# Patient Record
Sex: Female | Born: 1965
Health system: Southern US, Community
[De-identification: ages and names within clinical notes are randomized; demographics above are authoritative.]

## PROBLEM LIST (undated history)

## (undated) DIAGNOSIS — N289 Disorder of kidney and ureter, unspecified: Secondary | ICD-10-CM

## (undated) DIAGNOSIS — G709 Myoneural disorder, unspecified: Secondary | ICD-10-CM

## (undated) DIAGNOSIS — R5383 Other fatigue: Secondary | ICD-10-CM

## (undated) DIAGNOSIS — M797 Fibromyalgia: Secondary | ICD-10-CM

## (undated) DIAGNOSIS — I10 Essential (primary) hypertension: Secondary | ICD-10-CM

## (undated) DIAGNOSIS — Z91018 Allergy to other foods: Secondary | ICD-10-CM

## (undated) DIAGNOSIS — F32A Depression, unspecified: Secondary | ICD-10-CM

## (undated) DIAGNOSIS — F0390 Unspecified dementia without behavioral disturbance: Secondary | ICD-10-CM

## (undated) DIAGNOSIS — G4730 Sleep apnea, unspecified: Secondary | ICD-10-CM

## (undated) DIAGNOSIS — M1990 Unspecified osteoarthritis, unspecified site: Secondary | ICD-10-CM

## (undated) DIAGNOSIS — M2550 Pain in unspecified joint: Secondary | ICD-10-CM

## (undated) DIAGNOSIS — M069 Rheumatoid arthritis, unspecified: Secondary | ICD-10-CM

## (undated) DIAGNOSIS — F419 Anxiety disorder, unspecified: Secondary | ICD-10-CM

## (undated) DIAGNOSIS — M549 Dorsalgia, unspecified: Secondary | ICD-10-CM

## (undated) HISTORY — DX: Sleep apnea, unspecified: G47.30

## (undated) HISTORY — DX: Pain in unspecified joint: M25.50

## (undated) HISTORY — PX: HAND SURGERY: SHX662

## (undated) HISTORY — DX: Depression, unspecified: F32.A

## (undated) HISTORY — DX: Unspecified osteoarthritis, unspecified site: M19.90

## (undated) HISTORY — DX: Unspecified dementia, unspecified severity, without behavioral disturbance, psychotic disturbance, mood disturbance, and anxiety: F03.90

## (undated) HISTORY — PX: GANGLION CYST EXCISION: SHX1691

## (undated) HISTORY — PX: HIP SURGERY: SHX245

## (undated) HISTORY — DX: Myoneural disorder, unspecified: G70.9

## (undated) HISTORY — PX: GASTRIC BYPASS: SHX52

## (undated) HISTORY — DX: Anxiety disorder, unspecified: F41.9

## (undated) HISTORY — PX: TUBAL LIGATION: SHX77

## (undated) HISTORY — DX: Other fatigue: R53.83

## (undated) HISTORY — DX: Allergy to other foods: Z91.018

## (undated) HISTORY — DX: Dorsalgia, unspecified: M54.9

## (undated) HISTORY — DX: Rheumatoid arthritis, unspecified: M06.9

## (undated) HISTORY — DX: Disorder of kidney and ureter, unspecified: N28.9

## (undated) HISTORY — PX: DILATION AND CURETTAGE OF UTERUS: SHX78

## (undated) HISTORY — DX: Essential (primary) hypertension: I10

---

## 2005-03-30 HISTORY — PX: CARPAL TUNNEL RELEASE: SHX101

## 2007-03-31 HISTORY — PX: INCONTINENCE SURGERY: SHX676

## 2011-03-22 ENCOUNTER — Ambulatory Visit: Payer: Self-pay | Admitting: Internal Medicine

## 2011-06-25 ENCOUNTER — Ambulatory Visit: Payer: Self-pay

## 2011-07-29 ENCOUNTER — Ambulatory Visit: Payer: Self-pay

## 2012-01-21 ENCOUNTER — Other Ambulatory Visit: Payer: Self-pay | Admitting: Unknown Physician Specialty

## 2012-01-28 LAB — WOUND CULTURE

## 2012-06-19 ENCOUNTER — Emergency Department: Payer: Self-pay | Admitting: Emergency Medicine

## 2012-06-19 LAB — COMPREHENSIVE METABOLIC PANEL
Albumin: 3.3 g/dL — ABNORMAL LOW (ref 3.4–5.0)
Bilirubin,Total: 0.4 mg/dL (ref 0.2–1.0)
Chloride: 103 mmol/L (ref 98–107)
Creatinine: 0.77 mg/dL (ref 0.60–1.30)
EGFR (Non-African Amer.): >60
Osmolality: 271 (ref 275–301)

## 2012-06-19 LAB — CBC
HCT: 41.1 % (ref 35.0–47.0)
MCH: 29.5 pg (ref 26.0–34.0)
MCHC: 34.2 g/dL (ref 32.0–36.0)
MCV: 86 fL (ref 80–100)
Platelet: 322 10*3/uL (ref 150–440)
RBC: 4.77 10*6/uL (ref 3.80–5.20)
RDW: 13.5 % (ref 11.5–14.5)
WBC: 9.6 10*3/uL (ref 3.6–11.0)

## 2012-06-19 LAB — LIPASE, BLOOD: Lipase: 82 U/L (ref 73–393)

## 2014-09-10 DIAGNOSIS — M150 Primary generalized (osteo)arthritis: Secondary | ICD-10-CM | POA: Insufficient documentation

## 2014-09-10 DIAGNOSIS — Z720 Tobacco use: Secondary | ICD-10-CM

## 2014-09-10 DIAGNOSIS — F17210 Nicotine dependence, cigarettes, uncomplicated: Secondary | ICD-10-CM | POA: Insufficient documentation

## 2014-09-10 DIAGNOSIS — K589 Irritable bowel syndrome without diarrhea: Secondary | ICD-10-CM | POA: Insufficient documentation

## 2014-09-10 DIAGNOSIS — M797 Fibromyalgia: Secondary | ICD-10-CM | POA: Insufficient documentation

## 2014-09-10 DIAGNOSIS — G4733 Obstructive sleep apnea (adult) (pediatric): Secondary | ICD-10-CM | POA: Insufficient documentation

## 2014-09-10 DIAGNOSIS — G894 Chronic pain syndrome: Secondary | ICD-10-CM | POA: Insufficient documentation

## 2014-09-10 DIAGNOSIS — I159 Secondary hypertension, unspecified: Secondary | ICD-10-CM

## 2014-09-10 DIAGNOSIS — I10 Essential (primary) hypertension: Secondary | ICD-10-CM | POA: Insufficient documentation

## 2014-09-10 DIAGNOSIS — F419 Anxiety disorder, unspecified: Secondary | ICD-10-CM | POA: Insufficient documentation

## 2014-09-11 ENCOUNTER — Ambulatory Visit (INDEPENDENT_AMBULATORY_CARE_PROVIDER_SITE_OTHER): Payer: BLUE CROSS/BLUE SHIELD | Admitting: Unknown Physician Specialty

## 2014-09-11 ENCOUNTER — Other Ambulatory Visit: Payer: Self-pay | Admitting: Unknown Physician Specialty

## 2014-09-11 ENCOUNTER — Encounter: Payer: Self-pay | Admitting: Unknown Physician Specialty

## 2014-09-11 VITALS — BP 141/83 | HR 103 | Temp 98.0°F | Ht 69.1 in | Wt 370.0 lb

## 2014-09-11 DIAGNOSIS — Z720 Tobacco use: Secondary | ICD-10-CM

## 2014-09-11 DIAGNOSIS — G4733 Obstructive sleep apnea (adult) (pediatric): Secondary | ICD-10-CM | POA: Diagnosis not present

## 2014-09-11 DIAGNOSIS — M797 Fibromyalgia: Secondary | ICD-10-CM

## 2014-09-11 DIAGNOSIS — R220 Localized swelling, mass and lump, head: Secondary | ICD-10-CM

## 2014-09-11 DIAGNOSIS — G4730 Sleep apnea, unspecified: Secondary | ICD-10-CM

## 2014-09-11 DIAGNOSIS — E668 Other obesity: Secondary | ICD-10-CM

## 2014-09-11 DIAGNOSIS — E669 Obesity, unspecified: Secondary | ICD-10-CM

## 2014-09-11 DIAGNOSIS — R0602 Shortness of breath: Secondary | ICD-10-CM | POA: Diagnosis not present

## 2014-09-11 MED ORDER — CYANOCOBALAMIN 1000 MCG/ML IJ SOLN
1000.0000 ug | Freq: Once | INTRAMUSCULAR | Status: AC
  Administered 2014-09-11: 1000 ug via INTRAMUSCULAR

## 2014-09-11 MED ORDER — CELECOXIB 200 MG PO CAPS: 200.0000 mg | ORAL_CAPSULE | Freq: Two times a day (BID) | ORAL | Status: DC

## 2014-09-11 MED ORDER — ALBUTEROL SULFATE HFA 108 (90 BASE) MCG/ACT IN AERS: 2.0000 | INHALATION_SPRAY | Freq: Four times a day (QID) | RESPIRATORY_TRACT | Status: DC | PRN

## 2014-09-11 NOTE — Assessment & Plan Note (Signed)
Unsuccessful with Chantix.  Quit smoking information given including a free class

## 2014-09-11 NOTE — Patient Instructions (Addendum)
Sleep Apnea  Sleep apnea is a sleep disorder characterized by abnormal pauses in breathing while you sleep. When your breathing pauses, the level of oxygen in your blood decreases. This causes you to move out of deep sleep and into light sleep. As a result, your quality of sleep is poor, and the system that carries your blood throughout your body (cardiovascular system) experiences stress. If sleep apnea remains untreated, the following conditions can develop:  High blood pressure (hypertension).  Coronary artery disease.  Inability to achieve or maintain an erection (impotence).  Impairment of your thought process (cognitive dysfunction). There are three types of sleep apnea:  Obstructive sleep apnea--Pauses in breathing during sleep because of a blocked airway.  Central sleep apnea--Pauses in breathing during sleep because the area of the brain that controls your breathing does not send the correct signals to the muscles that control breathing.  Mixed sleep apnea--A combination of both obstructive and central sleep apnea. RISK FACTORS The following risk factors can increase your risk of developing sleep apnea:  Being overweight.  Smoking.  Having narrow passages in your nose and throat.  Being of older age.  Being female.  Alcohol use.  Sedative and tranquilizer use.  Ethnicity. Among individuals younger than 35 years, African Americans are at increased risk of sleep apnea. SYMPTOMS   Difficulty staying asleep.  Daytime sleepiness and fatigue.  Loss of energy.  Irritability.  Loud, heavy snoring.  Morning headaches.  Trouble concentrating.  Forgetfulness.  Decreased interest in sex. DIAGNOSIS  In order to diagnose sleep apnea, your caregiver will perform a physical examination. Your caregiver may suggest that you take a home sleep test. Your caregiver may also recommend that you spend the night in a sleep lab. In the sleep lab, several monitors record  information about your heart, lungs, and brain while you sleep. Your leg and arm movements and blood oxygen level are also recorded. TREATMENT The following actions may help to resolve mild sleep apnea:  Sleeping on your side.   Using a decongestant if you have nasal congestion.   Avoiding the use of depressants, including alcohol, sedatives, and narcotics.   Losing weight and modifying your diet if you are overweight. There also are devices and treatments to help open your airway:  Oral appliances. These are custom-made mouthpieces that shift your lower jaw forward and slightly open your bite. This opens your airway.  Devices that create positive airway pressure. This positive pressure "splints" your airway open to help you breathe better during sleep. The following devices create positive airway pressure:  Continuous positive airway pressure (CPAP) device. The CPAP device creates a continuous level of air pressure with an air pump. The air is delivered to your airway through a mask while you sleep. This continuous pressure keeps your airway open.  Nasal expiratory positive airway pressure (EPAP) device. The EPAP device creates positive air pressure as you exhale. The device consists of single-use valves, which are inserted into each nostril and held in place by adhesive. The valves create very little resistance when you inhale but create much more resistance when you exhale. That increased resistance creates the positive airway pressure. This positive pressure while you exhale keeps your airway open, making it easier to breath when you inhale again.  Bilevel positive airway pressure (BPAP) device. The BPAP device is used mainly in patients with central sleep apnea. This device is similar to the CPAP device because it also uses an air pump to deliver continuous air pressure  through a mask. However, with the BPAP machine, the pressure is set at two different levels. The pressure when you  exhale is lower than the pressure when you inhale.  Surgery. Typically, surgery is only done if you cannot comply with less invasive treatments or if the less invasive treatments do not improve your condition. Surgery involves removing excess tissue in your airway to create a wider passage way. Document Released: 03/06/2002 Document Revised: 07/11/2012 Document Reviewed: 07/23/2011 [facility] Patient Information 2015 [place], [place]. This information is not intended to replace advice given to you by your health care provider. Make sure you discuss any questions you have with your health care provider. Smoking Cessation Quitting smoking is important to your health and has many advantages. However, it is not always easy to quit since nicotine is a very addictive drug. Oftentimes, people try 3 times or more before being able to quit. This document explains the best ways for you to prepare to quit smoking. Quitting takes hard work and a lot of effort, but you can do it. ADVANTAGES OF QUITTING SMOKING  You will live longer, feel better, and live better.  Your body will feel the impact of quitting smoking almost immediately.  Within 20 minutes, blood pressure decreases. Your pulse returns to its normal level.  After 8 hours, carbon monoxide levels in the blood return to normal. Your oxygen level increases.  After 24 hours, the chance of having a heart attack starts to decrease. Your breath, hair, and body stop smelling like smoke.  After 48 hours, damaged nerve endings begin to recover. Your sense of taste and smell improve.  After 72 hours, the body is virtually free of nicotine. Your bronchial tubes relax and breathing becomes easier.  After 2 to 12 weeks, lungs can hold more air. Exercise becomes easier and circulation improves.  The risk of having a heart attack, stroke, cancer, or lung disease is greatly reduced.  After 1 year, the risk of coronary heart disease is cut in half.  After 5  years, the risk of stroke falls to the same as a nonsmoker.  After 10 years, the risk of lung cancer is cut in half and the risk of other cancers decreases significantly.  After 15 years, the risk of coronary heart disease drops, usually to the level of a nonsmoker.  If you are pregnant, quitting smoking will improve your chances of having a healthy baby.  The people you live with, especially any children, will be healthier.  You will have extra money to spend on things other than cigarettes. QUESTIONS TO THINK ABOUT BEFORE ATTEMPTING TO QUIT You may want to talk about your answers with your health care provider.  Why do you want to quit?  If you tried to quit in the past, what helped and what did not?  What will be the most difficult situations for you after you quit? How will you plan to handle them?  Who can help you through the tough times? Your family? Friends? A health care provider?  What pleasures do you get from smoking? What ways can you still get pleasure if you quit? Here are some questions to ask your health care provider:  How can you help me to be successful at quitting?  What medicine do you think would be best for me and how should I take it?  What should I do if I need more help?  What is smoking withdrawal like? How can I get information on withdrawal? GET READY  Set a quit date.  Change your environment by getting rid of all cigarettes, ashtrays, matches, and lighters in your home, car, or work. Do not let people smoke in your home.  Review your past attempts to quit. Think about what worked and what did not. GET SUPPORT AND ENCOURAGEMENT You have a better chance of being successful if you have help. You can get support in many ways.  Tell your family, friends, and coworkers that you are going to quit and need their support. Ask them not to smoke around you.  Get individual, group, or telephone counseling and support. Programs are available at [employer] and health centers. Call your local health department for information about programs in your area.  Spiritual beliefs and practices may help some smokers quit.  Download a "quit meter" on your computer to keep track of quit statistics, such as how long you have gone without smoking, cigarettes not smoked, and money saved.  Get a self-help book about quitting smoking and staying off tobacco. [place] yourself from urges to smoke. Talk to someone, go for a walk, or occupy your time with a task.  Change your normal routine. Take a different route to work. Drink tea instead of coffee. Eat breakfast in a different place.  Reduce your stress. Take a hot bath, exercise, or read a book.  Plan something enjoyable to do every day. Reward yourself for not smoking.  Explore interactive web-based programs that specialize in helping you quit. GET MEDICINE AND USE IT CORRECTLY Medicines can help you stop smoking and decrease the urge to smoke. Combining medicine with the above behavioral methods and support can greatly increase your chances of successfully quitting smoking.  Nicotine replacement therapy helps deliver nicotine to your body without the negative effects and risks of smoking. Nicotine replacement therapy includes nicotine gum, lozenges, inhalers, nasal sprays, and skin patches. Some may be available over-the-counter and others require a prescription.  Antidepressant medicine helps people abstain from smoking, but how this works is unknown. This medicine is available by prescription.  Nicotinic receptor partial agonist medicine simulates the effect of nicotine in your brain. This medicine is available by prescription. Ask your health care provider for advice about which medicines to use and how to use them based on your health history. Your health care provider will tell you what side effects to look out for if you choose to be on a medicine or  therapy. Carefully read the information on the package. Do not use any other product containing nicotine while using a nicotine replacement product.  RELAPSE OR DIFFICULT SITUATIONS Most relapses occur within the first 3 months after quitting. Do not be discouraged if you start smoking again. Remember, most people try several times before finally quitting. You may have symptoms of withdrawal because your body is used to nicotine. You may crave cigarettes, be irritable, feel very hungry, cough often, get headaches, or have difficulty concentrating. The withdrawal symptoms are only temporary. They are strongest when you first quit, but they will go away within 10-14 days. To reduce the chances of relapse, try to:  Avoid drinking alcohol. Drinking lowers your chances of successfully quitting.  Reduce the amount of caffeine you consume. Once you quit smoking, the amount of caffeine in your body increases and can give you symptoms, such as a rapid heartbeat, sweating, and anxiety.  Avoid smokers because they can make you want to smoke.  Do not let weight gain distract  you. Many smokers will gain weight when they quit, usually less than 10 pounds. Eat a healthy diet and stay active. You can always lose the weight gained after you quit.  Find ways to improve your mood other than smoking. FOR MORE INFORMATION  www.smokefree.gov  Document Released: 03/10/2001 Document Revised: 07/31/2013 Document Reviewed: 06/25/2011 [facility] Patient Information 2015 [place], [place]. This information is not intended to replace advice given to you by your health care provider. Make sure you discuss any questions you have with your health care provider.  We are referring you to ENT, Bariatric surgery, and for a sleep study

## 2014-09-11 NOTE — Assessment & Plan Note (Signed)
Never did get her sleep study due to expense.  Discussed with patient this is necessary, especially with extent of symptms

## 2014-09-11 NOTE — Assessment & Plan Note (Signed)
Referral given to bariatric surgery

## 2014-09-11 NOTE — Assessment & Plan Note (Addendum)
Stable, but out of Celebrex.  Will refill.  Encouraged to only take one a day

## 2014-09-11 NOTE — Progress Notes (Signed)
BP 141/83 mmHg  Pulse 103  Temp(Src) 98 F (36.7 C)  Ht 5' 9.1" (1.755 m)  Wt 370 lb (167.831 kg)  BMI 54.49 kg/m2  SpO2 95%  LMP  (LMP Unknown)   Subjective:    Patient ID: Cathy Mitchell, female    DOB: 1965/11/18, 49 y.o.   MRN: [id number]  HPI: Cathy Mitchell is a 49 y.o. female  Chief Complaint  Patient presents with  . Mass    pt states lump is on the left side of her face  . Sore Throat    pt states throat was not very sore, but it "collapsed" in the middle of the ight to where she couldn't breathe.   Pt states she has a "sebaceous cyst" on the left side of her face.  States it has been there for about a week and quickly getting bigger.    Denies pain.    Throat collapse - Pt states she woke up in the middle of the night and felt her throat "collapse"  She is a stomach sleeper.  She does snore at night.  Does not feel rested in the morning.  She does sit herself up at night so her throat doesn't collapse.    Fibromyalgia is stable except "tornado season."  She is our of Cymbalta which seems to help with her problem above.    Nicotine Dependence Presents for follow-up (Chantix helps.  But can't remember to take the second pill) visit. Her urge triggers include company of smokers.     Relevant past medical, surgical, family and social history reviewed and updated as indicated. Interim medical history since our last visit reviewed. Allergies and medications reviewed and updated.  Review of Systems  Constitutional: Negative.   HENT: Positive for trouble swallowing.   Respiratory: Positive for apnea, choking, shortness of breath and wheezing.        "I know smoking is killing me"  Cardiovascular: Negative.   Gastrointestinal: Negative.   Endocrine: Negative.   Genitourinary: Negative.     Per HPI unless specifically indicated above     Objective:    BP 141/83 mmHg  Pulse 103  Temp(Src) 98 F (36.7 C)  Ht 5' 9.1" (1.755 m)  Wt 370 lb (167.831 kg)  BMI  54.49 kg/m2  SpO2 95%  LMP  (LMP Unknown)  Wt Readings from Last 3 Encounters:  09/11/14 370 lb (167.831 kg)  08/29/13 352 lb (159.666 kg)    Physical Exam  Constitutional: She is oriented to person, place, and time. She appears well-developed and well-nourished. No distress.  HENT:  Head: Normocephalic and atraumatic.  I can appreciate a dime sized area that is fixed and soft on left side of face  Eyes: Conjunctivae and lids are normal. Right eye exhibits no discharge. Left eye exhibits no discharge. No scleral icterus.  Cardiovascular: Normal rate and regular rhythm.   Pulmonary/Chest: Effort normal. No respiratory distress. She has wheezes.  Generalized wheezing throughout  Abdominal: Normal appearance and bowel sounds are normal. She exhibits no distension. There is no splenomegaly or hepatomegaly. There is no tenderness.  Musculoskeletal: Normal range of motion.  Neurological: She is alert and oriented to person, place, and time.  Skin: Skin is intact. No rash noted. No pallor.  Psychiatric: She has a normal mood and affect. Her behavior is normal. Judgment and thought content normal.        Assessment & Plan:   Problem List Items Addressed This Visit  Respiratory   Obstructive sleep apnea    Never did get her sleep study due to expense.  Discussed with patient this is necessary, especially with extent of symptms        Musculoskeletal and Integument   Fibromyalgia - Primary    Stable, but out of Celebrex.  Will refill.  Encouraged to only take one a day      Relevant Medications   cyanocobalamin ((VITAMIN B-12)) injection 1,000 mcg (Completed)   celecoxib (CELEBREX) 200 MG capsule     Other   Tobacco abuse    Unsuccessful with Chantix.  Quit smoking information given including a free class      Relevant Orders   Spirometry with Graph   Sleep apnea   Relevant Orders   Nocturnal polysomnography (NPSG)   Extreme obesity    Referral given to bariatric  surgery      Relevant Orders   Ambulatory referral to General Surgery    Other Visit Diagnoses    Swelling, mass, or lump on face        Refer to ENT for managment due to location of area.      Relevant Orders    Nocturnal polysomnography (NPSG)    Ambulatory referral to ENT    SOB (shortness of breath)        With generalized wheezing.  spirometry normal.  Give Ventolin inhaler for wheezing.      Relevant Orders    Spirometry with Graph        Follow up plan: No Follow-up on file.

## 2014-10-15 ENCOUNTER — Ambulatory Visit (INDEPENDENT_AMBULATORY_CARE_PROVIDER_SITE_OTHER): Payer: BLUE CROSS/BLUE SHIELD | Admitting: Unknown Physician Specialty

## 2014-10-15 ENCOUNTER — Encounter: Payer: Self-pay | Admitting: Unknown Physician Specialty

## 2014-10-15 VITALS — BP 142/85 | HR 88 | Temp 97.7°F | Ht 68.6 in | Wt 368.0 lb

## 2014-10-15 DIAGNOSIS — N181 Chronic kidney disease, stage 1: Secondary | ICD-10-CM | POA: Diagnosis not present

## 2014-10-15 DIAGNOSIS — M797 Fibromyalgia: Secondary | ICD-10-CM

## 2014-10-15 DIAGNOSIS — I10 Essential (primary) hypertension: Secondary | ICD-10-CM | POA: Diagnosis not present

## 2014-10-15 DIAGNOSIS — Z5181 Encounter for therapeutic drug level monitoring: Secondary | ICD-10-CM

## 2014-10-15 DIAGNOSIS — R809 Proteinuria, unspecified: Secondary | ICD-10-CM | POA: Diagnosis not present

## 2014-10-15 DIAGNOSIS — R7301 Impaired fasting glucose: Secondary | ICD-10-CM | POA: Diagnosis not present

## 2014-10-15 DIAGNOSIS — E668 Other obesity: Secondary | ICD-10-CM

## 2014-10-15 LAB — MICROALBUMIN, URINE WAIVED
CREATININE, URINE WAIVED: 300 mg/dL (ref 10–300)
Microalb, Ur Waived: 80 mg/L — ABNORMAL HIGH (ref 0–19)

## 2014-10-15 LAB — BAYER DCA HB A1C WAIVED: HB A1C (BAYER DCA - WAIVED): 5.7 % (ref <7.0)

## 2014-10-15 NOTE — Assessment & Plan Note (Addendum)
Discussed microalbumin of 80.  Creatnine pending.  Discussed NSAID use.  Given handout on "alternatives to chronic pain."

## 2014-10-15 NOTE — Progress Notes (Signed)
BP 142/85 mmHg  Pulse 88  Temp(Src) 97.7 F (36.5 C)  Ht 5' 8.6" (1.742 m)  Wt 368 lb (166.924 kg)  BMI 55.01 kg/m2  SpO2 96%   Subjective:    Patient ID: Cathy Mitchell, female    DOB: 04-26-1965, 49 y.o.   MRN: [id number]  HPI: Cathy Mitchell is a 49 y.o. female  Chief Complaint  Patient presents with  . Fibromyalgia    Relevant past medical, surgical, family and social history reviewed and updated as indicated. Interim medical history since our last visit reviewed. Allergies and medications reviewed and updated.  Obesity Pt is concerned she might have diabetes.  She is getting headaches and started drinking "pop." Her weight is up at 368.  She did have a blood sugar of 112 last visit.  She is trying to continue with her organic diet but states she is "half and half."  She finds that her organic diet is lacking of variety and flavor.    Fibromyalgia Stable for now.  She is on chonic NSAIDS and needs CMP.  She does nave chronic fatigue.    IBS This is dependent on diet.    Review of Systems  Per HPI unless specifically indicated above     Objective:    BP 142/85 mmHg  Pulse 88  Temp(Src) 97.7 F (36.5 C)  Ht 5' 8.6" (1.742 m)  Wt 368 lb (166.924 kg)  BMI 55.01 kg/m2  SpO2 96%  Wt Readings from Last 3 Encounters:  10/15/14 368 lb (166.924 kg)  09/11/14 370 lb (167.831 kg)  08/29/13 352 lb (159.666 kg)    Physical Exam  Constitutional: She is oriented to person, place, and time. She appears well-developed and well-nourished. No distress.  HENT:  Head: Normocephalic and atraumatic.  Eyes: Conjunctivae and lids are normal. Right eye exhibits no discharge. Left eye exhibits no discharge. No scleral icterus.  Cardiovascular: Normal rate, regular rhythm and normal heart sounds.   Pulmonary/Chest: Effort normal and breath sounds normal. No respiratory distress.  Abdominal: Normal appearance. There is no splenomegaly or hepatomegaly.  Musculoskeletal: Normal  range of motion.  Neurological: She is alert and oriented to person, place, and time.  Skin: Skin is intact. No rash noted. No pallor.  Psychiatric: She has a normal mood and affect. Her behavior is normal. Judgment and thought content normal.     Assessment & Plan:   Problem List Items Addressed This Visit      Cardiovascular and Mediastinum   High blood pressure     Musculoskeletal and Integument   Fibromyalgia - Primary    Stable for now.  Taking chronic NSAIDS.  Check CMP        Genitourinary   Chronic kidney disease, stage 1, normal or increased GFR    Discussed microalbumin of 80.  Creatnine pending.  Discussed NSAID use.  Given handout on "alternatives to chronic pain."        Other   Extreme obesity    Appointment with bariatric surgeon.  Check Glucose and Hgb A1C.        Relevant Orders   Bayer Dexter Hb A1c Waived    Other Visit Diagnoses    Impaired fasting glucose        Hgb A1C is 5.7.  Discussed with patient.      Relevant Orders    Bayer DCA Hb A1c Waived    Medication monitoring encounter        Relevant Orders  Comprehensive metabolic panel    Microalbumin, Urine Waived    Microalbuminuria        discussed NSAID use.           Follow up plan: Return in about 2 months (around 12/16/2014) for for PE .

## 2014-10-15 NOTE — Assessment & Plan Note (Signed)
Appointment with bariatric surgeon.  Check Glucose and Hgb A1C.

## 2014-10-15 NOTE — Patient Instructions (Signed)
f you have chronic pain and are looking for alternatives to medication and surgery, you have a lot of options.   However, not all alternative pain treatments work. Some can even be risky. Some alternative treatments may help with pain from bad backs, osteoarthritis, and headaches, but have no effect on chronic pain from fibromyalgia or diabetic nerve damage.  Here's a rundown of the most commonly used alternative treatments for chronic pain.  Acupuncture. Once seen as bizarre, acupuncture is rapidly becoming a mainstream treatment for pain. Studies have found that it works for pain caused by many conditions, including fibromyalgia, osteoarthritis, back injuries, and sports injuries. How does it work? No one's quite sure. It could release pain-numbing chemicals in the body. Or it might block the pain signals coming from the nerves.  Exercise. Motion is lotion Going for a walk or swim isn't a treatment, exactly. But regular physical activity has big benefits for people with many different painful conditions. Study after study has found that physical activity can help relieve chronic pain, as well as boost energy and mood.   This is particularly true of pain related to arthritis .    Chiropractic manipulation. Although mainstream medicine has traditionally regarded spinal manipulation with suspicion, it's becoming a more accepted treatment.  I think many people respond will th chiropractic treatment.    Supplements and vitamins. There is evidence that certain dietary supplements and vitamins can help with certain types of pain. Fish oil and flaxseed oil is often used to reduce pain associated with swelling. Topical capsaicin, derived from chili peppers, may help with arthritis, diabetic nerve pain, and other conditions. There's evidence that glucosamine can help relieve moderate to severe pain from osteoarthritis in the knee.  A spice Tumeric is used my many to treat chronic pain.  Curcumin is the active  ingredient and thought to have anti-inflammatory effects and reduces pain and stiffness.  This can be found as capsules or extract (more likely to be free of contaminants). For OA: Capsule, typically 400 mg to 600 mg, three times per day; or 0.5 g to 1 g of powdered root up to 3 g per day. For RA: 500 mg twice daily.  It's best absorbed if it contains black pepper.  Often tart cherry juice works.    But when it comes to supplements, you have to be careful. High doses of B6 can damage the nerves.   Some studies suggest that supplements such as ginkgo biloba and ginseng can thin the blood and increase the risk of bleeding. This could lead to serious consequences for anyone getting surgery for chronic pain.  Finally, supplements don't always contain what they claim as supplement manufacturers are not regulated by the FDA.  I get very confused with the thousands of supplements available and which to choose.  Brands to consider: [employer], [employer], [employer], [employer], [employer], Duwayne Heck and Tresa Garter   I usually go on [employer] and look for the highly rated products.  [employer] or [employer] are great resources and have done the research for you.     Cognitive Behavioral Therapy. Some people with chronic pain balk at the idea of seeing a therapist -- they think it implies that their pain isn't real. But studies show that depression and chronic pain often go together. Chronic pain can cause or worsen depression; depression can lower a person's tolerance for pain.  Stress-reduction techniques. There are number of approaches, including: Yoga. There's good evidence that  yoga can help with chronic pain,  specifically fibromyalgia, neck pain, back pain, and arthritis. Relaxation therapy. This is actually a category of techniques that help people calm the body and release tension -- a process that might also reduce pain. Some approaches teach people how to focus on their breathing.  Research shows that relaxation therapy can help with fibromyalgia, headache, osteoarthritis, and other conditions. Hypnosis. Studies have found this approach helpful with different sorts of pain, like back pain, repetitive strain injuries, and cancer pain. Guided imagery. Research shows that guided imagery can help with conditions like headache pain, cancer pain, osteoarthritis, and fibromyalgia. How does it work? An expert would teach you ways to direct your thoughts by focusing on specific images. Music therapy. This approach gets people to either perform or listen to music. Studies have found that it can help with many different pain conditions, like osteoarthritis and cancer pain. Biofeedback. This approach teaches you how to control normally unconscious bodily functions, like blood pressure or your heart rate. Studies have found that it can help with headaches, fibromyalgia, and other conditions. Massage. It's undeniably relaxing. And there's some evidence that massage can help ease pain from rheumatoid arthritis, neck and back injuries, and fibromyalgia.  Risky Alternative Pain Treatments Experts say you should keep your expectations for alternative pain treatments modest -- especially when it comes to "miracle cures." Controlling chronic pain is not simple. A single supplement, device, or treatment is not going to make your chronic pain disappear. You a need to be suspicious of anyone pushing a treatment when the financial motive is blatant. That doesn't only apply to pain treatments advertised on dubious web sites asking for your credit card number.  Experts say that you should try to keep up to date with research into alternative treatments for chronic pain. The options for people with chronic pain are always growing -- and some of the odder treatments of today might become the mainstream treatments of tomorrow.

## 2014-10-15 NOTE — Assessment & Plan Note (Signed)
Stable for now.  Taking chronic NSAIDS.  Check CMP

## 2014-10-16 LAB — COMPREHENSIVE METABOLIC PANEL
A/G RATIO: 1.3 (ref 1.1–2.5)
ALK PHOS: 78 IU/L (ref 39–117)
ALT: 32 IU/L (ref 0–32)
AST: 21 IU/L (ref 0–40)
Albumin: 3.8 g/dL (ref 3.5–5.5)
BUN/Creatinine Ratio: 21 (ref 9–23)
BUN: 13 mg/dL (ref 6–24)
Bilirubin Total: 0.2 mg/dL (ref 0.0–1.2)
CO2: 21 mmol/L (ref 18–29)
CREATININE: 0.63 mg/dL (ref 0.57–1.00)
Calcium: 8.8 mg/dL (ref 8.7–10.2)
Chloride: 100 mmol/L (ref 97–108)
GFR calc non Af Amer: 107 mL/min/{1.73_m2} (ref >59)
GFR, EST AFRICAN AMERICAN: 124 mL/min/{1.73_m2} (ref >59)
GLOBULIN, TOTAL: 3 g/dL (ref 1.5–4.5)
Glucose: 115 mg/dL — ABNORMAL HIGH (ref 65–99)
POTASSIUM: 4.3 mmol/L (ref 3.5–5.2)
Sodium: 138 mmol/L (ref 134–144)
Total Protein: 6.8 g/dL (ref 6.0–8.5)

## 2014-10-24 ENCOUNTER — Other Ambulatory Visit: Payer: Self-pay | Admitting: Bariatrics

## 2014-11-08 ENCOUNTER — Ambulatory Visit
Admission: RE | Admit: 2014-11-08 | Discharge: 2014-11-08 | Disposition: A | Discharge status: Home / Self Care | Payer: BLUE CROSS/BLUE SHIELD | Source: Ambulatory Visit | Attending: Bariatrics | Admitting: Bariatrics

## 2014-11-08 ENCOUNTER — Other Ambulatory Visit: Payer: Self-pay

## 2014-11-08 DIAGNOSIS — K760 Fatty (change of) liver, not elsewhere classified: Secondary | ICD-10-CM | POA: Insufficient documentation

## 2014-12-03 ENCOUNTER — Encounter: Payer: Self-pay | Admitting: *Deleted

## 2014-12-03 ENCOUNTER — Other Ambulatory Visit: Payer: Self-pay

## 2014-12-03 ENCOUNTER — Emergency Department: Payer: BLUE CROSS/BLUE SHIELD

## 2014-12-03 ENCOUNTER — Observation Stay
Admission: EM | Admit: 2014-12-03 | Discharge: 2014-12-04 | Disposition: A | Discharge status: Home / Self Care | Payer: BLUE CROSS/BLUE SHIELD | Attending: Internal Medicine | Admitting: Internal Medicine

## 2014-12-03 DIAGNOSIS — G709 Myoneural disorder, unspecified: Secondary | ICD-10-CM | POA: Diagnosis not present

## 2014-12-03 DIAGNOSIS — R4781 Slurred speech: Secondary | ICD-10-CM | POA: Insufficient documentation

## 2014-12-03 DIAGNOSIS — K589 Irritable bowel syndrome without diarrhea: Secondary | ICD-10-CM | POA: Insufficient documentation

## 2014-12-03 DIAGNOSIS — I129 Hypertensive chronic kidney disease with stage 1 through stage 4 chronic kidney disease, or unspecified chronic kidney disease: Secondary | ICD-10-CM | POA: Diagnosis not present

## 2014-12-03 DIAGNOSIS — M1991 Primary osteoarthritis, unspecified site: Secondary | ICD-10-CM | POA: Diagnosis not present

## 2014-12-03 DIAGNOSIS — Z79899 Other long term (current) drug therapy: Secondary | ICD-10-CM | POA: Insufficient documentation

## 2014-12-03 DIAGNOSIS — G894 Chronic pain syndrome: Secondary | ICD-10-CM | POA: Diagnosis not present

## 2014-12-03 DIAGNOSIS — M797 Fibromyalgia: Secondary | ICD-10-CM | POA: Insufficient documentation

## 2014-12-03 DIAGNOSIS — E669 Obesity, unspecified: Secondary | ICD-10-CM | POA: Diagnosis not present

## 2014-12-03 DIAGNOSIS — G4733 Obstructive sleep apnea (adult) (pediatric): Secondary | ICD-10-CM | POA: Diagnosis not present

## 2014-12-03 DIAGNOSIS — N181 Chronic kidney disease, stage 1: Secondary | ICD-10-CM | POA: Insufficient documentation

## 2014-12-03 DIAGNOSIS — F17210 Nicotine dependence, cigarettes, uncomplicated: Secondary | ICD-10-CM | POA: Insufficient documentation

## 2014-12-03 DIAGNOSIS — F419 Anxiety disorder, unspecified: Secondary | ICD-10-CM | POA: Diagnosis not present

## 2014-12-03 DIAGNOSIS — I639 Cerebral infarction, unspecified: Secondary | ICD-10-CM

## 2014-12-03 DIAGNOSIS — R200 Anesthesia of skin: Secondary | ICD-10-CM | POA: Diagnosis not present

## 2014-12-03 DIAGNOSIS — G459 Transient cerebral ischemic attack, unspecified: Secondary | ICD-10-CM

## 2014-12-03 LAB — CBC
HEMATOCRIT: 43.1 % (ref 35.0–47.0)
HEMOGLOBIN: 14.5 g/dL (ref 12.0–16.0)
MCH: 29.1 pg (ref 26.0–34.0)
MCHC: 33.7 g/dL (ref 32.0–36.0)
MCV: 86.5 fL (ref 80.0–100.0)
Platelets: 334 10*3/uL (ref 150–440)
RBC: 4.99 MIL/uL (ref 3.80–5.20)
RDW: 13.7 % (ref 11.5–14.5)
WBC: 12.2 10*3/uL — AB (ref 3.6–11.0)

## 2014-12-03 LAB — COMPREHENSIVE METABOLIC PANEL
ALK PHOS: 66 U/L (ref 38–126)
ALT: 26 U/L (ref 14–54)
AST: 25 U/L (ref 15–41)
Albumin: 3.7 g/dL (ref 3.5–5.0)
Anion gap: 9 (ref 5–15)
BILIRUBIN TOTAL: 0.4 mg/dL (ref 0.3–1.2)
BUN: 16 mg/dL (ref 6–20)
CALCIUM: 8.6 mg/dL — AB (ref 8.9–10.3)
CHLORIDE: 98 mmol/L — AB (ref 101–111)
CO2: 25 mmol/L (ref 22–32)
CREATININE: 0.72 mg/dL (ref 0.44–1.00)
Glucose, Bld: 118 mg/dL — ABNORMAL HIGH (ref 65–99)
Potassium: 3.8 mmol/L (ref 3.5–5.1)
Sodium: 132 mmol/L — ABNORMAL LOW (ref 135–145)
TOTAL PROTEIN: 7.4 g/dL (ref 6.5–8.1)

## 2014-12-03 LAB — APTT: APTT: 32 s (ref 24–36)

## 2014-12-03 LAB — DIFFERENTIAL
BASOS ABS: 0 10*3/uL (ref 0–0.1)
Basophils Relative: 0 %
Eosinophils Absolute: 0.4 10*3/uL (ref 0–0.7)
Eosinophils Relative: 3 %
LYMPHS ABS: 2.7 10*3/uL (ref 1.0–3.6)
LYMPHS PCT: 22 %
MONO ABS: 0.8 10*3/uL (ref 0.2–0.9)
MONOS PCT: 6 %
NEUTROS ABS: 8.4 10*3/uL — AB (ref 1.4–6.5)
Neutrophils Relative %: 69 %

## 2014-12-03 LAB — LIPID PANEL
Cholesterol: 146 mg/dL (ref 0–200)
HDL: 34 mg/dL — AB (ref >40)
LDL Cholesterol: 85 mg/dL (ref 0–99)
Total CHOL/HDL Ratio: 4.3 RATIO
Triglycerides: 135 mg/dL (ref <150)
VLDL: 27 mg/dL (ref 0–40)

## 2014-12-03 LAB — PROTIME-INR
INR: 0.97
Prothrombin Time: 13.1 seconds (ref 11.4–15.0)

## 2014-12-03 LAB — TROPONIN I

## 2014-12-03 LAB — HCG, QUANTITATIVE, PREGNANCY: HCG, BETA CHAIN, QUANT, S: 1 m[IU]/mL (ref <5)

## 2014-12-03 MED ORDER — ASPIRIN 325 MG PO TABS: 325.0000 mg | ORAL_TABLET | Freq: Every day | ORAL | Status: DC

## 2014-12-03 MED ORDER — ASPIRIN 325 MG PO TABS
325.0000 mg | ORAL_TABLET | Freq: Every day | ORAL | Status: DC
  Administered 2014-12-03: 18:00:00 325 mg via ORAL
  Filled 2014-12-03 (×2): qty 1

## 2014-12-03 MED ORDER — ALBUTEROL SULFATE (2.5 MG/3ML) 0.083% IN NEBU: 2.5000 mg | INHALATION_SOLUTION | Freq: Four times a day (QID) | RESPIRATORY_TRACT | Status: DC | PRN

## 2014-12-03 MED ORDER — INFLUENZA VAC SPLIT QUAD 0.5 ML IM SUSY: 0.5000 mL | PREFILLED_SYRINGE | INTRAMUSCULAR | Status: DC

## 2014-12-03 MED ORDER — SODIUM CHLORIDE 0.9 % IV SOLN
INTRAVENOUS | Status: DC
  Administered 2014-12-03 – 2014-12-04 (×2): via INTRAVENOUS

## 2014-12-03 MED ORDER — ASPIRIN 81 MG PO CHEW
CHEWABLE_TABLET | ORAL | Status: AC
  Filled 2014-12-03: qty 4

## 2014-12-03 MED ORDER — LOSARTAN POTASSIUM 50 MG PO TABS
50.0000 mg | ORAL_TABLET | Freq: Every day | ORAL | Status: DC
  Filled 2014-12-03: qty 1

## 2014-12-03 MED ORDER — VARENICLINE TARTRATE 0.5 MG PO TABS
0.5000 mg | ORAL_TABLET | Freq: Two times a day (BID) | ORAL | Status: DC
  Administered 2014-12-03: 0.5 mg via ORAL
  Filled 2014-12-03 (×4): qty 1

## 2014-12-03 MED ORDER — ASPIRIN 81 MG PO CHEW
324.0000 mg | CHEWABLE_TABLET | Freq: Once | ORAL | Status: AC
  Administered 2014-12-03: 324 mg via ORAL

## 2014-12-03 MED ORDER — LORAZEPAM 2 MG/ML IJ SOLN
1.0000 mg | Freq: Once | INTRAMUSCULAR | Status: DC
  Filled 2014-12-03: qty 1

## 2014-12-03 MED ORDER — CELECOXIB 200 MG PO CAPS
200.0000 mg | ORAL_CAPSULE | Freq: Two times a day (BID) | ORAL | Status: DC
  Filled 2014-12-03 (×3): qty 1

## 2014-12-03 MED ORDER — ALBUTEROL SULFATE HFA 108 (90 BASE) MCG/ACT IN AERS: 2.0000 | INHALATION_SPRAY | Freq: Four times a day (QID) | RESPIRATORY_TRACT | Status: DC | PRN

## 2014-12-03 NOTE — H&P (Signed)
[facility] [place] NAME: Cathy Mitchell    MR#:  [id number]  DATE OF BIRTH:  07-Oct-1965  DATE OF ADMISSION:  12/03/2014  PRIMARY CARE PHYSICIAN: Kathrine Haddock, NP   REQUESTING/REFERRING PHYSICIAN: Dr. Marcelene Butte  CHIEF COMPLAINT:  Numbness on the right side HISTORY OF PRESENT ILLNESS:  Cathy Mitchell  is a 49 y.o. female with a known history of fibromyalgia and essential hypertension who presents with above complaint. Patient reports that she was eating lunch this morning when suddenly she had intense [place] numbness and pain of her right arm that shot through her leg she then felt lightheaded and dizzy. This happened approximate 2 times on the third time she laid her head down on the counter and it happened again where she had this intense pain in her right arm shooting to her leg with nausea vomiting and feeling of lightheadedness. The lightheadedness and disease was worse show she came to the ER for further evaluation. In the ER apparently a code stroke was called. The neurologist on-call was consulted. She underwent a CT scan of the head which was negative for acute stroke. Her symptoms have quite resolved. It does not appear the patient has an acute stroke at this time. Patient was deemed not a candidate for TPA.  PAST MEDICAL HISTORY:   Past Medical History  Diagnosis Date  . Sleep apnea   . Hypertension   . Anxiety   . Neuromuscular disorder     PAST SURGICAL HISTORY:   Past Surgical History  Procedure Laterality Date  . Tubal ligation    . Dilation and curettage of uterus    . Ganglion cyst excision Bilateral   . Hand surgery Bilateral Resection    SOCIAL HISTORY:   Social History  Substance Use Topics  . Smoking status: Current Every Day Smoker -- 1.00 packs/day    Types: Cigarettes  . Smokeless tobacco: Never Used  . Alcohol Use: No    FAMILY HISTORY:   Family History  Problem Relation Age of Onset  . COPD Father    . Thyroid disease Daughter   . Eczema Son   . Heart disease Maternal Grandfather   . Diabetes Paternal Grandfather   . Heart disease Paternal Grandfather     DRUG ALLERGIES:   Allergies  Allergen Reactions  . Percocet [Oxycodone-Acetaminophen]      REVIEW OF SYSTEMS:  CONSTITUTIONAL: No fever, fatigue or weakness.  EYES: No blurred or double vision.  EARS, NOSE, AND THROAT: No tinnitus or ear pain.  RESPIRATORY: No cough, shortness of breath, wheezing or hemoptysis.  CARDIOVASCULAR: No chest pain, orthopnea, edema.  GASTROINTESTINAL: No nausea, vomiting, diarrhea or abdominal pain.  GENITOURINARY: No dysuria, hematuria.  ENDOCRINE: No polyuria, nocturia,  HEMATOLOGY: No anemia, easy bruising or bleeding SKIN: No rash or lesion. MUSCULOSKELETAL: No joint pain or arthritis.   positive fibromyalgia NEUROLOGIC as stated above  PSYCHIATRY: No anxiety or depression.   MEDICATIONS AT HOME:   Prior to Admission medications   Medication Sig Start Date End Date Taking? Authorizing Provider  albuterol (PROVENTIL HFA;VENTOLIN HFA) 108 (90 BASE) MCG/ACT inhaler Inhale 2 puffs into the lungs every 6 (six) hours as needed for wheezing or shortness of breath. 09/11/14   Kathrine Haddock, NP  celecoxib (CELEBREX) 200 MG capsule Take 1 capsule (200 mg total) by mouth 2 (two) times daily. 09/11/14   Kathrine Haddock, NP  losartan (COZAAR) 50 MG tablet Take 50 mg by mouth daily.  Historical Provider, MD  varenicline (CHANTIX) 0.5 MG tablet Take 0.5 mg by mouth 2 (two) times daily.    Historical Provider, MD      VITAL SIGNS:  Blood pressure 128/77, pulse 81, temperature 98.7 F (37.1 C), temperature source Oral, resp. rate 16, height 5\' 9"  (1.753 m), weight 164.656 kg (363 lb), SpO2 98 %.  PHYSICAL EXAMINATION:  GENERAL:  49 y.o.-year-old morbidly obese  patient lying in the bed with no acute distress.  EYES: Pupils equal, round, reactive to light and accommodation. No scleral icterus.  Extraocular muscles intact.  HEENT: Head atraumatic, normocephalic. Oropharynx clear.  NECK:  Supple, no jugular venous distention. No thyroid enlargement, no tenderness.  LUNGS: Normal breath sounds bilaterally, no wheezing, rales,rhonchi or crepitation. No use of accessory muscles of respiration.  CARDIOVASCULAR: S1, S2 normal. No murmurs, rubs, or gallops.  ABDOMEN: Soft, nontender, nondistended. Bowel sounds present. No organomegaly or mass.  EXTREMITIES: No pedal edema, cyanosis, or clubbing.  NEUROLOGIC: Cranial nerves II through XII are grossly intact. No focal deficits. sensation intact.  PSYCHIATRIC: The patient is alert and oriented x 3.  SKIN: No obvious rash, lesion, or ulcer.   LABORATORY PANEL:   CBC  Recent Labs Lab 12/03/14 1326  WBC 12.2*  HGB 14.5  HCT 43.1  PLT 334   ------------------------------------------------------------------------------------------------------------------  Chemistries   Recent Labs Lab 12/03/14 1326  NA 132*  K 3.8  CL 98*  CO2 25  GLUCOSE 118*  BUN 16  CREATININE 0.72  CALCIUM 8.6*  AST 25  ALT 26  ALKPHOS 66  BILITOT 0.4   ------------------------------------------------------------------------------------------------------------------  Cardiac Enzymes  Recent Labs Lab 12/03/14 1326  TROPONINI <0.03   ------------------------------------------------------------------------------------------------------------------  RADIOLOGY:  Ct Head Wo Contrast  12/03/2014   CLINICAL DATA:  Acute onset of slurred speech, left arm numbness and dizziness.  EXAM: CT HEAD WITHOUT CONTRAST  TECHNIQUE: Contiguous axial images were obtained from the base of the skull through the vertex without intravenous contrast.  COMPARISON:  None.  FINDINGS: Normal appearing cerebral hemispheres and posterior fossa structures. Normal size and position of the ventricles. No intracranial hemorrhage, mass lesion or CT evidence of acute infarction.  Unremarkable bones and included paranasal sinuses.  IMPRESSION: Normal examination.  These results were called by telephone at the time of interpretation on 12/03/2014 at 1:27 pm to Dr. Larae Grooms , who verbally acknowledged these results.   Electronically Signed   By: Claudie Revering M.D.   On: 12/03/2014 13:29    EKG:   normal sinus rhythm and Q waves in inferior leads no ST elevation depression   IMPRESSION AND PLAN:   This is a 49 year old female with fibromyalgia and essential hypertension who presents with numbness in the right side.    1. Numbness of the right side: Patient symptoms have now much improved. Patient's symptoms do not sound typical for acute CVA. However, to be sure I will order an MRI to evaluate for stroke. I will have consulted neurology. Patient will get neuro checks every 4 hours. She is also started on aspirin. If the MRI by chance is positive for stroke she will need to undergo 2-D echocardiogram and carotid Dopplers which should be ordered.   2. Essential hypertension: Continue losartan.  3. Hyponatremia: Likely secondary to mild dehydration. IV fluids have been started.  4. Tobacco dependence: Patient was counseled for 3 minutes regarding anxiety. She is trying to quit. She is on Chantix however she continues to smoke half a pack a  day and therefore encouraged to stop smoking.    All the records are reviewed and case discussed with ED provider. Management plans discussed with the patient and she is in agreement.  CODE STATUS: FULL  TOTAL TIME TAKING CARE OF THIS PATIENT: 45 minutes.    Marwin Primmer M.D on 12/03/2014 at 2:44 PM  Between 7am to 6pm - Pager - [phone] After 6pm go to [email or web address] - password [email or web address] [facility]  Office  [phone]  CC: Primary care physician; Kathrine Haddock, NP

## 2014-12-03 NOTE — ED Notes (Signed)
Pt normal today, 12:15 sudden onset of righ arm weakness, slurred speech, difficulty word finding, repeating words, code stroke called, RN transported pt to CT

## 2014-12-03 NOTE — ED Notes (Signed)
Blood Glucose of 116 upon arrival to ED room 26

## 2014-12-03 NOTE — ED Notes (Signed)
[place] set up and ready in pts room.

## 2014-12-03 NOTE — ED Notes (Addendum)
SOC MD consult on at this time, RN at bedside along with pt's husband

## 2014-12-03 NOTE — ED Provider Notes (Signed)
Time Seen: Approximately patient was seen on arrival after being in the CAT scan area. Patient had a code stroke called from the triage area.  I have reviewed the triage notes  Chief Complaint: Code Stroke   History of Present Illness: Cathy Mitchell is a 49 y.o. female who apparently was at home this afternoon at 12:15 and the husband came in to check on her and she was thought to be unresponsive and was laying with her head on her right arm. Intense stated that she had a "" funny feeling in the right side of her body and had difficulty moving her right arm and right leg. She's had a stuttering speech pattern which is not normal for her. She denies any headaches, neck pain, chest pain. She denies any TPA risk factors such his risk of being pregnant, recent intracranial bleed, or any other contraindications. Patient was transported to the CAT scan area and examination was at the bedside. Preliminary reading from the radiologist states that there is no signs of an intracerebral hemorrhage. No acute abnormalities.   Past Medical History  Diagnosis Date  . Sleep apnea   . Hypertension   . Anxiety   . Neuromuscular disorder     Patient Active Problem List   Diagnosis Date Noted  . Numbness 12/03/2014  . Chronic kidney disease, stage 1, normal or increased GFR 10/15/2014  . Sleep apnea 09/11/2014  . Extreme obesity 09/11/2014  . Primary generalized hypertrophic osteoarthrosis 09/10/2014  . High blood pressure 09/10/2014  . Obstructive sleep apnea 09/10/2014  . Chronic pain syndrome 09/10/2014  . Irritable bowel syndrome 09/10/2014  . Tobacco abuse 09/10/2014  . Acute anxiety 09/10/2014  . Fibromyalgia 09/10/2014    Past Surgical History  Procedure Laterality Date  . Tubal ligation    . Dilation and curettage of uterus    . Ganglion cyst excision Bilateral   . Hand surgery Bilateral Resection    Past Surgical History  Procedure Laterality Date  . Tubal ligation    . Dilation  and curettage of uterus    . Ganglion cyst excision Bilateral   . Hand surgery Bilateral Resection    Current Outpatient Rx  Name  Route  Sig  Dispense  Refill  . albuterol (PROVENTIL HFA;VENTOLIN HFA) 108 (90 BASE) MCG/ACT inhaler   Inhalation   Inhale 2 puffs into the lungs every 6 (six) hours as needed for wheezing or shortness of breath.   1 Inhaler   0   . celecoxib (CELEBREX) 200 MG capsule   Oral   Take 1 capsule (200 mg total) by mouth 2 (two) times daily.   60 capsule   6   . losartan (COZAAR) 50 MG tablet   Oral   Take 50 mg by mouth daily.         . varenicline (CHANTIX) 0.5 MG tablet   Oral   Take 0.5 mg by mouth 2 (two) times daily.           Allergies:  Percocet  Family History: Family History  Problem Relation Age of Onset  . COPD Father   . Thyroid disease Daughter   . Eczema Son   . Heart disease Maternal Grandfather   . Diabetes Paternal Grandfather   . Heart disease Paternal Grandfather     Social History: Social History  Substance Use Topics  . Smoking status: Current Every Day Smoker -- 1.00 packs/day    Types: Cigarettes  . Smokeless tobacco: Never Used  .  Alcohol Use: No     Review of Systems:   10 point review of systems was performed and was otherwise negative:  Constitutional: No fever Eyes: No visual disturbances ENT: No sore throat, ear pain Cardiac: No chest pain Respiratory: No shortness of breath, wheezing, or stridor Abdomen: No abdominal pain, no vomiting, No diarrhea Endocrine: No weight loss, No night sweats Extremities: No peripheral edema, cyanosis Skin: No rashes, easy bruising Neurologic: Difficulty with speech and stuttering and some echolalia. No difficulty swallowing   possible right-sided weakness. Urologic: No dysuria, Hematuria, or urinary frequency   Physical Exam:  ED Triage Vitals  Enc Vitals Group     BP 12/03/14 1319 148/92 mmHg     Pulse Rate 12/03/14 1319 81     Resp 12/03/14 1319 20      Temp 12/03/14 1354 97.8 F (36.6 C)     Temp Source 12/03/14 1354 Oral     SpO2 12/03/14 1319 96 %     Weight 12/03/14 1319 363 lb (164.656 kg)     Height 12/03/14 1319 5\' 9"  (1.753 m)     Head Cir --      Peak Flow --      Pain Score --      Pain Loc --      Pain Edu? --      Excl. in [place]? --     General: Awake , Alert , and Oriented times 3; GCS 15 Head: Normal cephalic , atraumatic Eyes: Pupils equal , round, reactive to light Nose/Throat: No nasal drainage, patent upper airway without erythema or exudate.  Neck: Supple, Full range of motion, No anterior adenopathy or palpable thyroid masses Lungs: Clear to ascultation without wheezes , rhonchi, or rales Heart: Regular rate, regular rhythm without murmurs , gallops , or rubs Abdomen: Soft, non tender without rebound, guarding , or rigidity; bowel sounds positive and symmetric in all 4 quadrants. No organomegaly .        Extremities: 2 plus symmetric pulses. No edema, clubbing or cyanosis Neurologic: Patient exhibits no facial weakness. She has normal mechanism of speech and speaks in a stuttering pattern. No true aphasia patient has some 4 out of 5 strength on the right and is able to grasp with the right hand and has some plantar flexion and extension. He is able to lift her leg up off the stretcher was noted when they're putting in her IV. Left side shows no deficits or weakness Skin: warm, dry, no rashes   Labs:   All laboratory work was reviewed including any pertinent negatives or positives listed below:  Labs Reviewed  CBC - Abnormal; Notable for the following:    WBC 12.2 (*)    All other components within normal limits  DIFFERENTIAL - Abnormal; Notable for the following:    Neutro Abs 8.4 (*)    All other components within normal limits  COMPREHENSIVE METABOLIC PANEL - Abnormal; Notable for the following:    Sodium 132 (*)    Chloride 98 (*)    Glucose, Bld 118 (*)    Calcium 8.6 (*)    All other components  within normal limits  PROTIME-INR  APTT  TROPONIN I  HCG, QUANTITATIVE, PREGNANCY  CBG MONITORING, ED    EKG: Pending   Radiology:     EXAM: CT HEAD WITHOUT CONTRAST  TECHNIQUE: Contiguous axial images were obtained from the base of the skull through the vertex without intravenous contrast.  COMPARISON: None.  FINDINGS: Normal appearing  cerebral hemispheres and posterior fossa structures. Normal size and position of the ventricles. No intracranial hemorrhage, mass lesion or CT evidence of acute infarction. Unremarkable bones and included paranasal sinuses.  IMPRESSION: Normal examination.   I personally reviewed the radiologic studies   Procedures: Patient had neurology consultation shortly after my evaluation return of her CAT scan. Head CT did not show any abnormalities or contraindications TPA. The neurologist agrees that the patient's not a TPA candidate at this time with a NIH score of 2  Critical Care:  CRITICAL CARE Performed by: Daymon Larsen   Total critical care time: 33 minutes  Critical care time was exclusive of separately billable procedures and treating other patients.  Critical care was necessary to treat or prevent imminent or life-threatening deterioration.  Critical care was time spent personally by me on the following activities: development of treatment plan with patient and/or surrogate as well as nursing, discussions with consultants, evaluation of patient's response to treatment, examination of patient, obtaining history from patient or surrogate, ordering and performing treatments and interventions, ordering and review of laboratory studies, ordering and review of radiographic studies, pulse oximetry and re-evaluation of patient's condition. Critical care mainly around evaluation and treatment for possible acute cerebrovascular accident.    ED Course: Patient's stay was uneventful and she was given aspirin and Ativan 1 mg IV. Given  her past history and discussion with her husband this may be psychogenic in nature. The patient declines on TPA and also with discussing indications and contraindications at the bedside. Also agrees that TPA is not necessary. Patient otherwise had gradual improvement from stated right-sided weakness at home to at present 4 out of 5 strength on the right.    Assessment:  Possible transient ischemic attack versus cerebrovascular accident   Final Clinical Impression: Final diagnoses:  Transient cerebral ischemia, unspecified transient cerebral ischemia type     Plan:  Patient's case was reviewed with the hospitalist team, further disposition and management depends upon their evaluation.            Daymon Larsen, MD 12/03/14 [id number]

## 2014-12-03 NOTE — Progress Notes (Addendum)
   12/03/14 1402  Clinical Encounter Type  Visited With Patient and family together  Visit Type Code  Referral From Nurse  Consult/Referral To Chaplain  Spiritual Encounters  Spiritual Needs Emotional  Stress Factors  Patient Stress Factors None identified  Family Stress Factors None identified  Chaplain received a page for a code stroke. Reported to the CT area first and then inquired at the ED. Was able to connect with patient and family. Offered emotional support to family as applicable. Received a second page and inquired and all was well. Chaplain Bufford Helms A. Jeshawn Melucci Ext. [phone]

## 2014-12-03 NOTE — Plan of Care (Signed)
Problem: Discharge Progression Outcomes Goal: Discharge plan in place and appropriate Outcome: Progressing Individualization:  Pt has Hx of fibromyalgia, hyperlipidemia, HTN.  She is a smoker.  Pt preparing for bariatric surgery possibly in November.  Pt from home, lives w/husband. Pt started having numbness and pain in R leg/arm; also experienced light headedness/dizzy; Some N&V.  Also had slurred speech.  Symptoms resolved.  CT negative - possible TIA.  Goal: Pain controlled with appropriate interventions Outcome: Progressing Pt has fibromyalgia pain - but doesn't take any pain medication.  Goal: Activity appropriate for discharge plan Outcome: Progressing Pt has had fall in last 6 mos - is considered a high fall risk.  Goal: Other Discharge Outcomes/Goals Outcome: Progressing Plan of Care Progress to Goal:  Pt will have MRI tomorrow - unable to do today b/c of holiday.  Symptoms are resolving.  NIHSS score = 4.  Right side is showing some deficits.  CT was normal.

## 2014-12-04 DIAGNOSIS — R200 Anesthesia of skin: Secondary | ICD-10-CM

## 2014-12-04 NOTE — Plan of Care (Signed)
Problem: Discharge Progression Outcomes Goal: Other Discharge Outcomes/Goals Outcome: Progressing Pt is alert and oriented, standby assist to the bathroom. NS at 100 ml/hr continued. NIH score of 3, some drift present on left arm and leg. Pt states she is always in pain, does not want anything since it does not help the pain.

## 2014-12-04 NOTE — Consult Note (Addendum)
CC:  R sided numbness   HPI: Cathy Mitchell is an 49 y.o. female with a known history of fibromyalgia and essential hypertension who presents with R sided numbness as well as pain on  R side.  Pt also complaining of being lightheaded. The lightheadedness and disease was worse show she came to the ER for further evaluation. In the ER apparently a code stroke was called. The neurologist on-call was consulted. She underwent a CT scan of the head which was negative for acute stroke. Currently back to baseline ans is very anxious.  Unable to obtain MRI as she can't get her ring off.   Past Medical History  Diagnosis Date  . Sleep apnea   . Hypertension   . Anxiety   . Neuromuscular disorder     Past Surgical History  Procedure Laterality Date  . Tubal ligation    . Dilation and curettage of uterus    . Ganglion cyst excision Bilateral   . Hand surgery Bilateral Resection    Family History  Problem Relation Age of Onset  . COPD Father   . Thyroid disease Daughter   . Eczema Son   . Heart disease Maternal Grandfather   . Diabetes Paternal Grandfather   . Heart disease Paternal Grandfather     Social History:  reports that she has been smoking Cigarettes.  She has been smoking about 0.50 packs per day. She has never used smokeless tobacco. She reports that she does not drink alcohol or use illicit drugs.  Allergies  Allergen Reactions  . Percocet [Oxycodone-Acetaminophen] Nausea And Vomiting and Other (See Comments)    Reaction:  Dizziness and blurred vision     Medications: I have reviewed the patient's current medications.  ROS: History obtained from the patient  General ROS: negative for - chills, fatigue, fever, night sweats, weight gain or weight loss Psychological ROS: anxiety/depressson  Ophthalmic ROS: negative for - blurry vision, double vision, eye pain or loss of vision ENT ROS: negative for - epistaxis, nasal discharge, oral lesions, sore throat, tinnitus or  vertigo Allergy and Immunology ROS: negative for - hives or itchy/watery eyes Hematological and Lymphatic ROS: negative for - bleeding problems, bruising or swollen lymph nodes Endocrine ROS: negative for - galactorrhea, hair pattern changes, polydipsia/polyuria or temperature intolerance Respiratory ROS: negative for - cough, hemoptysis, shortness of breath or wheezing Cardiovascular ROS: negative for - chest pain, dyspnea on exertion, edema or irregular heartbeat Gastrointestinal ROS: negative for - abdominal pain, diarrhea, hematemesis, nausea/vomiting or stool incontinence Genito-Urinary ROS: negative for - dysuria, hematuria, incontinence or urinary frequency/urgency Musculoskeletal ROS: negative for - joint swelling or muscular weakness Neurological ROS: as noted in HPI Dermatological ROS: negative for rash and skin lesion changes  Physical Examination: Blood pressure 145/84, pulse 99, temperature 97.6 F (36.4 C), temperature source Oral, resp. rate 20, height 5\' 9"  (1.753 m), weight 164.656 kg (363 lb), SpO2 100 %.   Neurological Examination Mental Status: Alert, oriented, thought content appropriate.  Speech fluent without evidence of aphasia.  Able to follow 3 step commands without difficulty. Cranial Nerves: II: Discs flat bilaterally; Visual fields grossly normal, pupils equal, round, reactive to light and accommodation III,IV, VI: ptosis not present, extra-ocular motions intact bilaterally V,VII: smile symmetric, facial light touch sensation normal bilaterally VIII: hearing normal bilaterally IX,X: gag reflex present XI: bilateral shoulder shrug XII: midline tongue extension Motor: Right : Upper extremity   5/5    Left:     Upper extremity  5/5  Lower extremity   5/5     Lower extremity   5/5 Tone and bulk:normal tone throughout; no atrophy noted Sensory: Pinprick and light touch intact throughout, bilaterally Deep Tendon Reflexes: 1+ and symmetric  throughout Plantars: Right: downgoing   Left: downgoing Cerebellar: normal finger-to-nose, normal rapid alternating movements and normal heel-to-shin test Gait: not tested      Laboratory Studies:   Basic Metabolic Panel:  Recent Labs Lab 12/03/14 1326  NA 132*  K 3.8  CL 98*  CO2 25  GLUCOSE 118*  BUN 16  CREATININE 0.72  CALCIUM 8.6*    Liver Function Tests:  Recent Labs Lab 12/03/14 1326  AST 25  ALT 26  ALKPHOS 66  BILITOT 0.4  PROT 7.4  ALBUMIN 3.7   No results for input(s): LIPASE, AMYLASE in the last 168 hours. No results for input(s): AMMONIA in the last 168 hours.  CBC:  Recent Labs Lab 12/03/14 1326  WBC 12.2*  NEUTROABS 8.4*  HGB 14.5  HCT 43.1  MCV 86.5  PLT 334    Cardiac Enzymes:  Recent Labs Lab 12/03/14 1326  TROPONINI <0.03    BNP: Invalid input(s): POCBNP  CBG: No results for input(s): GLUCAP in the last 168 hours.  Microbiology: Results for orders placed or performed in visit on 01/21/12  Wound culture     Status: None   Collection Time: 01/21/12  2:36 PM  Result Value Ref Range Status   Micro Text Report   Final       SOURCE: LEFT EAR    ORGANISM 1                LIGHT GROWTH PSEUDOMONAS AERUGINOSA   ORGANISM 2                LIGHT GROWTH PROPIONIBACTERIUM ACNES   COMMENT                   -   COMMENT                   -   GRAM STAIN                FEW GRAM POSITIVE ROD   GRAM STAIN                FEW GRAM POSITIVE COCCI   GRAM STAIN                FEW GRAM NEGATIVE ROD   ANTIBIOTIC                    ORG#1    ORG#2     CEFTAZIDIME                   S                  CIPROFLOXACIN                 S                  GENTAMICIN                    S                  IMIPENEM                      S  LEVOFLOXACIN                  S                  BETA-LACTAMASE                         NEGATIVE      Coagulation Studies:  Recent Labs  12/03/14 1326  LABPROT 13.1  INR 0.97     Urinalysis: No results for input(s): COLORURINE, LABSPEC, PHURINE, GLUCOSEU, HGBUR, BILIRUBINUR, KETONESUR, PROTEINUR, UROBILINOGEN, NITRITE, LEUKOCYTESUR in the last 168 hours.  Invalid input(s): APPERANCEUR  Lipid Panel:     Component Value Date/Time   CHOL 146 12/03/2014 1619   TRIG 135 12/03/2014 1619   HDL 34* 12/03/2014 1619   CHOLHDL 4.3 12/03/2014 1619   VLDL 27 12/03/2014 1619   LDLCALC 85 12/03/2014 1619    HgbA1C: No results found for: HGBA1C  Urine Drug Screen:  No results found for: LABOPIA, COCAINSCRNUR, LABBENZ, AMPHETMU, THCU, LABBARB  Alcohol Level: No results for input(s): ETH in the last 168 hours.  Other results: EKG: normal EKG, normal sinus rhythm, unchanged from previous tracings.  Imaging: Ct Head Wo Contrast  12/03/2014   CLINICAL DATA:  Acute onset of slurred speech, left arm numbness and dizziness.  EXAM: CT HEAD WITHOUT CONTRAST  TECHNIQUE: Contiguous axial images were obtained from the base of the skull through the vertex without intravenous contrast.  COMPARISON:  None.  FINDINGS: Normal appearing cerebral hemispheres and posterior fossa structures. Normal size and position of the ventricles. No intracranial hemorrhage, mass lesion or CT evidence of acute infarction. Unremarkable bones and included paranasal sinuses.  IMPRESSION: Normal examination.  These results were called by telephone at the time of interpretation on 12/03/2014 at 1:27 pm to Dr. Larae Grooms , who verbally acknowledged these results.   Electronically Signed   By: Claudie Revering M.D.   On: 12/03/2014 13:29     Assessment/Plan:  49 y.o. female with a known history of fibromyalgia and essential hypertension who presents with R sided numbness as well as pain on  R side.  Pt also complaining of being lightheaded. The lightheadedness and disease was worse show she came to the ER for further evaluation. In the ER apparently a code stroke was called. The neurologist on-call was consulted.  She underwent a CT scan of the head which was negative for acute stroke. Currently back to baseline ans is very anxious.  Unable to obtain MRI as she can't get her ring off.   She is currently very anxious, states she is unsafe and wants to be d/c I do not see any need for her further neurologically  D/c planning.  Symptoms likely anxiety provoked  12/04/2014, 10:37 AM

## 2014-12-04 NOTE — Progress Notes (Signed)
Pt had a run of v tach at 0430, asymptomatic, resting quietly in bed watching television. MD notified, continue to monitor.

## 2014-12-04 NOTE — Discharge Summary (Signed)
[facility] NAME: Cathy Mitchell    MR#:  [id number]  DATE OF BIRTH:  01-14-1966  DATE OF ADMISSION:  12/03/2014 ADMITTING PHYSICIAN: Bettey Costa, MD  DATE OF DISCHARGE: 12/04/2014  PRIMARY CARE PHYSICIAN: Kathrine Haddock, NP    ADMISSION DIAGNOSIS:  Transient cerebral ischemia, unspecified transient cerebral ischemia type [G45.9]  DISCHARGE DIAGNOSIS:  Active Problems:   Numbness   SECONDARY DIAGNOSIS:   Past Medical History  Diagnosis Date  . Sleep apnea   . Hypertension   . Anxiety   . Neuromuscular disorder     HOSPITAL COURSE:  This is a 49 year old female with a history of fibromyalgia and essential hypertension who presented to the emergency room complaining of numbness in the right side. Further details please refer the H&P.  1. Numbness of the right extremity: Patient was initially admitted to evaluate for CVA. Her CT scan was negative. Patient refused MRI as she could not take her ring off. Patient had no neurological deficits. Patient was seen and evaluated neurology felt this is most likely consistent with anxiety which I and also  agree with.  2. Essential hypertension: Continue losartan.  3. Tobacco dependence patient was counseled on admission. Continue Chantix   DISCHARGE CONDITIONS AND DIET:  Patient is being discharged home in stable condition on a regular diet  CONSULTS OBTAINED:  Treatment Team:  Leotis Pain, MD  DRUG ALLERGIES:   Allergies  Allergen Reactions  . Percocet [Oxycodone-Acetaminophen] Nausea And Vomiting and Other (See Comments)    Reaction:  Dizziness and blurred vision     DISCHARGE MEDICATIONS:   Current Discharge Medication List    CONTINUE these medications which have NOT CHANGED   Details  acetaminophen (TYLENOL) 500 MG tablet Take 1,000 mg by mouth every 6 (six) hours as needed for mild pain or headache.    albuterol (PROVENTIL HFA;VENTOLIN HFA) 108 (90  BASE) MCG/ACT inhaler Inhale 2 puffs into the lungs every 6 (six) hours as needed for wheezing or shortness of breath. Qty: 1 Inhaler, Refills: 0    losartan (COZAAR) 50 MG tablet Take 50 mg by mouth daily.    varenicline (CHANTIX) 1 MG tablet Take 1 mg by mouth 2 (two) times daily.      STOP taking these medications     celecoxib (CELEBREX) 200 MG capsule               Today   CHIEF COMPLAINT:  Patient says all her symptoms have resolved. She is anxious.   VITAL SIGNS:  Blood pressure 145/84, pulse 99, temperature 97.6 F (36.4 C), temperature source Oral, resp. rate 20, height 5\' 9"  (1.753 m), weight 164.656 kg (363 lb), SpO2 100 %.   REVIEW OF SYSTEMS:  Review of Systems  Constitutional: Negative for fever, chills and malaise/fatigue.  HENT: Negative for sore throat.   Eyes: Negative for blurred vision.  Respiratory: Negative for cough, hemoptysis, shortness of breath and wheezing.   Cardiovascular: Negative for chest pain, palpitations and leg swelling.  Gastrointestinal: Negative for nausea, vomiting, abdominal pain, diarrhea and blood in stool.  Genitourinary: Negative for dysuria.  Musculoskeletal: Negative for back pain.  Neurological: Negative for dizziness, tremors and headaches.  Endo/Heme/Allergies: Does not bruise/bleed easily.     PHYSICAL EXAMINATION:  GENERAL:  49 y.o.-year-old patient lying in the bed with no acute distress.  NECK:  Supple, no jugular venous distention. No thyroid enlargement, no tenderness.  LUNGS: Normal breath sounds bilaterally, no  wheezing, rales,rhonchi  No use of accessory muscles of respiration.  CARDIOVASCULAR: S1, S2 normal. No murmurs, rubs, or gallops.  ABDOMEN: Soft, non-tender, non-distended. Bowel sounds present. No organomegaly or mass.  EXTREMITIES: No pedal edema, cyanosis, or clubbing.  PSYCHIATRIC: The patient is alert and oriented x 3.  SKIN: No obvious rash, lesion, or ulcer.   DATA REVIEW:    CBC  Recent Labs Lab 12/03/14 1326  WBC 12.2*  HGB 14.5  HCT 43.1  PLT 334    Chemistries   Recent Labs Lab 12/03/14 1326  NA 132*  K 3.8  CL 98*  CO2 25  GLUCOSE 118*  BUN 16  CREATININE 0.72  CALCIUM 8.6*  AST 25  ALT 26  ALKPHOS 66  BILITOT 0.4    Cardiac Enzymes  Recent Labs Lab 12/03/14 1326  TROPONINI <0.03    Microbiology Results  @[email or web address] @  RADIOLOGY:  Ct Head Wo Contrast  12/03/2014   CLINICAL DATA:  Acute onset of slurred speech, left arm numbness and dizziness.  EXAM: CT HEAD WITHOUT CONTRAST  TECHNIQUE: Contiguous axial images were obtained from the base of the skull through the vertex without intravenous contrast.  COMPARISON:  None.  FINDINGS: Normal appearing cerebral hemispheres and posterior fossa structures. Normal size and position of the ventricles. No intracranial hemorrhage, mass lesion or CT evidence of acute infarction. Unremarkable bones and included paranasal sinuses.  IMPRESSION: Normal examination.  These results were called by telephone at the time of interpretation on 12/03/2014 at 1:27 pm to Dr. Larae Grooms , who verbally acknowledged these results.   Electronically Signed   By: Claudie Revering M.D.   On: 12/03/2014 13:29      Management plans discussed with the patient and she is in agreement. Stable for discharge home  Patient should follow up with PCP in 1 week  CODE STATUS:     Code Status Orders        Start     Ordered   12/03/14 1607  Full code   Continuous     12/03/14 1606      TOTAL TIME TAKING CARE OF THIS PATIENT: 35 minutes.    Kariyah Baugh M.D on 12/04/2014 at 11:59 AM  Between 7am to 6pm - Pager - [phone] After 6pm go to [email or web address] - password [email or web address] [facility]  Office  [phone]  CC: Primary care physician; Kathrine Haddock, NP

## 2014-12-06 LAB — GLUCOSE, CAPILLARY: GLUCOSE-CAPILLARY: 116 mg/dL — AB (ref 65–99)

## 2014-12-21 ENCOUNTER — Ambulatory Visit (INDEPENDENT_AMBULATORY_CARE_PROVIDER_SITE_OTHER): Payer: BLUE CROSS/BLUE SHIELD | Admitting: Unknown Physician Specialty

## 2014-12-21 ENCOUNTER — Encounter: Payer: Self-pay | Admitting: Unknown Physician Specialty

## 2014-12-21 VITALS — BP 115/78 | HR 108 | Temp 98.1°F | Ht 68.3 in | Wt 363.8 lb

## 2014-12-21 DIAGNOSIS — E538 Deficiency of other specified B group vitamins: Secondary | ICD-10-CM

## 2014-12-21 DIAGNOSIS — Z23 Encounter for immunization: Secondary | ICD-10-CM | POA: Diagnosis not present

## 2014-12-21 DIAGNOSIS — I10 Essential (primary) hypertension: Secondary | ICD-10-CM

## 2014-12-21 DIAGNOSIS — M797 Fibromyalgia: Secondary | ICD-10-CM | POA: Diagnosis not present

## 2014-12-21 DIAGNOSIS — G4730 Sleep apnea, unspecified: Secondary | ICD-10-CM | POA: Diagnosis not present

## 2014-12-21 DIAGNOSIS — E668 Other obesity: Secondary | ICD-10-CM

## 2014-12-21 DIAGNOSIS — Z0000 Encounter for general adult medical examination without abnormal findings: Secondary | ICD-10-CM | POA: Diagnosis not present

## 2014-12-21 MED ORDER — CYANOCOBALAMIN 1000 MCG/ML IJ SOLN
1000.0000 ug | Freq: Once | INTRAMUSCULAR | Status: AC
Start: 2014-12-21 — End: 2014-12-21
  Administered 2014-12-21: 1000 ug via INTRAMUSCULAR

## 2014-12-21 NOTE — Patient Instructions (Signed)
f you have chronic pain and are looking for alternatives to medication and surgery, you have a lot of options.   However, not all alternative pain treatments work. Some can even be risky. Some alternative treatments may help with pain from bad backs, osteoarthritis, and headaches, but have no effect on chronic pain from fibromyalgia or diabetic nerve damage.  Here's a rundown of the most commonly used alternative treatments for chronic pain.  Acupuncture. Once seen as bizarre, acupuncture is rapidly becoming a mainstream treatment for pain. Studies have found that it works for pain caused by many conditions, including fibromyalgia, osteoarthritis, back injuries, and sports injuries. How does it work? No one's quite sure. It could release pain-numbing chemicals in the body. Or it might block the pain signals coming from the nerves.  Exercise. Motion is lotion Going for a walk or swim isn't a treatment, exactly. But regular physical activity has big benefits for people with many different painful conditions. Study after study has found that physical activity can help relieve chronic pain, as well as boost energy and mood.   This is particularly true of pain related to arthritis .    Chiropractic manipulation. Although mainstream medicine has traditionally regarded spinal manipulation with suspicion, it's becoming a more accepted treatment.  I think many people respond will th chiropractic treatment.    Supplements and vitamins. There is evidence that certain dietary supplements and vitamins can help with certain types of pain. Fish oil and flaxseed oil is often used to reduce pain associated with swelling. Topical capsaicin, derived from chili peppers, may help with arthritis, diabetic nerve pain, and other conditions. There's evidence that glucosamine can help relieve moderate to severe pain from osteoarthritis in the knee.  A spice Tumeric is used my many to treat chronic pain.  Curcumin is the active  ingredient and thought to have anti-inflammatory effects and reduces pain and stiffness.  This can be found as capsules or extract (more likely to be free of contaminants). For OA: Capsule, typically 400 mg to 600 mg, three times per day; or 0.5 g to 1 g of powdered root up to 3 g per day. For RA: 500 mg twice daily.  It's best absorbed if it contains black pepper.  Tart cherry juice is a natural anti-inflammatory agent.  We sometimes hear from people who would like to know where to find cherries out of season. Some report that their local market does not carry cherry juice. There are a number of reputable online vendors who could supply cherry concentrate so you can use cherry juice to see if it helps your arthritic joints. Studies have been done with powdered [place] cherries, CherryPURE. The usual dose is 480 mg/day.  But when it comes to supplements, you have to be careful. High doses of B6 can damage the nerves.   Some studies suggest that supplements such as ginkgo biloba and ginseng can thin the blood and increase the risk of bleeding. This could lead to serious consequences for anyone getting surgery for chronic pain.  Finally, supplements don't always contain what they claim as supplement manufacturers are not regulated by the FDA.  I get very confused with the thousands of supplements available and which to choose.  Brands to consider: [employer], [employer], [employer], [employer], [employer], [employer] and [employer]   I usually go on Amazon and look for the highly rated products.  [employer] or [employer] are great resources and have done the research   for you.     Cognitive Behavioral Therapy. Some people with chronic pain balk at the idea of seeing a therapist -- they think it implies that their pain isn't real. But studies show that depression and chronic pain often go together. Chronic pain can cause or worsen depression; depression can lower a person's tolerance for  pain.  Stress-reduction techniques. There are number of approaches, including: Yoga. There's good evidence that yoga can help with chronic pain,  specifically fibromyalgia, neck pain, back pain, and arthritis. Relaxation therapy. This is actually a category of techniques that help people calm the body and release tension -- a process that might also reduce pain. Some approaches teach people how to focus on their breathing. Research shows that relaxation therapy can help with fibromyalgia, headache, osteoarthritis, and other conditions. Hypnosis. Studies have found this approach helpful with different sorts of pain, like back pain, repetitive strain injuries, and cancer pain. Guided imagery. Research shows that guided imagery can help with conditions like headache pain, cancer pain, osteoarthritis, and fibromyalgia. How does it work? An expert would teach you ways to direct your thoughts by focusing on specific images. Music therapy. This approach gets people to either perform or listen to music. Studies have found that it can help with many different pain conditions, like osteoarthritis and cancer pain. Biofeedback. This approach teaches you how to control normally unconscious bodily functions, like blood pressure or your heart rate. Studies have found that it can help with headaches, fibromyalgia, and other conditions. Massage. It's undeniably relaxing. And there's some evidence that massage can help ease pain from rheumatoid arthritis, neck and back injuries, and fibromyalgia.  Risky Alternative Pain Treatments Experts say you should keep your expectations for alternative pain treatments modest -- especially when it comes to "miracle cures." Controlling chronic pain is not simple. A single supplement, device, or treatment is not going to make your chronic pain disappear. You a need to be suspicious of anyone pushing a treatment when the financial motive is blatant. That doesn't only apply to pain  treatments advertised on dubious web sites asking for your credit card number.  Experts say that you should try to keep up to date with research into alternative treatments for chronic pain. The options for people with chronic pain are always growing -- and some of the odder treatments of today might become the mainstream treatments of tomorrow.   

## 2014-12-21 NOTE — Assessment & Plan Note (Signed)
Planning bariatric surgery

## 2014-12-21 NOTE — Assessment & Plan Note (Signed)
BP is good.  Will DC Losartan due to excellent BP and wanting to get off BP medications prior to surgery.

## 2014-12-21 NOTE — Assessment & Plan Note (Signed)
Pt will look to do a home sleep study

## 2014-12-21 NOTE — Progress Notes (Signed)
BP 115/78 mmHg  Pulse 108  Temp(Src) 98.1 F (36.7 C)  Ht 5' 8.3" (1.735 m)  Wt 363 lb 12.8 oz (165.019 kg)  BMI 54.82 kg/m2  SpO2 96%   Subjective:    Patient ID: Cathy Mitchell, female    DOB: 04-02-1965, 49 y.o.   MRN: [id number]  HPI: Cathy Mitchell is a 49 y.o. female  Chief Complaint  Patient presents with  . Annual Exam   "I'm in so much pain that I can't stand it."  Came off all her medications except Losartan due to preparation for bariatric surgery.  Had a lot of labs in the hospital.      Sleep apnea.  Having trouble completing the test.    "I had a heart attack and I had a prestroke."  Feels the stroke is due to a low sodium level. Her stress test and EKG indicate she had a heart attack in the past.    Hypertension: BP is good.    Relevant past medical, surgical, family and social history reviewed and updated as indicated. Interim medical history since our last visit reviewed. Allergies and medications reviewed and updated.  Review of Systems  All other systems reviewed and are negative.   Per HPI unless specifically indicated above     Objective:    BP 115/78 mmHg  Pulse 108  Temp(Src) 98.1 F (36.7 C)  Ht 5' 8.3" (1.735 m)  Wt 363 lb 12.8 oz (165.019 kg)  BMI 54.82 kg/m2  SpO2 96%  Wt Readings from Last 3 Encounters:  12/21/14 363 lb 12.8 oz (165.019 kg)  12/03/14 363 lb (164.656 kg)  10/15/14 368 lb (166.924 kg)    Physical Exam  Constitutional: She is oriented to person, place, and time. She appears well-developed and well-nourished. No distress.  HENT:  Head: Normocephalic and atraumatic.  Eyes: Conjunctivae and lids are normal. Right eye exhibits no discharge. Left eye exhibits no discharge. No scleral icterus.  Cardiovascular: Normal rate and regular rhythm.   Pulmonary/Chest: Effort normal. No respiratory distress.  Abdominal: Normal appearance. There is no splenomegaly or hepatomegaly.  Musculoskeletal: Normal range of motion.   Neurological: She is alert and oriented to person, place, and time.  Skin: Skin is intact. No rash noted. No pallor.  Psychiatric: She has a normal mood and affect. Her behavior is normal. Judgment and thought content normal.  Nursing note and vitals reviewed. "I never have a pap smear" and is refusing pelvic.   Comprehensive exam done while in the hospital previously.  Labs are as below.    Results for orders placed or performed during the hospital encounter of 12/03/14  Protime-INR  Result Value Ref Range   Prothrombin Time 13.1 11.4 - 15.0 seconds   INR 0.97   APTT  Result Value Ref Range   aPTT 32 24 - 36 seconds  CBC  Result Value Ref Range   WBC 12.2 (H) 3.6 - 11.0 K/uL   RBC 4.99 3.80 - 5.20 MIL/uL   Hemoglobin 14.5 12.0 - 16.0 g/dL   HCT 43.1 35.0 - 47.0 %   MCV 86.5 80.0 - 100.0 fL   MCH 29.1 26.0 - 34.0 pg   MCHC 33.7 32.0 - 36.0 g/dL   RDW 13.7 11.5 - 14.5 %   Platelets 334 150 - 440 K/uL  Differential  Result Value Ref Range   Neutrophils Relative % 69 %   Neutro Abs 8.4 (H) 1.4 - 6.5 K/uL   Lymphocytes Relative  22 %   Lymphs Abs 2.7 1.0 - 3.6 K/uL   Monocytes Relative 6 %   Monocytes Absolute 0.8 0.2 - 0.9 K/uL   Eosinophils Relative 3 %   Eosinophils Absolute 0.4 0 - 0.7 K/uL   Basophils Relative 0 %   Basophils Absolute 0.0 0 - 0.1 K/uL  Comprehensive metabolic panel  Result Value Ref Range   Sodium 132 (L) 135 - 145 mmol/L   Potassium 3.8 3.5 - 5.1 mmol/L   Chloride 98 (L) 101 - 111 mmol/L   CO2 25 22 - 32 mmol/L   Glucose, Bld 118 (H) 65 - 99 mg/dL   BUN 16 6 - 20 mg/dL   Creatinine, Ser 0.72 0.44 - 1.00 mg/dL   Calcium 8.6 (L) 8.9 - 10.3 mg/dL   Total Protein 7.4 6.5 - 8.1 g/dL   Albumin 3.7 3.5 - 5.0 g/dL   AST 25 15 - 41 U/L   ALT 26 14 - 54 U/L   Alkaline Phosphatase 66 38 - 126 U/L   Total Bilirubin 0.4 0.3 - 1.2 mg/dL   GFR calc non Af Amer >60 >60 mL/min   GFR calc Af Amer >60 >60 mL/min   Anion gap 9 5 - 15  Troponin I  Result Value  Ref Range   Troponin I <0.03 <0.031 ng/mL  hCG, quantitative, pregnancy  Result Value Ref Range   hCG, Beta Chain, Quant, S 1 <5 mIU/mL  Lipid panel  Result Value Ref Range   Cholesterol 146 0 - 200 mg/dL   Triglycerides 135 <150 mg/dL   HDL 34 (L) >40 mg/dL   Total CHOL/HDL Ratio 4.3 RATIO   VLDL 27 0 - 40 mg/dL   LDL Cholesterol 85 0 - 99 mg/dL  Glucose, capillary  Result Value Ref Range   Glucose-Capillary 116 (H) 65 - 99 mg/dL      Assessment & Plan:   Problem List Items Addressed This Visit      Unprioritized   High blood pressure    BP is good.  Will DC Losartan due to excellent BP and wanting to get off BP medications prior to surgery.        Fibromyalgia    Off of medications for fibromyalgia.  Gave handout on "alternatives for chronic pain."      Sleep apnea    Pt will look to do a home sleep study      Extreme obesity    Planning bariatric surgery       Other Visit Diagnoses    Immunization due    -  Primary    Relevant Orders    Flu Vaccine QUAD 36+ mos PF IM (Fluarix & Fluzone Quad PF) (Completed)    Vitamin B12 deficiency        Relevant Medications    cyanocobalamin ((VITAMIN B-12)) injection 1,000 mcg (Completed)    Annual physical exam            Follow up plan: Return if symptoms worsen or fail to improve.

## 2014-12-21 NOTE — Assessment & Plan Note (Signed)
Off of medications for fibromyalgia.  Gave handout on "alternatives for chronic pain."

## 2015-01-01 ENCOUNTER — Telehealth: Payer: Self-pay | Admitting: Unknown Physician Specialty

## 2015-01-01 ENCOUNTER — Encounter: Payer: Self-pay | Admitting: Unknown Physician Specialty

## 2015-01-01 NOTE — Telephone Encounter (Signed)
Pt called stated Malachy Mood sent her to see Dr. Haze Boyden who needs a letter from [facility] that states that the pt has worked on weight loss over a 4 month period. Letter should be faxed to # [phone]. Please call pt with any questions. Pt stated it's the insurance company that is wanting this. Thanks.

## 2015-01-01 NOTE — Telephone Encounter (Signed)
I will try, but it may require a visit

## 2015-01-01 NOTE — Telephone Encounter (Signed)
Routing to provider  

## 2015-01-01 NOTE — Telephone Encounter (Signed)
Called and left patient a voicemail letting her know that I got her note faxed to the number she provided.

## 2015-01-28 ENCOUNTER — Encounter: Payer: Self-pay | Admitting: Unknown Physician Specialty

## 2015-01-28 ENCOUNTER — Ambulatory Visit (INDEPENDENT_AMBULATORY_CARE_PROVIDER_SITE_OTHER): Payer: BLUE CROSS/BLUE SHIELD | Admitting: Unknown Physician Specialty

## 2015-01-28 VITALS — BP 149/82 | HR 47 | Temp 98.1°F | Ht 68.3 in | Wt 357.8 lb

## 2015-01-28 DIAGNOSIS — F419 Anxiety disorder, unspecified: Secondary | ICD-10-CM | POA: Diagnosis not present

## 2015-01-28 DIAGNOSIS — R002 Palpitations: Secondary | ICD-10-CM | POA: Diagnosis not present

## 2015-01-28 DIAGNOSIS — R079 Chest pain, unspecified: Secondary | ICD-10-CM

## 2015-01-28 NOTE — Progress Notes (Addendum)
BP 149/82 mmHg  Pulse 47  Temp(Src) 98.1 F (36.7 C)  Ht 5' 8.3" (1.735 m)  Wt 357 lb 12.8 oz (162.297 kg)  BMI 53.92 kg/m2  SpO2 97%   Subjective:    Patient ID: Cathy Mitchell, female    DOB: 14-Jul-1965, 49 y.o.   MRN: [id number]  HPI: Cathy Mitchell is a 49 y.o. female  Chief Complaint  Patient presents with  . Chest Pain    pt states she started having chest pain last night when she went to bed last night. pt states she had a "major stressor" and she thinks she had a heart attack   Pt states she went to bed last night and "was fine" and had sudden "out of control" heart rate.  After a few minutes "baM" her heart felt it "jumped" and heart stopped racing.  This was not associated with pain.  She does admit to a major stressor.  She didn't go to the hospital but "wasn't sure."  She was told by the bariatric doctor that she "had a heart attack" in the past.  She did get a stress test and saw a cardiologist and passed and approved for surgery.  Noted in September low sodium.    Relevant past medical, surgical, family and social history reviewed and updated as indicated. Interim medical history since our last visit reviewed. Allergies and medications reviewed and updated.  Review of Systems  Per HPI unless specifically indicated above     Objective:    BP 149/82 mmHg  Pulse 47  Temp(Src) 98.1 F (36.7 C)  Ht 5' 8.3" (1.735 m)  Wt 357 lb 12.8 oz (162.297 kg)  BMI 53.92 kg/m2  SpO2 97%  Wt Readings from Last 3 Encounters:  01/28/15 357 lb 12.8 oz (162.297 kg)  12/21/14 363 lb 12.8 oz (165.019 kg)  12/03/14 363 lb (164.656 kg)    Physical Exam  Constitutional: She is oriented to person, place, and time. She appears well-developed and well-nourished. No distress.  HENT:  Head: Normocephalic and atraumatic.  Eyes: Conjunctivae and lids are normal. Right eye exhibits no discharge. Left eye exhibits no discharge. No scleral icterus.  Cardiovascular: Normal rate, regular  rhythm and normal heart sounds.   Pulmonary/Chest: Effort normal and breath sounds normal. No respiratory distress.  Abdominal: Normal appearance. There is no splenomegaly or hepatomegaly.  Musculoskeletal: Normal range of motion.  Neurological: She is alert and oriented to person, place, and time.  Skin: Skin is intact. No rash noted. No pallor.  Psychiatric: She has a normal mood and affect. Her behavior is normal. Judgment and thought content normal.    Results for orders placed or performed during the hospital encounter of 12/03/14  Protime-INR  Result Value Ref Range   Prothrombin Time 13.1 11.4 - 15.0 seconds   INR 0.97   APTT  Result Value Ref Range   aPTT 32 24 - 36 seconds  CBC  Result Value Ref Range   WBC 12.2 (H) 3.6 - 11.0 K/uL   RBC 4.99 3.80 - 5.20 MIL/uL   Hemoglobin 14.5 12.0 - 16.0 g/dL   HCT 43.1 35.0 - 47.0 %   MCV 86.5 80.0 - 100.0 fL   MCH 29.1 26.0 - 34.0 pg   MCHC 33.7 32.0 - 36.0 g/dL   RDW 13.7 11.5 - 14.5 %   Platelets 334 150 - 440 K/uL  Differential  Result Value Ref Range   Neutrophils Relative % 69 %   Neutro  Abs 8.4 (H) 1.4 - 6.5 K/uL   Lymphocytes Relative 22 %   Lymphs Abs 2.7 1.0 - 3.6 K/uL   Monocytes Relative 6 %   Monocytes Absolute 0.8 0.2 - 0.9 K/uL   Eosinophils Relative 3 %   Eosinophils Absolute 0.4 0 - 0.7 K/uL   Basophils Relative 0 %   Basophils Absolute 0.0 0 - 0.1 K/uL  Comprehensive metabolic panel  Result Value Ref Range   Sodium 132 (L) 135 - 145 mmol/L   Potassium 3.8 3.5 - 5.1 mmol/L   Chloride 98 (L) 101 - 111 mmol/L   CO2 25 22 - 32 mmol/L   Glucose, Bld 118 (H) 65 - 99 mg/dL   BUN 16 6 - 20 mg/dL   Creatinine, Ser 0.72 0.44 - 1.00 mg/dL   Calcium 8.6 (L) 8.9 - 10.3 mg/dL   Total Protein 7.4 6.5 - 8.1 g/dL   Albumin 3.7 3.5 - 5.0 g/dL   AST 25 15 - 41 U/L   ALT 26 14 - 54 U/L   Alkaline Phosphatase 66 38 - 126 U/L   Total Bilirubin 0.4 0.3 - 1.2 mg/dL   GFR calc non Af Amer >60 >60 mL/min   GFR calc Af  Amer >60 >60 mL/min   Anion gap 9 5 - 15  Troponin I  Result Value Ref Range   Troponin I <0.03 <0.031 ng/mL  hCG, quantitative, pregnancy  Result Value Ref Range   hCG, Beta Chain, Quant, S 1 <5 mIU/mL  Lipid panel  Result Value Ref Range   Cholesterol 146 0 - 200 mg/dL   Triglycerides 135 <150 mg/dL   HDL 34 (L) >40 mg/dL   Total CHOL/HDL Ratio 4.3 RATIO   VLDL 27 0 - 40 mg/dL   LDL Cholesterol 85 0 - 99 mg/dL  Glucose, capillary  Result Value Ref Range   Glucose-Capillary 116 (H) 65 - 99 mg/dL      Assessment & Plan:   Problem List Items Addressed This Visit      Unprioritized   Acute anxiety    Probably related to palpitations       Other Visit Diagnoses    Chest pain, unspecified chest pain type    -  Primary    Relevant Orders    EKG 12-Lead (Completed)    Heart palpitations        Relevant Orders    Comprehensive metabolic panel    Magnesium       Discussed EKG with Dr. Sanda Klein and no significant change from previous.  Q wave in lead III seems to appear in August.  She has had a stress test and evaluation by cardiology at that time.  If persistent chest pain, go to the ER.  Check CMP and Magnesium  Follow up plan: Return if symptoms worsen or fail to improve.

## 2015-01-28 NOTE — Assessment & Plan Note (Signed)
Probably related to palpitations

## 2015-01-29 ENCOUNTER — Encounter: Payer: Self-pay | Admitting: Unknown Physician Specialty

## 2015-01-29 LAB — COMPREHENSIVE METABOLIC PANEL
ALBUMIN: 4 g/dL (ref 3.5–5.5)
ALT: 33 IU/L — ABNORMAL HIGH (ref 0–32)
AST: 21 IU/L (ref 0–40)
Albumin/Globulin Ratio: 1.4 (ref 1.1–2.5)
Alkaline Phosphatase: 69 IU/L (ref 39–117)
BUN / CREAT RATIO: 17 (ref 9–23)
BUN: 12 mg/dL (ref 6–24)
Bilirubin Total: 0.2 mg/dL (ref 0.0–1.2)
CALCIUM: 8.9 mg/dL (ref 8.7–10.2)
CO2: 23 mmol/L (ref 18–29)
CREATININE: 0.71 mg/dL (ref 0.57–1.00)
Chloride: 103 mmol/L (ref 97–106)
GFR, EST AFRICAN AMERICAN: 117 mL/min/{1.73_m2} (ref >59)
GFR, EST NON AFRICAN AMERICAN: 102 mL/min/{1.73_m2} (ref >59)
GLUCOSE: 103 mg/dL — AB (ref 65–99)
Globulin, Total: 2.8 g/dL (ref 1.5–4.5)
Potassium: 4.3 mmol/L (ref 3.5–5.2)
Sodium: 141 mmol/L (ref 136–144)
TOTAL PROTEIN: 6.8 g/dL (ref 6.0–8.5)

## 2015-01-29 LAB — MAGNESIUM: Magnesium: 2.2 mg/dL (ref 1.6–2.3)

## 2015-01-29 NOTE — Progress Notes (Signed)
Quick Note:  Normal labs. Patient notified by letter. ______ 

## 2015-02-27 DIAGNOSIS — F17200 Nicotine dependence, unspecified, uncomplicated: Secondary | ICD-10-CM | POA: Insufficient documentation

## 2015-03-01 ENCOUNTER — Ambulatory Visit
Admission: RE | Admit: 2015-03-01 | Discharge: 2015-03-01 | Disposition: A | Discharge status: Home / Self Care | Payer: BLUE CROSS/BLUE SHIELD | Source: Ambulatory Visit | Attending: Unknown Physician Specialty | Admitting: Unknown Physician Specialty

## 2015-03-01 ENCOUNTER — Encounter: Payer: Self-pay | Admitting: Unknown Physician Specialty

## 2015-03-01 ENCOUNTER — Ambulatory Visit (INDEPENDENT_AMBULATORY_CARE_PROVIDER_SITE_OTHER): Payer: BLUE CROSS/BLUE SHIELD | Admitting: Unknown Physician Specialty

## 2015-03-01 VITALS — BP 126/75 | HR 99 | Temp 97.9°F | Ht 68.7 in | Wt 357.2 lb

## 2015-03-01 DIAGNOSIS — R918 Other nonspecific abnormal finding of lung field: Secondary | ICD-10-CM | POA: Insufficient documentation

## 2015-03-01 DIAGNOSIS — R0789 Other chest pain: Secondary | ICD-10-CM | POA: Diagnosis not present

## 2015-03-01 NOTE — Patient Instructions (Signed)

## 2015-03-01 NOTE — Progress Notes (Signed)
BP 126/75 mmHg  Pulse 99  Temp(Src) 97.9 F (36.6 C)  Ht 5' 8.7" (1.745 m)  Wt 357 lb 3.2 oz (162.025 kg)  BMI 53.21 kg/m2  SpO2 95%   Subjective:    Patient ID: Cathy Mitchell, female    DOB: 1965/05/28, 49 y.o.   MRN: [id number]  HPI: Cathy Mitchell is a 49 y.o. female  Chief Complaint  Patient presents with  . Breathing Problem    pt states she is having trouble breathing and has been for about 3 days now. States she is in horrible pain when laying down but not so bad when sitting up.   SOB She thinks the CPAP machine has given her a tough of pneumonia.  She has pain anterior and posterior pain.  She is unable to sleep due to the amount of pain that is in her chest wall area.  Lying down makes it worse, sitting up makes it feel better.  Has trouble taking a full breath.  She stopped using the CPAP due to the problems.  No fever  Relevant past medical, surgical, family and social history reviewed and updated as indicated. Interim medical history since our last visit reviewed. Allergies and medications reviewed and updated.  Review of Systems  Per HPI unless specifically indicated above     Objective:    BP 126/75 mmHg  Pulse 99  Temp(Src) 97.9 F (36.6 C)  Ht 5' 8.7" (1.745 m)  Wt 357 lb 3.2 oz (162.025 kg)  BMI 53.21 kg/m2  SpO2 95%  Wt Readings from Last 3 Encounters:  03/01/15 357 lb 3.2 oz (162.025 kg)  01/28/15 357 lb 12.8 oz (162.297 kg)  12/21/14 363 lb 12.8 oz (165.019 kg)    Physical Exam  Constitutional: She is oriented to person, place, and time. She appears well-developed and well-nourished. No distress.  HENT:  Head: Normocephalic and atraumatic.  Eyes: Conjunctivae and lids are normal. Right eye exhibits no discharge. Left eye exhibits no discharge. No scleral icterus.  Cardiovascular: Normal rate, regular rhythm and normal heart sounds.   Pulmonary/Chest: Effort normal and breath sounds normal. No respiratory distress. She exhibits tenderness.   Reproducible chest wall pain  Abdominal: Soft. Normal appearance and bowel sounds are normal. There is no splenomegaly or hepatomegaly.  Musculoskeletal: Normal range of motion.  Neurological: She is alert and oriented to person, place, and time.  Skin: Skin is intact. No rash noted. No pallor.  Psychiatric: She has a normal mood and affect. Her behavior is normal. Judgment and thought content normal.    Results for orders placed or performed in visit on 01/28/15  Comprehensive metabolic panel  Result Value Ref Range   Glucose 103 (H) 65 - 99 mg/dL   BUN 12 6 - 24 mg/dL   Creatinine, Ser 0.71 0.57 - 1.00 mg/dL   GFR calc non Af Amer 102 >59 mL/min/1.73   GFR calc Af Amer 117 >59 mL/min/1.73   BUN/Creatinine Ratio 17 9 - 23   Sodium 141 136 - 144 mmol/L   Potassium 4.3 3.5 - 5.2 mmol/L   Chloride 103 97 - 106 mmol/L   CO2 23 18 - 29 mmol/L   Calcium 8.9 8.7 - 10.2 mg/dL   Total Protein 6.8 6.0 - 8.5 g/dL   Albumin 4.0 3.5 - 5.5 g/dL   Globulin, Total 2.8 1.5 - 4.5 g/dL   Albumin/Globulin Ratio 1.4 1.1 - 2.5   Bilirubin Total 0.2 0.0 - 1.2 mg/dL   Alkaline  Phosphatase 69 39 - 117 IU/L   AST 21 0 - 40 IU/L   ALT 33 (H) 0 - 32 IU/L  Magnesium  Result Value Ref Range   Magnesium 2.2 1.6 - 2.3 mg/dL      Assessment & Plan:   Problem List Items Addressed This Visit    None    Visit Diagnoses    Chest wall pain    -  Primary    Doubt pneumonia but will get chest x-ray.  I suspect Fibro flare.  She cannot take anything for pain due to upcoming bariatric surgery    Relevant Orders    DG Chest 2 View        Follow up plan: results

## 2015-04-17 ENCOUNTER — Ambulatory Visit
Admission: RE | Admit: 2015-04-17 | Discharge: 2015-04-17 | Disposition: A | Discharge status: Home / Self Care | Payer: BLUE CROSS/BLUE SHIELD | Source: Ambulatory Visit | Attending: Unknown Physician Specialty | Admitting: Unknown Physician Specialty

## 2015-04-17 ENCOUNTER — Encounter: Payer: Self-pay | Admitting: Unknown Physician Specialty

## 2015-04-17 ENCOUNTER — Ambulatory Visit (INDEPENDENT_AMBULATORY_CARE_PROVIDER_SITE_OTHER): Payer: BLUE CROSS/BLUE SHIELD | Admitting: Unknown Physician Specialty

## 2015-04-17 VITALS — BP 128/84 | HR 86 | Temp 97.4°F | Ht 67.7 in | Wt 317.0 lb

## 2015-04-17 DIAGNOSIS — R0602 Shortness of breath: Secondary | ICD-10-CM

## 2015-04-17 NOTE — Progress Notes (Signed)
BP 128/84 mmHg  Pulse 86  Temp(Src) 97.4 F (36.3 C)  Ht 5' 7.7" (1.72 m)  Wt 317 lb (143.79 kg)  BMI 48.60 kg/m2  SpO2 96%  LMP 04/15/2015 (Exact Date)   Subjective:    Patient ID: Cathy Mitchell, female    DOB: 08-11-1965, 50 y.o.   MRN: [id number]  HPI: Cathy Mitchell is a 49 y.o. female  Chief Complaint  Patient presents with  . Breathing Problem    pt states she is still having trouble breathing after her surgery   Pt is complaining of SOB with activity and is having some chest heaviness.  She hasn't gotten "sick" and was told before her surgery if she develops SOB to go get it checked out.  No chest wall pain.  No fever.    Relevant past medical, surgical, family and social history reviewed and updated as indicated. Interim medical history since our last visit reviewed. Allergies and medications reviewed and updated.  Review of Systems  Constitutional: Positive for activity change and appetite change.       Changes due to surgery  HENT: Negative.   Musculoskeletal: Negative.   Skin: Negative.   Psychiatric/Behavioral: Negative.     Per HPI unless specifically indicated above     Objective:    BP 128/84 mmHg  Pulse 86  Temp(Src) 97.4 F (36.3 C)  Ht 5' 7.7" (1.72 m)  Wt 317 lb (143.79 kg)  BMI 48.60 kg/m2  SpO2 96%  LMP 04/15/2015 (Exact Date)  Wt Readings from Last 3 Encounters:  04/17/15 317 lb (143.79 kg)  03/01/15 357 lb 3.2 oz (162.025 kg)  01/28/15 357 lb 12.8 oz (162.297 kg)    Physical Exam  Constitutional: She is oriented to person, place, and time. She appears well-developed and well-nourished. No distress.  HENT:  Head: Normocephalic and atraumatic.  Eyes: Conjunctivae and lids are normal. Right eye exhibits no discharge. Left eye exhibits no discharge. No scleral icterus.  Neck: Normal range of motion. Neck supple. No JVD present. Carotid bruit is not present.  Cardiovascular: Normal rate, regular rhythm and normal heart sounds.    Pulmonary/Chest: Effort normal and breath sounds normal. No accessory muscle usage. No respiratory distress. She has no decreased breath sounds. She has no wheezes. She has no rhonchi. She has no rales.  Abdominal: Normal appearance. There is no splenomegaly or hepatomegaly.  Musculoskeletal: Normal range of motion.  Neurological: She is alert and oriented to person, place, and time.  Skin: Skin is warm, dry and intact. No rash noted. No pallor.  Psychiatric: She has a normal mood and affect. Her behavior is normal. Judgment and thought content normal.   EKG without acute changes  Results for orders placed or performed in visit on 01/28/15  Comprehensive metabolic panel  Result Value Ref Range   Glucose 103 (H) 65 - 99 mg/dL   BUN 12 6 - 24 mg/dL   Creatinine, Ser 0.71 0.57 - 1.00 mg/dL   GFR calc non Af Amer 102 >59 mL/min/1.73   GFR calc Af Amer 117 >59 mL/min/1.73   BUN/Creatinine Ratio 17 9 - 23   Sodium 141 136 - 144 mmol/L   Potassium 4.3 3.5 - 5.2 mmol/L   Chloride 103 97 - 106 mmol/L   CO2 23 18 - 29 mmol/L   Calcium 8.9 8.7 - 10.2 mg/dL   Total Protein 6.8 6.0 - 8.5 g/dL   Albumin 4.0 3.5 - 5.5 g/dL   Globulin, Total 2.8  1.5 - 4.5 g/dL   Albumin/Globulin Ratio 1.4 1.1 - 2.5   Bilirubin Total 0.2 0.0 - 1.2 mg/dL   Alkaline Phosphatase 69 39 - 117 IU/L   AST 21 0 - 40 IU/L   ALT 33 (H) 0 - 32 IU/L  Magnesium  Result Value Ref Range   Magnesium 2.2 1.6 - 2.3 mg/dL      Assessment & Plan:   Problem List Items Addressed This Visit    None    Visit Diagnoses    SOB (shortness of breath)    -  Primary    Relevant Orders    EKG 12-Lead (Completed)    DG Chest 2 View    D-Dimer, Quantitative      Pt without any distress at this time.  Order d dimer anc chest x-ray.   If d dimer positive, needs a chest x-ray.  To the ER for acute SOB.    Follow up plan: Return for results.

## 2015-04-18 ENCOUNTER — Telehealth: Payer: Self-pay

## 2015-04-18 ENCOUNTER — Other Ambulatory Visit: Payer: Self-pay | Admitting: Unknown Physician Specialty

## 2015-04-18 ENCOUNTER — Other Ambulatory Visit: Payer: BLUE CROSS/BLUE SHIELD

## 2015-04-18 DIAGNOSIS — R0602 Shortness of breath: Secondary | ICD-10-CM

## 2015-04-18 DIAGNOSIS — Z0189 Encounter for other specified special examinations: Secondary | ICD-10-CM

## 2015-04-18 LAB — D-DIMER, QUANTITATIVE: D-DIMER: 0.64 mg/L FEU — ABNORMAL HIGH (ref 0.00–0.49)

## 2015-04-18 NOTE — Telephone Encounter (Signed)
Called patient to tell her of her imaging appointment tomorrow.  Patient then asked that she's been eliminating foods from her diet to find out what she's allergic to. But now she says she just wanted to be allergy tested because she feels she's no where close to answers and wants to know what to do.

## 2015-04-18 NOTE — Progress Notes (Signed)
Called pt to discuss her results.  Her chest x-ray was negative but her d dimer was elevated at .64.  Pt is unable to get a CT today due to car problems.  She can come get a BMP once she gets a rental.   Will order a CT angiogram for tomorrow.  To the ER with acute SOB.  Discussed plan of care with Dr. Wynetta Emery.

## 2015-04-19 ENCOUNTER — Ambulatory Visit
Admission: RE | Admit: 2015-04-19 | Discharge: 2015-04-19 | Disposition: A | Discharge status: Home / Self Care | Payer: BLUE CROSS/BLUE SHIELD | Source: Ambulatory Visit | Attending: Unknown Physician Specialty | Admitting: Unknown Physician Specialty

## 2015-04-19 ENCOUNTER — Ambulatory Visit: Admission: RE | Admit: 2015-04-19 | Payer: BLUE CROSS/BLUE SHIELD | Source: Ambulatory Visit

## 2015-04-19 ENCOUNTER — Other Ambulatory Visit: Payer: Self-pay | Admitting: Unknown Physician Specialty

## 2015-04-19 ENCOUNTER — Telehealth: Payer: Self-pay | Admitting: Unknown Physician Specialty

## 2015-04-19 ENCOUNTER — Ambulatory Visit: Payer: BLUE CROSS/BLUE SHIELD

## 2015-04-19 DIAGNOSIS — R791 Abnormal coagulation profile: Secondary | ICD-10-CM | POA: Diagnosis present

## 2015-04-19 DIAGNOSIS — R0602 Shortness of breath: Secondary | ICD-10-CM

## 2015-04-19 DIAGNOSIS — R0789 Other chest pain: Secondary | ICD-10-CM

## 2015-04-19 LAB — CBC WITH DIFFERENTIAL/PLATELET
BASOS ABS: 0.1 10*3/uL (ref 0.0–0.2)
Basos: 2 %
EOS (ABSOLUTE): 0.3 10*3/uL (ref 0.0–0.4)
EOS: 4 %
HEMATOCRIT: 41.9 % (ref 34.0–46.6)
Hemoglobin: 13.9 g/dL (ref 11.1–15.9)
IMMATURE GRANULOCYTES: 0 %
Immature Grans (Abs): 0 10*3/uL (ref 0.0–0.1)
Lymphocytes Absolute: 2.1 10*3/uL (ref 0.7–3.1)
Lymphs: 33 %
MCH: 29 pg (ref 26.6–33.0)
MCHC: 33.2 g/dL (ref 31.5–35.7)
MCV: 87 fL (ref 79–97)
MONOS ABS: 0.7 10*3/uL (ref 0.1–0.9)
Monocytes: 11 %
NEUTROS PCT: 50 %
Neutrophils Absolute: 3.3 10*3/uL (ref 1.4–7.0)
PLATELETS: 304 10*3/uL (ref 150–379)
RBC: 4.8 x10E6/uL (ref 3.77–5.28)
RDW: 14.5 % (ref 12.3–15.4)
WBC: 6.5 10*3/uL (ref 3.4–10.8)

## 2015-04-19 LAB — BASIC METABOLIC PANEL
BUN/Creatinine Ratio: 10 (ref 9–23)
BUN: 6 mg/dL (ref 6–24)
CHLORIDE: 100 mmol/L (ref 96–106)
CO2: 26 mmol/L (ref 18–29)
CREATININE: 0.62 mg/dL (ref 0.57–1.00)
Calcium: 9.2 mg/dL (ref 8.7–10.2)
GFR calc Af Amer: 123 mL/min/{1.73_m2} (ref >59)
GFR calc non Af Amer: 107 mL/min/{1.73_m2} (ref >59)
GLUCOSE: 98 mg/dL (ref 65–99)
Potassium: 4.2 mmol/L (ref 3.5–5.2)
SODIUM: 142 mmol/L (ref 134–144)

## 2015-04-19 MED ORDER — IOHEXOL 350 MG/ML SOLN
100.0000 mL | Freq: Once | INTRAVENOUS | Status: AC | PRN
  Administered 2015-04-19: 100 mL via INTRAVENOUS

## 2015-04-19 NOTE — Telephone Encounter (Signed)
Needs an appointment to discuss

## 2015-04-19 NOTE — Telephone Encounter (Signed)
Discussed with pt normal CT results.  Pt still complaining of chest heaviness.  Will refer to cardiology for further evaluation considering long standing abnormal EKG.  Of note, she had a stress test with Bruce protocol which was normal

## 2015-04-19 NOTE — Telephone Encounter (Signed)
Called patient on 11/19 to discuss getting a CT with angio due to elevated d dimer.  One was scheduled at 2p today.  CBC and CMP drawn yesterday.

## 2015-04-23 NOTE — Telephone Encounter (Signed)
Pt scheduled for 04/29/15 @ 8:45am. Thanks.

## 2015-04-29 ENCOUNTER — Ambulatory Visit (INDEPENDENT_AMBULATORY_CARE_PROVIDER_SITE_OTHER): Payer: BLUE CROSS/BLUE SHIELD | Admitting: Unknown Physician Specialty

## 2015-04-29 ENCOUNTER — Encounter: Payer: Self-pay | Admitting: Unknown Physician Specialty

## 2015-04-29 VITALS — BP 142/82 | HR 82 | Temp 97.5°F | Ht 68.2 in | Wt 314.4 lb

## 2015-04-29 DIAGNOSIS — R0602 Shortness of breath: Secondary | ICD-10-CM

## 2015-04-29 DIAGNOSIS — M797 Fibromyalgia: Secondary | ICD-10-CM

## 2015-04-29 DIAGNOSIS — L719 Rosacea, unspecified: Secondary | ICD-10-CM | POA: Diagnosis not present

## 2015-04-29 MED ORDER — METRONIDAZOLE 0.75 % EX CREA: TOPICAL_CREAM | Freq: Two times a day (BID) | CUTANEOUS | Status: DC

## 2015-04-29 NOTE — Assessment & Plan Note (Signed)
rx for Metronidazole

## 2015-04-29 NOTE — Assessment & Plan Note (Signed)
Handout on alternative treatments

## 2015-04-29 NOTE — Progress Notes (Signed)
BP 142/82 mmHg  Pulse 82  Temp(Src) 97.5 F (36.4 C)  Ht 5' 8.2" (1.732 m)  Wt 314 lb 6.4 oz (142.611 kg)  BMI 47.54 kg/m2  SpO2 96%  LMP 04/15/2015 (Exact Date)   Subjective:    Patient ID: Cathy Mitchell, female    DOB: [birth year], 50 y.o.   MRN: [id number]  HPI: Cathy Mitchell is a 50 y.o. female  Chief Complaint  Patient presents with  . Follow-up    pt states she doesn't know why she is here, according to last note, she is here for results but states she already got a phone call with results   SOB Pt here with f/u of her SOB.  She had a negative CT.  She has found a strong correlation with her vitamins given at the bariatric clinic and SOB and chest pressure.  She had a stress test before bariatric surgery but I scheduled an appointment with the cardiologist that she doesn't feel like she needs it.    Rosacea Breaking and facial burning ever since bariatric surgery  Arthritis Puts Tumeric in food.  Wants to know what else she can to    Relevant past medical, surgical, family and social history reviewed and updated as indicated. Interim medical history since our last visit reviewed. Allergies and medications reviewed and updated.  Review of Systems  Constitutional: Positive for fatigue.  HENT: Negative.   Eyes: Negative.   Respiratory: Positive for chest tightness.   Cardiovascular: Negative.   Gastrointestinal: Positive for vomiting.  Endocrine: Negative.   Genitourinary: Negative.   Musculoskeletal: Negative.   Skin: Negative.   Allergic/Immunologic: Negative.   Neurological: Negative.   Hematological: Negative.   Psychiatric/Behavioral: Negative.     Per HPI unless specifically indicated above     Objective:    BP 142/82 mmHg  Pulse 82  Temp(Src) 97.5 F (36.4 C)  Ht 5' 8.2" (1.732 m)  Wt 314 lb 6.4 oz (142.611 kg)  BMI 47.54 kg/m2  SpO2 96%  LMP 04/15/2015 (Exact Date)  Wt Readings from Last 3 Encounters:  04/29/15 314 lb 6.4 oz (142.611 kg)   04/17/15 317 lb (143.79 kg)  03/01/15 357 lb 3.2 oz (162.025 kg)    Physical Exam  Constitutional: She is oriented to person, place, and time. She appears well-developed and well-nourished. No distress.  HENT:  Head: Normocephalic and atraumatic.  Eyes: Conjunctivae and lids are normal. Right eye exhibits no discharge. Left eye exhibits no discharge. No scleral icterus.  Cardiovascular: Normal rate.   Pulmonary/Chest: Effort normal.  Abdominal: Normal appearance. There is no splenomegaly or hepatomegaly.  Musculoskeletal: Normal range of motion.  Neurological: She is alert and oriented to person, place, and time.  Skin: Skin is intact. No rash noted. No pallor.  Psychiatric: She has a normal mood and affect. Her behavior is normal. Judgment and thought content normal.    Results for orders placed or performed in visit on 123456  Basic Metabolic Panel (BMET)  Result Value Ref Range   Glucose 98 65 - 99 mg/dL   BUN 6 6 - 24 mg/dL   Creatinine, Ser 0.62 0.57 - 1.00 mg/dL   GFR calc non Af Amer 107 >59 mL/min/1.73   GFR calc Af Amer 123 >59 mL/min/1.73   BUN/Creatinine Ratio 10 9 - 23   Sodium 142 134 - 144 mmol/L   Potassium 4.2 3.5 - 5.2 mmol/L   Chloride 100 96 - 106 mmol/L   CO2 26 18 -  29 mmol/L   Calcium 9.2 8.7 - 10.2 mg/dL  CBC with Differential/Platelet  Result Value Ref Range   WBC 6.5 3.4 - 10.8 x10E3/uL   RBC 4.80 3.77 - 5.28 x10E6/uL   Hemoglobin 13.9 11.1 - 15.9 g/dL   Hematocrit 41.9 34.0 - 46.6 %   MCV 87 79 - 97 fL   MCH 29.0 26.6 - 33.0 pg   MCHC 33.2 31.5 - 35.7 g/dL   RDW 14.5 12.3 - 15.4 %   Platelets 304 150 - 379 x10E3/uL   Neutrophils 50 %   Lymphs 33 %   Monocytes 11 %   Eos 4 %   Basos 2 %   Neutrophils Absolute 3.3 1.4 - 7.0 x10E3/uL   Lymphocytes Absolute 2.1 0.7 - 3.1 x10E3/uL   Monocytes Absolute 0.7 0.1 - 0.9 x10E3/uL   EOS (ABSOLUTE) 0.3 0.0 - 0.4 x10E3/uL   Basophils Absolute 0.1 0.0 - 0.2 x10E3/uL   Immature Granulocytes 0 %    Immature Grans (Abs) 0.0 0.0 - 0.1 x10E3/uL      Assessment & Plan:   Problem List Items Addressed This Visit      Unprioritized   Fibromyalgia    Handout on alternative treatments      Rosacea - Primary    rx for Metronidazole       Other Visit Diagnoses    SOB (shortness of breath)        Seems to be resolved with stopping vitamin.  Discussed alternative ways to get Vitamin B12 and Vitamen D3       Vitamin D3 - Look for fortified food products plus sunlight Vitamin B12- I recommend sublingual  Follow up plan: Return in about 3 months (around 07/28/2015).

## 2015-04-29 NOTE — Patient Instructions (Addendum)
Vitamin D3 - Look for fortified food products plus sunlight Vitamin B12- I recommend sublingual  f you have chronic pain and are looking for alternatives to medication and surgery, you have a lot of options.   However, not all alternative pain treatments work. Some can even be risky. Some alternative treatments may help with pain from bad backs, osteoarthritis, and headaches, but have no effect on chronic pain from fibromyalgia or diabetic nerve damage.  Here's a rundown of the most commonly used alternative treatments for chronic pain.  Acupuncture. Once seen as bizarre, acupuncture is rapidly becoming a mainstream treatment for pain. Studies have found that it works for pain caused by many conditions, including fibromyalgia, osteoarthritis, back injuries, and sports injuries. How does it work? No one's quite sure. It could release pain-numbing chemicals in the body. Or it might block the pain signals coming from the nerves.  Exercise. Motion is lotion Going for a walk or swim isn't a treatment, exactly. But regular physical activity has big benefits for people with many different painful conditions. Study after study has found that physical activity can help relieve chronic pain, as well as boost energy and mood.   This is particularly true of pain related to arthritis .    Chiropractic manipulation. Although mainstream medicine has traditionally regarded spinal manipulation with suspicion, it's becoming a more accepted treatment.  I think many people respond will th chiropractic treatment.    Supplements and vitamins. There is evidence that certain dietary supplements and vitamins can help with certain types of pain. Fish oil and flaxseed oil is often used to reduce pain associated with swelling. Topical capsaicin, derived from chili peppers, may help with arthritis, diabetic nerve pain, and other conditions. There's evidence that glucosamine can help relieve moderate to severe pain from  osteoarthritis in the knee.  A spice Tumeric is used my many to treat chronic pain.  Curcumin is the active ingredient and thought to have anti-inflammatory effects and reduces pain and stiffness.  This can be found as capsules or extract (more likely to be free of contaminants). For OA: Capsule, typically 400 mg to 600 mg, three times per day; or 0.5 g to 1 g of powdered root up to 3 g per day. For RA: 500 mg twice daily.  It's best absorbed if it contains black pepper.  Look up how to make [employer]. Tart cherry juice is a natural anti-inflammatory agent.  We sometimes hear from people who would like to know where to find cherries out of season. Some report that their local market does not carry cherry juice. There are a number of reputable online vendors who could supply cherry concentrate so you can use cherry juice to see if it helps your arthritic joints. Studies have been done with powdered [place] cherries, CherryPURE. The usual dose is 480 mg/day. Manufacturers often combine several ingredients in 1 pill.  One of my patient is taking [employer] Fibro support she finds helpful.  Another I know is Zyflamed.  Please let me know if you find any products helpful for you that I can pass on to others  But when it comes to supplements, you have to be careful. High doses of B6 can damage the nerves.   Some studies suggest that supplements such as ginkgo biloba and ginseng can thin the blood and increase the risk of bleeding. This could lead to serious consequences for anyone getting surgery for chronic pain.  Finally, supplements don't always contain what they claim  as supplement manufacturers are not regulated by the FDA.  I get very confused with the thousands of supplements available and which to choose.  Brands to consider: [employer], Nature[employer], [employer], Garden of Life, [employer], Duwayne Heck and Tresa Garter   I usually go on [employer] and look for the highly rated products.  Althia Forts  [employer] or [employer] are great resources and have done the research for you.     Cognitive Behavioral Therapy. Some people with chronic pain balk at the idea of seeing a therapist -- they think it implies that their pain isn't real. But studies show that depression and chronic pain often go together. Chronic pain can cause or worsen depression; depression can lower a person's tolerance for pain.  Stress-reduction techniques. There are number of approaches, including: Yoga. There's good evidence that yoga can help with chronic pain,  specifically fibromyalgia, neck pain, back pain, and arthritis. Relaxation therapy. This is actually a category of techniques that help people calm the body and release tension -- a process that might also reduce pain. Some approaches teach people how to focus on their breathing. Research shows that relaxation therapy can help with fibromyalgia, headache, osteoarthritis, and other conditions. Hypnosis. Studies have found this approach helpful with different sorts of pain, like back pain, repetitive strain injuries, and cancer pain. Guided imagery. Research shows that guided imagery can help with conditions like headache pain, cancer pain, osteoarthritis, and fibromyalgia. How does it work? An expert would teach you ways to direct your thoughts by focusing on specific images. Music therapy. This approach gets people to either perform or listen to music. Studies have found that it can help with many different pain conditions, like osteoarthritis and cancer pain. Biofeedback. This approach teaches you how to control normally unconscious bodily functions, like blood pressure or your heart rate. Studies have found that it can help with headaches, fibromyalgia, and other conditions. Massage. It's undeniably relaxing. And there's some evidence that massage can help ease pain from rheumatoid arthritis, neck and back injuries, and fibromyalgia.  Risky Alternative Pain  Treatments Experts say you should keep your expectations for alternative pain treatments modest -- especially when it comes to "miracle cures." Controlling chronic pain is not simple. A single supplement, device, or treatment is not going to make your chronic pain disappear. You a need to be suspicious of anyone pushing a treatment when the financial motive is blatant. That doesn't only apply to pain treatments advertised on dubious web sites asking for your credit card number.  Experts say that you should try to keep up to date with research into alternative treatments for chronic pain. The options for people with chronic pain are always growing -- and some of the older treatments of today might become the mainstream treatments of tomorrow.

## 2015-05-21 ENCOUNTER — Other Ambulatory Visit: Payer: Self-pay | Admitting: Bariatrics

## 2015-05-21 DIAGNOSIS — R1310 Dysphagia, unspecified: Secondary | ICD-10-CM

## 2015-05-28 ENCOUNTER — Ambulatory Visit: Payer: BLUE CROSS/BLUE SHIELD | Attending: Bariatrics

## 2015-07-31 ENCOUNTER — Ambulatory Visit (INDEPENDENT_AMBULATORY_CARE_PROVIDER_SITE_OTHER): Payer: BLUE CROSS/BLUE SHIELD | Admitting: Unknown Physician Specialty

## 2015-07-31 ENCOUNTER — Encounter: Payer: Self-pay | Admitting: Unknown Physician Specialty

## 2015-07-31 VITALS — BP 117/82 | HR 92 | Temp 97.8°F | Ht 68.1 in | Wt 289.6 lb

## 2015-07-31 DIAGNOSIS — M25559 Pain in unspecified hip: Secondary | ICD-10-CM | POA: Diagnosis not present

## 2015-07-31 DIAGNOSIS — M25561 Pain in right knee: Secondary | ICD-10-CM | POA: Diagnosis not present

## 2015-07-31 DIAGNOSIS — G4700 Insomnia, unspecified: Secondary | ICD-10-CM

## 2015-07-31 DIAGNOSIS — R5383 Other fatigue: Secondary | ICD-10-CM | POA: Diagnosis not present

## 2015-07-31 DIAGNOSIS — M25562 Pain in left knee: Secondary | ICD-10-CM | POA: Diagnosis not present

## 2015-07-31 DIAGNOSIS — I499 Cardiac arrhythmia, unspecified: Secondary | ICD-10-CM

## 2015-07-31 DIAGNOSIS — Z0000 Encounter for general adult medical examination without abnormal findings: Secondary | ICD-10-CM

## 2015-07-31 LAB — CBC WITH DIFFERENTIAL/PLATELET
HEMATOCRIT: 40 % (ref 34.0–46.6)
HEMOGLOBIN: 13.6 g/dL (ref 11.1–15.9)
LYMPHS ABS: 2.4 10*3/uL (ref 0.7–3.1)
LYMPHS: 27 %
MCH: 29.5 pg (ref 26.6–33.0)
MCHC: 34 g/dL (ref 31.5–35.7)
MCV: 87 fL (ref 79–97)
MID (Absolute): 0.7 10*3/uL (ref 0.1–1.6)
MID: 8 %
NEUTROS PCT: 65 %
Neutrophils Absolute: 5.6 10*3/uL (ref 1.4–7.0)
Platelets: 338 10*3/uL (ref 150–379)
RBC: 4.61 x10E6/uL (ref 3.77–5.28)
RDW: 13.5 % (ref 12.3–15.4)
WBC: 8.7 10*3/uL (ref 3.4–10.8)

## 2015-07-31 LAB — UA/M W/RFLX CULTURE, ROUTINE
Bilirubin, UA: NEGATIVE
Glucose, UA: NEGATIVE
Ketones, UA: NEGATIVE
Leukocytes, UA: NEGATIVE
Nitrite, UA: NEGATIVE
PH UA: 5.5 (ref 5.0–7.5)
PROTEIN UA: NEGATIVE
RBC, UA: NEGATIVE
Specific Gravity, UA: 1.015 (ref 1.005–1.030)
Urobilinogen, Ur: 0.2 mg/dL (ref 0.2–1.0)

## 2015-07-31 NOTE — Progress Notes (Signed)
BP 117/82 mmHg  Pulse 92  Temp(Src) 97.8 F (36.6 C)  Ht 5' 8.1" (1.73 m)  Wt 289 lb 9.6 oz (131.362 kg)  BMI 43.89 kg/m2  SpO2 98%   Subjective:    Patient ID: Cathy Mitchell, female    DOB: 03-Apr-1965, 50 y.o.   MRN: [id number]  HPI: Cathy Mitchell is a 50 y.o. female  Chief Complaint  Patient presents with  . Follow-up    pt states she is here for f/u, states she feels like her sodium is bottoming out   She states before she had gastric bypass she thought she was having a stroke but found she was hyponatremic.  States she has had similar symptoms due to similar symptoms.  She can't sleep and is having fatigue.  She has a headache.  She states she is having "crippling" hip and knee pain.  She goes to the gym 6 days a week.  She claims the only treatment for her is through an IV.   She is taking no medications other than vitamins prescribed through bariatric surgery.  She takes no laxatives.  She has had diarrhea for 3 days of "pure liquid" 3 times/day.  She drinks large amounts of water and drink 8-10 17 oz bottles of water daily.     Relevant past medical, surgical, family and social history reviewed and updated as indicated. Interim medical history since our last visit reviewed. Allergies and medications reviewed and updated.  Review of Systems  Musculoskeletal:       Hip and knee pain.  States it "is not Fibromyalgia" and making it hard to work out.      Per HPI unless specifically indicated above     Objective:    BP 117/82 mmHg  Pulse 92  Temp(Src) 97.8 F (36.6 C)  Ht 5' 8.1" (1.73 m)  Wt 289 lb 9.6 oz (131.362 kg)  BMI 43.89 kg/m2  SpO2 98%  Wt Readings from Last 3 Encounters:  07/31/15 289 lb 9.6 oz (131.362 kg)  04/29/15 314 lb 6.4 oz (142.611 kg)  04/17/15 317 lb (143.79 kg)    Physical Exam  Constitutional: She is oriented to person, place, and time. She appears well-developed and well-nourished. No distress.  HENT:  Head: Normocephalic and  atraumatic.  Eyes: Conjunctivae and lids are normal. Right eye exhibits no discharge. Left eye exhibits no discharge. No scleral icterus.  Neck: Normal range of motion. Neck supple. No JVD present. Carotid bruit is not present.  Cardiovascular: Normal rate and normal heart sounds.  An irregular rhythm present.  EKG is normal without any acute changes  Pulmonary/Chest: Effort normal and breath sounds normal.  Abdominal: Soft. Normal appearance. There is no splenomegaly or hepatomegaly. There is no tenderness.  Musculoskeletal: Normal range of motion.  Neurological: She is alert and oriented to person, place, and time.  Skin: Skin is warm, dry and intact. No rash noted. No pallor.  Psychiatric: She has a normal mood and affect. Her behavior is normal. Judgment and thought content normal.    Results for orders placed or performed in visit on 123456  Basic Metabolic Panel (BMET)  Result Value Ref Range   Glucose 98 65 - 99 mg/dL   BUN 6 6 - 24 mg/dL   Creatinine, Ser 0.62 0.57 - 1.00 mg/dL   GFR calc non Af Amer 107 >59 mL/min/1.73   GFR calc Af Amer 123 >59 mL/min/1.73   BUN/Creatinine Ratio 10 9 - 23   Sodium  142 134 - 144 mmol/L   Potassium 4.2 3.5 - 5.2 mmol/L   Chloride 100 96 - 106 mmol/L   CO2 26 18 - 29 mmol/L   Calcium 9.2 8.7 - 10.2 mg/dL  CBC with Differential/Platelet  Result Value Ref Range   WBC 6.5 3.4 - 10.8 x10E3/uL   RBC 4.80 3.77 - 5.28 x10E6/uL   Hemoglobin 13.9 11.1 - 15.9 g/dL   Hematocrit 41.9 34.0 - 46.6 %   MCV 87 79 - 97 fL   MCH 29.0 26.6 - 33.0 pg   MCHC 33.2 31.5 - 35.7 g/dL   RDW 14.5 12.3 - 15.4 %   Platelets 304 150 - 379 x10E3/uL   Neutrophils 50 %   Lymphs 33 %   Monocytes 11 %   Eos 4 %   Basos 2 %   Neutrophils Absolute 3.3 1.4 - 7.0 x10E3/uL   Lymphocytes Absolute 2.1 0.7 - 3.1 x10E3/uL   Monocytes Absolute 0.7 0.1 - 0.9 x10E3/uL   EOS (ABSOLUTE) 0.3 0.0 - 0.4 x10E3/uL   Basophils Absolute 0.1 0.0 - 0.2 x10E3/uL   Immature  Granulocytes 0 %   Immature Grans (Abs) 0.0 0.0 - 0.1 x10E3/uL      Assessment & Plan:   Problem List Items Addressed This Visit    None    Visit Diagnoses    Health care maintenance    -  Primary    Relevant Orders    HIV antibody    Irregular heart rate        EKG was normal    Relevant Orders    EKG 12-Lead (Completed)    Comprehensive metabolic panel    TSH    Other fatigue        Relevant Orders    CBC With Differential/Platelet    Comprehensive metabolic panel    TSH    UA/M w/rflx Culture, Routine    Insomnia        Having hot flashes.  Started having periods again    Arthralgia of both knees        Relevant Orders    DG Knee Complete 4 Views Right    DG Knee Complete 4 Views Left    Hip pain, unspecified laterality        Relevant Orders    DG HIPS BILAT WITH PELVIS 2V        Follow up plan: Return for results tomorrow.  Marland Kitchen

## 2015-08-01 ENCOUNTER — Ambulatory Visit
Admission: RE | Admit: 2015-08-01 | Discharge: 2015-08-01 | Disposition: A | Discharge status: Home / Self Care | Payer: BLUE CROSS/BLUE SHIELD | Source: Ambulatory Visit | Attending: Unknown Physician Specialty | Admitting: Unknown Physician Specialty

## 2015-08-01 DIAGNOSIS — M25562 Pain in left knee: Secondary | ICD-10-CM | POA: Diagnosis not present

## 2015-08-01 DIAGNOSIS — M25561 Pain in right knee: Secondary | ICD-10-CM | POA: Insufficient documentation

## 2015-08-01 DIAGNOSIS — M25551 Pain in right hip: Secondary | ICD-10-CM | POA: Insufficient documentation

## 2015-08-01 DIAGNOSIS — M25559 Pain in unspecified hip: Secondary | ICD-10-CM

## 2015-08-01 DIAGNOSIS — M179 Osteoarthritis of knee, unspecified: Secondary | ICD-10-CM | POA: Diagnosis not present

## 2015-08-01 DIAGNOSIS — M25552 Pain in left hip: Secondary | ICD-10-CM | POA: Insufficient documentation

## 2015-08-01 LAB — COMPREHENSIVE METABOLIC PANEL
ALBUMIN: 4.3 g/dL (ref 3.5–5.5)
ALT: 13 IU/L (ref 0–32)
AST: 10 IU/L (ref 0–40)
Albumin/Globulin Ratio: 1.6 (ref 1.2–2.2)
Alkaline Phosphatase: 76 IU/L (ref 39–117)
BUN / CREAT RATIO: 26 — AB (ref 9–23)
BUN: 18 mg/dL (ref 6–24)
Bilirubin Total: 0.2 mg/dL (ref 0.0–1.2)
CALCIUM: 9.4 mg/dL (ref 8.7–10.2)
CO2: 25 mmol/L (ref 18–29)
CREATININE: 0.69 mg/dL (ref 0.57–1.00)
Chloride: 99 mmol/L (ref 96–106)
GFR calc Af Amer: 119 mL/min/{1.73_m2} (ref >59)
GFR calc non Af Amer: 103 mL/min/{1.73_m2} (ref >59)
GLOBULIN, TOTAL: 2.7 g/dL (ref 1.5–4.5)
Glucose: 89 mg/dL (ref 65–99)
Potassium: 4.3 mmol/L (ref 3.5–5.2)
SODIUM: 138 mmol/L (ref 134–144)
Total Protein: 7 g/dL (ref 6.0–8.5)

## 2015-08-01 LAB — HIV ANTIBODY (ROUTINE TESTING W REFLEX): HIV Screen 4th Generation wRfx: NONREACTIVE

## 2015-08-01 LAB — TSH: TSH: 1.62 u[IU]/mL (ref 0.450–4.500)

## 2015-08-02 ENCOUNTER — Other Ambulatory Visit: Payer: Self-pay | Admitting: Unknown Physician Specialty

## 2015-08-02 DIAGNOSIS — M25561 Pain in right knee: Secondary | ICD-10-CM

## 2015-08-02 DIAGNOSIS — M25562 Pain in left knee: Principal | ICD-10-CM

## 2015-08-21 DIAGNOSIS — M170 Bilateral primary osteoarthritis of knee: Secondary | ICD-10-CM | POA: Diagnosis not present

## 2015-08-22 ENCOUNTER — Other Ambulatory Visit: Payer: Self-pay | Admitting: Orthopedic Surgery

## 2015-08-22 DIAGNOSIS — M1711 Unilateral primary osteoarthritis, right knee: Secondary | ICD-10-CM

## 2015-08-22 DIAGNOSIS — M1712 Unilateral primary osteoarthritis, left knee: Secondary | ICD-10-CM

## 2015-08-22 DIAGNOSIS — M2341 Loose body in knee, right knee: Secondary | ICD-10-CM

## 2015-08-23 ENCOUNTER — Other Ambulatory Visit (HOSPITAL_COMMUNITY): Payer: Self-pay | Admitting: Orthopedic Surgery

## 2015-09-18 ENCOUNTER — Ambulatory Visit
Admission: RE | Admit: 2015-09-18 | Discharge: 2015-09-18 | Disposition: A | Discharge status: Home / Self Care | Payer: BLUE CROSS/BLUE SHIELD | Source: Ambulatory Visit | Attending: Orthopedic Surgery | Admitting: Orthopedic Surgery

## 2015-09-18 DIAGNOSIS — X58XXXA Exposure to other specified factors, initial encounter: Secondary | ICD-10-CM | POA: Insufficient documentation

## 2015-09-18 DIAGNOSIS — M1711 Unilateral primary osteoarthritis, right knee: Secondary | ICD-10-CM | POA: Diagnosis not present

## 2015-09-18 DIAGNOSIS — R296 Repeated falls: Secondary | ICD-10-CM | POA: Diagnosis not present

## 2015-09-18 DIAGNOSIS — S83281A Other tear of lateral meniscus, current injury, right knee, initial encounter: Secondary | ICD-10-CM | POA: Insufficient documentation

## 2015-09-18 DIAGNOSIS — M2341 Loose body in knee, right knee: Secondary | ICD-10-CM | POA: Diagnosis not present

## 2015-09-18 DIAGNOSIS — M1712 Unilateral primary osteoarthritis, left knee: Secondary | ICD-10-CM

## 2015-09-20 ENCOUNTER — Other Ambulatory Visit: Payer: Self-pay | Admitting: Orthopedic Surgery

## 2015-09-20 DIAGNOSIS — M1711 Unilateral primary osteoarthritis, right knee: Secondary | ICD-10-CM

## 2015-09-20 DIAGNOSIS — M2341 Loose body in knee, right knee: Secondary | ICD-10-CM

## 2015-09-25 DIAGNOSIS — K912 Postsurgical malabsorption, not elsewhere classified: Secondary | ICD-10-CM | POA: Diagnosis not present

## 2015-09-25 DIAGNOSIS — Z9884 Bariatric surgery status: Secondary | ICD-10-CM | POA: Diagnosis not present

## 2015-10-04 DIAGNOSIS — M67431 Ganglion, right wrist: Secondary | ICD-10-CM | POA: Diagnosis not present

## 2015-10-04 DIAGNOSIS — M25531 Pain in right wrist: Secondary | ICD-10-CM | POA: Diagnosis not present

## 2015-10-07 ENCOUNTER — Encounter
Admission: RE | Admit: 2015-10-07 | Discharge: 2015-10-07 | Disposition: A | Discharge status: Home / Self Care | Payer: BLUE CROSS/BLUE SHIELD | Source: Ambulatory Visit | Attending: Orthopedic Surgery | Admitting: Orthopedic Surgery

## 2015-10-07 DIAGNOSIS — M1711 Unilateral primary osteoarthritis, right knee: Secondary | ICD-10-CM | POA: Diagnosis not present

## 2015-10-07 DIAGNOSIS — I10 Essential (primary) hypertension: Secondary | ICD-10-CM | POA: Diagnosis not present

## 2015-10-07 DIAGNOSIS — Z885 Allergy status to narcotic agent status: Secondary | ICD-10-CM | POA: Diagnosis not present

## 2015-10-07 DIAGNOSIS — Z8673 Personal history of transient ischemic attack (TIA), and cerebral infarction without residual deficits: Secondary | ICD-10-CM | POA: Diagnosis not present

## 2015-10-07 DIAGNOSIS — M797 Fibromyalgia: Secondary | ICD-10-CM | POA: Diagnosis not present

## 2015-10-07 DIAGNOSIS — Z9884 Bariatric surgery status: Secondary | ICD-10-CM | POA: Diagnosis not present

## 2015-10-07 DIAGNOSIS — Z825 Family history of asthma and other chronic lower respiratory diseases: Secondary | ICD-10-CM | POA: Diagnosis not present

## 2015-10-07 DIAGNOSIS — M67431 Ganglion, right wrist: Secondary | ICD-10-CM | POA: Diagnosis not present

## 2015-10-07 DIAGNOSIS — Z79899 Other long term (current) drug therapy: Secondary | ICD-10-CM | POA: Diagnosis not present

## 2015-10-07 DIAGNOSIS — E669 Obesity, unspecified: Secondary | ICD-10-CM | POA: Diagnosis not present

## 2015-10-07 DIAGNOSIS — I252 Old myocardial infarction: Secondary | ICD-10-CM | POA: Diagnosis not present

## 2015-10-07 HISTORY — DX: Unspecified osteoarthritis, unspecified site: M19.90

## 2015-10-07 HISTORY — DX: Fibromyalgia: M79.7

## 2015-10-07 NOTE — Patient Instructions (Signed)
  Your procedure is scheduled on: October 08, 2015 (Tuesday) Report to Same Day Surgery 2nd floor [facility] To find out your arrival time please call [phone] between 1PM - 3PM on ARRIVAL TIME 6:00 AM  Remember: Instructions that are not followed completely may result in serious medical risk, up to and including death, or upon the discretion of your surgeon and anesthesiologist your surgery may need to be rescheduled.    _x___ 1. Do not eat food or drink liquids after midnight. No gum chewing or hard candies.     __x__ 2. No Alcohol for 24 hours before or after surgery.   __x__3. No Smoking for 24 prior to surgery.   ____  4. Bring all medications with you on the day of surgery if instructed.    __x__ 5. Notify your doctor if there is any change in your medical condition     (cold, fever, infections).     Do not wear jewelry, make-up, hairpins, clips or nail polish.  Do not wear lotions, powders, or perfumes. You may wear deodorant.  Do not shave 48 hours prior to surgery. Men may shave face and neck.  Do not bring valuables to the hospital.    [facility] is not responsible for any belongings or valuables.               Contacts, dentures or bridgework may not be worn into surgery.  Leave your suitcase in the car. After surgery it may be brought to your room.  For patients admitted to the hospital, discharge time is determined by your treatment team.   Patients discharged the day of surgery will not be allowed to drive home.    Please read over the following fact sheets that you were given:   [facility] Preparing for Surgery and or MRSA Information   _x___ Take these medicines the morning of surgery with A SIP OF WATER:    1.   2.  3.  4.  5.  6.  ____ Fleet Enema (as directed)   _x___ Use CHG Soap or sage wipes as directed on instruction sheet   ____ Use inhalers on the day of surgery and bring to hospital day of surgery  ____ Stop metformin 2 days prior to  surgery    ____ Take 1/2 of usual insulin dose the night before surgery and none on the morning of surgery        _x___ Stop aspirin or coumadin, or plavix (N/A)  _x__ Stop Anti-inflammatories such as Advil, Aleve, Ibuprofen, Motrin, Naproxen,          Naprosyn, Goodies powders or aspirin products. Ok to take Tylenol.   _x__ Stop supplements until after surgery.  (STOP ALL SUPPLEMENTS NOW)  ____ Bring C-Pap to the hospital. (Patient stated she does not use C-PAP)

## 2015-10-08 ENCOUNTER — Encounter
Admission: RE | Disposition: A | Discharge status: Home / Self Care | Payer: Self-pay | Source: Ambulatory Visit | Attending: Orthopedic Surgery

## 2015-10-08 ENCOUNTER — Encounter: Payer: Self-pay | Admitting: *Deleted

## 2015-10-08 ENCOUNTER — Ambulatory Visit
Admission: RE | Admit: 2015-10-08 | Discharge: 2015-10-08 | Disposition: A | Discharge status: Home / Self Care | Payer: BLUE CROSS/BLUE SHIELD | Source: Ambulatory Visit | Attending: Orthopedic Surgery | Admitting: Orthopedic Surgery

## 2015-10-08 ENCOUNTER — Ambulatory Visit: Payer: BLUE CROSS/BLUE SHIELD | Admitting: Anesthesiology

## 2015-10-08 DIAGNOSIS — M797 Fibromyalgia: Secondary | ICD-10-CM | POA: Insufficient documentation

## 2015-10-08 DIAGNOSIS — Z825 Family history of asthma and other chronic lower respiratory diseases: Secondary | ICD-10-CM | POA: Insufficient documentation

## 2015-10-08 DIAGNOSIS — M67431 Ganglion, right wrist: Secondary | ICD-10-CM | POA: Insufficient documentation

## 2015-10-08 DIAGNOSIS — Z9884 Bariatric surgery status: Secondary | ICD-10-CM | POA: Insufficient documentation

## 2015-10-08 DIAGNOSIS — Z8673 Personal history of transient ischemic attack (TIA), and cerebral infarction without residual deficits: Secondary | ICD-10-CM | POA: Insufficient documentation

## 2015-10-08 DIAGNOSIS — Z885 Allergy status to narcotic agent status: Secondary | ICD-10-CM | POA: Insufficient documentation

## 2015-10-08 DIAGNOSIS — Z79899 Other long term (current) drug therapy: Secondary | ICD-10-CM | POA: Insufficient documentation

## 2015-10-08 DIAGNOSIS — I252 Old myocardial infarction: Secondary | ICD-10-CM | POA: Diagnosis not present

## 2015-10-08 DIAGNOSIS — E669 Obesity, unspecified: Secondary | ICD-10-CM | POA: Insufficient documentation

## 2015-10-08 DIAGNOSIS — F419 Anxiety disorder, unspecified: Secondary | ICD-10-CM | POA: Diagnosis not present

## 2015-10-08 DIAGNOSIS — I10 Essential (primary) hypertension: Secondary | ICD-10-CM | POA: Diagnosis not present

## 2015-10-08 DIAGNOSIS — G4730 Sleep apnea, unspecified: Secondary | ICD-10-CM | POA: Diagnosis not present

## 2015-10-08 HISTORY — PX: GANGLION CYST EXCISION: SHX1691

## 2015-10-08 LAB — POCT PREGNANCY, URINE: Preg Test, Ur: NEGATIVE

## 2015-10-08 SURGERY — EXCISION, GANGLION CYST, WRIST
Anesthesia: General | Laterality: Right | Wound class: Clean

## 2015-10-08 MED ORDER — ONDANSETRON HCL 4 MG/2ML IJ SOLN: 4.0000 mg | Freq: Four times a day (QID) | INTRAMUSCULAR | Status: DC | PRN

## 2015-10-08 MED ORDER — KETOROLAC TROMETHAMINE 30 MG/ML IJ SOLN
INTRAMUSCULAR | Status: DC | PRN
  Administered 2015-10-08: 30 mg via INTRAVENOUS

## 2015-10-08 MED ORDER — MIDAZOLAM HCL 2 MG/2ML IJ SOLN
INTRAMUSCULAR | Status: DC | PRN
  Administered 2015-10-08: 2 mg via INTRAVENOUS

## 2015-10-08 MED ORDER — KETAMINE HCL 50 MG/ML IJ SOLN
INTRAMUSCULAR | Status: DC | PRN
  Administered 2015-10-08 (×2): 25 mg via INTRAVENOUS

## 2015-10-08 MED ORDER — ONDANSETRON HCL 4 MG/2ML IJ SOLN
INTRAMUSCULAR | Status: DC | PRN
  Administered 2015-10-08: 4 mg via INTRAVENOUS

## 2015-10-08 MED ORDER — METOCLOPRAMIDE HCL 5 MG/ML IJ SOLN: 5.0000 mg | Freq: Three times a day (TID) | INTRAMUSCULAR | Status: DC | PRN

## 2015-10-08 MED ORDER — ACETAMINOPHEN 10 MG/ML IV SOLN
INTRAVENOUS | Status: AC
  Filled 2015-10-08: qty 100

## 2015-10-08 MED ORDER — DEXTROSE 5 % IV SOLN
3.0000 g | Freq: Once | INTRAVENOUS | Status: AC
  Administered 2015-10-08: 3 g via INTRAVENOUS
  Filled 2015-10-08: qty 3000

## 2015-10-08 MED ORDER — ONDANSETRON HCL 4 MG PO TABS: 4.0000 mg | ORAL_TABLET | Freq: Four times a day (QID) | ORAL | Status: DC | PRN

## 2015-10-08 MED ORDER — LACTATED RINGERS IV SOLN
INTRAVENOUS | Status: DC
  Administered 2015-10-08: 07:00:00 via INTRAVENOUS

## 2015-10-08 MED ORDER — SODIUM CHLORIDE 0.9 % IV SOLN: INTRAVENOUS | Status: DC

## 2015-10-08 MED ORDER — LIDOCAINE HCL (PF) 0.5 % IJ SOLN
INTRAMUSCULAR | Status: AC
  Filled 2015-10-08: qty 50

## 2015-10-08 MED ORDER — LIDOCAINE HCL (CARDIAC) 20 MG/ML IV SOLN
INTRAVENOUS | Status: DC | PRN
  Administered 2015-10-08: 50 mg via INTRAVENOUS

## 2015-10-08 MED ORDER — DEXAMETHASONE SODIUM PHOSPHATE 10 MG/ML IJ SOLN
INTRAMUSCULAR | Status: DC | PRN
  Administered 2015-10-08: 10 mg via INTRAVENOUS

## 2015-10-08 MED ORDER — GLYCOPYRROLATE 0.2 MG/ML IJ SOLN
INTRAMUSCULAR | Status: DC | PRN
  Administered 2015-10-08: 0.2 mg via INTRAVENOUS

## 2015-10-08 MED ORDER — TRAMADOL HCL 50 MG PO TABS: 100.0000 mg | ORAL_TABLET | Freq: Four times a day (QID) | ORAL | Status: DC | PRN

## 2015-10-08 MED ORDER — METOCLOPRAMIDE HCL 10 MG PO TABS: 5.0000 mg | ORAL_TABLET | Freq: Three times a day (TID) | ORAL | Status: DC | PRN

## 2015-10-08 MED ORDER — SUCCINYLCHOLINE CHLORIDE 20 MG/ML IJ SOLN
INTRAMUSCULAR | Status: DC | PRN
  Administered 2015-10-08: 120 mg via INTRAVENOUS

## 2015-10-08 MED ORDER — FENTANYL CITRATE (PF) 100 MCG/2ML IJ SOLN: 25.0000 ug | INTRAMUSCULAR | Status: DC | PRN

## 2015-10-08 MED ORDER — PROPOFOL 10 MG/ML IV BOLUS
INTRAVENOUS | Status: DC | PRN
  Administered 2015-10-08: 200 mg via INTRAVENOUS

## 2015-10-08 MED ORDER — ONDANSETRON HCL 4 MG/2ML IJ SOLN: 4.0000 mg | Freq: Once | INTRAMUSCULAR | Status: DC | PRN

## 2015-10-08 MED ORDER — BUPIVACAINE HCL (PF) 0.5 % IJ SOLN
INTRAMUSCULAR | Status: AC
  Filled 2015-10-08: qty 30

## 2015-10-08 MED ORDER — ACETAMINOPHEN 10 MG/ML IV SOLN
INTRAVENOUS | Status: DC | PRN
  Administered 2015-10-08: 1000 mg via INTRAVENOUS

## 2015-10-08 SURGICAL SUPPLY — 30 items
BANDAGE ELASTIC 3 LF NS (GAUZE/BANDAGES/DRESSINGS) ×2 IMPLANT
BNDG ESMARK 4X12 TAN STRL LF (GAUZE/BANDAGES/DRESSINGS) ×2 IMPLANT
CANISTER SUCT 1200ML W/VALVE (MISCELLANEOUS) ×2 IMPLANT
CAST PADDING 3X4FT ST 30246 (SOFTGOODS) ×1
CHLORAPREP W/TINT 26ML (MISCELLANEOUS) ×2 IMPLANT
CUFF TOURN 18 STER (MISCELLANEOUS) IMPLANT
ELECT CAUTERY NEEDLE 2.0 MIC (NEEDLE) ×2 IMPLANT
GAUZE PETRO XEROFOAM 1X8 (MISCELLANEOUS) ×2 IMPLANT
GAUZE SPONGE 4X4 12PLY STRL (GAUZE/BANDAGES/DRESSINGS) ×2 IMPLANT
GLOVE BIOGEL PI IND STRL 9 (GLOVE) ×1 IMPLANT
GLOVE BIOGEL PI INDICATOR 9 (GLOVE) ×1
GLOVE SURG ORTHO 9.0 STRL STRW (GLOVE) ×2 IMPLANT
GOWN SRG 2XL LVL 4 RGLN SLV (GOWNS) ×1 IMPLANT
GOWN STRL NON-REIN 2XL LVL4 (GOWNS) ×1
GOWN STRL REUS W/ TWL LRG LVL3 (GOWN DISPOSABLE) ×1 IMPLANT
GOWN STRL REUS W/TWL LRG LVL3 (GOWN DISPOSABLE) ×1
KIT RM TURNOVER STRD PROC AR (KITS) ×2 IMPLANT
NEEDLE HYPO 25X1 1.5 SAFETY (NEEDLE) ×2 IMPLANT
NS IRRIG 500ML POUR BTL (IV SOLUTION) ×2 IMPLANT
PACK EXTREMITY ARMC (MISCELLANEOUS) ×2 IMPLANT
PAD CAST CTTN 3X4 STRL (SOFTGOODS) ×1 IMPLANT
SPLINT CAST 1 STEP 3X12 (MISCELLANEOUS) ×2 IMPLANT
STOCKINETTE STRL 4IN 9604848 (GAUZE/BANDAGES/DRESSINGS) ×2 IMPLANT
SUT ETHILON 4-0 (SUTURE) ×1
SUT ETHILON 4-0 FS2 18XMFL BLK (SUTURE) ×1
SUT ETHILON 5-0 FS-2 18 BLK (SUTURE) ×2 IMPLANT
SUT MNCRL 4-0 (SUTURE) ×1
SUT MNCRL 4-0 27XMFL (SUTURE) ×1
SUTURE ETHLN 4-0 FS2 18XMF BLK (SUTURE) ×1 IMPLANT
SUTURE MNCRL 4-0 27XMF (SUTURE) ×1 IMPLANT

## 2015-10-08 NOTE — Op Note (Signed)
10/08/2015  8:20 AM  PATIENT:  Cathy Mitchell  50 y.o. female  PRE-OPERATIVE DIAGNOSIS:  ganglion cyst right wrist  POST-OPERATIVE DIAGNOSIS:  ganglion cyst right wrist  PROCEDURE:  Procedure(s): REMOVAL GANGLION OF WRIST (Right)  SURGEON: Laurene Footman, MD  ASSISTANTS: none  ANESTHESIA:   general  EBL:  Total I/O In: 500 [I.V.:500] Out: -   BLOOD ADMINISTERED:none  DRAINS: none   LOCAL MEDICATIONS USED:  MARCAINE     SPECIMEN:  Source of Specimen:  dorsal wrist ganglion  DISPOSITION OF SPECIMEN:  PATHOLOGY  COUNTS:  YES  TOURNIQUET:  15 min at 275 mm Hg  IMPLANTS: none  DICTATION: .Dragon Dictation patient brought the operating room and after adequate general anesthesia was obtained, right arm was prepped and draped in sterile fashion. After appropriate patient identification and timeout procedures were completed, tourniquet was raised. Utilizing the old scar a transverse incision was made on the dorsum of the wrist and subcutaneous tissue spread. The extensor tendons were protected and the ganglion exposed centrally. It measured approximately 2 cm in diameter. It was separated from the surrounding tissue and then removed and sent as a specimen. The base of the ganglion was then debrided and cautery used to try to cause scar tissue to seal the gap caused by the ganglion cyst. The wound was thoroughly irrigated and injected with 10 cc half percent Sensorcaine to aid in postop analgesia. Wounds closed with simple interrupted 4-0 nylon skin suture followed by application of a sterile dressing of Xeroform 4 x 4 web roll volar splint and Ace wrap with tourniquet let down prior to closure  PLAN OF CARE: Discharge to home after PACU  PATIENT DISPOSITION:  PACU - hemodynamically stable.

## 2015-10-08 NOTE — Anesthesia Preprocedure Evaluation (Signed)
Anesthesia Evaluation  Patient identified by MRN, date of birth, ID band Patient awake    Reviewed: Allergy & Precautions, NPO status , Patient's Chart, lab work & pertinent test results  Airway Mallampati: III  TM Distance: <3 FB     Dental  (+) Chipped   Pulmonary sleep apnea , former smoker,    Pulmonary exam normal        Cardiovascular hypertension, Pt. on medications Normal cardiovascular exam     Neuro/Psych Anxiety    GI/Hepatic negative GI ROS, Neg liver ROS,   Endo/Other  negative endocrine ROS  Renal/GU Renal InsufficiencyRenal disease  negative genitourinary   Musculoskeletal  (+) Arthritis , Fibromyalgia -  Abdominal Normal abdominal exam  (+)   Peds negative pediatric ROS (+)  Hematology negative hematology ROS (+)   Anesthesia Other Findings   Reproductive/Obstetrics                             Anesthesia Physical Anesthesia Plan  ASA: III  Anesthesia Plan: Bier Block   Post-op Pain Management:    Induction: Intravenous  Airway Management Planned: Nasal Cannula  Additional Equipment:   Intra-op Plan:   Post-operative Plan:   Informed Consent: I have reviewed the patients History and Physical, chart, labs and discussed the procedure including the risks, benefits and alternatives for the proposed anesthesia with the patient or authorized representative who has indicated his/her understanding and acceptance.   Dental advisory given  Plan Discussed with: Surgeon and CRNA  Anesthesia Plan Comments:         Anesthesia Quick Evaluation

## 2015-10-08 NOTE — H&P (Signed)
Reviewed paper H+P, will be scanned into chart. No changes noted.  

## 2015-10-08 NOTE — Transfer of Care (Signed)
Immediate Anesthesia Transfer of Care Note  Patient: Cathy Mitchell  Procedure(s) Performed: Procedure(s): REMOVAL GANGLION OF WRIST (Right)  Patient Location: PACU  Anesthesia Type:General  Level of Consciousness: awake and alert   Airway & Oxygen Therapy: Patient Spontanous Breathing and Patient connected to face mask oxygen  Post-op Assessment: Report given to RN and Post -op Vital signs reviewed and stable  Post vital signs: Reviewed  Last Vitals:  Filed Vitals:   10/08/15 0614 10/08/15 0821  BP: 149/96 157/105  Pulse: 73 86  Temp: 36.4 C   Resp: 20 14    Last Pain: There were no vitals filed for this visit.       Complications: No apparent anesthesia complications

## 2015-10-08 NOTE — Progress Notes (Signed)
Heart rate low 50's at times  No new orders from dr Kayleen Memos

## 2015-10-08 NOTE — Discharge Instructions (Signed)
Ganglion Cyst A ganglion cyst is a noncancerous, fluid-filled lump that occurs near joints or tendons. The ganglion cyst grows out of a joint or the lining of a tendon. It most often develops in the hand or wrist, but it can also develop in the shoulder, elbow, hip, knee, ankle, or foot. The round or oval ganglion cyst can be the size of a pea or larger than a grape. Increased activity may enlarge the size of the cyst because more fluid starts to build up.  CAUSES It is not known what causes a ganglion cyst to grow. However, it may be related to:  Inflammation or irritation around the joint.  An injury.  Repetitive movements or overuse.  Arthritis. RISK FACTORS Risk factors include:  Being a woman.  Being age 57-50. SIGNS AND SYMPTOMS Symptoms may include:   A lump. This most often appears on the hand or wrist, but it can occur in other areas of the body.  Tingling.  Pain.  Numbness.  Muscle weakness.  Weak grip.  Less movement in a joint. DIAGNOSIS Ganglion cysts are most often diagnosed based on a physical exam. Your health care provider will feel the lump and may shine a light alongside it. If it is a ganglion cyst, a light often shines through it. Your health care provider may order an X-ray, ultrasound, or MRI to rule out other conditions. TREATMENT Ganglion cysts usually go away on their own without treatment. If pain or other symptoms are involved, treatment may be needed. Treatment is also needed if the ganglion cyst limits your movement or if it gets infected. Treatment may include:  Wearing a brace or splint on your wrist or finger.  Taking anti-inflammatory medicine.  Draining fluid from the lump with a needle (aspiration).  Injecting a steroid into the joint.  Surgery to remove the ganglion cyst. HOME CARE INSTRUCTIONS  Do not press on the ganglion cyst, poke it with a needle, or hit it.  Take medicines only as directed by your health care  provider.  Wear your brace or splint as directed by your health care provider.  Watch your ganglion cyst for any changes.  Keep all follow-up visits as directed by your health care provider. This is important. SEEK MEDICAL CARE IF:  Your ganglion cyst becomes larger or more painful.  You have increased redness, red streaks, or swelling.  You have pus coming from the lump.  You have weakness or numbness in the affected area.  You have a fever or chills.   This information is not intended to replace advice given to you by your health care provider. Make sure you discuss any questions you have with your health care provider.   Document Released: 03/13/2000 Document Revised: 04/06/2014 Document Reviewed: 08/29/2013 Elsevier Interactive Patient Education 2016 [place] Anesthesia, Adult, Care After Refer to this sheet in the next few weeks. These instructions provide you with information on caring for yourself after your procedure. Your health care provider may also give you more specific instructions. Your treatment has been planned according to current medical practices, but problems sometimes occur. Call your health care provider if you have any problems or questions after your procedure. WHAT TO EXPECT AFTER THE PROCEDURE After the procedure, it is typical to experience:  Sleepiness.  Nausea and vomiting. HOME CARE INSTRUCTIONS  For the first 24 hours after general anesthesia:  Have a responsible person with you.  Do not drive a car. If you are alone, do not  take public transportation.  Do not drink alcohol.  Do not take medicine that has not been prescribed by your health care provider.  Do not sign important papers or make important decisions.  You may resume a normal diet and activities as directed by your health care provider.  Change bandages (dressings) as directed.  If you have questions or problems that seem related to general anesthesia, call  the hospital and ask for the anesthetist or anesthesiologist on call. SEEK MEDICAL CARE IF:  You have nausea and vomiting that continue the day after anesthesia.  You develop a rash. SEEK IMMEDIATE MEDICAL CARE IF:   You have difficulty breathing.  You have chest pain.  You have any allergic problems.   This information is not intended to replace advice given to you by your health care provider. Make sure you discuss any questions you have with your health care provider.   Document Released: 06/22/2000 Document Revised: 04/06/2014 Document Reviewed: 07/15/2011 Elsevier Interactive Patient Education 2016 [place] and [place]

## 2015-10-08 NOTE — Anesthesia Procedure Notes (Signed)
Procedure Name: Intubation Performed by: Lacole Komorowski Pre-anesthesia Checklist: Patient identified, Patient being monitored, Timeout performed, Emergency Drugs available and Suction available Patient Re-evaluated:Patient Re-evaluated prior to inductionOxygen Delivery Method: Circle system utilized Preoxygenation: Pre-oxygenation with 100% oxygen Intubation Type: IV induction and Rapid sequence Laryngoscope Size: Miller and 2 Grade View: Grade I Tube type: Oral Tube size: 7.0 mm Number of attempts: 1 Airway Equipment and Method: Stylet Placement Confirmation: ETT inserted through vocal cords under direct vision,  positive ETCO2 and breath sounds checked- equal and bilateral Secured at: 21 cm Tube secured with: Tape Dental Injury: Teeth and Oropharynx as per pre-operative assessment        

## 2015-10-09 DIAGNOSIS — K912 Postsurgical malabsorption, not elsewhere classified: Secondary | ICD-10-CM | POA: Diagnosis not present

## 2015-10-09 DIAGNOSIS — Z9884 Bariatric surgery status: Secondary | ICD-10-CM | POA: Diagnosis not present

## 2015-10-09 NOTE — Anesthesia Postprocedure Evaluation (Signed)
Anesthesia Post Note  Patient: Cathy Mitchell  Procedure(s) Performed: Procedure(s) (LRB): REMOVAL GANGLION OF WRIST (Right)  Patient location during evaluation: PACU Anesthesia Type: General Level of consciousness: awake and alert and oriented Pain management: pain level controlled Vital Signs Assessment: post-procedure vital signs reviewed and stable Respiratory status: spontaneous breathing Cardiovascular status: blood pressure returned to baseline Anesthetic complications: no    Last Vitals:  Filed Vitals:   10/08/15 0930 10/08/15 0950  BP: 121/77 118/68  Pulse: 56 57  Temp:    Resp: 16 15    Last Pain:  Filed Vitals:   10/08/15 1007  PainSc: 0-No pain                 Cathy Mitchell

## 2015-10-11 DIAGNOSIS — Z9889 Other specified postprocedural states: Secondary | ICD-10-CM | POA: Diagnosis not present

## 2015-10-22 LAB — SURGICAL PATHOLOGY

## 2015-11-13 ENCOUNTER — Telehealth: Payer: Self-pay

## 2015-11-13 DIAGNOSIS — R0602 Shortness of breath: Secondary | ICD-10-CM | POA: Insufficient documentation

## 2015-11-13 DIAGNOSIS — J45909 Unspecified asthma, uncomplicated: Secondary | ICD-10-CM | POA: Insufficient documentation

## 2015-11-13 NOTE — Telephone Encounter (Signed)
Patient called and left me a voicemail stating that she needs a referral to go to a pulmonologist. I called patient back to get some more information. Patient states that she has been having a hard time breathing and that is has been going on for a while now. Patient states she has talked to [place] about this before. Patient states she does not want to come here because it would be a waste of time and money for her. Patient states she just wants to go see a pulmonologist to get confirmation because she believes she has cancer.

## 2015-11-13 NOTE — Telephone Encounter (Signed)
Called and let patient know referral was entered and she should hear from either our office or the office the referral was sent to. I asked for her to give [place] a call if she does not hear anything within a week or so.

## 2015-11-15 ENCOUNTER — Telehealth: Payer: Self-pay | Admitting: Unknown Physician Specialty

## 2015-11-15 DIAGNOSIS — Z91018 Allergy to other foods: Secondary | ICD-10-CM

## 2015-11-15 NOTE — Telephone Encounter (Signed)
Referral generated

## 2015-11-15 NOTE — Telephone Encounter (Signed)
Routing to provider  

## 2015-11-15 NOTE — Telephone Encounter (Signed)
Pt would like to have a referral to go see an allergist to find out what foods she may be allergic to because she has been having reactions when she eats certain foods but she can't pen point exactly what it is. Her face has broken out really bad and she sometimes feels like her throat is going to close.

## 2015-12-05 ENCOUNTER — Ambulatory Visit (INDEPENDENT_AMBULATORY_CARE_PROVIDER_SITE_OTHER): Payer: BLUE CROSS/BLUE SHIELD

## 2015-12-05 DIAGNOSIS — Z23 Encounter for immunization: Secondary | ICD-10-CM | POA: Diagnosis not present

## 2015-12-09 ENCOUNTER — Encounter: Payer: Self-pay | Admitting: Pulmonary Disease

## 2015-12-09 ENCOUNTER — Ambulatory Visit (INDEPENDENT_AMBULATORY_CARE_PROVIDER_SITE_OTHER): Payer: BLUE CROSS/BLUE SHIELD | Admitting: Pulmonary Disease

## 2015-12-09 ENCOUNTER — Ambulatory Visit: Payer: BLUE CROSS/BLUE SHIELD

## 2015-12-09 ENCOUNTER — Telehealth: Payer: Self-pay | Admitting: *Deleted

## 2015-12-09 ENCOUNTER — Other Ambulatory Visit
Admission: RE | Admit: 2015-12-09 | Discharge: 2015-12-09 | Disposition: A | Discharge status: Home / Self Care | Payer: BLUE CROSS/BLUE SHIELD | Source: Ambulatory Visit | Attending: Pulmonary Disease | Admitting: Pulmonary Disease

## 2015-12-09 VITALS — BP 130/82 | HR 87 | Ht 68.0 in | Wt 276.0 lb

## 2015-12-09 DIAGNOSIS — R079 Chest pain, unspecified: Secondary | ICD-10-CM

## 2015-12-09 DIAGNOSIS — Z87891 Personal history of nicotine dependence: Secondary | ICD-10-CM | POA: Diagnosis not present

## 2015-12-09 DIAGNOSIS — R0600 Dyspnea, unspecified: Secondary | ICD-10-CM

## 2015-12-09 LAB — BASIC METABOLIC PANEL
Anion gap: 6 (ref 5–15)
BUN: 19 mg/dL (ref 6–20)
CALCIUM: 9 mg/dL (ref 8.9–10.3)
CO2: 28 mmol/L (ref 22–32)
Chloride: 104 mmol/L (ref 101–111)
Creatinine, Ser: 0.61 mg/dL (ref 0.44–1.00)
GFR calc Af Amer: >60 mL/min (ref >60)
GLUCOSE: 91 mg/dL (ref 65–99)
POTASSIUM: 3.8 mmol/L (ref 3.5–5.1)
Sodium: 138 mmol/L (ref 135–145)

## 2015-12-09 LAB — HEPATIC FUNCTION PANEL
ALK PHOS: 80 U/L (ref 38–126)
ALT: 16 U/L (ref 14–54)
AST: 14 U/L — AB (ref 15–41)
Albumin: 4 g/dL (ref 3.5–5.0)
Total Bilirubin: <0.1 mg/dL — ABNORMAL LOW (ref 0.3–1.2)
Total Protein: 7.5 g/dL (ref 6.5–8.1)

## 2015-12-09 LAB — LIPASE, BLOOD: Lipase: 22 U/L (ref 11–51)

## 2015-12-09 LAB — AMYLASE: AMYLASE: 48 U/L (ref 28–100)

## 2015-12-09 NOTE — Telephone Encounter (Signed)
-----   Message from Cathy Mcardle, MD sent at 12/09/2015 12:54 PM EDT ----- Let her know all blood tests are normal. Specifically, her kidney function appears to be entirely normal and it is OK for her to use nonsteroidal anti-inflammatory medications such as Advil (ibuprofen) or Aleve (naproxen) as needed for pain  Thanks  Waunita Schooner

## 2015-12-09 NOTE — Telephone Encounter (Signed)
Pt informed. Nothing further needed. 

## 2015-12-09 NOTE — Progress Notes (Signed)
PULMONARY CONSULT NOTE  Requesting MD/Service: Kathrine Haddock, NP Date of initial consultation: 12/09/15 Reason for consultation: Thoracic pain, dyspnea  PT PROFILE: 27 F former smoker who underwent gastric bypass surgery in 02/2015 with 100# weight loss since that time referred for evaluation of intermittent severe posterior thoracic pain and intermittent dyspnea  HPI:  As above. She describes intermittent, sharp posterior thoracic pain (bilateral and at the lower portion of her rib cage) of several months duration occurring most days and sometimes lasting up to a couple of days at a time. The pain seems to be exacerbated by immobility and improved when she exercises. She cannot identify any other exacerbating or mitigating factors. It does not seem to be positional and is not exercise related or meal related. She avoids all analgesics including NSAIDS and APAP. She also describes dyspnea manifesting as a sense of inability to get a full breath. This too seems to improve with exercise. This symptom seems to coincide with the thoracic pain described above. She denies fever, purulent sputum, hemoptysis, LE edema and calf tenderness. Also denies abdominal pain, nausea and vomiting.   Past Medical History:  Diagnosis Date  . Anxiety   . Arthritis   . Fibromyalgia   . Hypertension   . Neuromuscular disorder ([place])   . Sleep apnea    does not use C-PAP    Past Surgical History:  Procedure Laterality Date  . DILATION AND CURETTAGE OF UTERUS    . GANGLION CYST EXCISION Bilateral   . GANGLION CYST EXCISION Right 10/08/2015   Procedure: REMOVAL GANGLION OF WRIST;  Surgeon: Hessie Knows, MD;  Location: [facility] ORS;  Service: Orthopedics;  Laterality: Right;  . GASTRIC BYPASS    . HAND SURGERY Bilateral Resection  . TUBAL LIGATION      MEDICATIONS: I have reviewed all medications and confirmed regimen as documented  Social History   Social History  . Marital status: Married    Spouse name:  N/A  . Number of children: N/A  . Years of education: N/A   Occupational History  . Not on file.   Social History Main Topics  . Smoking status: Former Smoker    Packs/day: 0.50    Years: 29.00    Types: Cigarettes    Quit date: 01/29/2015  . Smokeless tobacco: Never Used  . Alcohol use No  . Drug use: No  . Sexual activity: Yes   Other Topics Concern  . Not on file   Social History Narrative  . No narrative on file    Family History  Problem Relation Age of Onset  . COPD Father   . Thyroid disease Daughter   . Eczema Son   . Heart disease Maternal Grandfather   . Diabetes Paternal Grandfather   . Heart disease Paternal Grandfather     ROS: No fever, myalgias/arthralgias, unexplained weight loss or weight gain No new focal weakness or sensory deficits No otalgia, hearing loss, visual changes, nasal and sinus symptoms, mouth and throat problems No neck pain or adenopathy No abdominal pain, N/V/D, diarrhea, change in bowel pattern No dysuria, change in urinary pattern   Vitals:   12/09/15 0953  BP: 130/82  Pulse: 87  SpO2: 95%  Weight: 276 lb (125.2 kg)  Height: 5\' 8"  (1.727 m)     EXAM:  Gen: Obese, No overt respiratory distress HEENT: NCAT, sclera white, oropharynx normal Neck: Supple without LAN, thyromegaly, JVD Lungs: breath sounds: full, percussion: normal, No adventitious sounds Cardiovascular: RRR, no murmurs noted  Abdomen: Obese, soft, nontender, normal BS Ext: without clubbing, cyanosis, edema Neuro: CNs grossly intact, motor and sensory intact Skin: Limited exam, no lesions noted  DATA:   BMP Latest Ref Rng & Units 12/09/2015 07/31/2015 04/18/2015  Glucose 65 - 99 mg/dL 91 89 98  BUN 6 - 20 mg/dL 19 18 6   Creatinine 0.44 - 1.00 mg/dL 0.61 0.69 0.62  BUN/Creat Ratio 9 - 23 - 26(H) 10  Sodium 135 - 145 mmol/L 138 138 142  Potassium 3.5 - 5.1 mmol/L 3.8 4.3 4.2  Chloride 101 - 111 mmol/L 104 99 100  CO2 22 - 32 mmol/L 28 25 26   Calcium 8.9  - 10.3 mg/dL 9.0 9.4 9.2    CBC Latest Ref Rng & Units 07/31/2015 04/18/2015 12/03/2014  WBC 3.4 - 10.8 x10E3/uL 8.7 6.5 12.2(H)  Hemoglobin 12.0 - 16.0 g/dL - - 14.5  Hematocrit 34.0 - 46.6 % 40.0 41.9 43.1  Platelets 150 - 379 x10E3/uL 338 304 334    CXR (04/17/15):  NACPD CTA chest (04/19/15): Normal, no PE  IMPRESSION:     ICD-9-CM ICD-10-CM   1. Dyspnea 786.09 R06.00 DG Chest 2 View     Pulmonary function test  2. Chest pain, unspecified chest pain type 786.50 R07.9 Amylase     Lipase     Hepatic function panel     Basic Metabolic Panel (BMET)  3. Former smoker V15.82 Z87.891    Her symptoms do not suggest any particular diagnosis. The pain is likely musculoskeletal in origin and might be related to fibromyalgia syndrome. We need to rule out referred visceral pain. She is a former smoker but previous CT chest from 04/19/15 is entirely normal  PLAN:  1) BMET, liver panel, amylase, lipase today 2) PRN acetaminophen and or NSAID for pain 3) ROV 4 weeks with CXR and PFTs   Merton Border, MD PCCM service Mobile [phone] Pager [phone] 12/09/2015

## 2015-12-24 ENCOUNTER — Encounter: Payer: Self-pay | Admitting: Unknown Physician Specialty

## 2015-12-24 ENCOUNTER — Ambulatory Visit (INDEPENDENT_AMBULATORY_CARE_PROVIDER_SITE_OTHER): Payer: BLUE CROSS/BLUE SHIELD | Admitting: Unknown Physician Specialty

## 2015-12-24 VITALS — BP 142/83 | HR 61 | Ht 68.2 in | Wt 276.0 lb

## 2015-12-24 DIAGNOSIS — K130 Diseases of lips: Secondary | ICD-10-CM | POA: Diagnosis not present

## 2015-12-24 DIAGNOSIS — R220 Localized swelling, mass and lump, head: Secondary | ICD-10-CM

## 2015-12-24 MED ORDER — MAGIC MOUTHWASH W/LIDOCAINE: 5.0000 mL | Freq: Four times a day (QID) | ORAL | 0 refills | Status: DC | PRN

## 2015-12-24 MED ORDER — AMOXICILLIN-POT CLAVULANATE 875-125 MG PO TABS: 1.0000 | ORAL_TABLET | Freq: Two times a day (BID) | ORAL | 0 refills | Status: DC

## 2015-12-24 NOTE — Progress Notes (Signed)
BP (!) 142/83 (BP Location: Right Arm, Patient Position: Sitting, Cuff Size: Normal)   Pulse 61   Ht 5' 8.2" (1.732 m)   Wt 276 lb (125.2 kg)   SpO2 99%   BMI 41.72 kg/m    Subjective:    Patient ID: Cathy Mitchell, female    DOB: 1965/03/31, 50 y.o.   MRN: [id number]  HPI: Cathy Mitchell is a 50 y.o. female  Chief Complaint  Patient presents with  . Oral Swelling    Thursday night, possible bug bite, taking Benadryl, ice. Patient lanced on Friday and uses peroxide   Pt states she woke up last Thursday night with pain on her lip and might have felt something running across her lip.  The swelling is a little better.  It is very painful and unable to eat.  She said she "lanced" it with a "big needle."    Relevant past medical, surgical, family and social history reviewed and updated as indicated. Interim medical history since our last visit reviewed. Allergies and medications reviewed and updated.  Review of Systems  Per HPI unless specifically indicated above     Objective:    BP (!) 142/83 (BP Location: Right Arm, Patient Position: Sitting, Cuff Size: Normal)   Pulse 61   Ht 5' 8.2" (1.732 m)   Wt 276 lb (125.2 kg)   SpO2 99%   BMI 41.72 kg/m   Wt Readings from Last 3 Encounters:  12/24/15 276 lb (125.2 kg)  12/09/15 276 lb (125.2 kg)  10/07/15 280 lb (127 kg)    Physical Exam  Constitutional: She is oriented to person, place, and time. She appears well-developed and well-nourished. No distress.  HENT:  Head: Normocephalic and atraumatic.  Eyes: Conjunctivae and lids are normal. Right eye exhibits no discharge. Left eye exhibits no discharge. No scleral icterus.  Cardiovascular: Normal rate.   Pulmonary/Chest: Effort normal.  Abdominal: Normal appearance. There is no splenomegaly or hepatomegaly.  Musculoskeletal: Normal range of motion.  Neurological: She is alert and oriented to person, place, and time.  Skin: Skin is intact. No rash noted. No pallor.  Pt  with swelling left lower lip.  Noted mouth sores on the inside.    Psychiatric: She has a normal mood and affect. Her behavior is normal. Judgment and thought content normal.    Results for orders placed or performed during the hospital encounter of 12/09/15  Amylase  Result Value Ref Range   Amylase 48 28 - 100 U/L  Hepatic function panel  Result Value Ref Range   Total Protein 7.5 6.5 - 8.1 g/dL   Albumin 4.0 3.5 - 5.0 g/dL   AST 14 (L) 15 - 41 U/L   ALT 16 14 - 54 U/L   Alkaline Phosphatase 80 38 - 126 U/L   Total Bilirubin <0.1 (L) 0.3 - 1.2 mg/dL   Bilirubin, Direct <0.1 (L) 0.1 - 0.5 mg/dL   Indirect Bilirubin NOT CALCULATED 0.3 - 0.9 mg/dL  Basic Metabolic Panel (BMET)  Result Value Ref Range   Sodium 138 135 - 145 mmol/L   Potassium 3.8 3.5 - 5.1 mmol/L   Chloride 104 101 - 111 mmol/L   CO2 28 22 - 32 mmol/L   Glucose, Bld 91 65 - 99 mg/dL   BUN 19 6 - 20 mg/dL   Creatinine, Ser 0.61 0.44 - 1.00 mg/dL   Calcium 9.0 8.9 - 10.3 mg/dL   GFR calc non Af Amer >60 >60 mL/min  GFR calc Af Amer >60 >60 mL/min   Anion gap 6 5 - 15  Lipase, blood  Result Value Ref Range   Lipase 22 11 - 51 U/L      Assessment & Plan:   Problem List Items Addressed This Visit    None    Visit Diagnoses    Lip swelling    -  Primary   Bite vs. viral.  Will treat with antibiotics to r/o secondary infection.         Follow up plan: Return if symptoms worsen or fail to improve.

## 2016-01-15 ENCOUNTER — Ambulatory Visit (INDEPENDENT_AMBULATORY_CARE_PROVIDER_SITE_OTHER): Payer: BLUE CROSS/BLUE SHIELD | Admitting: *Deleted

## 2016-01-15 ENCOUNTER — Ambulatory Visit
Admission: RE | Admit: 2016-01-15 | Discharge: 2016-01-15 | Disposition: A | Discharge status: Home / Self Care | Payer: BLUE CROSS/BLUE SHIELD | Source: Ambulatory Visit | Attending: Pulmonary Disease | Admitting: Pulmonary Disease

## 2016-01-15 DIAGNOSIS — R0600 Dyspnea, unspecified: Secondary | ICD-10-CM

## 2016-01-15 LAB — PULMONARY FUNCTION TEST
DL/VA % PRED: 94 %
DL/VA: 4.96 ml/min/mmHg/L
DLCO UNC % PRED: 93 %
DLCO unc: 27.73 ml/min/mmHg
FEF 25-75 POST: 2.98 L/s
FEF 25-75 Pre: 2.48 L/sec
FEF2575-%CHANGE-POST: 20 %
FEF2575-%PRED-PRE: 81 %
FEF2575-%Pred-Post: 98 %
FEV1-%CHANGE-POST: 5 %
FEV1-%Pred-Post: 89 %
FEV1-%Pred-Pre: 85 %
FEV1-PRE: 2.76 L
FEV1-Post: 2.9 L
FEV1FVC-%CHANGE-POST: 4 %
FEV1FVC-%Pred-Pre: 98 %
FEV6-%Change-Post: 0 %
FEV6-%PRED-POST: 88 %
FEV6-%PRED-PRE: 88 %
FEV6-POST: 3.52 L
FEV6-PRE: 3.49 L
FEV6FVC-%Pred-Post: 103 %
FEV6FVC-%Pred-Pre: 103 %
FVC-%CHANGE-POST: 0 %
FVC-%Pred-Post: 86 %
FVC-%Pred-Pre: 85 %
FVC-PRE: 3.49 L
FVC-Post: 3.52 L
Post FEV1/FVC ratio: 82 %
Post FEV6/FVC ratio: 100 %
Pre FEV1/FVC ratio: 79 %
Pre FEV6/FVC Ratio: 100 %
RV % pred: 130 %
RV: 2.55 L
TLC % pred: 114 %
TLC: 6.45 L

## 2016-01-15 NOTE — Progress Notes (Signed)
PFT performed today. 

## 2016-01-17 ENCOUNTER — Telehealth: Payer: Self-pay | Admitting: Unknown Physician Specialty

## 2016-01-17 ENCOUNTER — Encounter: Payer: Self-pay | Admitting: Pulmonary Disease

## 2016-01-17 ENCOUNTER — Ambulatory Visit (INDEPENDENT_AMBULATORY_CARE_PROVIDER_SITE_OTHER): Payer: BLUE CROSS/BLUE SHIELD | Admitting: Pulmonary Disease

## 2016-01-17 VITALS — BP 128/68 | HR 99 | Ht 68.0 in | Wt 275.0 lb

## 2016-01-17 DIAGNOSIS — G8929 Other chronic pain: Secondary | ICD-10-CM

## 2016-01-17 DIAGNOSIS — R0600 Dyspnea, unspecified: Secondary | ICD-10-CM

## 2016-01-17 DIAGNOSIS — R0609 Other forms of dyspnea: Secondary | ICD-10-CM

## 2016-01-17 DIAGNOSIS — M546 Pain in thoracic spine: Secondary | ICD-10-CM

## 2016-01-17 DIAGNOSIS — L509 Urticaria, unspecified: Secondary | ICD-10-CM

## 2016-01-17 NOTE — Progress Notes (Signed)
PULMONARY OFFICE FOLLOW UP NOTE  Requesting MD/Service: Kathrine Haddock, NP Date of initial consultation: 12/09/15 Reason for consultation: Thoracic pain, dyspnea  PT PROFILE: 59 F former smoker who underwent gastric bypass surgery in 02/2015 with 100# weight loss since that time referred for evaluation of intermittent severe posterior thoracic pain and intermittent dyspnea  CTA chest (04/19/15): Normal, no PE CXR (01/15/16):  Entirely normal PFTs 01/15/16: No obstruction but residual volume mildly increased, no response to bronchodilator, lung volumes and DLCO normal  SUBJ:  Posterior thoracic pain responded very well to PRN acetaminophen. She continues to have it intermittently and continues to take acetaminophen occasionally. The dyspnea is "not a problem". No new complaints.  OBJ:  Vitals:   01/17/16 0835  BP: 128/68  Pulse: 99  SpO2: 97%  Weight: 275 lb (124.7 kg)  Height: 5\' 8"  (1.727 m)    EXAM:  Gen: Obese, No overt respiratory distress HEENT: normal Lungs: breath sounds full, no adventitious sounds Cardiovascular: Reg, no murmurs Abdomen: Obese, soft, nontender, normal BS Ext: without clubbing, cyanosis, edema Neuro: grossly intact   DATA:  CXR (01/15/16):  Entirely normal PFTs 01/15/16: No obstruction but RV mildly increased, no response to bronchodilator, lung volumes and DLCO normal   IMPRESSION:     ICD-9-CM ICD-10-CM   1. Chronic bilateral thoracic back pain 724.1 M54.6    338.29 G89.29   2. DOE (dyspnea on exertion) 786.09 R06.09    Back pain of unclear etiology but seems to be responsive to APAP. Dyspnea is of unclear etiology but appears to be minimal   PLAN:  1) Congratulated on excellent efforts at weight loss and exercise 2) Cont PRN acetaminophen and or NSAID for pain 3) ROV PRN   Merton Border, MD PCCM service Mobile [phone] Pager [phone] 01/17/2016

## 2016-01-17 NOTE — Patient Instructions (Signed)
Continue Acetaminophen as needed for pain  Continue your excellent efforts at weight loss and exercise  Follow up as needed

## 2016-01-17 NOTE — Telephone Encounter (Signed)
Malachy Mood, I see a referral to allergy, but she also wants a derm referral.  Alwyn Ren, can you check into her allergy referral? She states she never heard anything.

## 2016-01-17 NOTE — Telephone Encounter (Signed)
Pt would like a referral to go to dermatology and a allergist. She stated she had got this referral a while ago but hasn't heard from them.

## 2016-01-21 NOTE — Telephone Encounter (Signed)
Cathy Mitchell, Marye Round is checking on this for me.

## 2016-01-22 DIAGNOSIS — L700 Acne vulgaris: Secondary | ICD-10-CM | POA: Diagnosis not present

## 2016-01-22 NOTE — Telephone Encounter (Signed)
Called [facility] late yesterday afternoon and took notes to type phone call this morning. [facility] was where the patient's allergy referral was sent to so I called to ask about it and they said that they were no longer taking new patient's because the allergist was retiring. A referral for the actual pulmonary division was also sent in to [facility] 2 days prior to the allergy referral. Patient needs to be sent to another allergist if possible.

## 2016-01-23 NOTE — Telephone Encounter (Signed)
Patient referred to [facility].  Pending office notification of appt/date and time.

## 2016-02-04 DIAGNOSIS — J305 Allergic rhinitis due to food: Secondary | ICD-10-CM | POA: Diagnosis not present

## 2016-02-04 DIAGNOSIS — T50905A Adverse effect of unspecified drugs, medicaments and biological substances, initial encounter: Secondary | ICD-10-CM | POA: Diagnosis not present

## 2016-02-04 DIAGNOSIS — T781XXA Other adverse food reactions, not elsewhere classified, initial encounter: Secondary | ICD-10-CM | POA: Diagnosis not present

## 2016-02-12 ENCOUNTER — Ambulatory Visit (INDEPENDENT_AMBULATORY_CARE_PROVIDER_SITE_OTHER): Payer: BLUE CROSS/BLUE SHIELD | Admitting: Unknown Physician Specialty

## 2016-02-12 ENCOUNTER — Encounter: Payer: Self-pay | Admitting: Unknown Physician Specialty

## 2016-02-12 DIAGNOSIS — M25551 Pain in right hip: Secondary | ICD-10-CM

## 2016-02-12 DIAGNOSIS — Z91018 Allergy to other foods: Secondary | ICD-10-CM

## 2016-02-12 DIAGNOSIS — R52 Pain, unspecified: Secondary | ICD-10-CM | POA: Diagnosis not present

## 2016-02-12 DIAGNOSIS — G8929 Other chronic pain: Secondary | ICD-10-CM | POA: Insufficient documentation

## 2016-02-12 NOTE — Assessment & Plan Note (Signed)
Suspect gluteus minimus or maximus tendonitis.  Refer to PT

## 2016-02-12 NOTE — Assessment & Plan Note (Signed)
Discussed with pt that for her next surgery, we know she can have Vicoden

## 2016-02-12 NOTE — Progress Notes (Signed)
   BP 120/84 (BP Location: Right Arm, Patient Position: Sitting, Cuff Size: Large)   Pulse 85   Temp 98 F (36.7 C)   Wt 275 lb 9.6 oz (125 kg)   SpO2 99%   BMI 41.90 kg/m    Subjective:    Patient ID: Cathy Mitchell, female    DOB: 1965/11/14, 50 y.o.   MRN: [id number]  HPI: Cathy Mitchell is a 50 y.o. female  Chief Complaint  Patient presents with  . Hip Pain    right hip, pt states it has been bothering her for about a year   . other    pt states she went to an allergist and they told her to see her primary for pain?   ENT visit She had allergy testing done at ENT.  She has some questions about this.  She has worked with a nutritionist before and is willing to go back again.    Pain management Doesn't deal well with many narcotics.  She can take Vicoden.    Hip pain Can't sleep on stomach due to right hip pain.  She is having right lateral pain.  She admits that she pushes through pain while she is in the gym.   Relevant past medical, surgical, family and social history reviewed and updated as indicated. Interim medical history since our last visit reviewed. Allergies and medications reviewed and updated.  Review of Systems  Per HPI unless specifically indicated above     Objective:    BP 120/84 (BP Location: Right Arm, Patient Position: Sitting, Cuff Size: Large)   Pulse 85   Temp 98 F (36.7 C)   Wt 275 lb 9.6 oz (125 kg)   SpO2 99%   BMI 41.90 kg/m   Wt Readings from Last 3 Encounters:  02/12/16 275 lb 9.6 oz (125 kg)  01/17/16 275 lb (124.7 kg)  12/24/15 276 lb (125.2 kg)    Physical Exam  Constitutional: She is oriented to person, place, and time. She appears well-developed and well-nourished. No distress.  HENT:  Head: Normocephalic and atraumatic.  Eyes: Conjunctivae and lids are normal. Right eye exhibits no discharge. Left eye exhibits no discharge. No scleral icterus.  Neck: Normal range of motion. Neck supple. No JVD present. Carotid bruit is  not present.  Cardiovascular: Normal rate, regular rhythm and normal heart sounds.   Pulmonary/Chest: Effort normal and breath sounds normal.  Abdominal: Normal appearance. There is no splenomegaly or hepatomegaly.  Musculoskeletal: Normal range of motion.  Neurological: She is alert and oriented to person, place, and time.  Skin: Skin is warm, dry and intact. No rash noted. No pallor.  Psychiatric: She has a normal mood and affect. Her behavior is normal. Judgment and thought content normal.     Assessment & Plan:   Problem List Items Addressed This Visit      Unprioritized   Hip pain, right    Suspect gluteus minimus or maximus tendonitis.  Refer to PT      Relevant Orders   Ambulatory referral to Physical Therapy   Pain management    Discussed with pt that for her next surgery, we know she can have Vicoden      Wheat allergy    Non-celiac.  Refer to a nutritionist          Follow up plan: Return if symptoms worsen or fail to improve, for for physical.

## 2016-02-12 NOTE — Assessment & Plan Note (Signed)
Non-celiac.  Refer to a nutritionist

## 2016-02-27 DIAGNOSIS — Z713 Dietary counseling and surveillance: Secondary | ICD-10-CM | POA: Diagnosis not present

## 2016-03-09 ENCOUNTER — Ambulatory Visit: Payer: BLUE CROSS/BLUE SHIELD | Attending: Unknown Physician Specialty | Admitting: Physical Therapy

## 2016-03-09 DIAGNOSIS — M25551 Pain in right hip: Secondary | ICD-10-CM | POA: Diagnosis not present

## 2016-03-09 DIAGNOSIS — R262 Difficulty in walking, not elsewhere classified: Secondary | ICD-10-CM | POA: Diagnosis not present

## 2016-03-09 DIAGNOSIS — M25651 Stiffness of right hip, not elsewhere classified: Secondary | ICD-10-CM | POA: Diagnosis not present

## 2016-03-09 DIAGNOSIS — M6281 Muscle weakness (generalized): Secondary | ICD-10-CM | POA: Diagnosis not present

## 2016-03-09 NOTE — Therapy (Signed)
[facility] [address] [place], [address] Phone: [phone]   Fax:  [phone]  Physical Therapy Evaluation  Patient Details  Name: Cathy Mitchell MRN: [id number] Date of Birth: 1965-10-03 Referring Provider: Julian Hy  Encounter Date: 03/09/2016      PT End of Session - 03/09/16 0905    Visit Number 1   Number of Visits 13   Date for PT Re-Evaluation 04/20/16   PT Start Time 0800   PT Stop Time 0850   PT Time Calculation (min) 50 min   Activity Tolerance Patient tolerated treatment well      Past Medical History:  Diagnosis Date  . Anxiety   . Arthritis   . Fibromyalgia   . Hypertension   . Neuromuscular disorder ([place])   . Sleep apnea    does not use C-PAP    Past Surgical History:  Procedure Laterality Date  . DILATION AND CURETTAGE OF UTERUS    . GANGLION CYST EXCISION Bilateral   . GANGLION CYST EXCISION Right 10/08/2015   Procedure: REMOVAL GANGLION OF WRIST;  Surgeon: Hessie Knows, MD;  Location: [facility] ORS;  Service: Orthopedics;  Laterality: Right;  . GASTRIC BYPASS    . HAND SURGERY Bilateral Resection  . TUBAL LIGATION      There were no vitals filed for this visit.       Subjective Assessment - 03/09/16 0807    Subjective R hip pain   Pertinent History Pt has complex medical history including fibromyalgia, RA, multiple surgeries, "I was born crippled". Reports that her R hip is either fractured or something is torn. This is due to significant pain in hip. She is unable to sleep due to significant amount of pain she is in. Pain has been present for years, has been intense for the past 5 months. On same side pt has a self reported R sided meniscal tear. Pt is unable to sleep more than 2-3 hrs due to pain.   How long can you sit comfortably? immediate pain   How long can you stand comfortably? immediate pain   How long can you walk comfortably? immediate pain   Diagnostic tests  negative x-ray, per pt these are always false negatives   Patient Stated Goals decr. pain, rehab knee, return to cardio, return to sleep.   Currently in Pain? Yes   Pain Score 10-Worst pain ever   Pain Location Hip   Pain Orientation Right   Pain Descriptors / Indicators Burning;Stabbing   Pain Type Acute pain;Chronic pain            [facility] PT Assessment - 03/09/16 0001      Assessment   Medical Diagnosis R hip pain   Referring Provider [place]   Hand Dominance Right   Prior Therapy none for this     Precautions   Precautions None     Restrictions   Weight Bearing Restrictions No     Balance Screen   Has the patient fallen in the past 6 months Yes   How many times? 1   Has the patient had a decrease in activity level because of a fear of falling?  No   Is the patient reluctant to leave their home because of a fear of falling?  No     Home Environment   Living Environment Private residence   Available Help at Discharge Family     Prior Function   Level of Independence Independent  Vocation Works at home   [employer] taking care of grandchildren     Observation/Other Assessments   Observations B knee valgus, overpronation in B feet     Functional Tests   Functional tests Squat;Single leg stance     Squat   Comments Pt unable to perform more than 20 deg. due to pain     Single Leg Stance   Comments Unable to perform on R.     Posture/Postural Control   Posture Comments FHP     ROM / Strength   AROM / PROM / Strength AROM;Strength     AROM   Overall AROM Comments decr. AROM/PROM on R hip, particularly with hip ER limited by 25 deg. with reproduction of pain with assessment.     Strength   Overall Strength Within functional limits for tasks performed   Overall Strength Comments 1/2 step weaker on R side     Palpation   Spinal mobility deferred   SI assessment  pain and tenderness to palpation, pain with gaenslens, compression an ddistraction    Palpation comment tenderness to palpation along piriformis, distal adductor, IT band, TFL, worst at TFL, no specific tenderness to palpation at greater trochanter     Ambulation/Gait   Gait Comments decr. stance time on R, pt "drops" out of posture with gait on R side, generally excessive thoracic rotation, latearl wt shift indicating poor hip/glute activation.             Objective: Gentle to moderate STM performed on R vastus lateralis, adductors, TFL, piriformis. Utilized petrissage and light compression for this.  Pt reported this "felt good" and pain level decr. From a 10 to an 8 following performance of this. PT encouraged pt to perform x5 min walking daily between now and next session.              PT Education - 03/09/16 0904    Education provided Yes   Education Details course of PT   Person(s) Educated Patient   Methods Explanation   Comprehension Verbalized understanding             PT Long Term Goals - 03/09/16 0908      PT LONG TERM GOAL #1   Title Pt will be able to walk 5 min confidently with minimal pain to improve functional tolerance   Baseline pain immediately with gait   Time 6   Period Weeks   Status New     PT LONG TERM GOAL #2   Title Pt will be I with HEP to manage symptoms of hip pain as seen by improvement in hip strength to R=L   Baseline 4+/5 on R vs 5 on L   Time 6   Period Weeks   Status New     PT LONG TERM GOAL #3   Title Pt will be able to lift objects from the ground with partial squat/lean   Baseline unable to squat   Time 6   Period Weeks   Status New               Plan - 03/09/16 0905    Clinical Impression Statement Pt is a pleasant 50 y/o female with c/o chronic hip pain which worsened over the past 5 months with no specific MOI. Pt presents with muscle weakness, altered gait related likely to meniscal tear she reports on R side, tenderness to palpation, and stiffness in R hip. pt has global excess  mobility and concommitant weakness  and would benefit from PT to address specific chronic pain factors as well as to be issued a general program to follow at the gym to continue to [place]. pain and improve function over time.   Rehab Potential Fair   Clinical Impairments Affecting Rehab Potential medical comorbidities/motivation   PT Frequency 2x / week   PT Duration 6 weeks   PT Treatment/Interventions ADLs/Self Care Home Management;Aquatic Therapy;Gait training;Therapeutic exercise;Balance training;Neuromuscular re-education;Patient/family education;Manual techniques;Dry needling   PT Next Visit Plan review pt exercises      Patient will benefit from skilled therapeutic intervention in order to improve the following deficits and impairments:  Hypomobility, Decreased strength, Postural dysfunction, Pain, Improper body mechanics, Abnormal gait, Difficulty walking, Impaired flexibility, Decreased endurance, Decreased mobility  Visit Diagnosis: Pain in right hip - Plan: PT plan of care cert/re-cert  Muscle weakness (generalized) - Plan: PT plan of care cert/re-cert  Difficulty in walking, not elsewhere classified - Plan: PT plan of care cert/re-cert  Stiffness of right hip, not elsewhere classified - Plan: PT plan of care cert/re-cert     Problem List Patient Active Problem List   Diagnosis Date Noted  . Wheat allergy 02/12/2016  . Hip pain, right 02/12/2016  . Pain management 02/12/2016  . SOB (shortness of breath) 11/13/2015  . Rosacea 04/29/2015  . Numbness 12/03/2014  . Chronic kidney disease, stage 1, normal or increased GFR 10/15/2014  . Sleep apnea 09/11/2014  . Extreme obesity ([place]) 09/11/2014  . Primary generalized hypertrophic osteoarthrosis 09/10/2014  . Obstructive sleep apnea 09/10/2014  . Chronic pain syndrome 09/10/2014  . Irritable bowel syndrome 09/10/2014  . Tobacco abuse 09/10/2014  . Acute anxiety 09/10/2014  . Fibromyalgia 09/10/2014    Lema Heinkel PT  DPT 03/09/2016, 9:12 AM  [facility] [address] [place], [address] Phone: [phone]   Fax:  [phone]  Name: IAN FLYTE MRN: [id number] Date of Birth: 1965/04/22

## 2016-03-11 ENCOUNTER — Encounter: Payer: BLUE CROSS/BLUE SHIELD | Admitting: Physical Therapy

## 2016-03-11 DIAGNOSIS — R638 Other symptoms and signs concerning food and fluid intake: Secondary | ICD-10-CM | POA: Diagnosis not present

## 2016-03-11 DIAGNOSIS — K912 Postsurgical malabsorption, not elsewhere classified: Secondary | ICD-10-CM | POA: Diagnosis not present

## 2016-03-11 DIAGNOSIS — Z9884 Bariatric surgery status: Secondary | ICD-10-CM | POA: Diagnosis not present

## 2016-03-11 DIAGNOSIS — Z5181 Encounter for therapeutic drug level monitoring: Secondary | ICD-10-CM | POA: Diagnosis not present

## 2016-03-12 ENCOUNTER — Ambulatory Visit: Payer: BLUE CROSS/BLUE SHIELD | Admitting: Physical Therapy

## 2016-03-12 DIAGNOSIS — M6281 Muscle weakness (generalized): Secondary | ICD-10-CM | POA: Diagnosis not present

## 2016-03-12 DIAGNOSIS — M25551 Pain in right hip: Secondary | ICD-10-CM

## 2016-03-12 DIAGNOSIS — M25651 Stiffness of right hip, not elsewhere classified: Secondary | ICD-10-CM | POA: Diagnosis not present

## 2016-03-12 DIAGNOSIS — R262 Difficulty in walking, not elsewhere classified: Secondary | ICD-10-CM | POA: Diagnosis not present

## 2016-03-12 NOTE — Therapy (Signed)
[facility] [address], [place], [address] Phone: [phone]   Fax:  [phone]  Physical Therapy Treatment  Patient Details  Name: Cathy Mitchell MRN: [id number] Date of Birth: [birth year] Referring Provider: Julian Hy  Encounter Date: 03/12/2016      PT End of Session - 03/12/16 0809    Visit Number 2   Number of Visits 13   Date for PT Re-Evaluation 04/20/16   PT Start Time 0725   PT Stop Time 0809   PT Time Calculation (min) 44 min   Activity Tolerance Patient tolerated treatment well      Past Medical History:  Diagnosis Date  . Anxiety   . Arthritis   . Fibromyalgia   . Hypertension   . Neuromuscular disorder ([place])   . Sleep apnea    does not use C-PAP    Past Surgical History:  Procedure Laterality Date  . DILATION AND CURETTAGE OF UTERUS    . GANGLION CYST EXCISION Bilateral   . GANGLION CYST EXCISION Right 10/08/2015   Procedure: REMOVAL GANGLION OF WRIST;  Surgeon: Hessie Knows, MD;  Location: [facility] ORS;  Service: Orthopedics;  Laterality: Right;  . GASTRIC BYPASS    . HAND SURGERY Bilateral Resection  . TUBAL LIGATION      There were no vitals filed for this visit.      Subjective Assessment - 03/12/16 0724    Subjective (P)  Pt reports improved pain of 1-2/10 in R hip. Reports improved sleep following previous session.   Pertinent History (P)  Pt has complex medical history including fibromyalgia, RA, multiple surgeries, "I was born crippled". Reports that her R hip is either fractured or something is torn. This is due to significant pain in hip. She is unable to sleep due to significant amount of pain she is in. Pain has been present for years, has been intense for the past 5 months. On same side pt has a self reported R sided meniscal tear. Pt is unable to sleep more than 2-3 hrs due to pain.   How long can you sit comfortably? (P)  immediate pain   How long can you stand  comfortably? (P)  immediate pain   How long can you walk comfortably? (P)  immediate pain   Diagnostic tests (P)  negative x-ray, per pt these are always false negatives   Patient Stated Goals (P)  decr. pain, rehab knee, return to cardio, return to sleep.   Currently in Pain? (P)  Yes   Pain Score (P)  2    Pain Location (P)  Hip                 Objective: SL STM performed on R vastus lateralis, IT band region, glutes, piriformis, R paraspinals, R QL.  25 min total with periodic opportunities for pt to stand up and stretch. Following this pt reported feeling "better than I have in a long time" althogh continues to have pain in R low back.  Answered pt questions regarding HEP.  Attempted squat, partial squat and wall sit, all painful on L anterior knee.   Performed TG at seat setting 22 3x10 very slow squats. Following this pt reported no difficulty or incr. Pain so encouraged pt to do so at home on her home TG.                      PT Long Term Goals - 03/09/16  0908      PT LONG TERM GOAL #1   Title Pt will be able to walk 5 min confidently with minimal pain to improve functional tolerance   Baseline pain immediately with gait   Time 6   Period Weeks   Status New     PT LONG TERM GOAL #2   Title Pt will be I with HEP to manage symptoms of hip pain as seen by improvement in hip strength to R=L   Baseline 4+/5 on R vs 5 on L   Time 6   Period Weeks   Status New     PT LONG TERM GOAL #3   Title Pt will be able to lift objects from the ground with partial squat/lean   Baseline unable to squat   Time 6   Period Weeks   Status New               Plan - 03/12/16 0809    Clinical Impression Statement pt reported decr. pain following STM, unable to perform pain free squat, partial squat, partial wall sit, so regressed to TG at seat setting 22 for sit<>stand motion activity.   Rehab Potential Fair   Clinical Impairments Affecting Rehab  Potential medical comorbidities/motivation   PT Frequency 2x / week   PT Duration 6 weeks   PT Treatment/Interventions ADLs/Self Care Home Management;Aquatic Therapy;Gait training;Therapeutic exercise;Balance training;Neuromuscular re-education;Patient/family education;Manual techniques;Dry needling   PT Next Visit Plan review pt exercises      Patient will benefit from skilled therapeutic intervention in order to improve the following deficits and impairments:  Hypomobility, Decreased strength, Postural dysfunction, Pain, Improper body mechanics, Abnormal gait, Difficulty walking, Impaired flexibility, Decreased endurance, Decreased mobility  Visit Diagnosis: Pain in right hip  Muscle weakness (generalized)     Problem List Patient Active Problem List   Diagnosis Date Noted  . Wheat allergy 02/12/2016  . Hip pain, right 02/12/2016  . Pain management 02/12/2016  . SOB (shortness of breath) 11/13/2015  . Rosacea 04/29/2015  . Numbness 12/03/2014  . Chronic kidney disease, stage 1, normal or increased GFR 10/15/2014  . Sleep apnea 09/11/2014  . Extreme obesity ([place]) 09/11/2014  . Primary generalized hypertrophic osteoarthrosis 09/10/2014  . Obstructive sleep apnea 09/10/2014  . Chronic pain syndrome 09/10/2014  . Irritable bowel syndrome 09/10/2014  . Tobacco abuse 09/10/2014  . Acute anxiety 09/10/2014  . Fibromyalgia 09/10/2014    Dave Mergen PT DPT 03/12/2016, 8:10 AM  [facility] [address], [place], [address] Phone: [phone]   Fax:  [phone]  Name: Cathy Mitchell MRN: [id number] Date of Birth: May 03, 1965

## 2016-03-17 ENCOUNTER — Ambulatory Visit: Payer: BLUE CROSS/BLUE SHIELD | Admitting: Physical Therapy

## 2016-03-17 DIAGNOSIS — M25551 Pain in right hip: Secondary | ICD-10-CM | POA: Diagnosis not present

## 2016-03-17 DIAGNOSIS — M6281 Muscle weakness (generalized): Secondary | ICD-10-CM | POA: Diagnosis not present

## 2016-03-17 DIAGNOSIS — R262 Difficulty in walking, not elsewhere classified: Secondary | ICD-10-CM

## 2016-03-17 DIAGNOSIS — M25651 Stiffness of right hip, not elsewhere classified: Secondary | ICD-10-CM | POA: Diagnosis not present

## 2016-03-17 NOTE — Therapy (Signed)
[facility] [address], [place], [address] Phone: [phone]   Fax:  [phone]  Physical Therapy Treatment  Patient Details  Name: Cathy Mitchell MRN: [id number] Date of Birth: 1965/12/30 Referring Provider: Julian Hy  Encounter Date: 03/17/2016      PT End of Session - 03/17/16 0739    Visit Number 3   Number of Visits 13   Date for PT Re-Evaluation 04/20/16   PT Start Time 0729   PT Stop Time 0810   PT Time Calculation (min) 41 min   Activity Tolerance Patient tolerated treatment well      Past Medical History:  Diagnosis Date  . Anxiety   . Arthritis   . Fibromyalgia   . Hypertension   . Neuromuscular disorder ([place])   . Sleep apnea    does not use C-PAP    Past Surgical History:  Procedure Laterality Date  . DILATION AND CURETTAGE OF UTERUS    . GANGLION CYST EXCISION Bilateral   . GANGLION CYST EXCISION Right 10/08/2015   Procedure: REMOVAL GANGLION OF WRIST;  Surgeon: Hessie Knows, MD;  Location: [facility];  Service: Orthopedics;  Laterality: Right;  . GASTRIC BYPASS    . HAND SURGERY Bilateral Resection  . TUBAL LIGATION      There were no vitals filed for this visit.      Subjective Assessment - 03/17/16 0732    Subjective Pt reports she had a "good" weekend". She reports she had to get up and down from the ground which was very difficult and painful for her. She reports that "with the fibromyalgia I have no choice" to do things that are painful.Marland Kitchen Has been consistent with HEP.   Pertinent History Pt has complex medical history including fibromyalgia, RA, multiple surgeries, "I was born crippled". Reports that her R hip is either fractured or something is torn. This is due to significant pain in hip. She is unable to sleep due to significant amount of pain she is in. Pain has been present for years, has been intense for the past 5 months. On same side pt has a self reported R sided  meniscal tear. Pt is unable to sleep more than 2-3 hrs due to pain.   How long can you sit comfortably? immediate pain   How long can you stand comfortably? immediate pain   How long can you walk comfortably? immediate pain   Diagnostic tests negative x-ray, per pt these are always false negatives   Patient Stated Goals decr. pain, rehab knee, return to cardio, return to sleep.   Currently in Pain? Yes   Pain Score 5    Pain Location Hip   Pain Orientation Right               Objective: 5 min TM walk to mimic gym activity, noted significant L pronation.  Amb in clinic x100' for assessment of gait on even ground, similar L pronation, B trendelenberg.  Performed mindful standing exercise to improve standing symmetry/improve joint centration.  Pt had signfificant difficulty feeling when muscles were turned on or off. Performed heel raises to fatigue to allow pt to feel calf activation, 18 on R, 8 on L, very poor form.  Issued this as HEP for pt.  Standing [place] exercise with cuing for improving glute activation, issued this as HEP as well.  Pt pleased with session, verbalized understanding of all exercises.  PT Education - 03/17/16 0739    Education provided Yes   Education Details HEP   Person(s) Educated Patient   Methods Explanation   Comprehension Verbalized understanding             PT Long Term Goals - 03/09/16 0908      PT LONG TERM GOAL #1   Title Pt will be able to walk 5 min confidently with minimal pain to improve functional tolerance   Baseline pain immediately with gait   Time 6   Period Weeks   Status New     PT LONG TERM GOAL #2   Title Pt will be I with HEP to manage symptoms of hip pain as seen by improvement in hip strength to R=L   Baseline 4+/5 on R vs 5 on L   Time 6   Period Weeks   Status New     PT LONG TERM GOAL #3   Title Pt will be able to lift objects from the ground with partial squat/lean    Baseline unable to squat   Time 6   Period Weeks   Status New               Plan - 03/17/16 0808    Clinical Impression Statement Pt is continuing to make improvement, demonstrates exremely poor LE mechanics including excess pronation, knee valgus and hip adduction. Would benefit fromcontinued PT to address these issues.   Rehab Potential Fair   Clinical Impairments Affecting Rehab Potential medical comorbidities/motivation   PT Frequency 2x / week   PT Duration 6 weeks   PT Treatment/Interventions ADLs/Self Care Home Management;Aquatic Therapy;Gait training;Therapeutic exercise;Balance training;Neuromuscular re-education;Patient/family education;Manual techniques;Dry needling   PT Next Visit Plan review pt exercises      Patient will benefit from skilled therapeutic intervention in order to improve the following deficits and impairments:  Hypomobility, Decreased strength, Postural dysfunction, Pain, Improper body mechanics, Abnormal gait, Difficulty walking, Impaired flexibility, Decreased endurance, Decreased mobility  Visit Diagnosis: Pain in right hip  Muscle weakness (generalized)  Difficulty in walking, not elsewhere classified     Problem List Patient Active Problem List   Diagnosis Date Noted  . Wheat allergy 02/12/2016  . Hip pain, right 02/12/2016  . Pain management 02/12/2016  . SOB (shortness of breath) 11/13/2015  . Rosacea 04/29/2015  . Numbness 12/03/2014  . Chronic kidney disease, stage 1, normal or increased GFR 10/15/2014  . Sleep apnea 09/11/2014  . Extreme obesity ([place]) 09/11/2014  . Primary generalized hypertrophic osteoarthrosis 09/10/2014  . Obstructive sleep apnea 09/10/2014  . Chronic pain syndrome 09/10/2014  . Irritable bowel syndrome 09/10/2014  . Tobacco abuse 09/10/2014  . Acute anxiety 09/10/2014  . Fibromyalgia 09/10/2014    Fisher,Benjamin PT DPT 03/17/2016, 8:09 AM  [facility] [address] [place], [address] Phone: [phone]   Fax:  [phone]  Name: Cathy Mitchell MRN: [id number] Date of Birth: 10/31/1965

## 2016-03-19 ENCOUNTER — Ambulatory Visit: Payer: BLUE CROSS/BLUE SHIELD | Admitting: Physical Therapy

## 2016-03-19 DIAGNOSIS — R262 Difficulty in walking, not elsewhere classified: Secondary | ICD-10-CM | POA: Diagnosis not present

## 2016-03-19 DIAGNOSIS — M25651 Stiffness of right hip, not elsewhere classified: Secondary | ICD-10-CM | POA: Diagnosis not present

## 2016-03-19 DIAGNOSIS — M6281 Muscle weakness (generalized): Secondary | ICD-10-CM

## 2016-03-19 DIAGNOSIS — M25551 Pain in right hip: Secondary | ICD-10-CM

## 2016-03-19 NOTE — Therapy (Signed)
[facility] [address] [place], [address] Phone: [phone]   Fax:  [phone]  Physical Therapy Treatment  Patient Details  Name: Cathy Mitchell MRN: [id number] Date of Birth: 1965-06-08 Referring Provider: Julian Hy  Encounter Date: 03/19/2016      PT End of Session - 03/19/16 0756    Visit Number 4   Number of Visits 13   Date for PT Re-Evaluation 04/20/16   PT Start Time 0720   PT Stop Time 0758   PT Time Calculation (min) 38 min   Activity Tolerance Patient tolerated treatment well      Past Medical History:  Diagnosis Date  . Anxiety   . Arthritis   . Fibromyalgia   . Hypertension   . Neuromuscular disorder ([place])   . Sleep apnea    does not use C-PAP    Past Surgical History:  Procedure Laterality Date  . DILATION AND CURETTAGE OF UTERUS    . GANGLION CYST EXCISION Bilateral   . GANGLION CYST EXCISION Right 10/08/2015   Procedure: REMOVAL GANGLION OF WRIST;  Surgeon: Hessie Knows, MD;  Location: [facility] ORS;  Service: Orthopedics;  Laterality: Right;  . GASTRIC BYPASS    . HAND SURGERY Bilateral Resection  . TUBAL LIGATION      There were no vitals filed for this visit.      Subjective Assessment - 03/19/16 0723    Subjective Pt reports she has a 1-2/10 pain at this time.    Pertinent History Pt has complex medical history including fibromyalgia, RA, multiple surgeries, "I was born crippled". Reports that her R hip is either fractured or something is torn. This is due to significant pain in hip. She is unable to sleep due to significant amount of pain she is in. Pain has been present for years, has been intense for the past 5 months. On same side pt has a self reported R sided meniscal tear. Pt is unable to sleep more than 2-3 hrs due to pain.   How long can you sit comfortably? immediate pain   How long can you stand comfortably? immediate pain   How long can you walk comfortably?  immediate pain   Diagnostic tests negative x-ray, per pt these are always false negatives   Patient Stated Goals decr. pain, rehab knee, return to cardio, return to sleep.   Currently in Pain? Yes   Pain Score 1    Pain Location Hip                 Objective:  Hip rotary machine hip extension, hip abduction ladders, 75#, 80#, 105# and return for hip abduction, 105, 135, 200# for hip extension. Pt had notably more difficulty with R abduction than all other movements.  Monster walks 5x10'x3 with extensive cuing to avoid upper body compensation.  Heel raises 3x10 on each side, UE used to allow for full motion to be achieved.  TG single leg squats, pt unable to perform well due to snapping feeling in knees.  Double leg squats at highest seat setting 3x20.  Pt fatigued following session. Required cuing throughout for improved control, particularly avoiding hip compensation and excessive anterior knee loading.                    PT Long Term Goals - 03/09/16 0908      PT LONG TERM GOAL #1   Title Pt will be able to walk 5 min  confidently with minimal pain to improve functional tolerance   Baseline pain immediately with gait   Time 6   Period Weeks   Status New     PT LONG TERM GOAL #2   Title Pt will be I with HEP to manage symptoms of hip pain as seen by improvement in hip strength to R=L   Baseline 4+/5 on R vs 5 on L   Time 6   Period Weeks   Status New     PT LONG TERM GOAL #3   Title Pt will be able to lift objects from the ground with partial squat/lean   Baseline unable to squat   Time 6   Period Weeks   Status New               Plan - 03/19/16 0757    Clinical Impression Statement Pt is unable to perform squat without pain, even on TG. She uses a gripper to pick objects from the ground due to significant muscle weakness and pain in LE.   Rehab Potential Fair   Clinical Impairments Affecting Rehab Potential medical  comorbidities/motivation   PT Frequency 2x / week   PT Duration 6 weeks   PT Treatment/Interventions ADLs/Self Care Home Management;Aquatic Therapy;Gait training;Therapeutic exercise;Balance training;Neuromuscular re-education;Patient/family education;Manual techniques;Dry needling   PT Next Visit Plan review pt exercises      Patient will benefit from skilled therapeutic intervention in order to improve the following deficits and impairments:  Hypomobility, Decreased strength, Postural dysfunction, Pain, Improper body mechanics, Abnormal gait, Difficulty walking, Impaired flexibility, Decreased endurance, Decreased mobility  Visit Diagnosis: Pain in right hip  Muscle weakness (generalized)     Problem List Patient Active Problem List   Diagnosis Date Noted  . Wheat allergy 02/12/2016  . Hip pain, right 02/12/2016  . Pain management 02/12/2016  . SOB (shortness of breath) 11/13/2015  . Rosacea 04/29/2015  . Numbness 12/03/2014  . Chronic kidney disease, stage 1, normal or increased GFR 10/15/2014  . Sleep apnea 09/11/2014  . Extreme obesity ([place]) 09/11/2014  . Primary generalized hypertrophic osteoarthrosis 09/10/2014  . Obstructive sleep apnea 09/10/2014  . Chronic pain syndrome 09/10/2014  . Irritable bowel syndrome 09/10/2014  . Tobacco abuse 09/10/2014  . Acute anxiety 09/10/2014  . Fibromyalgia 09/10/2014    Fisher,Benjamin PT DPT 03/19/2016, 8:01 AM  [facility] [address], [place], [address] Phone: [phone]   Fax:  [phone]  Name: Cathy Mitchell MRN: [id number] Date of Birth: [birth year]

## 2016-03-26 ENCOUNTER — Ambulatory Visit: Payer: BLUE CROSS/BLUE SHIELD | Admitting: Physical Therapy

## 2016-03-26 DIAGNOSIS — R262 Difficulty in walking, not elsewhere classified: Secondary | ICD-10-CM | POA: Diagnosis not present

## 2016-03-26 DIAGNOSIS — M6281 Muscle weakness (generalized): Secondary | ICD-10-CM

## 2016-03-26 DIAGNOSIS — M25551 Pain in right hip: Secondary | ICD-10-CM | POA: Diagnosis not present

## 2016-03-26 DIAGNOSIS — M25651 Stiffness of right hip, not elsewhere classified: Secondary | ICD-10-CM | POA: Diagnosis not present

## 2016-03-26 NOTE — Therapy (Signed)
[facility] [address] [place], [address] Phone: [phone]   Fax:  [phone]  Physical Therapy Treatment  Patient Details  Name: Cathy Mitchell MRN: [id number] Date of Birth: 1965/06/14 Referring Provider: Julian Hy  Encounter Date: 03/26/2016      PT End of Session - 03/26/16 0733    Visit Number 5   Number of Visits 13   Date for PT Re-Evaluation 04/20/16   PT Start Time 0720   PT Stop Time 0745   PT Time Calculation (min) 25 min   Activity Tolerance Patient tolerated treatment well      Past Medical History:  Diagnosis Date  . Anxiety   . Arthritis   . Fibromyalgia   . Hypertension   . Neuromuscular disorder ([place])   . Sleep apnea    does not use C-PAP    Past Surgical History:  Procedure Laterality Date  . DILATION AND CURETTAGE OF UTERUS    . GANGLION CYST EXCISION Bilateral   . GANGLION CYST EXCISION Right 10/08/2015   Procedure: REMOVAL GANGLION OF WRIST;  Surgeon: Hessie Knows, MD;  Location: [facility] ORS;  Service: Orthopedics;  Laterality: Right;  . GASTRIC BYPASS    . HAND SURGERY Bilateral Resection  . TUBAL LIGATION      There were no vitals filed for this visit.      Subjective Assessment - 03/26/16 0719    Subjective Pt reports she is doing well, she is doing her exercises.   Pertinent History Pt has complex medical history including fibromyalgia, RA, multiple surgeries, "I was born crippled". Reports that her R hip is either fractured or something is torn. This is due to significant pain in hip. She is unable to sleep due to significant amount of pain she is in. Pain has been present for years, has been intense for the past 5 months. On same side pt has a self reported R sided meniscal tear. Pt is unable to sleep more than 2-3 hrs due to pain.   How long can you sit comfortably? immediate pain   How long can you stand comfortably? immediate pain   How long can you walk  comfortably? immediate pain   Diagnostic tests negative x-ray, per pt these are always false negatives   Patient Stated Goals decr. pain, rehab knee, return to cardio, return to sleep.   Currently in Pain? No/denies               Objective: Reviewed HEP Discussed recurrence of pain, educated pt on strategies to manage pain if it returns both with her regular level of activity as well as with any incr. Level of activity.  Modified intensity for pt for her exercises, to incr. Her overall routine intensity and decr. Volume to begin to focus on improving power and focal strength in B calves.  Pt had questions regarding neck pain, PT answered pt questions, pt reports she will be visiting a chiropractor soon as neck pain is limiting sleep.                       PT Long Term Goals - 03/26/16 0733      PT LONG TERM GOAL #1   Title Pt will be able to walk 5 min confidently with minimal pain to improve functional tolerance   Time 6   Period Weeks   Status Achieved     PT LONG TERM GOAL #2  Title Pt will be I with HEP to manage symptoms of hip pain as seen by improvement in hip strength to R=L   Time 6   Period Weeks   Status Achieved     PT LONG TERM GOAL #3   Title Pt will be able to lift objects from the ground with partial squat/lean   Time 6   Period Weeks   Status Achieved               Plan - 03/26/16 0735    Clinical Impression Statement At this time pt requests to end PT. She has had several sessions of minimal to no pain, she is I with her HEP.    Rehab Potential Fair   Clinical Impairments Affecting Rehab Potential medical comorbidities/motivation   PT Frequency 2x / week   PT Duration 6 weeks   PT Treatment/Interventions ADLs/Self Care Home Management;Aquatic Therapy;Gait training;Therapeutic exercise;Balance training;Neuromuscular re-education;Patient/family education;Manual techniques;Dry needling   PT Next Visit Plan review pt  exercises      Patient will benefit from skilled therapeutic intervention in order to improve the following deficits and impairments:  Hypomobility, Decreased strength, Postural dysfunction, Pain, Improper body mechanics, Abnormal gait, Difficulty walking, Impaired flexibility, Decreased endurance, Decreased mobility  Visit Diagnosis: Muscle weakness (generalized)     Problem List Patient Active Problem List   Diagnosis Date Noted  . Wheat allergy 02/12/2016  . Hip pain, right 02/12/2016  . Pain management 02/12/2016  . SOB (shortness of breath) 11/13/2015  . Rosacea 04/29/2015  . Numbness 12/03/2014  . Chronic kidney disease, stage 1, normal or increased GFR 10/15/2014  . Sleep apnea 09/11/2014  . Extreme obesity ([place]) 09/11/2014  . Primary generalized hypertrophic osteoarthrosis 09/10/2014  . Obstructive sleep apnea 09/10/2014  . Chronic pain syndrome 09/10/2014  . Irritable bowel syndrome 09/10/2014  . Tobacco abuse 09/10/2014  . Acute anxiety 09/10/2014  . Fibromyalgia 09/10/2014    Rylen Hou PT DPT 03/26/2016, 7:43 AM  [facility] [address] [place], [address] Phone: [phone]   Fax:  [phone]  Name: Cathy Mitchell MRN: [id number] Date of Birth: Jul 29, 1965

## 2016-03-31 ENCOUNTER — Encounter: Payer: BLUE CROSS/BLUE SHIELD | Admitting: Physical Therapy

## 2016-04-01 DIAGNOSIS — M5414 Radiculopathy, thoracic region: Secondary | ICD-10-CM | POA: Diagnosis not present

## 2016-04-01 DIAGNOSIS — M9901 Segmental and somatic dysfunction of cervical region: Secondary | ICD-10-CM | POA: Diagnosis not present

## 2016-04-01 DIAGNOSIS — M9902 Segmental and somatic dysfunction of thoracic region: Secondary | ICD-10-CM | POA: Diagnosis not present

## 2016-04-01 DIAGNOSIS — R51 Headache: Secondary | ICD-10-CM | POA: Diagnosis not present

## 2016-04-02 ENCOUNTER — Encounter: Payer: BLUE CROSS/BLUE SHIELD | Admitting: Physical Therapy

## 2016-04-02 DIAGNOSIS — M5414 Radiculopathy, thoracic region: Secondary | ICD-10-CM | POA: Diagnosis not present

## 2016-04-02 DIAGNOSIS — M9901 Segmental and somatic dysfunction of cervical region: Secondary | ICD-10-CM | POA: Diagnosis not present

## 2016-04-02 DIAGNOSIS — R51 Headache: Secondary | ICD-10-CM | POA: Diagnosis not present

## 2016-04-02 DIAGNOSIS — M9902 Segmental and somatic dysfunction of thoracic region: Secondary | ICD-10-CM | POA: Diagnosis not present

## 2016-04-03 DIAGNOSIS — M9902 Segmental and somatic dysfunction of thoracic region: Secondary | ICD-10-CM | POA: Diagnosis not present

## 2016-04-03 DIAGNOSIS — M9901 Segmental and somatic dysfunction of cervical region: Secondary | ICD-10-CM | POA: Diagnosis not present

## 2016-04-03 DIAGNOSIS — M5414 Radiculopathy, thoracic region: Secondary | ICD-10-CM | POA: Diagnosis not present

## 2016-04-03 DIAGNOSIS — R51 Headache: Secondary | ICD-10-CM | POA: Diagnosis not present

## 2016-04-06 DIAGNOSIS — M5414 Radiculopathy, thoracic region: Secondary | ICD-10-CM | POA: Diagnosis not present

## 2016-04-06 DIAGNOSIS — R51 Headache: Secondary | ICD-10-CM | POA: Diagnosis not present

## 2016-04-06 DIAGNOSIS — M9901 Segmental and somatic dysfunction of cervical region: Secondary | ICD-10-CM | POA: Diagnosis not present

## 2016-04-06 DIAGNOSIS — M9902 Segmental and somatic dysfunction of thoracic region: Secondary | ICD-10-CM | POA: Diagnosis not present

## 2016-04-07 ENCOUNTER — Encounter: Payer: BLUE CROSS/BLUE SHIELD | Admitting: Physical Therapy

## 2016-04-08 DIAGNOSIS — M5414 Radiculopathy, thoracic region: Secondary | ICD-10-CM | POA: Diagnosis not present

## 2016-04-08 DIAGNOSIS — M9901 Segmental and somatic dysfunction of cervical region: Secondary | ICD-10-CM | POA: Diagnosis not present

## 2016-04-08 DIAGNOSIS — M9902 Segmental and somatic dysfunction of thoracic region: Secondary | ICD-10-CM | POA: Diagnosis not present

## 2016-04-08 DIAGNOSIS — R51 Headache: Secondary | ICD-10-CM | POA: Diagnosis not present

## 2016-04-09 ENCOUNTER — Encounter: Payer: BLUE CROSS/BLUE SHIELD | Admitting: Physical Therapy

## 2016-04-10 DIAGNOSIS — M9902 Segmental and somatic dysfunction of thoracic region: Secondary | ICD-10-CM | POA: Diagnosis not present

## 2016-04-10 DIAGNOSIS — M9901 Segmental and somatic dysfunction of cervical region: Secondary | ICD-10-CM | POA: Diagnosis not present

## 2016-04-10 DIAGNOSIS — M5414 Radiculopathy, thoracic region: Secondary | ICD-10-CM | POA: Diagnosis not present

## 2016-04-10 DIAGNOSIS — R51 Headache: Secondary | ICD-10-CM | POA: Diagnosis not present

## 2016-04-13 DIAGNOSIS — M9902 Segmental and somatic dysfunction of thoracic region: Secondary | ICD-10-CM | POA: Diagnosis not present

## 2016-04-13 DIAGNOSIS — M5414 Radiculopathy, thoracic region: Secondary | ICD-10-CM | POA: Diagnosis not present

## 2016-04-13 DIAGNOSIS — M9901 Segmental and somatic dysfunction of cervical region: Secondary | ICD-10-CM | POA: Diagnosis not present

## 2016-04-13 DIAGNOSIS — R51 Headache: Secondary | ICD-10-CM | POA: Diagnosis not present

## 2016-04-14 ENCOUNTER — Encounter: Payer: BLUE CROSS/BLUE SHIELD | Admitting: Physical Therapy

## 2016-04-16 ENCOUNTER — Encounter: Payer: BLUE CROSS/BLUE SHIELD | Admitting: Physical Therapy

## 2016-04-21 ENCOUNTER — Encounter: Payer: BLUE CROSS/BLUE SHIELD | Admitting: Physical Therapy

## 2016-04-23 ENCOUNTER — Encounter: Payer: BLUE CROSS/BLUE SHIELD | Admitting: Physical Therapy

## 2016-04-28 ENCOUNTER — Encounter: Payer: BLUE CROSS/BLUE SHIELD | Admitting: Physical Therapy

## 2016-05-05 ENCOUNTER — Encounter: Payer: BLUE CROSS/BLUE SHIELD | Admitting: Physical Therapy

## 2016-05-07 ENCOUNTER — Encounter: Payer: BLUE CROSS/BLUE SHIELD | Admitting: Physical Therapy

## 2016-05-12 ENCOUNTER — Encounter: Payer: BLUE CROSS/BLUE SHIELD | Admitting: Physical Therapy

## 2016-05-14 ENCOUNTER — Encounter: Payer: BLUE CROSS/BLUE SHIELD | Admitting: Physical Therapy

## 2016-05-19 ENCOUNTER — Encounter: Payer: BLUE CROSS/BLUE SHIELD | Admitting: Physical Therapy

## 2016-05-21 ENCOUNTER — Encounter: Payer: BLUE CROSS/BLUE SHIELD | Admitting: Physical Therapy

## 2016-05-26 ENCOUNTER — Encounter: Payer: BLUE CROSS/BLUE SHIELD | Admitting: Physical Therapy

## 2016-05-28 ENCOUNTER — Encounter: Payer: BLUE CROSS/BLUE SHIELD | Admitting: Physical Therapy

## 2016-07-17 ENCOUNTER — Encounter: Payer: Self-pay | Admitting: Unknown Physician Specialty

## 2016-07-17 ENCOUNTER — Ambulatory Visit (INDEPENDENT_AMBULATORY_CARE_PROVIDER_SITE_OTHER): Payer: BLUE CROSS/BLUE SHIELD | Admitting: Unknown Physician Specialty

## 2016-07-17 DIAGNOSIS — M25561 Pain in right knee: Secondary | ICD-10-CM | POA: Diagnosis not present

## 2016-07-17 DIAGNOSIS — G8929 Other chronic pain: Secondary | ICD-10-CM | POA: Insufficient documentation

## 2016-07-17 MED ORDER — TRAMADOL HCL 50 MG PO TABS: 100.0000 mg | ORAL_TABLET | Freq: Four times a day (QID) | ORAL | 1 refills | Status: DC | PRN

## 2016-07-17 NOTE — Progress Notes (Signed)
   BP 126/77   Pulse 66   Temp 98.4 F (36.9 C)   Wt 276 lb 9.6 oz (125.5 kg)   SpO2 97%   BMI 42.06 kg/m    Subjective:    Patient ID: Cathy Mitchell, female    DOB: [birth year], 51 y.o.   MRN: [id number]  HPI: Cathy Mitchell is a 51 y.o. female  Chief Complaint  Patient presents with  . Knee Pain    right knee, states she feels like her meniscus is about to tear again   Pt has a history of a meniscus tear of her right knee.  She never had surgery.  States she asked her orthopedist if there is a brace for the knee.  Pt states her right knee is bothering her now she is body building.  Points to the front of her right knee as to where she is having pain.    Relevant past medical, surgical, family and social history reviewed and updated as indicated. Interim medical history since our last visit reviewed. Allergies and medications reviewed and updated.  Review of Systems  Per HPI unless specifically indicated above     Objective:    BP 126/77   Pulse 66   Temp 98.4 F (36.9 C)   Wt 276 lb 9.6 oz (125.5 kg)   SpO2 97%   BMI 42.06 kg/m   Wt Readings from Last 3 Encounters:  07/17/16 276 lb 9.6 oz (125.5 kg)  02/12/16 275 lb 9.6 oz (125 kg)  01/17/16 275 lb (124.7 kg)    Physical Exam  Constitutional: She is oriented to person, place, and time. She appears well-developed and well-nourished. No distress.  HENT:  Head: Normocephalic and atraumatic.  Eyes: Conjunctivae and lids are normal. Right eye exhibits no discharge. Left eye exhibits no discharge. No scleral icterus.  Cardiovascular: Normal rate.   Pulmonary/Chest: Effort normal.  Abdominal: Normal appearance. There is no splenomegaly or hepatomegaly.  Musculoskeletal: Normal range of motion.       Right knee: She exhibits swelling. She exhibits normal range of motion, no effusion, no ecchymosis, no deformity, no laceration, no erythema, normal alignment and no LCL laxity.  Neurological: She is alert and oriented to  person, place, and time.  Skin: Skin is intact. No rash noted. No pallor.  Psychiatric: She has a normal mood and affect. Her behavior is normal. Judgment and thought content normal.      Assessment & Plan:   Problem List Items Addressed This Visit      Unprioritized   Right knee pain    Pt with right knee pain and history of probable degenerative meniscus tear.  Rx for Tramadol to take on occasion to take with her Tylenol.  Refer to PT.        Relevant Orders   Ambulatory referral to Physical Therapy       Follow up plan: Return if symptoms worsen or fail to improve.

## 2016-07-17 NOTE — Assessment & Plan Note (Signed)
Pt with right knee pain and history of probable degenerative meniscus tear.  Rx for Tramadol to take on occasion to take with her Tylenol.  Refer to PT.

## 2016-07-20 DIAGNOSIS — M25561 Pain in right knee: Secondary | ICD-10-CM | POA: Diagnosis not present

## 2016-07-31 ENCOUNTER — Encounter: Payer: BLUE CROSS/BLUE SHIELD | Admitting: Unknown Physician Specialty

## 2016-07-31 ENCOUNTER — Ambulatory Visit (INDEPENDENT_AMBULATORY_CARE_PROVIDER_SITE_OTHER): Payer: BLUE CROSS/BLUE SHIELD | Admitting: Unknown Physician Specialty

## 2016-07-31 ENCOUNTER — Encounter: Payer: Self-pay | Admitting: Unknown Physician Specialty

## 2016-07-31 VITALS — BP 119/81 | HR 81 | Temp 97.9°F | Ht 67.0 in | Wt 272.3 lb

## 2016-07-31 DIAGNOSIS — L309 Dermatitis, unspecified: Secondary | ICD-10-CM

## 2016-07-31 DIAGNOSIS — Z0000 Encounter for general adult medical examination without abnormal findings: Secondary | ICD-10-CM | POA: Diagnosis not present

## 2016-07-31 DIAGNOSIS — M797 Fibromyalgia: Secondary | ICD-10-CM

## 2016-07-31 NOTE — Progress Notes (Signed)
   BP 119/81   Pulse 81   Temp 97.9 F (36.6 C)   Ht 5\' 7"  (1.702 m)   Wt 272 lb 4.8 oz (123.5 kg)   SpO2 98%   BMI 42.65 kg/m    Subjective:    Patient ID: Cathy Mitchell, female    DOB: 1965-07-13, 51 y.o.   MRN: [id number]  HPI: Cathy Mitchell is a 51 y.o. female  Chief Complaint  Patient presents with  . Annual Exam   Pt is doing well following obesity surgery.  She is very involved with fitness and spending a minimum of 1 1/2 hours at the gym.    Getting PT for her knee and it is improving  CC today is cracks on hands.    Depression screen [facility] 2/9 07/31/2016 12/21/2014  Decreased Interest 0 0  Down, Depressed, Hopeless 0 0  PHQ - 2 Score 0 0   Fibromyalgia symptoms are improved   Relevant past medical, surgical, family and social history reviewed and updated as indicated. Interim medical history since our last visit reviewed. Allergies and medications reviewed and updated.  Review of Systems  Constitutional: Negative.   HENT: Negative.   Eyes: Negative.   Respiratory: Negative.   Cardiovascular: Negative.   Gastrointestinal: Negative.   Endocrine: Negative.   Genitourinary: Negative.   Allergic/Immunologic: Negative.   Neurological: Negative.   Hematological: Negative.   Psychiatric/Behavioral: Negative.     Per HPI unless specifically indicated above     Objective:    BP 119/81   Pulse 81   Temp 97.9 F (36.6 C)   Ht 5\' 7"  (1.702 m)   Wt 272 lb 4.8 oz (123.5 kg)   SpO2 98%   BMI 42.65 kg/m   Wt Readings from Last 3 Encounters:  07/31/16 272 lb 4.8 oz (123.5 kg)  07/17/16 276 lb 9.6 oz (125.5 kg)  02/12/16 275 lb 9.6 oz (125 kg)    Physical Exam  Constitutional: She is oriented to person, place, and time. She appears well-developed and well-nourished. No distress.  HENT:  Head: Normocephalic and atraumatic.  Eyes: Conjunctivae and lids are normal. Right eye exhibits no discharge. Left eye exhibits no discharge. No scleral icterus.  Neck:  Normal range of motion. Neck supple. No JVD present. Carotid bruit is not present.  Cardiovascular: Normal rate, regular rhythm and normal heart sounds.   Pulmonary/Chest: Effort normal and breath sounds normal.  Abdominal: Normal appearance. There is no splenomegaly or hepatomegaly.  Musculoskeletal: Normal range of motion.  Neurological: She is alert and oriented to person, place, and time.  Skin: Skin is warm, dry and intact. No rash noted. No pallor.  Cracking knuckles of right hand  Psychiatric: She has a normal mood and affect. Her behavior is normal. Judgment and thought content normal.      Assessment & Plan:   Problem List Items Addressed This Visit      Unprioritized   Eczema of right hand    Discussed creams and lotions      Fibromyalgia    Improved       Other Visit Diagnoses    Annual physical exam    -  Primary   Relevant Orders   CBC with Differential/Platelet   Comprehensive metabolic panel   Lipid Panel w/o Chol/HDL Ratio   TSH   MM DIGITAL SCREENING BILATERAL       Follow up plan: Return if symptoms worsen or fail to improve.

## 2016-07-31 NOTE — Patient Instructions (Addendum)
Use creams or ointments or bag balm  If you use a lotion, use Lac-Hydrin.

## 2016-07-31 NOTE — Assessment & Plan Note (Signed)
Improved

## 2016-07-31 NOTE — Assessment & Plan Note (Signed)
Discussed creams and lotions

## 2016-08-01 LAB — LIPID PANEL W/O CHOL/HDL RATIO
Cholesterol, Total: 145 mg/dL (ref 100–199)
HDL: 48 mg/dL (ref >39)
LDL Calculated: 73 mg/dL (ref 0–99)
Triglycerides: 121 mg/dL (ref 0–149)
VLDL CHOLESTEROL CAL: 24 mg/dL (ref 5–40)

## 2016-08-01 LAB — CBC WITH DIFFERENTIAL/PLATELET
Basophils Absolute: 0.1 10*3/uL (ref 0.0–0.2)
Basos: 1 %
EOS (ABSOLUTE): 0.2 10*3/uL (ref 0.0–0.4)
EOS: 2 %
HEMATOCRIT: 39.8 % (ref 34.0–46.6)
HEMOGLOBIN: 13.4 g/dL (ref 11.1–15.9)
IMMATURE GRANS (ABS): 0 10*3/uL (ref 0.0–0.1)
IMMATURE GRANULOCYTES: 0 %
LYMPHS ABS: 2.7 10*3/uL (ref 0.7–3.1)
Lymphs: 28 %
MCH: 28.9 pg (ref 26.6–33.0)
MCHC: 33.7 g/dL (ref 31.5–35.7)
MCV: 86 fL (ref 79–97)
Monocytes Absolute: 0.7 10*3/uL (ref 0.1–0.9)
Monocytes: 7 %
NEUTROS PCT: 62 %
Neutrophils Absolute: 5.8 10*3/uL (ref 1.4–7.0)
Platelets: 346 10*3/uL (ref 150–379)
RBC: 4.64 x10E6/uL (ref 3.77–5.28)
RDW: 13.9 % (ref 12.3–15.4)
WBC: 9.4 10*3/uL (ref 3.4–10.8)

## 2016-08-01 LAB — COMPREHENSIVE METABOLIC PANEL
A/G RATIO: 1.8 (ref 1.2–2.2)
ALBUMIN: 4.6 g/dL (ref 3.5–5.5)
ALT: 17 IU/L (ref 0–32)
AST: 17 IU/L (ref 0–40)
Alkaline Phosphatase: 84 IU/L (ref 39–117)
BUN/Creatinine Ratio: 27 — ABNORMAL HIGH (ref 9–23)
BUN: 19 mg/dL (ref 6–24)
Bilirubin Total: 0.3 mg/dL (ref 0.0–1.2)
CALCIUM: 9.6 mg/dL (ref 8.7–10.2)
CO2: 24 mmol/L (ref 18–29)
Chloride: 98 mmol/L (ref 96–106)
Creatinine, Ser: 0.7 mg/dL (ref 0.57–1.00)
GFR, EST AFRICAN AMERICAN: 118 mL/min/{1.73_m2} (ref >59)
GFR, EST NON AFRICAN AMERICAN: 102 mL/min/{1.73_m2} (ref >59)
Globulin, Total: 2.6 g/dL (ref 1.5–4.5)
Glucose: 89 mg/dL (ref 65–99)
POTASSIUM: 4.5 mmol/L (ref 3.5–5.2)
Sodium: 139 mmol/L (ref 134–144)
TOTAL PROTEIN: 7.2 g/dL (ref 6.0–8.5)

## 2016-08-01 LAB — TSH: TSH: 1.06 u[IU]/mL (ref 0.450–4.500)

## 2016-08-04 ENCOUNTER — Encounter: Payer: Self-pay | Admitting: Pain Medicine

## 2016-08-04 ENCOUNTER — Ambulatory Visit
Admission: RE | Admit: 2016-08-04 | Discharge: 2016-08-04 | Disposition: A | Discharge status: Home / Self Care | Payer: BLUE CROSS/BLUE SHIELD | Source: Ambulatory Visit | Attending: Pain Medicine | Admitting: Pain Medicine

## 2016-08-04 ENCOUNTER — Other Ambulatory Visit
Admission: RE | Admit: 2016-08-04 | Discharge: 2016-08-04 | Disposition: A | Discharge status: Home / Self Care | Payer: BLUE CROSS/BLUE SHIELD | Source: Ambulatory Visit | Attending: Pain Medicine | Admitting: Pain Medicine

## 2016-08-04 ENCOUNTER — Ambulatory Visit (HOSPITAL_BASED_OUTPATIENT_CLINIC_OR_DEPARTMENT_OTHER): Payer: BLUE CROSS/BLUE SHIELD | Admitting: Pain Medicine

## 2016-08-04 VITALS — BP 133/71 | HR 75 | Temp 98.1°F | Resp 18 | Ht 68.0 in | Wt 273.0 lb

## 2016-08-04 DIAGNOSIS — E669 Obesity, unspecified: Secondary | ICD-10-CM | POA: Insufficient documentation

## 2016-08-04 DIAGNOSIS — Z79899 Other long term (current) drug therapy: Secondary | ICD-10-CM | POA: Insufficient documentation

## 2016-08-04 DIAGNOSIS — M1711 Unilateral primary osteoarthritis, right knee: Secondary | ICD-10-CM | POA: Insufficient documentation

## 2016-08-04 DIAGNOSIS — M25561 Pain in right knee: Secondary | ICD-10-CM

## 2016-08-04 DIAGNOSIS — M797 Fibromyalgia: Secondary | ICD-10-CM | POA: Diagnosis not present

## 2016-08-04 DIAGNOSIS — Z79891 Long term (current) use of opiate analgesic: Secondary | ICD-10-CM | POA: Diagnosis not present

## 2016-08-04 DIAGNOSIS — M1611 Unilateral primary osteoarthritis, right hip: Secondary | ICD-10-CM | POA: Diagnosis not present

## 2016-08-04 DIAGNOSIS — M150 Primary generalized (osteo)arthritis: Secondary | ICD-10-CM | POA: Diagnosis not present

## 2016-08-04 DIAGNOSIS — M25551 Pain in right hip: Secondary | ICD-10-CM

## 2016-08-04 DIAGNOSIS — M542 Cervicalgia: Secondary | ICD-10-CM | POA: Insufficient documentation

## 2016-08-04 DIAGNOSIS — M170 Bilateral primary osteoarthritis of knee: Secondary | ICD-10-CM

## 2016-08-04 DIAGNOSIS — G8929 Other chronic pain: Secondary | ICD-10-CM

## 2016-08-04 DIAGNOSIS — Z6841 Body Mass Index (BMI) 40.0 and over, adult: Secondary | ICD-10-CM | POA: Diagnosis not present

## 2016-08-04 DIAGNOSIS — Z96651 Presence of right artificial knee joint: Secondary | ICD-10-CM | POA: Insufficient documentation

## 2016-08-04 DIAGNOSIS — M8949 Other hypertrophic osteoarthropathy, multiple sites: Secondary | ICD-10-CM | POA: Insufficient documentation

## 2016-08-04 DIAGNOSIS — M25562 Pain in left knee: Secondary | ICD-10-CM | POA: Diagnosis not present

## 2016-08-04 DIAGNOSIS — F1190 Opioid use, unspecified, uncomplicated: Secondary | ICD-10-CM | POA: Insufficient documentation

## 2016-08-04 DIAGNOSIS — G894 Chronic pain syndrome: Secondary | ICD-10-CM | POA: Insufficient documentation

## 2016-08-04 DIAGNOSIS — G4733 Obstructive sleep apnea (adult) (pediatric): Secondary | ICD-10-CM | POA: Diagnosis not present

## 2016-08-04 DIAGNOSIS — M159 Polyosteoarthritis, unspecified: Secondary | ICD-10-CM | POA: Insufficient documentation

## 2016-08-04 LAB — C-REACTIVE PROTEIN

## 2016-08-04 LAB — MAGNESIUM: Magnesium: 2.2 mg/dL (ref 1.7–2.4)

## 2016-08-04 LAB — SEDIMENTATION RATE: Sed Rate: 15 mm/hr (ref 0–20)

## 2016-08-04 LAB — VITAMIN B12: VITAMIN B 12: 457 pg/mL (ref 180–914)

## 2016-08-04 NOTE — Progress Notes (Signed)
Patient's Name: Cathy Mitchell  MRN: [id number]  Referring Provider: Beverly Gust, MD  DOB: 04-26-1965  PCP: Kathrine Haddock, NP  DOS: 08/04/2016  Note by: Kathlen Brunswick. Dossie Arbour, MD  Service setting: Ambulatory outpatient  Specialty: Interventional Pain Management  Location: [facility] (AMB) Pain Management Facility    Patient type: New Patient   Primary Reason(s) for Visit: Initial Patient Evaluation CC: Knee Pain (right); Hip Pain (right); and Neck Pain  HPI  [name] is a 51 y.o. year old, female patient, who comes today for an initial evaluation. She has Primary generalized hypertrophic osteoarthrosis; Obstructive sleep apnea; Chronic pain syndrome; Irritable bowel syndrome; Tobacco abuse; Acute anxiety; Fibromyalgia; Sleep apnea; Extreme obesity; Chronic kidney disease, stage 1, normal or increased GFR; Numbness; Rosacea; SOB (shortness of breath); Wheat allergy; Chronic hip pain (Location of Tertiary source of pain) (Right); Pain management; Chronic knee pain (Right); Eczema of right hand; Tricompartment osteoarthritis of knee (Right); Osteoarthritis of knee (Right); Osteoarthritis; Long term (current) use of opiate analgesic; Long term prescription opiate use; Opiate use; Chronic knee pain (Location of Primary Source of Pain) (Bilateral) (R>L); Osteoarthritis of knee (Bilateral) (R>L); Chronic neck pain (Location of Secondary source of pain) (Bilateral); and Osteoarthritis of hip (Right) on her problem list.. Her primarily concern today is the Knee Pain (right); Hip Pain (right); and Neck Pain  Pain Assessment: Self-Reported Pain Score: 10-Worst pain ever/10 Clinically the patient looks like a 3/10 Reported level is inconsistent with clinical observations. Information on the proper use of the pain scale provided to the patient today Pain Type: Chronic pain Pain Location: Knee Pain Orientation: Right Pain Descriptors / Indicators: Stabbing Pain Frequency: Constant  Onset and Duration: Sudden,  Gradual and Present longer than 3 months Cause of pain: birth defect Severity: Getting worse, NAS-11 at its worse: 100/10, NAS-11 at its best: 10/10, NAS-11 now: 50/10 and NAS-11 on the average: 10/10 Timing: Morning, Afternoon and Night Aggravating Factors: Bending, Climbing, Kneeling, Prolonged sitting, Prolonged standing, Stooping , Twisting and Walking uphill Alleviating Factors: Medications Associated Problems: Dizziness, Fatigue, Inability to concentrate, Numbness, Swelling, Tingling, Weakness, Pain that wakes patient up and Pain that does not allow patient to sleep Quality of Pain: Aching, Agonizing, Annoying, Burning, Constant, Cruel, Deep, Disabling, Distressing, Dreadful, Exhausting, Fearful, Feeling of weight, Getting longer, Heavy, Horrible, Nagging, Pressure-like, Pulsating, Punishing, Sharp, Shooting, Sickening, Splitting, Stabbing, Tender, Throbbing, Tingling, Tiring, Toothache-like and Uncomfortable Previous Examinations or Tests: Bone scan, MRI scan, X-rays, Orthoperdic evaluation and Chiropractic evaluation Previous Treatments: Chiropractic manipulations, Physical Therapy and Stretching exercises  The patient comes into the clinics today for the first time for a chronic pain management evaluation. The patient started the visit by saying that she cannot take any NSAIDs or narcotics. Apparently in the past she used to use a significant amount of his states which caused some kidney problems and she states that she was told that she should no longer use them. In terms of the narcotics, she indicates that opioids he of her hives, itching, lightheadedness, nausea vomiting, and a very uncomfortable burning sensation in her head. According to the patient her primary area of pain is that of the knees with the right knee being worse than the left. She denies any prior surgery in the area of the right knee. With regards to this right knee, she also denies any prior nerve blocks but does admit to  having had one intra-articular knee injection done at [facility] orthopedics. She indicates that this provided her with approximately 3 days  of pain relief. She also indicates that she thinks they injected "cortisone". She is currently undergoing physical therapy at [place]. Because of her insurance, it has consisted only 1 visit and instructions how to continue exercising at home. She also admits to having had an MRI and some x-rays done last year. The MRI was done on 09/18/2015 and it revealed: a small radial tear of the free edge of the body of the lateral meniscus, as well as Tricompartmental cartilage abnormalities most severe in the patellofemoral compartment. In terms of the left knee the patient denies any prior surgeries, nerve blocks, or an MRI. She does admit to having had a joint injection done by the [facility] orthopedic department using steroids and it also provided her with approximately 3 days of pain relief. She has had some x-rays done on 2017 and she has been doing physical therapy for both knees, not just one. The next day her pain is that of the neck with the pain being in the posterior aspect and bilateral. She was unable to indicate that one side was worse than the other. She denies any shoulder or upper extremity pain. She does indicate having recurrent, frequent headaches that are bilateral in the area of the temples and they feel like they create a lot of pressure behind the eyes. She denies any prior neck surgery, nerve blocks, MRIs, but does admit some x-rays done in the distant past by her chiropractor. She indicates that the chiropractor does provide her with some relief pain, but her insurance does not call over many visits. She indicates that the last time that she saw him was approximately 1 month ago. The next area of pain is that of the right hip. The patient denies any pain on the left hip. She denies any prior surgeries, nerve blocks, joint injections,  physical therapy, or MRIs. She does admit having had some x-rays of her hips in 2017.  She denies ever having been to another pain clinic and she indicates having lost approximately 103 pounds over the past year by exercising, dieting under the supervision of a nutritionist, and having had a gastric bypass on 03/18/2015. She indicates that despite the fact that she has lost over 100 pounds, her knee pains have not improved. She indicates having been told that she should avoid having surgery and this is the reason why she is here. She is looking for other alternatives to control this pain without the use of narcotics, nonsteroidal anti-inflammatory drugs, or joint replacements.  Today I took the time to provide the patient with information regarding my pain practice. The patient was informed that my practice is divided into two sections: an interventional pain management section, as well as a completely separate and distinct medication management section. I explained that I have procedure days for my interventional therapies, and evaluation days for follow-ups and medication management. Because of the amount of documentation required during both, they are kept separated. This means that there is the possibility that she may be scheduled for a procedure on one day, and medication management the next. I have also informed her that because of staffing and facility limitations, I no longer take patients for medication management only. To illustrate the reasons for this, I gave the patient the example of surgeons, and how inappropriate it would be to refer a patient to his/her care, just to write for the post-surgical antibiotics on a surgery done by a different surgeon.   Because interventional pain management is  my board-certified specialty, the patient was informed that joining my practice means that they are open to any and all interventional therapies. I made it clear that this does not mean that they will be  forced to have any procedures done. What this means is that I believe interventional therapies to be essential part of the diagnosis and proper management of chronic pain conditions. Therefore, patients not interested in these interventional alternatives will be better served under the care of a different practitioner.  The patient was also made aware of my Comprehensive Pain Management Safety Guidelines where by joining my practice, they limit all of their nerve blocks and joint injections to those done by our practice, for as long as we are retained to manage their care.   Historic Controlled Substance Pharmacotherapy Review  PMP and historical list of controlled substances: Tramadol 50 mg; hydrocodone/APAP 7.5/325; hydrocodone/APAP 5/500. Highest opioid analgesic regimen found: Hydrocodone/APAP 7.5/325 Most recent opioid analgesic: Tramadol 50 mg Current opioid analgesics: None Highest recorded MME/day: 40 mg/day MME/day: 0 mg/day Medications: The patient did not bring the medication(s) to the appointment, as requested in our "New Patient Package" Pharmacodynamics: Desired effects: Analgesia: The patient reports >50% benefit. Reported improvement in function: The patient reports medication allows her to accomplish basic ADLs. Clinically meaningful improvement in function (CMIF): Sustained CMIF goals met Perceived effectiveness: Described as relatively effective, allowing for increase in activities of daily living (ADL) Undesirable effects: Side-effects or Adverse reactions: None reported Historical Monitoring: The patient  reports that she does not use drugs. List of all UDS Test(s): No results found for: MDMA, COCAINSCRNUR, PCPSCRNUR, PCPQUANT, CANNABQUANT, THCU, [place] List of all Serum Drug Screening Test(s):  No results found for: AMPHSCRSER, BARBSCRSER, BENZOSCRSER, COCAINSCRSER, PCPSCRSER, PCPQUANT, THCSCRSER, CANNABQUANT, OPIATESCRSER, OXYSCRSER, PROPOXSCRSER Historical Background  Evaluation: [place] PDMP: Six (6) year initial data search conducted.             [place] Department of public safety, offender search: Editor, commissioning Information) Non-contributory Risk Assessment Profile: Aberrant behavior: None observed or detected today Risk factors for fatal opioid overdose: None identified today Fatal overdose hazard ratio (HR): Calculation deferred Non-fatal overdose hazard ratio (HR): Calculation deferred Risk of opioid abuse or dependence: 0.7-3.0% with doses ? 36 MME/day and 6.1-26% with doses ? 120 MME/day. Substance use disorder (SUD) risk level: Pending results of Medical Psychology Evaluation for SUD Opioid risk tool (ORT) (Total Score): 1  ORT Scoring interpretation table:  Score <3 = Low Risk for SUD  Score between 4-7 = Moderate Risk for SUD  Score >8 = High Risk for Opioid Abuse   PHQ-2 Depression Scale:  Total score: 0  PHQ-2 Scoring interpretation table: (Score and probability of major depressive disorder)  Score 0 = No depression  Score 1 = 15.4% Probability  Score 2 = 21.1% Probability  Score 3 = 38.4% Probability  Score 4 = 45.5% Probability  Score 5 = 56.4% Probability  Score 6 = 78.6% Probability   PHQ-9 Depression Scale:  Total score: 0  PHQ-9 Scoring interpretation table:  Score 0-4 = No depression  Score 5-9 = Mild depression  Score 10-14 = Moderate depression  Score 15-19 = Moderately severe depression  Score 20-27 = Severe depression (2.4 times higher risk of SUD and 2.89 times higher risk of overuse)   Pharmacologic Plan: Pending ordered tests and/or consults  Meds  The patient has a current medication list which includes the following prescription(s): acetaminophen, calcium citrate-vitamin d, cholecalciferol, ferrous sulfate, multiple vitamins/womens, vitamin b-12, vitamin c,  zinc, clindamycin-benzoyl per (refr), targadox, and topiramate.  Current Outpatient Prescriptions on File Prior to Visit  Medication Sig  . acetaminophen (TYLENOL) 325  MG tablet Take by mouth.  . calcium citrate-vitamin D (CITRACAL+D) 315-200 MG-UNIT tablet Take 1 tablet by mouth daily.  . cholecalciferol (VITAMIN D) 1000 units tablet Take 1,000 Units by mouth daily.  . Ferrous Sulfate (IRON SUPPLEMENT PO) Take 1 tablet by mouth every other day.  . Multiple Vitamins-Minerals (MULTIPLE VITAMINS/WOMENS) tablet Take 1 tablet by mouth daily.  . vitamin B-12 (CYANOCOBALAMIN) 500 MCG tablet Take 500 mcg by mouth daily.  . vitamin C (ASCORBIC ACID) 500 MG tablet Take 500 mg by mouth daily.  . Zinc 50 MG TABS Take 1 tablet by mouth daily.  . Clindamycin-Benzoyl Per, Refr, gel Apply topically daily.   . TARGADOX 50 MG TABS Take 50 mg by mouth daily.  Marland Kitchen topiramate (TOPAMAX) 25 MG capsule Take 25 mg by mouth 2 (two) times daily.   No current facility-administered medications on file prior to visit.    Imaging Review  Cervical Imaging: Cervical MR wo contrast: No results found for this or any previous visit. Cervical MR wo contrast: No results found for this or any previous visit. Cervical MR w/wo contrast: No results found for this or any previous visit. Cervical MR w contrast: No results found for this or any previous visit. Cervical CT wo contrast: No results found for this or any previous visit. Cervical CT w/wo contrast: No results found for this or any previous visit. Cervical CT w/wo contrast: No results found for this or any previous visit. Cervical CT w contrast: No results found for this or any previous visit. Cervical CT outside: No results found for this or any previous visit. Cervical DG 1 view: No results found for this or any previous visit. Cervical DG 2-3 views: No results found for this or any previous visit. Cervical DG F/E views: No results found for this or any previous visit. Cervical DG 2-3 clearing views: No results found for this or any previous visit. Cervical DG Bending/F/E views: No results found for this or any previous  visit. Cervical DG complete: No results found for this or any previous visit. Cervical DG Myelogram views: No results found for this or any previous visit. Cervical DG Myelogram views: No results found for this or any previous visit. Cervical Discogram views: No results found for this or any previous visit.  Shoulder Imaging: Shoulder-R MR w contrast: No results found for this or any previous visit. Shoulder-L MR w contrast: No results found for this or any previous visit. Shoulder-R MR w/wo contrast: No results found for this or any previous visit. Shoulder-L MR w/wo contrast: No results found for this or any previous visit. Shoulder-R MR wo contrast: No results found for this or any previous visit. Shoulder-L MR wo contrast: No results found for this or any previous visit. Shoulder-R CT w contrast: No results found for this or any previous visit. Shoulder-L CT w contrast: No results found for this or any previous visit. Shoulder-R CT w/wo contrast: No results found for this or any previous visit. Shoulder-L CT w/wo contrast: No results found for this or any previous visit. Shoulder-R CT wo contrast: No results found for this or any previous visit. Shoulder-L CT wo contrast: No results found for this or any previous visit. Shoulder-R DG Arthrogram: No results found for this or any previous visit. Shoulder-L DG Arthrogram: No results found for this or any previous  visit. Shoulder-R DG 1 view: No results found for this or any previous visit. Shoulder-L DG 1 view: No results found for this or any previous visit. Shoulder-R DG: No results found for this or any previous visit. Shoulder-L DG: No results found for this or any previous visit.  Thoracic Imaging: Thoracic MR wo contrast: No results found for this or any previous visit. Thoracic MR wo contrast: No results found for this or any previous visit. Thoracic MR w/wo contrast: No results found for this or any previous visit. Thoracic MR  w contrast: No results found for this or any previous visit. Thoracic CT wo contrast: No results found for this or any previous visit. Thoracic CT w/wo contrast: No results found for this or any previous visit. Thoracic CT w/wo contrast: No results found for this or any previous visit. Thoracic CT w contrast: No results found for this or any previous visit. Thoracic DG 2-3 views: No results found for this or any previous visit. Thoracic DG 4 views: No results found for this or any previous visit. Thoracic DG: No results found for this or any previous visit. Thoracic DG w/swimmers view: No results found for this or any previous visit. Thoracic DG Myelogram views: No results found for this or any previous visit. Thoracic DG Myelogram views: No results found for this or any previous visit.  Lumbosacral Imaging: Lumbar MR wo contrast: No results found for this or any previous visit. Lumbar MR wo contrast: No results found for this or any previous visit. Lumbar MR w/wo contrast: No results found for this or any previous visit. Lumbar MR w contrast: No results found for this or any previous visit. Lumbar CT wo contrast: No results found for this or any previous visit. Lumbar CT w/wo contrast: No results found for this or any previous visit. Lumbar CT w/wo contrast: No results found for this or any previous visit. Lumbar CT w contrast: No results found for this or any previous visit. Lumbar DG 1V: No results found for this or any previous visit. Lumbar DG 1V (Clearing): No results found for this or any previous visit. Lumbar DG 2-3V (Clearing): No results found for this or any previous visit. Lumbar DG 2-3 views: No results found for this or any previous visit. Lumbar DG (Complete) 4+V: No results found for this or any previous visit. Lumbar DG F/E views: No results found for this or any previous visit. Lumbar DG Bending views: No results found for this or any previous visit. Lumbar DG Myelogram  views: No results found for this or any previous visit. Lumbar DG Myelogram: No results found for this or any previous visit. Lumbar DG Myelogram: No results found for this or any previous visit. Lumbar DG Myelogram: No results found for this or any previous visit. Lumbar DG Myelogram Lumbosacral: No results found for this or any previous visit. Lumbar DG Diskogram views: No results found for this or any previous visit. Lumbar DG Diskogram views: No results found for this or any previous visit. Lumbar DG Epidurogram OP: No results found for this or any previous visit. Lumbar DG Epidurogram IP: No results found for this or any previous visit.  Sacroiliac Joint Imaging: Sacroiliac Joint DG: No results found for this or any previous visit. Sacroiliac Joint MR w/wo contrast: No results found for this or any previous visit. Sacroiliac Joint MR wo contrast: No results found for this or any previous visit.  Spine Imaging: Whole Spine DG Myelogram views: No results found  for this or any previous visit. Whole Spine MR Mets screen: No results found for this or any previous visit. Whole Spine MR Mets screen: No results found for this or any previous visit. Whole Spine MR w/wo: No results found for this or any previous visit. MRA Spinal Canal w/ cm: No results found for this or any previous visit. MRA Spinal Canal wo/ cm: No results found for this or any previous visit. MRA Spinal Canal w/wo cm: No results found for this or any previous visit. Spine Outside MR Films: No results found for this or any previous visit. Spine Outside CT Films: No results found for this or any previous visit. CT-Guided Biopsy: No results found for this or any previous visit. CT-Guided Needle Placement: No results found for this or any previous visit. DG Spine outside: No results found for this or any previous visit. IR Spine outside: No results found for this or any previous visit. NM Spine outside: No results found for  this or any previous visit.  Hip Imaging: Hip-R MR w contrast: No results found for this or any previous visit. Hip-L MR w contrast: No results found for this or any previous visit. Hip-R MR w/wo contrast: No results found for this or any previous visit. Hip-L MR w/wo contrast: No results found for this or any previous visit. Hip-R MR wo contrast: No results found for this or any previous visit. Hip-L MR wo contrast: No results found for this or any previous visit. Hip-R CT w contrast: No results found for this or any previous visit. Hip-L CT w contrast: No results found for this or any previous visit. Hip-R CT w/wo contrast: No results found for this or any previous visit. Hip-L CT w/wo contrast: No results found for this or any previous visit. Hip-R CT wo contrast: No results found for this or any previous visit. Hip-L CT wo contrast: No results found for this or any previous visit. Hip-R DG 2-3 views: No results found for this or any previous visit. Hip-L DG 2-3 views: No results found for this or any previous visit. Hip-R DG Arthrogram: No results found for this or any previous visit. Hip-L DG Arthrogram: No results found for this or any previous visit. Hip-B DG Bilateral:  Results for orders placed during the hospital encounter of 08/01/15  DG HIPS BILAT WITH PELVIS 2V   Narrative CLINICAL DATA:  Bilateral hip and knee pain since childhood with increased symptoms over the past 6 weeks; no new injury  EXAM: DG HIP (WITH OR WITHOUT PELVIS) 2V BILAT  COMPARISON:  None in PACs  FINDINGS: The bony pelvis is adequately mineralized. There is no lytic or blastic lesion nor evidence of an acute or healing fracture. The sacrum and SI joints are grossly normal.  AP and lateral views of both hips reveal reasonable preservation of the joint spaces. The articular surfaces of the femoral heads and acetabuli remains smoothly rounded. The femoral necks, intertrochanteric, and  subtrochanteric regions appear normal.  IMPRESSION: There is no acute or significant chronic bony abnormality of the pelvis or hips.   Electronically Signed   By: David  Martinique M.D.   On: 08/01/2015 13:39     Knee Imaging: Knee-R MR w contrast: No results found for this or any previous visit. Knee-L MR w contrast: No results found for this or any previous visit. Knee-R MR w/wo contrast: No results found for this or any previous visit. Knee-L MR w/wo contrast: No results found for this or  any previous visit. Knee-R MR wo contrast:  Results for orders placed during the hospital encounter of 09/18/15  MR Knee Right Wo Contrast   Narrative CLINICAL DATA:  Several falls, knee pain  EXAM: MRI OF THE RIGHT KNEE WITHOUT CONTRAST  TECHNIQUE: Multiplanar, multisequence MR imaging of the knee was performed. No intravenous contrast was administered.  COMPARISON:  None.  FINDINGS: MENISCI  Medial meniscus:  Intact.  Lateral meniscus: Small radial tear of the free edge of the body of the lateral meniscus.  LIGAMENTS  Cruciates:  Intact ACL and PCL.  Collaterals: Medial collateral ligament is intact. Lateral collateral ligament complex is intact.  CARTILAGE  Patellofemoral: Full-thickness cartilage loss of the lateral patellofemoral compartment with subchondral reactive marrow changes and marginal osteophytosis. High-grade partial-thickness cartilage loss with areas of full-thickness cartilage loss of the medial patellofemoral compartment.  Medial: Partial-thickness cartilage loss of the medial femorotibial compartment.  Lateral: Chondromalacia of the lateral femorotibial compartment with mild partial-thickness cartilage loss of the lateral tibial plateau.  Joint: Small joint effusion. Mild edema in Hoffa's fat. No plical thickening.  Popliteal Fossa:  No Baker cyst.  Intact popliteus tendon.  Extensor Mechanism:  Intact.  Bones: No other marrow signal  abnormality. No fracture or dislocation.  Other: None  IMPRESSION: 1. Small radial tear of the free edge of the body of the lateral meniscus. 2. Tricompartmental cartilage abnormalities most severe in the patellofemoral compartment as described above.   Electronically Signed   By: Kathreen Devoid   On: 09/18/2015 10:37    Knee-L MR wo contrast: No results found for this or any previous visit. Knee-R CT w contrast: No results found for this or any previous visit. Knee-L CT w contrast: No results found for this or any previous visit. Knee-R CT w/wo contrast: No results found for this or any previous visit. Knee-L CT w/wo contrast: No results found for this or any previous visit. Knee-R CT wo contrast: No results found for this or any previous visit. Knee-L CT wo contrast: No results found for this or any previous visit. Knee-R DG 1-2 views: No results found for this or any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 any prescriptions from our practice. Safely store a copy of your signed Agreement. Violations to the Agreement will result in no further prescriptions. (Additional copies of our Medication Agreement are available upon request.) 9. Laws, Rules, & Regulations: All patients are expected to follow all Federal and [employer], Rules, [employer]. Ignorance of the Laws does not constitute a valid excuse.  Exceptions: There are only two exceptions to the rule of not receiving pain medications from other Healthcare Providers. 1. Exception #1 (Emergencies): In the event of an emergency (i.e.: accident requiring emergency care), you are allowed to receive additional pain medication. However, you are responsible for: As soon as you are able, call our office [phone], at any time of the day or night, and leave a message stating your name, the date and nature of the emergency, and the name and dose of the medication prescribed. In the event that your call is answered by a member of our staff, make sure to document and save the date, time, and the name of the person that took your information.  2. Exception #2 (Planned Surgery): In the event that you are scheduled by another doctor or dentist to have any type of surgery or procedure, you are allowed (for a period no longer than 30 days), to receive additional pain medication, for the acute post-op pain. However, in this case, you are responsible for picking up a copy of our "Post-op Pain Management for Surgeons" handout, and giving it to your surgeon or dentist. This document is available at our office, and does not require an appointment to obtain it. Simply go to our office during business hours (Monday-Thursday from 8:00 AM to 4:00 PM) (Friday 8:00 AM to 12:00 Noon) or if you have a scheduled appointment with [place], prior to your surgery, and ask for it by name. In addition, you will need to provide [place] with your name,  name of your surgeon, type of surgery, and date of procedure or surgery.  _____________________________________________________________________________________________Pain Management Discharge Instructions  General Discharge Instructions :  If you need to reach your doctor call: Monday-Friday 8:00 am - 4:00 pm at [phone] or toll free [phone].  After clinic hours [phone] to have operator reach doctor.  Bring all of your medication bottles to all your appointments in the pain clinic.  To cancel or reschedule your appointment with Pain Management please remember to call 24 hours in advance to avoid a fee.  Refer to the educational materials which you have been given on: General Risks, I had my Procedure. Discharge Instructions, Post Sedation.  Post Procedure Instructions:  The  drugs you were given will stay in your system until tomorrow, so for the next 24 hours you should not drive, make any legal decisions or drink any alcoholic beverages.  You may eat anything you prefer, but it is better to start with liquids then soups and crackers, and gradually work up to solid foods.  Please notify your doctor immediately if you have any unusual bleeding, trouble breathing or pain that is not related to your normal pain.  Depending on the type of procedure that was done, some parts of your body may feel week and/or numb.  This usually clears up by tonight or the next day.  Walk with the use of an assistive device or accompanied by an adult for the 24 hours.  You may use ice on the affected area for the first 24 hours.  Put ice in a Ziploc bag and cover with a towel and place against area 15 minutes on 15 minutes off.  You may switch to heat after 24 hours.GENERAL RISKS AND COMPLICATIONS  What are the risk, side effects and possible complications? Generally speaking, most procedures are safe.  However, with any procedure there are risks, side effects, and the possibility of  complications.  The risks and complications are dependent upon the sites that are lesioned, or the type of nerve block to be performed.  The closer the procedure is to the spine, the more serious the risks are.  Great care is taken when placing the radio frequency needles, block needles or lesioning probes, but sometimes complications can occur. 1. Infection: Any time there is an injection through the skin, there is a risk of infection.  This is why sterile conditions are used for these blocks.  There are four possible types of infection. 1. Localized skin infection. 2. Central Nervous System Infection-This can be in the form of Meningitis, which can be deadly. 3. Epidural Infections-This can be in the form of an epidural abscess, which can cause pressure inside of the spine, causing compression of the spinal cord with subsequent paralysis. This would require an emergency surgery to decompress, and there are no guarantees that the patient would recover from the paralysis. 4. Discitis-This is an infection of the intervertebral discs.  It occurs in about 1% of discography procedures.  It is difficult to treat and it may lead to surgery.        2. Pain: the needles have to go through skin and soft tissues, will cause soreness.       3. Damage to internal structures:  The nerves to be lesioned may be near blood vessels or    other nerves which can be potentially damaged.       4. Bleeding: Bleeding is more common if the patient is taking blood thinners such as  aspirin, Coumadin, Ticiid, Plavix, etc., or if he/she have some genetic predisposition  such as hemophilia. Bleeding into the spinal canal can cause compression of the spinal  cord with subsequent paralysis.  This would require an emergency surgery to  decompress and there are no guarantees that the patient would recover from the  paralysis.       5. Pneumothorax:  Puncturing of a lung is a possibility, every time a needle is introduced in  the area of  the chest or upper back.  Pneumothorax refers to free air around the  collapsed lung(s), inside of the thoracic cavity (chest cavity).  Another two possible  complications related to a similar event would include:  Hemothorax and Chylothorax.   These are variations of the Pneumothorax, where instead of air around the collapsed  lung(s), you may have blood or chyle, respectively.       6. Spinal headaches: They may occur with any procedures in the area of the spine.       7. Persistent CSF (Cerebro-Spinal Fluid) leakage: This is a rare problem, but may occur  with prolonged intrathecal or epidural catheters either due to the formation of a fistulous  track or a dural tear.       8. Nerve damage: By working so close to the spinal cord, there is always a possibility of  nerve damage, which could be as serious as a permanent spinal cord injury with  paralysis.       9. Death:  Although rare, severe deadly allergic reactions known as "Anaphylactic  reaction" can occur to any of the medications used.      10. Worsening of the symptoms:  We can always make thing worse.  What are the chances of something like this happening? Chances of any of this occuring are extremely low.  By statistics, you have more of a chance of getting killed in a motor vehicle accident: while driving to the hospital than any of the above occurring .  Nevertheless, you should be aware that they are possibilities.  In general, it is similar to taking a shower.  Everybody knows that you can slip, hit your head and get killed.  Does that mean that you should not shower again?  Nevertheless always keep in mind that statistics do not mean anything if you happen to be on the wrong side of them.  Even if a procedure has a 1 (one) in a 1,000,000 (million) chance of going wrong, it you happen to be that one..Also, keep in mind that by statistics, you have more of a chance of having something go wrong when taking medications.  Who should not have  this procedure? If you are on a blood thinning medication (e.g. Coumadin, Plavix, see list of "Blood Thinners"), or if you have an active infection going on, you should not have the procedure.  If you are taking any blood thinners, please inform your physician.  How should I prepare for this procedure?  Do not eat or drink anything at least six hours prior to the procedure.  Bring a driver with you .  It cannot be a taxi.  Come accompanied by an adult that can drive you back, and that is strong enough to help you if your legs get weak or numb from the local anesthetic.  Take all of your medicines the morning of the procedure with just enough water to swallow them.  If you have diabetes, make sure that you are scheduled to have your procedure done first thing in the morning, whenever possible.  If you have diabetes, take only half of your insulin dose and notify our nurse that you have done so as soon as you arrive at the clinic.  If you are diabetic, but only take blood sugar pills (oral hypoglycemic), then do not take them on the morning of your procedure.  You may take them after you have had the procedure.  Do not take aspirin or any aspirin-containing medications, at least eleven (11) days prior to the procedure.  They may prolong bleeding.  Wear loose fitting clothing that may be easy to take off and that you would not mind if it got stained with  Betadine or blood.  Do not wear any jewelry or perfume  Remove any nail coloring.  It will interfere with some of our monitoring equipment.  NOTE: Remember that this is not meant to be interpreted as a complete list of all possible complications.  Unforeseen problems may occur.  BLOOD THINNERS The following drugs contain aspirin or other products, which can cause increased bleeding during surgery and should not be taken for 2 weeks prior to and 1 week after surgery.  If you should need take something for relief of minor pain, you may take  acetaminophen which is found in Tylenol,m Datril, Anacin-3 and Panadol. It is not blood thinner. The products listed below are.  Do not take any of the products listed below in addition to any listed on your instruction sheet.  A.P.C or A.P.C with Codeine Codeine Phosphate Capsules #3 Ibuprofen Ridaura  ABC compound Congesprin Imuran rimadil  Advil Cope Indocin Robaxisal  Alka-Seltzer Effervescent Pain Reliever and Antacid Coricidin or Coricidin-D  Indomethacin Rufen  Alka-Seltzer plus Cold Medicine Cosprin Ketoprofen S-A-C Tablets  Anacin Analgesic Tablets or Capsules Coumadin Korlgesic Salflex  Anacin Extra Strength Analgesic tablets or capsules CP-2 Tablets Lanoril Salicylate  Anaprox Cuprimine Capsules Levenox Salocol  Anexsia-D Dalteparin Magan Salsalate  Anodynos Darvon compound Magnesium Salicylate Sine-off  Ansaid Dasin Capsules Magsal Sodium Salicylate  Anturane Depen Capsules Marnal Soma  APF Arthritis pain formula Dewitt's Pills Measurin Stanback  Argesic Dia-Gesic Meclofenamic Sulfinpyrazone  Arthritis Bayer Timed Release Aspirin Diclofenac Meclomen Sulindac  Arthritis pain formula Anacin Dicumarol Medipren Supac  Analgesic (Safety coated) Arthralgen Diffunasal Mefanamic Suprofen  Arthritis Strength Bufferin Dihydrocodeine Mepro Compound Suprol  Arthropan liquid Dopirydamole Methcarbomol with Aspirin Synalgos  ASA tablets/Enseals Disalcid Micrainin Tagament  Ascriptin Doan's Midol Talwin  Ascriptin A/D Dolene Mobidin Tanderil  Ascriptin Extra Strength Dolobid Moblgesic Ticlid  Ascriptin with Codeine Doloprin or Doloprin with Codeine Momentum Tolectin  Asperbuf Duoprin Mono-gesic Trendar  Aspergum Duradyne Motrin or Motrin IB Triminicin  Aspirin plain, buffered or enteric coated Durasal Myochrisine Trigesic  Aspirin Suppositories Easprin Nalfon Trillsate  Aspirin with Codeine Ecotrin Regular or Extra Strength Naprosyn Uracel  Atromid-S Efficin Naproxen Ursinus  Auranofin  Capsules Elmiron Neocylate Vanquish  Axotal Emagrin Norgesic Verin  Azathioprine Empirin or Empirin with Codeine Normiflo Vitamin E  Azolid Emprazil Nuprin Voltaren  Bayer Aspirin plain, buffered or children's or timed BC Tablets or powders Encaprin Orgaran Warfarin Sodium  Buff-a-Comp Enoxaparin Orudis Zorpin  Buff-a-Comp with Codeine Equegesic Os-Cal-Gesic   Buffaprin Excedrin plain, buffered or Extra Strength Oxalid   Bufferin Arthritis Strength Feldene Oxphenbutazone   Bufferin plain or Extra Strength Feldene Capsules Oxycodone with Aspirin   Bufferin with Codeine Fenoprofen Fenoprofen Pabalate or Pabalate-SF   Buffets II Flogesic Panagesic   Buffinol plain or Extra Strength Florinal or Florinal with Codeine Panwarfarin   Buf-Tabs Flurbiprofen Penicillamine   Butalbital Compound Four-way cold tablets Penicillin   Butazolidin Fragmin Pepto-Bismol   Carbenicillin Geminisyn Percodan   Carna Arthritis Reliever Geopen Persantine   Carprofen Gold's salt Persistin   Chloramphenicol Goody's Phenylbutazone   Chloromycetin Haltrain Piroxlcam   Clmetidine heparin Plaquenil   Cllnoril Hyco-pap Ponstel   Clofibrate Hydroxy chloroquine Propoxyphen         Before stopping any of these medications, be sure to consult the physician who ordered them.  Some, such as Coumadin (Warfarin) are ordered to prevent or treat serious conditions such as "deep thrombosis", "pumonary embolisms", and other heart problems.  The amount of time that you may  need off of the medication may also vary with the medication and the reason for which you were taking it.  If you are taking any of these medications, please make sure you notify your pain physician before you undergo any procedures.

## 2016-08-04 NOTE — Progress Notes (Signed)
Safety precautions to be maintained throughout the outpatient stay will include: orient to surroundings, keep bed in low position, maintain call bell within reach at all times, provide assistance with transfer out of bed and ambulation.  

## 2016-08-04 NOTE — Patient Instructions (Addendum)
Pain Score  Introduction: The pain score used by this practice is the Verbal Numerical Rating Scale (VNRS-11). This is an 11-point scale. It is for adults and children 10 years or older. There are significant differences in how the pain score is reported, used, and applied. Forget everything you learned in the past and learn this scoring system.  General Information: The scale should reflect your current level of pain. Unless you are specifically asked for the level of your worst pain, or your average pain. If you are asked for one of these two, then it should be understood that it is over the past 24 hours.  Basic Activities of Daily Living (ADL): Personal hygiene, dressing, eating, transferring, and using restroom.  Instructions: Most patients tend to report their level of pain as a combination of two factors, their physical pain and their psychosocial pain. This last one is also known as "suffering" and it is reflection of how physical pain affects you socially and psychologically. From now on, report them separately. From this point on, when asked to report your pain level, report only your physical pain. Use the following table for reference.  Pain Clinic Pain Levels (0-5/10)  Pain Level Score Description  No Pain 0   Mild pain 1 Nagging, annoying, but does not interfere with basic activities of daily living (ADL). Patients are able to eat, bathe, get dressed, toileting (being able to get on and off the toilet and perform personal hygiene functions), transfer (move in and out of bed or a chair without assistance), and maintain continence (able to control bladder and bowel functions). Blood pressure and heart rate are unaffected. A normal heart rate for a healthy adult ranges from 60 to 100 bpm (beats per minute).   Mild to moderate pain 2 Noticeable and distracting. Impossible to hide from other people. More frequent flare-ups. Still possible to adapt and function close to normal. It can be very  annoying and may have occasional stronger flare-ups. With discipline, patients may get used to it and adapt.   Moderate pain 3 Interferes significantly with activities of daily living (ADL). It becomes difficult to feed, bathe, get dressed, get on and off the toilet or to perform personal hygiene functions. Difficult to get in and out of bed or a chair without assistance. Very distracting. With effort, it can be ignored when deeply involved in activities.   Moderately severe pain 4 Impossible to ignore for more than a few minutes. With effort, patients may still be able to manage work or participate in some social activities. Very difficult to concentrate. Signs of autonomic nervous system discharge are evident: dilated pupils (mydriasis); mild sweating (diaphoresis); sleep interference. Heart rate becomes elevated (>115 bpm). Diastolic blood pressure (lower number) rises above 100 mmHg. Patients find relief in laying down and not moving.   Severe pain 5 Intense and extremely unpleasant. Associated with frowning face and frequent crying. Pain overwhelms the senses.  Ability to do any activity or maintain social relationships becomes significantly limited. Conversation becomes difficult. Pacing back and forth is common, as getting into a comfortable position is nearly impossible. Pain wakes you up from deep sleep. Physical signs will be obvious: pupillary dilation; increased sweating; goosebumps; brisk reflexes; cold, clammy hands and feet; nausea, vomiting or dry heaves; loss of appetite; significant sleep disturbance with inability to fall asleep or to remain asleep. When persistent, significant weight loss is observed due to the complete loss of appetite and sleep deprivation.  Blood pressure and heart   rate becomes significantly elevated. Caution: If elevated blood pressure triggers a pounding headache associated with blurred vision, then the patient should immediately seek attention at an urgent or  emergency care unit, as these may be signs of an impending stroke.    Emergency Department Pain Levels (6-10/10)  Emergency Room Pain 6 Severely limiting. Requires emergency care and should not be seen or managed at an outpatient pain management facility. Communication becomes difficult and requires great effort. Assistance to reach the emergency department may be required. Facial flushing and profuse sweating along with potentially dangerous increases in heart rate and blood pressure will be evident.   Distressing pain 7 Self-care is very difficult. Assistance is required to transport, or use restroom. Assistance to reach the emergency department will be required. Tasks requiring coordination, such as bathing and getting dressed become very difficult.   Disabling pain 8 Self-care is no longer possible. At this level, pain is disabling. The individual is unable to do even the most "basic" activities such as walking, eating, bathing, dressing, transferring to a bed, or toileting. Fine motor skills are lost. It is difficult to think clearly.   Incapacitating pain 9 Pain becomes incapacitating. Thought processing is no longer possible. Difficult to remember your own name. Control of movement and coordination are lost.   The worst pain imaginable 10 At this level, most patients pass out from pain. When this level is reached, collapse of the autonomic nervous system occurs, leading to a sudden drop in blood pressure and heart rate. This in turn results in a temporary and dramatic drop in blood flow to the brain, leading to a loss of consciousness. Fainting is one of the body's self defense mechanisms. Passing out puts the brain in a calmed state and causes it to shut down for a while, in order to begin the healing process.    Summary: 1. Refer to this scale when providing [place] with your pain level. 2. Be accurate and careful when reporting your pain level. This will help with your care. 3. Over-reporting  your pain level will lead to loss of credibility. 4. Even a level of 1/10 means that there is pain and will be treated at our facility. 5. High, inaccurate reporting will be documented as "Symptom Exaggeration", leading to loss of credibility and suspicions of possible secondary gains such as obtaining more narcotics, or wanting to appear disabled, for fraudulent reasons. 6. Only pain levels of 5 or below will be seen at our facility. 7. Pain levels of 6 and above will be sent to the Emergency Department and the appointment cancelled. _____________________________________________________________________________________________  ____________________________________________________________________________________________  Medication Rules  Applies to: All patients receiving prescriptions (written or electronic).  Pharmacy of record: Pharmacy where electronic prescriptions will be sent. If written prescriptions are taken to a different pharmacy, please inform the nursing staff. The pharmacy listed in the electronic medical record should be the one where you would like electronic prescriptions to be sent.  Prescription refills: Only during scheduled appointments. Applies to both, written and electronic prescriptions.  NOTE: The following applies primarily to controlled substances (Opioid Pain Medications)  Patient's responsibilities: 1. Pain Pills: Bring all pain pills to every appointment (except for procedure appointments). 2. Pill Bottles: Bring pills in original pharmacy bottle. Always bring newest bottle. Bring bottle, even if empty. 3. Medication refills: You are responsible for knowing and keeping track of what medications you need refilled. The day before your appointment, write a list of all prescriptions that need to be  refilled. Bring that list to your appointment and give it to the admitting nurse. Prescriptions will be written only during appointments. If you forget a medication, it  will not be "Called in", "Faxed", or "electronically sent". You will need to get another appointment to get these prescribed. 4. Prescription Accuracy: You are responsible for carefully inspecting your prescriptions before leaving our office. Have the discharge nurse carefully go over each prescription with you, before taking them home. Make sure that your name is accurately spelled, that your address is correct. Check the name and dose of your medication to make sure it is accurate. Check the number of pills, and the written instructions to make sure they are clear and accurate. Make sure that you are given enough medication to last until your next medication refill appointment. 5. Taking Medication: Take medication as prescribed. Never take more pills than instructed. Never take medication more frequently than prescribed. Taking less pills or less frequently is permitted and encouraged, when it comes to controlled substances (written prescriptions).  6. Inform other Doctors: Always inform, all of your healthcare providers, of all the medications you take. 7. Pain Medication from other Providers: You are not allowed to accept any additional pain medication from any other Doctor or Healthcare provider. There are two exceptions to this rule. (see below) In the event that you require additional pain medication, you are responsible for notifying us, as stated below. 8. Medication Agreement: You are responsible for carefully reading and following our Medication Agreement. This must be signed before receiving any prescriptions from our practice. Safely store a copy of your signed Agreement. Violations to the Agreement will result in no further prescriptions. (Additional copies of our Medication Agreement are available upon request.) 9. Laws, Rules, & Regulations: All patients are expected to follow all Federal and [employer], Rules, [employer]. Ignorance of the Laws does not constitute a valid  excuse.  Exceptions: There are only two exceptions to the rule of not receiving pain medications from other Healthcare Providers. 1. Exception #1 (Emergencies): In the event of an emergency (i.e.: accident requiring emergency care), you are allowed to receive additional pain medication. However, you are responsible for: As soon as you are able, call our office [phone], at any time of the day or night, and leave a message stating your name, the date and nature of the emergency, and the name and dose of the medication prescribed. In the event that your call is answered by a member of our staff, make sure to document and save the date, time, and the name of the person that took your information.  2. Exception #2 (Planned Surgery): In the event that you are scheduled by another doctor or dentist to have any type of surgery or procedure, you are allowed (for a period no longer than 30 days), to receive additional pain medication, for the acute post-op pain. However, in this case, you are responsible for picking up a copy of our "Post-op Pain Management for Surgeons" handout, and giving it to your surgeon or dentist. This document is available at our office, and does not require an appointment to obtain it. Simply go to our office during business hours (Monday-Thursday from 8:00 AM to 4:00 PM) (Friday 8:00 AM to 12:00 Noon) or if you have a scheduled appointment with [place], prior to your surgery, and ask for it by name. In addition, you will need to provide [place] with your name, name of your surgeon, type of surgery, and  date of procedure or surgery.  _____________________________________________________________________________________________Pain Management Discharge Instructions  General Discharge Instructions :  If you need to reach your doctor call: Monday-Friday 8:00 am - 4:00 pm at [phone] or toll free [phone].  After clinic hours [phone] to have operator reach doctor.  Bring all of  your medication bottles to all your appointments in the pain clinic.  To cancel or reschedule your appointment with Pain Management please remember to call 24 hours in advance to avoid a fee.  Refer to the educational materials which you have been given on: General Risks, I had my Procedure. Discharge Instructions, Post Sedation.  Post Procedure Instructions:  The drugs you were given will stay in your system until tomorrow, so for the next 24 hours you should not drive, make any legal decisions or drink any alcoholic beverages.  You may eat anything you prefer, but it is better to start with liquids then soups and crackers, and gradually work up to solid foods.  Please notify your doctor immediately if you have any unusual bleeding, trouble breathing or pain that is not related to your normal pain.  Depending on the type of procedure that was done, some parts of your body may feel week and/or numb.  This usually clears up by tonight or the next day.  Walk with the use of an assistive device or accompanied by an adult for the 24 hours.  You may use ice on the affected area for the first 24 hours.  Put ice in a Ziploc bag and cover with a towel and place against area 15 minutes on 15 minutes off.  You may switch to heat after 24 hours.GENERAL RISKS AND COMPLICATIONS  What are the risk, side effects and possible complications? Generally speaking, most procedures are safe.  However, with any procedure there are risks, side effects, and the possibility of complications.  The risks and complications are dependent upon the sites that are lesioned, or the type of nerve block to be performed.  The closer the procedure is to the spine, the more serious the risks are.  Great care is taken when placing the radio frequency needles, block needles or lesioning probes, but sometimes complications can occur. 1. Infection: Any time there is an injection through the skin, there is a risk of infection.  This is  why sterile conditions are used for these blocks.  There are four possible types of infection. 1. Localized skin infection. 2. Central Nervous System Infection-This can be in the form of Meningitis, which can be deadly. 3. Epidural Infections-This can be in the form of an epidural abscess, which can cause pressure inside of the spine, causing compression of the spinal cord with subsequent paralysis. This would require an emergency surgery to decompress, and there are no guarantees that the patient would recover from the paralysis. 4. Discitis-This is an infection of the intervertebral discs.  It occurs in about 1% of discography procedures.  It is difficult to treat and it may lead to surgery.        2. Pain: the needles have to go through skin and soft tissues, will cause soreness.       3. Damage to internal structures:  The nerves to be lesioned may be near blood vessels or    other nerves which can be potentially damaged.       4. Bleeding: Bleeding is more common if the patient is taking blood thinners such as  aspirin, Coumadin, Ticiid, Plavix, etc., or if he/she  have some genetic predisposition  such as hemophilia. Bleeding into the spinal canal can cause compression of the spinal  cord with subsequent paralysis.  This would require an emergency surgery to  decompress and there are no guarantees that the patient would recover from the  paralysis.       5. Pneumothorax:  Puncturing of a lung is a possibility, every time a needle is introduced in  the area of the chest or upper back.  Pneumothorax refers to free air around the  collapsed lung(s), inside of the thoracic cavity (chest cavity).  Another two possible  complications related to a similar event would include: Hemothorax and Chylothorax.   These are variations of the Pneumothorax, where instead of air around the collapsed  lung(s), you may have blood or chyle, respectively.       6. Spinal headaches: They may occur with any procedures in the  area of the spine.       7. Persistent CSF (Cerebro-Spinal Fluid) leakage: This is a rare problem, but may occur  with prolonged intrathecal or epidural catheters either due to the formation of a fistulous  track or a dural tear.       8. Nerve damage: By working so close to the spinal cord, there is always a possibility of  nerve damage, which could be as serious as a permanent spinal cord injury with  paralysis.       9. Death:  Although rare, severe deadly allergic reactions known as "Anaphylactic  reaction" can occur to any of the medications used.      10. Worsening of the symptoms:  We can always make thing worse.  What are the chances of something like this happening? Chances of any of this occuring are extremely low.  By statistics, you have more of a chance of getting killed in a motor vehicle accident: while driving to the hospital than any of the above occurring .  Nevertheless, you should be aware that they are possibilities.  In general, it is similar to taking a shower.  Everybody knows that you can slip, hit your head and get killed.  Does that mean that you should not shower again?  Nevertheless always keep in mind that statistics do not mean anything if you happen to be on the wrong side of them.  Even if a procedure has a 1 (one) in a 1,000,000 (million) chance of going wrong, it you happen to be that one..Also, keep in mind that by statistics, you have more of a chance of having something go wrong when taking medications.  Who should not have this procedure? If you are on a blood thinning medication (e.g. Coumadin, Plavix, see list of "Blood Thinners"), or if you have an active infection going on, you should not have the procedure.  If you are taking any blood thinners, please inform your physician.  How should I prepare for this procedure?  Do not eat or drink anything at least six hours prior to the procedure.  Bring a driver with you .  It cannot be a taxi.  Come accompanied  by an adult that can drive you back, and that is strong enough to help you if your legs get weak or numb from the local anesthetic.  Take all of your medicines the morning of the procedure with just enough water to swallow them.  If you have diabetes, make sure that you are scheduled to have your procedure done first thing in the morning,  whenever possible.  If you have diabetes, take only half of your insulin dose and notify our nurse that you have done so as soon as you arrive at the clinic.  If you are diabetic, but only take blood sugar pills (oral hypoglycemic), then do not take them on the morning of your procedure.  You may take them after you have had the procedure.  Do not take aspirin or any aspirin-containing medications, at least eleven (11) days prior to the procedure.  They may prolong bleeding.  Wear loose fitting clothing that may be easy to take off and that you would not mind if it got stained with Betadine or blood.  Do not wear any jewelry or perfume  Remove any nail coloring.  It will interfere with some of our monitoring equipment.  NOTE: Remember that this is not meant to be interpreted as a complete list of all possible complications.  Unforeseen problems may occur.  BLOOD THINNERS The following drugs contain aspirin or other products, which can cause increased bleeding during surgery and should not be taken for 2 weeks prior to and 1 week after surgery.  If you should need take something for relief of minor pain, you may take acetaminophen which is found in Tylenol,m Datril, Anacin-3 and Panadol. It is not blood thinner. The products listed below are.  Do not take any of the products listed below in addition to any listed on your instruction sheet.  A.P.C or A.P.C with Codeine Codeine Phosphate Capsules #3 Ibuprofen Ridaura  ABC compound Congesprin Imuran rimadil  Advil Cope Indocin Robaxisal  Alka-Seltzer Effervescent Pain Reliever and Antacid Coricidin or  Coricidin-D  Indomethacin Rufen  Alka-Seltzer plus Cold Medicine Cosprin Ketoprofen S-A-C Tablets  Anacin Analgesic Tablets or Capsules Coumadin Korlgesic Salflex  Anacin Extra Strength Analgesic tablets or capsules CP-2 Tablets Lanoril Salicylate  Anaprox Cuprimine Capsules Levenox Salocol  Anexsia-D Dalteparin Magan Salsalate  Anodynos Darvon compound Magnesium Salicylate Sine-off  Ansaid Dasin Capsules Magsal Sodium Salicylate  Anturane Depen Capsules Marnal Soma  APF Arthritis pain formula Dewitt's Pills Measurin Stanback  Argesic Dia-Gesic Meclofenamic Sulfinpyrazone  Arthritis Bayer Timed Release Aspirin Diclofenac Meclomen Sulindac  Arthritis pain formula Anacin Dicumarol Medipren Supac  Analgesic (Safety coated) Arthralgen Diffunasal Mefanamic Suprofen  Arthritis Strength Bufferin Dihydrocodeine Mepro Compound Suprol  Arthropan liquid Dopirydamole Methcarbomol with Aspirin Synalgos  ASA tablets/Enseals Disalcid Micrainin Tagament  Ascriptin Doan's Midol Talwin  Ascriptin A/D Dolene Mobidin Tanderil  Ascriptin Extra Strength Dolobid Moblgesic Ticlid  Ascriptin with Codeine Doloprin or Doloprin with Codeine Momentum Tolectin  Asperbuf Duoprin Mono-gesic Trendar  Aspergum Duradyne Motrin or Motrin IB Triminicin  Aspirin plain, buffered or enteric coated Durasal Myochrisine Trigesic  Aspirin Suppositories Easprin Nalfon Trillsate  Aspirin with Codeine Ecotrin Regular or Extra Strength Naprosyn Uracel  Atromid-S Efficin Naproxen Ursinus  Auranofin Capsules Elmiron Neocylate Vanquish  Axotal Emagrin Norgesic Verin  Azathioprine Empirin or Empirin with Codeine Normiflo Vitamin E  Azolid Emprazil Nuprin Voltaren  Bayer Aspirin plain, buffered or children's or timed BC Tablets or powders Encaprin Orgaran Warfarin Sodium  Buff-a-Comp Enoxaparin Orudis Zorpin  Buff-a-Comp with Codeine Equegesic Os-Cal-Gesic   Buffaprin Excedrin plain, buffered or Extra Strength Oxalid   Bufferin  Arthritis Strength Feldene Oxphenbutazone   Bufferin plain or Extra Strength Feldene Capsules Oxycodone with Aspirin   Bufferin with Codeine Fenoprofen Fenoprofen Pabalate or Pabalate-SF   Buffets II Flogesic Panagesic   Buffinol plain or Extra Strength Florinal or Florinal with Codeine Panwarfarin   Buf-Tabs Flurbiprofen Penicillamine  Butalbital Compound Four-way cold tablets Penicillin   Butazolidin Fragmin Pepto-Bismol   Carbenicillin Geminisyn Percodan   Carna Arthritis Reliever Geopen Persantine   Carprofen Gold's salt Persistin   Chloramphenicol Goody's Phenylbutazone   Chloromycetin Haltrain Piroxlcam   Clmetidine heparin Plaquenil   Cllnoril Hyco-pap Ponstel   Clofibrate Hydroxy chloroquine Propoxyphen         Before stopping any of these medications, be sure to consult the physician who ordered them.  Some, such as Coumadin (Warfarin) are ordered to prevent or treat serious conditions such as "deep thrombosis", "pumonary embolisms", and other heart problems.  The amount of time that you may need off of the medication may also vary with the medication and the reason for which you were taking it.  If you are taking any of these medications, please make sure you notify your pain physician before you undergo any procedures.

## 2016-08-06 NOTE — Progress Notes (Signed)
Results were reviewed and found to be: significantly abnormal  Further testing may be useful  Review would suggest interventional pain management techniques may be of benefit

## 2016-08-07 LAB — 25-HYDROXY VITAMIN D LCMS D2+D3
25-Hydroxy, Vitamin D-2: 2.1 ng/mL
25-Hydroxy, Vitamin D-3: 30 ng/mL
25-Hydroxy, Vitamin D: 32 ng/mL

## 2016-08-26 NOTE — Progress Notes (Addendum)
Patient's Name: Cathy Mitchell  MRN: [id number]  Referring Provider: Kathrine Haddock, NP  DOB: 11/23/1965  PCP: Kathrine Haddock, NP  DOS: 08/27/2016  Note by: Kathlen Brunswick. Dossie Arbour, MD  Service setting: Ambulatory outpatient  Specialty: Interventional Pain Management  Location: [facility] (AMB) Pain Management Facility    Patient type: Established   Primary Reason(s) for Visit: Encounter for evaluation before starting new chronic pain management plan of care (Level of risk: moderate) CC: Neck Pain and Knee Pain (both, worse in right)  HPI  Cathy Mitchell is a 51 y.o. year old, female patient, who comes today for a follow-up evaluation to review the test results and decide on a treatment plan. She has Primary generalized hypertrophic osteoarthrosis; Obstructive sleep apnea; Chronic pain syndrome; Irritable bowel syndrome; Tobacco abuse; Acute anxiety; Fibromyalgia; Sleep apnea; Extreme obesity; Chronic kidney disease, stage 1, normal or increased GFR; Numbness; Rosacea; SOB (shortness of breath); Wheat allergy; Chronic hip pain (Location of Tertiary source of pain) (Right); Pain management; Chronic knee pain (Right); Eczema of right hand; Tricompartment osteoarthritis of knee (Right); Osteoarthritis of knee (Right); Osteoarthritis; Long term (current) use of opiate analgesic; Long term prescription opiate use; Opiate use; Chronic knee pain (Location of Primary Source of Pain) (Bilateral) (R>L); Osteoarthritis of knee (Bilateral) (R>L); Chronic neck pain (Location of Secondary source of pain) (Bilateral); Osteoarthritis of hip (Right); Occipital headaches (Bilateral) (L>R); and Cervicogenic headache (Bilateral) (L>R) on her problem list. Her primarily concern today is the Neck Pain and Knee Pain (both, worse in right)  Pain Assessment: Self-Reported Pain Score: 3 /10             Reported level is compatible with observation.       Pain Type: Chronic pain Pain Location: Neck Pain Descriptors / Indicators:  Stabbing Pain Frequency: Constant  Cathy Mitchell comes in today for a follow-up visit after her initial evaluation on 08/04/2016. Today we went over the results of her tests. These were explained in "Layman's terms". During today's appointment we went over my diagnostic impression, as well as the proposed treatment plan.  In considering the treatment plan options, Cathy Mitchell was reminded that I no longer take patients for medication management only. I asked her to let me know if she had no intention of taking advantage of the interventional therapies, so that we could make arrangements to provide this space to someone interested. I also made it clear that undergoing interventional therapies for the purpose of getting pain medications is very inappropriate on the part of a patient, and it will not be tolerated in this practice. This type of behavior would suggest true addiction and therefore it requires referral to an addiction specialist.   Further details on both, my assessment(s), as well as the proposed treatment plan, please see below. Controlled Substance Pharmacotherapy Assessment REMS (Risk Evaluation and Mitigation Strategy)  Analgesic: None Highest recorded MME/day: 40 mg/day MME/day: 0 mg/day Pill Count: None expected due to no prior prescriptions written by our practice. Pharmacokinetics: N/A Pharmacodynamics: N/A Monitoring: N/A  Pharmacologic Plan: Cathy Mitchell is not a candidate for opioid therapy at this time  Laboratory Chemistry  Inflammation Markers Lab Results  Component Value Date   CRP <0.8 08/04/2016   ESRSEDRATE 15 08/04/2016   (CRP: Acute Phase) (ESR: Chronic Phase) Renal Function Markers Lab Results  Component Value Date   BUN 19 07/31/2016   CREATININE 0.70 07/31/2016   GFRAA 118 07/31/2016   GFRNONAA 102 07/31/2016   Hepatic Function Markers Lab Results  Component Value Date   AST 17 07/31/2016   ALT 17 07/31/2016   ALBUMIN 4.6 07/31/2016   ALKPHOS 84  07/31/2016   Electrolytes Lab Results  Component Value Date   NA 139 07/31/2016   K 4.5 07/31/2016   CL 98 07/31/2016   CALCIUM 9.6 07/31/2016   MG 2.2 08/04/2016   Neuropathy Markers Lab Results  Component Value Date   VITAMINB12 457 08/04/2016   Bone Pathology Markers Lab Results  Component Value Date   ALKPHOS 84 07/31/2016   25OHVITD1 32 08/04/2016   25OHVITD2 2.1 08/04/2016   25OHVITD3 30 08/04/2016   CALCIUM 9.6 07/31/2016   Coagulation Parameters Lab Results  Component Value Date   INR 0.97 12/03/2014   LABPROT 13.1 12/03/2014   APTT 32 12/03/2014   PLT 346 07/31/2016   Cardiovascular Markers Lab Results  Component Value Date   HGB 14.5 12/03/2014   HCT 39.8 07/31/2016   Note: Lab results reviewed.  Recent Diagnostic Imaging Review  Dg Cervical Spine Complete Result Date: 08/04/2016 CLINICAL DATA:  Many years of neck pain. Possible previous cervical fusion. EXAM: CERVICAL SPINE - COMPLETE 4+ VIEW COMPARISON:  None in PACs FINDINGS: The cervical vertebral bodies are preserved in height. There is mild degenerative disc space narrowing at C4-5 and C5-6 with anterior and posterior endplate osteophytes noted at these levels. There is no perched facet or spinous process fracture. The oblique views rib reveal mild bilateral bony encroachment upon the neural foramina in the lower cervical spine bilaterally. The odontoid is intact. The prevertebral soft tissue spaces are normal. IMPRESSION: Degenerative disc disease centered at C4-5 and C5-6. No compression fracture or spondylolisthesis. Mild bony encroachment upon the neural foramina bilaterally at multiple lower cervical levels. Electronically Signed   By: David  Martinique M.D.   On: 08/04/2016 15:09   Cervical Imaging: Cervical DG complete:  Results for orders placed during the hospital encounter of 08/04/16  DG Cervical Spine Complete   Narrative CLINICAL DATA:  Many years of neck pain. Possible previous  cervical fusion.  EXAM: CERVICAL SPINE - COMPLETE 4+ VIEW  COMPARISON:  None in PACs  FINDINGS: The cervical vertebral bodies are preserved in height. There is mild degenerative disc space narrowing at C4-5 and C5-6 with anterior and posterior endplate osteophytes noted at these levels. There is no perched facet or spinous process fracture. The oblique views rib reveal mild bilateral bony encroachment upon the neural foramina in the lower cervical spine bilaterally. The odontoid is intact. The prevertebral soft tissue spaces are normal.  IMPRESSION: Degenerative disc disease centered at C4-5 and C5-6. No compression fracture or spondylolisthesis. Mild bony encroachment upon the neural foramina bilaterally at multiple lower cervical levels.   Electronically Signed   By: David  Martinique M.D.   On: 08/04/2016 15:09    Hip Imaging: Hip-B DG Bilateral:  Results for orders placed during the hospital encounter of 08/01/15  DG HIPS BILAT WITH PELVIS 2V   Narrative CLINICAL DATA:  Bilateral hip and knee pain since childhood with increased symptoms over the past 6 weeks; no new injury  EXAM: DG HIP (WITH OR WITHOUT PELVIS) 2V BILAT  COMPARISON:  None in PACs  FINDINGS: The bony pelvis is adequately mineralized. There is no lytic or blastic lesion nor evidence of an acute or healing fracture. The sacrum and SI joints are grossly normal.  AP and lateral views of both hips reveal reasonable preservation of the joint spaces. The articular surfaces of the femoral heads and acetabuli  remains smoothly rounded. The femoral necks, intertrochanteric, and subtrochanteric regions appear normal.  IMPRESSION: There is no acute or significant chronic bony abnormality of the pelvis or hips.   Electronically Signed   By: David  Martinique M.D.   On: 08/01/2015 13:39    Knee Imaging: Knee-R MR wo contrast:  Results for orders placed during the hospital encounter of 09/18/15  MR Knee  Right Wo Contrast   Narrative CLINICAL DATA:  Several falls, knee pain  EXAM: MRI OF THE RIGHT KNEE WITHOUT CONTRAST  TECHNIQUE: Multiplanar, multisequence MR imaging of the knee was performed. No intravenous contrast was administered.  COMPARISON:  None.  FINDINGS: MENISCI  Medial meniscus:  Intact.  Lateral meniscus: Small radial tear of the free edge of the body of the lateral meniscus.  LIGAMENTS  Cruciates:  Intact ACL and PCL.  Collaterals: Medial collateral ligament is intact. Lateral collateral ligament complex is intact.  CARTILAGE  Patellofemoral: Full-thickness cartilage loss of the lateral patellofemoral compartment with subchondral reactive marrow changes and marginal osteophytosis. High-grade partial-thickness cartilage loss with areas of full-thickness cartilage loss of the medial patellofemoral compartment.  Medial: Partial-thickness cartilage loss of the medial femorotibial compartment.  Lateral: Chondromalacia of the lateral femorotibial compartment with mild partial-thickness cartilage loss of the lateral tibial plateau.  Joint: Small joint effusion. Mild edema in Hoffa's fat. No plical thickening.  Popliteal Fossa:  No Baker cyst.  Intact popliteus tendon.  Extensor Mechanism:  Intact.  Bones: No other marrow signal abnormality. No fracture or dislocation.  Other: None  IMPRESSION: 1. Small radial tear of the free edge of the body of the lateral meniscus. 2. Tricompartmental cartilage abnormalities most severe in the patellofemoral compartment as described above.   Electronically Signed   By: Kathreen Devoid   On: 09/18/2015 10:37    Knee-R DG 4 views:  Results for orders placed during the hospital encounter of 08/01/15  DG Knee Complete 4 Views Right   Narrative CLINICAL DATA:  Bilateral knee and hip pain.  EXAM: RIGHT KNEE - COMPLETE 4+ VIEW  COMPARISON:  No recent prior.  FINDINGS: Tricompartment degenerative change  noted. Small loose bodies cannot be excluded. No evidence of fracture or dislocation. No prominent effusion.  IMPRESSION: Diffuse tricompartment degenerative change. Small loose bodies cannot be excluded. No acute bony or joint abnormality identified.   Electronically Signed   By: Marcello Moores  Register   On: 08/01/2015 13:37    Knee-L DG 4 views:  Results for orders placed during the hospital encounter of 08/01/15  DG Knee Complete 4 Views Left   Narrative CLINICAL DATA:  Chronic bilateral knee pain ; arthritis since childhood with increased symptoms over the past 6 weeks, history of fibromyalgia  EXAM: LEFT KNEE - COMPLETE 4+ VIEW  COMPARISON:  None in PACs  FINDINGS: The bones of the left knee are adequately mineralized. The medial and lateral joint compartments appear adequately maintained. There is beaking of the tibial spines. Small spurs arise from the periphery of the articular margins of the femoral condyles and lateral tibial plateau. There is spurring of the articular margins of the patella.  IMPRESSION: Mild osteoarthritic change involving all 3 joint compartments. No high-grade joint space loss is observed. There is no acute bony abnormality.   Electronically Signed   By: David  Martinique M.D.   On: 08/01/2015 13:38    Note: Results of ordered imaging test(s) reviewed and explained to patient in Layman's terms. Copy of results provided to patient  Meds  The patient  has a current medication list which includes the following prescription(s): acetaminophen, calcium citrate-vitamin d, cholecalciferol, clindamycin-benzoyl per (refr), ferrous sulfate, multiple vitamins/womens, vitamin b-12, vitamin c, and zinc.  Current Outpatient Prescriptions on File Prior to Visit  Medication Sig  . acetaminophen (TYLENOL) 325 MG tablet Take by mouth.  . calcium citrate-vitamin D (CITRACAL+D) 315-200 MG-UNIT tablet Take 1 tablet by mouth daily.  . cholecalciferol (VITAMIN D)  1000 units tablet Take 1,000 Units by mouth daily.  . Clindamycin-Benzoyl Per, Refr, gel Apply topically daily.   . Ferrous Sulfate (IRON SUPPLEMENT PO) Take 1 tablet by mouth every other day.  . Multiple Vitamins-Minerals (MULTIPLE VITAMINS/WOMENS) tablet Take 1 tablet by mouth daily.  . vitamin B-12 (CYANOCOBALAMIN) 500 MCG tablet Take 500 mcg by mouth daily.  . vitamin C (ASCORBIC ACID) 500 MG tablet Take 500 mg by mouth daily.  . Zinc 50 MG TABS Take 1 tablet by mouth daily.   No current facility-administered medications on file prior to visit.    ROS  Constitutional: Denies any fever or chills Gastrointestinal: No reported hemesis, hematochezia, vomiting, or acute GI distress Musculoskeletal: Denies any acute onset joint swelling, redness, loss of ROM, or weakness Neurological: No reported episodes of acute onset apraxia, aphasia, dysarthria, agnosia, amnesia, paralysis, loss of coordination, or loss of consciousness  Allergies  Cathy Mitchell is allergic to nsaids; other; and percocet [oxycodone-acetaminophen].  PFSH  Drug: Cathy Mitchell  reports that she does not use drugs. Alcohol:  reports that she does not drink alcohol. Tobacco:  reports that she quit smoking about 18 months ago. Her smoking use included Cigarettes. She has a 14.50 pack-year smoking history. She has never used smokeless tobacco. Medical:  has a past medical history of Anxiety; Arthritis; Fibromyalgia; Hypertension; Neuromuscular disorder ([place]); and Sleep apnea. Family: family history includes COPD in her father; Diabetes in her paternal grandfather; Eczema in her son; Heart disease in her maternal grandfather and paternal grandfather; Thyroid disease in her daughter.  Past Surgical History:  Procedure Laterality Date  . DILATION AND CURETTAGE OF UTERUS    . GANGLION CYST EXCISION Bilateral   . GANGLION CYST EXCISION Right 10/08/2015   Procedure: REMOVAL GANGLION OF WRIST;  Surgeon: Hessie Knows, MD;  Location: [facility]  ORS;  Service: Orthopedics;  Laterality: Right;  . GASTRIC BYPASS    . HAND SURGERY Bilateral Resection  . TUBAL LIGATION     Constitutional Exam  General appearance: Well nourished, well developed, and well hydrated. In no apparent acute distress Vitals:   08/27/16 0820  BP: (!) 147/85  Pulse: 70  Resp: 18  Temp: 98 F (36.7 C)  TempSrc: Oral  SpO2: 99%  Weight: 273 lb (123.8 kg)  Height: 5' 8"  (1.727 m)   BMI Assessment: Estimated body mass index is 41.51 kg/m as calculated from the following:   Height as of this encounter: 5' 8"  (1.727 m).   Weight as of this encounter: 273 lb (123.8 kg).  BMI interpretation table: BMI level Category Range association with higher incidence of chronic pain  <18 kg/m2 Underweight   18.5-24.9 kg/m2 Ideal body weight   25-29.9 kg/m2 Overweight Increased incidence by 20%  30-34.9 kg/m2 Obese (Class I) Increased incidence by 68%  35-39.9 kg/m2 Severe obesity (Class II) Increased incidence by 136%  >40 kg/m2 Extreme obesity (Class III) Increased incidence by 254%   BMI Readings from Last 4 Encounters:  08/27/16 41.51 kg/m  08/04/16 41.51 kg/m  07/31/16 42.65 kg/m  07/17/16 42.06 kg/m   Wt  Readings from Last 4 Encounters:  08/27/16 273 lb (123.8 kg)  08/04/16 273 lb (123.8 kg)  07/31/16 272 lb 4.8 oz (123.5 kg)  07/17/16 276 lb 9.6 oz (125.5 kg)  Psych/Mental status: Alert, oriented x 3 (person, place, & time)       Eyes: PERLA Respiratory: No evidence of acute respiratory distress  Cervical Spine Exam  Inspection: No masses, redness, or swelling Alignment: Symmetrical Functional ROM: Unrestricted ROM      Stability: No instability detected Muscle strength & Tone: Functionally intact Sensory: Unimpaired Palpation: No palpable anomalies              Upper Extremity (UE) Exam    Side: Right upper extremity  Side: Left upper extremity  Inspection: No masses, redness, swelling, or asymmetry. No contractures  Inspection: No  masses, redness, swelling, or asymmetry. No contractures  Functional ROM: Unrestricted ROM          Functional ROM: Unrestricted ROM          Muscle strength & Tone: Functionally intact  Muscle strength & Tone: Functionally intact  Sensory: Unimpaired  Sensory: Unimpaired  Palpation: No palpable anomalies              Palpation: No palpable anomalies              Specialized Test(s): Deferred         Specialized Test(s): Deferred          Thoracic Spine Exam  Inspection: No masses, redness, or swelling Alignment: Symmetrical Functional ROM: Unrestricted ROM Stability: No instability detected Sensory: Unimpaired Muscle strength & Tone: No palpable anomalies  Lumbar Spine Exam  Inspection: No masses, redness, or swelling Alignment: Symmetrical Functional ROM: Unrestricted ROM      Stability: No instability detected Muscle strength & Tone: Functionally intact Sensory: Unimpaired Palpation: No palpable anomalies       Provocative Tests: Lumbar Hyperextension and rotation test: evaluation deferred today       Patrick's Maneuver: evaluation deferred today                    Gait & Posture Assessment  Ambulation: Unassisted Gait: Relatively normal for age and body habitus Posture: Antalgic   Lower Extremity Exam    Side: Right lower extremity  Side: Left lower extremity  Inspection: No masses, redness, swelling, or asymmetry. No contractures  Inspection: No masses, redness, swelling, or asymmetry. No contractures  Functional ROM: Unrestricted ROM          Functional ROM: Unrestricted ROM          Muscle strength & Tone: Functionally intact  Muscle strength & Tone: Functionally intact  Sensory: Unimpaired  Sensory: Unimpaired  Palpation: No palpable anomalies  Palpation: No palpable anomalies   Assessment & Plan  Primary Diagnosis & Pertinent Problem List: The primary encounter diagnosis was Chronic knee pain (Location of Primary Source of Pain) (Bilateral) (R>L). Diagnoses of  Chronic neck pain (Location of Secondary source of pain) (Bilateral), Chronic hip pain (Location of Tertiary source of pain) (Right), Osteoarthritis of knee (Bilateral) (R>L), Osteoarthritis of knee (Right), Tricompartment osteoarthritis of knee (Right), Occipital headaches (Bilateral) (L>R), and Cervicogenic headache (Bilateral) (L>R) were also pertinent to this visit.  Visit Diagnosis: 1. Chronic knee pain (Location of Primary Source of Pain) (Bilateral) (R>L)   2. Chronic neck pain (Location of Secondary source of pain) (Bilateral)   3. Chronic hip pain (Location of Tertiary source of pain) (Right)   4. Osteoarthritis of  knee (Bilateral) (R>L)   5. Osteoarthritis of knee (Right)   6. Tricompartment osteoarthritis of knee (Right)   7. Occipital headaches (Bilateral) (L>R)   8. Cervicogenic headache (Bilateral) (L>R)    Problems updated and reviewed during this visit: Problem  Occipital headaches (Bilateral) (L>R)  Cervicogenic headache (Bilateral) (L>R)   Problem-specific Plan(s): No problem-specific Assessment & Plan notes found for this encounter.  Assessment & plan notes cannot be loaded without a specified hospital service.  Plan of Care  Pharmacotherapy (Medications Ordered): No orders of the defined types were placed in this encounter.  Lab-work, procedure(s), and/or referral(s): Orders Placed This Encounter  Procedures  . KNEE INJECTION    Pharmacotherapy: Opioid Analgesics: We'll take over management today. See above orders Membrane stabilizer: We have discussed the possibility of optimizing this mode of therapy, if tolerated Muscle relaxant: We have discussed the possibility of a trial NSAID: We have discussed the possibility of a trial Other analgesic(s): To be determined at a later time   Interventional therapies: Planned, scheduled, and/or pending:    Diagnostic bilateral intra-articular knee joint injection with local anesthetic and steroid    Considering:    Diagnostic bilateral intra-articular knee joint injection with local anesthetic and steroid  Series of 5 intra-articular Hyalgan knee injections, bilaterally  Diagnostic bilateral Genicular nerve block  Possible bilateral Genicular RFA  Diagnostic cervical epidural steroid injection  Diagnostic bilateral cervical facet block  Possible bilateral cervical facet RFA  Diagnostic right intra-articular hip joint injection  Diagnostic right femoral and obturator nerve block  Possible right femoral and obturator nerve RFA  Diagnostic bilateral occipital nerve block  Possible bilateral occipital nerve RFA  Diagnostic bilateral C2 + TON nerve block  Possible bilateral C2 + TON RFA  Possible bilateral occipital peripheral nerve stimulator trial    PRN Procedures:   To be determined at a later time   Provider-requested follow-up: Return for NS procedure, (ASAP), by MD.  Future Appointments Date Time Provider [place]  08/31/2016 7:45 AM Milinda Pointer, MD [facility] None    Primary Care Physician: Kathrine Haddock, NP Location: [facility] Note by: Kathlen Brunswick. Dossie Arbour, M.D, DABA, DABAPM, DABPM, DABIPP, FIPP Date: 08/27/2016; Time: 12:32 PM  Patient instructions provided during this appointment: Patient Instructions  ____________________________________________________________________________________________  Preparing for your procedure (without sedation) Instructions: . Oral Intake: Do not eat or drink anything for at least 3 hours prior to your procedure. . Transportation: Unless otherwise stated by your physician, you may drive yourself after the procedure. . Blood Pressure Medicine: Take your blood pressure medicine with a sip of water the morning of the procedure. . Blood thinners:  . Diabetics on insulin: Notify the staff so that you can be scheduled 1st case in the morning. If your diabetes requires high dose insulin, take only  of your  normal insulin dose the morning of the procedure and notify the staff that you have done so. . Preventing infections: Shower with an antibacterial soap the morning of your procedure.  . Build-up your immune system: Take 1000 mg of Vitamin C with every meal (3 times a day) the day prior to your procedure. Marland Kitchen Antibiotics: Inform the staff if you have a condition or reason that requires you to take antibiotics before dental procedures. . Pregnancy: If you are pregnant, call and cancel the procedure. . Sickness: If you have a cold, fever, or any active infections, call and cancel the procedure. . Arrival: You must be in the facility at least  30 minutes prior to your scheduled procedure. . Children: Do not bring any children with you. . Dress appropriately: Bring dark clothing that you would not mind if they get stained. . Valuables: Do not bring any jewelry or valuables. Procedure appointments are reserved for interventional treatments only. Marland Kitchen No Prescription Refills. . No medication changes will be discussed during procedure appointments. . No disability issues will be discussed. ____________________________________________________________________________________________

## 2016-08-27 ENCOUNTER — Ambulatory Visit: Payer: BLUE CROSS/BLUE SHIELD | Attending: Pain Medicine | Admitting: Pain Medicine

## 2016-08-27 ENCOUNTER — Telehealth: Payer: Self-pay | Admitting: Unknown Physician Specialty

## 2016-08-27 ENCOUNTER — Encounter: Payer: Self-pay | Admitting: Pain Medicine

## 2016-08-27 VITALS — BP 147/85 | HR 70 | Temp 98.0°F | Resp 18 | Ht 68.0 in | Wt 273.0 lb

## 2016-08-27 DIAGNOSIS — Z79899 Other long term (current) drug therapy: Secondary | ICD-10-CM | POA: Insufficient documentation

## 2016-08-27 DIAGNOSIS — Z9851 Tubal ligation status: Secondary | ICD-10-CM | POA: Diagnosis not present

## 2016-08-27 DIAGNOSIS — M25551 Pain in right hip: Secondary | ICD-10-CM | POA: Diagnosis not present

## 2016-08-27 DIAGNOSIS — M542 Cervicalgia: Secondary | ICD-10-CM | POA: Diagnosis not present

## 2016-08-27 DIAGNOSIS — M50322 Other cervical disc degeneration at C5-C6 level: Secondary | ICD-10-CM | POA: Insufficient documentation

## 2016-08-27 DIAGNOSIS — M25562 Pain in left knee: Secondary | ICD-10-CM | POA: Diagnosis not present

## 2016-08-27 DIAGNOSIS — M25561 Pain in right knee: Secondary | ICD-10-CM | POA: Diagnosis not present

## 2016-08-27 DIAGNOSIS — G4486 Cervicogenic headache: Secondary | ICD-10-CM

## 2016-08-27 DIAGNOSIS — K589 Irritable bowel syndrome without diarrhea: Secondary | ICD-10-CM | POA: Insufficient documentation

## 2016-08-27 DIAGNOSIS — Z9889 Other specified postprocedural states: Secondary | ICD-10-CM | POA: Insufficient documentation

## 2016-08-27 DIAGNOSIS — F419 Anxiety disorder, unspecified: Secondary | ICD-10-CM | POA: Insufficient documentation

## 2016-08-27 DIAGNOSIS — M797 Fibromyalgia: Secondary | ICD-10-CM | POA: Diagnosis not present

## 2016-08-27 DIAGNOSIS — L719 Rosacea, unspecified: Secondary | ICD-10-CM | POA: Diagnosis not present

## 2016-08-27 DIAGNOSIS — M170 Bilateral primary osteoarthritis of knee: Secondary | ICD-10-CM | POA: Diagnosis not present

## 2016-08-27 DIAGNOSIS — G4733 Obstructive sleep apnea (adult) (pediatric): Secondary | ICD-10-CM | POA: Insufficient documentation

## 2016-08-27 DIAGNOSIS — R51 Headache: Secondary | ICD-10-CM

## 2016-08-27 DIAGNOSIS — G8929 Other chronic pain: Secondary | ICD-10-CM | POA: Diagnosis not present

## 2016-08-27 DIAGNOSIS — I129 Hypertensive chronic kidney disease with stage 1 through stage 4 chronic kidney disease, or unspecified chronic kidney disease: Secondary | ICD-10-CM | POA: Insufficient documentation

## 2016-08-27 DIAGNOSIS — R519 Headache, unspecified: Secondary | ICD-10-CM | POA: Insufficient documentation

## 2016-08-27 DIAGNOSIS — M1711 Unilateral primary osteoarthritis, right knee: Secondary | ICD-10-CM

## 2016-08-27 DIAGNOSIS — G894 Chronic pain syndrome: Secondary | ICD-10-CM | POA: Diagnosis not present

## 2016-08-27 DIAGNOSIS — E669 Obesity, unspecified: Secondary | ICD-10-CM | POA: Diagnosis not present

## 2016-08-27 DIAGNOSIS — Z6841 Body Mass Index (BMI) 40.0 and over, adult: Secondary | ICD-10-CM | POA: Diagnosis not present

## 2016-08-27 DIAGNOSIS — Z87891 Personal history of nicotine dependence: Secondary | ICD-10-CM | POA: Diagnosis not present

## 2016-08-27 DIAGNOSIS — N181 Chronic kidney disease, stage 1: Secondary | ICD-10-CM | POA: Diagnosis not present

## 2016-08-27 NOTE — Progress Notes (Signed)
Safety precautions to be maintained throughout the outpatient stay will include: orient to surroundings, keep bed in low position, maintain call bell within reach at all times, provide assistance with transfer out of bed and ambulation.  

## 2016-08-27 NOTE — Patient Instructions (Signed)
____________________________________________________________________________________________  Preparing for your procedure (without sedation) Instructions: . Oral Intake: Do not eat or drink anything for at least 3 hours prior to your procedure. . Transportation: Unless otherwise stated by your physician, you may drive yourself after the procedure. . Blood Pressure Medicine: Take your blood pressure medicine with a sip of water the morning of the procedure. . Blood thinners:  . Diabetics on insulin: Notify the staff so that you can be scheduled 1st case in the morning. If your diabetes requires high dose insulin, take only  of your normal insulin dose the morning of the procedure and notify the staff that you have done so. . Preventing infections: Shower with an antibacterial soap the morning of your procedure.  . Build-up your immune system: Take 1000 mg of Vitamin C with every meal (3 times a day) the day prior to your procedure. . Antibiotics: Inform the staff if you have a condition or reason that requires you to take antibiotics before dental procedures. . Pregnancy: If you are pregnant, call and cancel the procedure. . Sickness: If you have a cold, fever, or any active infections, call and cancel the procedure. . Arrival: You must be in the facility at least 30 minutes prior to your scheduled procedure. . Children: Do not bring any children with you. . Dress appropriately: Bring dark clothing that you would not mind if they get stained. . Valuables: Do not bring any jewelry or valuables. Procedure appointments are reserved for interventional treatments only. . No Prescription Refills. . No medication changes will be discussed during procedure appointments. . No disability issues will be discussed. ____________________________________________________________________________________________   

## 2016-08-27 NOTE — Telephone Encounter (Signed)
Routing to provider. Malachy Mood, do you know where this patient could get a thumb brace?

## 2016-08-27 NOTE — Telephone Encounter (Signed)
Patient asked about where she could find a brace for her hand/thumb because she broke her thumb . Patient says she has been seen for her thumb by another doctor but wanted to see if the CMA or another provider knew where she could find a brace for her thumb.  Please Advise.  Thank you.

## 2016-08-28 NOTE — Telephone Encounter (Signed)
Called and let patient know what Malachy Mood said. Patient stated that she has never seen anyone for her thumb and that she just wanted to know of a place that may carry thumb splints. So I placed the patient on hold and went to explain the conversation with the patient to [place]. Malachy Mood said a thumb spika splint and that they could be picked up at a local pharmacy. I let patient know this and she said she would try to find one.

## 2016-08-28 NOTE — Telephone Encounter (Signed)
She needs to ask he provider who saw her what is the appropriate brace for her particular fracture

## 2016-08-31 ENCOUNTER — Encounter: Payer: Self-pay | Admitting: Pain Medicine

## 2016-08-31 ENCOUNTER — Ambulatory Visit: Payer: BLUE CROSS/BLUE SHIELD | Attending: Pain Medicine | Admitting: Pain Medicine

## 2016-08-31 VITALS — BP 150/88 | HR 72 | Temp 97.6°F | Resp 16 | Ht 68.0 in | Wt 273.0 lb

## 2016-08-31 DIAGNOSIS — G894 Chronic pain syndrome: Secondary | ICD-10-CM | POA: Diagnosis not present

## 2016-08-31 DIAGNOSIS — M542 Cervicalgia: Secondary | ICD-10-CM | POA: Diagnosis not present

## 2016-08-31 DIAGNOSIS — M25551 Pain in right hip: Secondary | ICD-10-CM | POA: Diagnosis not present

## 2016-08-31 DIAGNOSIS — M1711 Unilateral primary osteoarthritis, right knee: Secondary | ICD-10-CM

## 2016-08-31 DIAGNOSIS — Z885 Allergy status to narcotic agent status: Secondary | ICD-10-CM | POA: Insufficient documentation

## 2016-08-31 DIAGNOSIS — M25562 Pain in left knee: Secondary | ICD-10-CM

## 2016-08-31 DIAGNOSIS — G8929 Other chronic pain: Secondary | ICD-10-CM

## 2016-08-31 DIAGNOSIS — Z888 Allergy status to other drugs, medicaments and biological substances status: Secondary | ICD-10-CM | POA: Insufficient documentation

## 2016-08-31 DIAGNOSIS — M25561 Pain in right knee: Secondary | ICD-10-CM

## 2016-08-31 DIAGNOSIS — M170 Bilateral primary osteoarthritis of knee: Secondary | ICD-10-CM | POA: Diagnosis not present

## 2016-08-31 MED ORDER — LIDOCAINE HCL (PF) 1 % IJ SOLN
9.0000 mL | Freq: Once | INTRAMUSCULAR | Status: AC
  Administered 2016-08-31: 5 mL

## 2016-08-31 MED ORDER — ROPIVACAINE HCL 2 MG/ML IJ SOLN
INTRAMUSCULAR | Status: AC
  Filled 2016-08-31: qty 10

## 2016-08-31 MED ORDER — ROPIVACAINE HCL 2 MG/ML IJ SOLN
4.0000 mL | Freq: Once | INTRAMUSCULAR | Status: AC
  Administered 2016-08-31: 4 mL via INTRA_ARTICULAR

## 2016-08-31 MED ORDER — LIDOCAINE HCL (PF) 1 % IJ SOLN
INTRAMUSCULAR | Status: AC
  Filled 2016-08-31: qty 10

## 2016-08-31 MED ORDER — METHYLPREDNISOLONE ACETATE 80 MG/ML IJ SUSP
INTRAMUSCULAR | Status: AC
  Filled 2016-08-31: qty 1

## 2016-08-31 MED ORDER — METHYLPREDNISOLONE ACETATE 80 MG/ML IJ SUSP
80.0000 mg | Freq: Once | INTRAMUSCULAR | Status: AC
  Administered 2016-08-31: 80 mg

## 2016-08-31 NOTE — Progress Notes (Addendum)
Patient's Name: Cathy Mitchell  MRN: [id number]  Referring Provider: Kathrine Haddock, NP  DOB: [birth year]  PCP: Kathrine Haddock, NP  DOS: 08/31/2016  Note by: Kathlen Brunswick. Dossie Arbour, MD  Service setting: Ambulatory outpatient  Location: [facility] (AMB) Pain Management Facility  Visit type: Procedure  Specialty: Interventional Pain Management  Patient type: Established   Primary Reason for Visit: Interventional Pain Management Treatment. CC: Knee Pain (bilateral); Neck Pain; and Hip Pain (right)  Procedure:  Anesthesia, Analgesia, Anxiolysis:  Type: Diagnostic Intra-Articular Local anesthetic and steroid Knee Injection Region: Lateral infrapatellar Knee Region Level: Knee Joint Laterality: Bilateral  Type: Local Anesthesia Local Anesthetic: Lidocaine 1% Route: Infiltration ([place]/IM) IV Access: Declined Sedation: Declined  Indication(s): Analgesia          Indications: 1. Osteoarthritis of knee (Bilateral) (R>L)   2. Osteoarthritis of knee (Right)   3. Chronic knee pain (Location of Primary Source of Pain) (Bilateral) (R>L)   4. Chronic knee pain (Right)   5. Chronic pain syndrome   6. Tricompartment osteoarthritis of knee (Right)    Pain Score: Pre-procedure: 3 /10 Post-procedure:  (left knee 0,  right knee 1)/10  Pre-op Assessment:  Previous date of service: 08/27/16 Service provided: Evaluation Cathy Mitchell is a 51 y.o. (year old), female patient, seen today for interventional treatment. She  has a past surgical history that includes Tubal ligation; Dilation and curettage of uterus; Ganglion cyst excision (Bilateral); Hand surgery (Bilateral, Resection); Gastric bypass; and Ganglion cyst excision (Right, 10/08/2015). Her primarily concern today is the Knee Pain (bilateral); Neck Pain; and Hip Pain (right)  Initial Vital Signs: Blood pressure (!) 155/86, pulse 87, temperature 97.6 F (36.4 C), resp. rate 18, height 5\' 8"  (1.727 m), weight 273 lb (123.8 kg), last menstrual period 04/15/2015, SpO2 97  %. BMI: 41.51 kg/m  Risk Assessment: Allergies: Reviewed. She is allergic to oxycodone-acetaminophen; nsaids; other; and percocet [oxycodone-acetaminophen].  Allergy Precautions: None required Coagulopathies: Reviewed. None identified.  Blood-thinner therapy: None at this time Active Infection(s): Reviewed. None identified. Cathy Mitchell is afebrile  Site Confirmation: Cathy Mitchell was asked to confirm the procedure and laterality before marking the site Procedure checklist: Completed Consent: Before the procedure and under the influence of no sedative(s), amnesic(s), or anxiolytics, the patient was informed of the treatment options, risks and possible complications. To fulfill our ethical and legal obligations, as recommended by the American Medical Association's Code of Ethics, I have informed the patient of my clinical impression; the nature and purpose of the treatment or procedure; the risks, benefits, and possible complications of the intervention; the alternatives, including doing nothing; the risk(s) and benefit(s) of the alternative treatment(s) or procedure(s); and the risk(s) and benefit(s) of doing nothing. The patient was provided information about the general risks and possible complications associated with the procedure. These may include, but are not limited to: failure to achieve desired goals, infection, bleeding, organ or nerve damage, allergic reactions, paralysis, and death. In addition, the patient was informed of those risks and complications associated to the procedure, such as failure to decrease pain; infection; bleeding; organ or nerve damage with subsequent damage to sensory, motor, and/or autonomic systems, resulting in permanent pain, numbness, and/or weakness of one or several areas of the body; allergic reactions; (i.e.: anaphylactic reaction); and/or death. Furthermore, the patient was informed of those risks and complications associated with the medications. These include,  but are not limited to: allergic reactions (i.e.: anaphylactic or anaphylactoid reaction(s)); adrenal axis suppression; blood sugar elevation that in diabetics may  result in ketoacidosis or comma; water retention that in patients with history of congestive heart failure may result in shortness of breath, pulmonary edema, and decompensation with resultant heart failure; weight gain; swelling or edema; medication-induced neural toxicity; particulate matter embolism and blood vessel occlusion with resultant organ, and/or nervous system infarction; and/or aseptic necrosis of one or more joints. Finally, the patient was informed that Medicine is not an exact science; therefore, there is also the possibility of unforeseen or unpredictable risks and/or possible complications that may result in a catastrophic outcome. The patient indicated having understood very clearly. We have given the patient no guarantees and we have made no promises. Enough time was given to the patient to ask questions, all of which were answered to the patient's satisfaction. Cathy Mitchell has indicated that she wanted to continue with the procedure. Attestation: I, the ordering provider, attest that I have discussed with the patient the benefits, risks, side-effects, alternatives, likelihood of achieving goals, and potential problems during recovery for the procedure that I have provided informed consent. Date: 08/31/2016; Time: 1:24 PM  Pre-Procedure Preparation:  Monitoring: As per clinic protocol. Respiration, ETCO2, SpO2, BP, heart rate and rhythm monitor placed and checked for adequate function Safety Precautions: Patient was assessed for positional comfort and pressure points before starting the procedure. Time-out: I initiated and conducted the "Time-out" before starting the procedure, as per protocol. The patient was asked to participate by confirming the accuracy of the "Time Out" information. Verification of the correct person, site,  and procedure were performed and confirmed by me, the nursing staff, and the patient. "Time-out" conducted as per Joint Commission's Universal Protocol (UP.01.01.01). "Time-out" Date & Time: 08/31/2016; 1326 hrs.  Description of Procedure Process:   Position: Sitting Target Area: Knee Joint Approach: Just above the Lateral tibial plateau, lateral to the infrapatellar tendon. Area Prepped: Entire knee area, from the mid-thigh to the mid-shin. Prepping solution: ChloraPrep (2% chlorhexidine gluconate and 70% isopropyl alcohol) Safety Precautions: Aspiration looking for blood return was conducted prior to all injections. At no point did we inject any substances, as a needle was being advanced. No attempts were made at seeking any paresthesias. Safe injection practices and needle disposal techniques used. Medications properly checked for expiration dates. SDV (single dose vial) medications used. Description of the Procedure: Protocol guidelines were followed. The patient was placed in position over the fluoroscopy table. The target area was identified and the area prepped in the usual manner. Skin desensitized using vapocoolant spray. Skin & deeper tissues infiltrated with local anesthetic. Appropriate amount of time allowed to pass for local anesthetics to take effect. The procedure needles were then advanced to the target area. Proper needle placement secured. Negative aspiration confirmed. Solution injected in intermittent fashion, asking for systemic symptoms every 0.5cc of injectate. The needles were then removed and the area cleansed, making sure to leave some of the prepping solution back to take advantage of its long term bactericidal properties. Vitals:   08/31/16 1235 08/31/16 1326 08/31/16 1329  BP: (!) 155/86 (!) 157/88 (!) 150/88  Pulse: 87 74 72  Resp: 18 16 16   Temp: 97.6 F (36.4 C)    SpO2: 97% 99% 98%  Weight: 273 lb (123.8 kg)    Height: 5\' 8"  (1.727 m)      Start Time: 1326  hrs. End Time: 1329 hrs. Materials:  Needle(s) Type: Regular needle Gauge: 22G Length: 3.5-in Medication(s): We administered methylPREDNISolone acetate, lidocaine (PF), and ropivacaine (PF) 2 mg/mL (0.2%). Please see chart  orders for dosing details.  Imaging Guidance:  Type of Imaging Technique: None used Indication(s): N/A Exposure Time: No patient exposure Contrast: None used. Fluoroscopic Guidance: N/A Ultrasound Guidance: N/A Interpretation: N/A  Antibiotic Prophylaxis:  Indication(s): None identified Antibiotic given: None  Post-operative Assessment:  EBL: None Complications: No immediate post-treatment complications observed by team, or reported by patient. Note: The patient tolerated the entire procedure well. A repeat set of vitals were taken after the procedure and the patient was kept under observation following institutional policy, for this type of procedure. Post-procedural neurological assessment was performed, showing return to baseline, prior to discharge. The patient was provided with post-procedure discharge instructions, including a section on how to identify potential problems. Should any problems arise concerning this procedure, the patient was given instructions to immediately contact us, at any time, without hesitation. In any case, we plan to contact the patient by telephone for a follow-up status report regarding this interventional procedure. Comments:  No additional relevant information.  Plan of Care  Disposition: Discharge home  Discharge Date & Time: 08/31/2016; 1335 hrs.  Physician-requested Follow-up:  Return for post-procedure eval (in 2 wks), w/ MD.  Future Appointments Date Time Provider [place]  09/17/2016 11:00 AM Milinda Pointer, MD [facility] None   Medications ordered for procedure: Meds ordered this encounter  Medications  . methylPREDNISolone acetate (DEPO-MEDROL) injection 80 mg  . lidocaine (PF) (XYLOCAINE) 1 % injection 9  mL  . ropivacaine (PF) 2 mg/mL (0.2%) (NAROPIN) injection 4 mL   Medications administered: We administered methylPREDNISolone acetate, lidocaine (PF), and ropivacaine (PF) 2 mg/mL (0.2%).  See the medical record for exact dosing, route, and time of administration.  Lab-work, Procedure(s), & Referral(s) Ordered: Orders Placed This Encounter  Procedures  . KNEE INJECTION   Imaging Ordered: New Prescriptions   No medications on file   Primary Care Physician: Kathrine Haddock, NP Location: [facility] Note by: Kathlen Brunswick. Dossie Arbour, M.D, DABA, DABAPM, DABPM, DABIPP, FIPP Date: 08/31/2016; Time: 1:49 PM  Disclaimer:  Medicine is not an Chief Strategy Officer. The only guarantee in medicine is that nothing is guaranteed. It is important to note that the decision to proceed with this intervention was based on the information collected from the patient. The Data and conclusions were drawn from the patient's questionnaire, the interview, and the physical examination. Because the information was provided in large part by the patient, it cannot be guaranteed that it has not been purposely or unconsciously manipulated. Every effort has been made to obtain as much relevant data as possible for this evaluation. It is important to note that the conclusions that lead to this procedure are derived in large part from the available data. Always take into account that the treatment will also be dependent on availability of resources and existing treatment guidelines, considered by other Pain Management Practitioners as being common knowledge and practice, at the time of the intervention. For Medico-Legal purposes, it is also important to point out that variation in procedural techniques and pharmacological choices are the acceptable norm. The indications, contraindications, technique, and results of the above procedure should only be interpreted and judged by a Board-Certified Interventional Pain  Specialist with extensive familiarity and expertise in the same exact procedure and technique.  Instructions provided at this appointment: Patient Instructions  ____________________________________________________________________________________________  Post-Procedure instructions Instructions:  Apply ice: Fill a plastic sandwich bag with crushed ice. Cover it with a small towel and apply to injection site. Apply for 15 minutes then remove x  15 minutes. Repeat sequence on day of procedure, until you go to bed. The purpose is to minimize swelling and discomfort after procedure.  Apply heat: Apply heat to procedure site starting the day following the procedure. The purpose is to treat any soreness and discomfort from the procedure.  Food intake: Start with clear liquids (like water) and advance to regular food, as tolerated.   Physical activities: Keep activities to a minimum for the first 8 hours after the procedure.   Driving: If you have received any sedation, you are not allowed to drive for 24 hours after your procedure.  Blood thinner: Restart your blood thinner 6 hours after your procedure. (Only for those taking blood thinners)  Insulin: As soon as you can eat, you may resume your normal dosing schedule. (Only for those taking insulin)  Infection prevention: Keep procedure site clean and dry.  Post-procedure Pain Diary: Extremely important that this be done correctly and accurately. Recorded information will be used to determine the next step in treatment.  Pain evaluated is that of treated area only. Do not include pain from an untreated area.  Complete every hour, on the hour, for the initial 8 hours. Set an alarm to help you do this part accurately.  Do not go to sleep and have it completed later. It will not be accurate.  Follow-up appointment: Keep your follow-up appointment after the procedure. Usually 2 weeks for most procedures. (6 weeks in the case of radiofrequency.)  Bring you pain diary.  Expect:  From numbing medicine (AKA: Local Anesthetics): Numbness or decrease in pain.  Onset: Full effect within 15 minutes of injected.  Duration: It will depend on the type of local anesthetic used. On the average, 1 to 8 hours.   From steroids: Decrease in swelling or inflammation. Once inflammation is improved, relief of the pain will follow.  Onset of benefits: Depends on the amount of swelling present. The more swelling, the longer it will take for the benefits to be seen.   Duration: Steroids will stay in the system x 2 weeks. Duration of benefits will depend on multiple posibilities including persistent irritating factors.  From procedure: Some discomfort is to be expected once the numbing medicine wears off. This should be minimal if ice and heat are applied as instructed. Call if:  You experience numbness and weakness that gets worse with time, as opposed to wearing off.  New onset bowel or bladder incontinence. (Spinal procedures only)  Emergency Numbers:  [facility] business hours (Monday - Thursday, 8:00 AM - 4:00 PM) (Friday, 9:00 AM - 12:00 Noon): [phone]  After hours: [phone] ____________________________________________________________________________________________

## 2016-08-31 NOTE — Patient Instructions (Signed)
Post-Procedure instructions Instructions:  Apply ice: Fill a plastic sandwich bag with crushed ice. Cover it with a small towel and apply to injection site. Apply for 15 minutes then remove x 15 minutes. Repeat sequence on day of procedure, until you go to bed. The purpose is to minimize swelling and discomfort after procedure.  Apply heat: Apply heat to procedure site starting the day following the procedure. The purpose is to treat any soreness and discomfort from the procedure.  Food intake: Start with clear liquids (like water) and advance to regular food, as tolerated.   Physical activities: Keep activities to a minimum for the first 8 hours after the procedure.   Driving: If you have received any sedation, you are not allowed to drive for 24 hours after your procedure.  Blood thinner: Restart your blood thinner 6 hours after your procedure. (Only for those taking blood thinners)  Insulin: As soon as you can eat, you may resume your normal dosing schedule. (Only for those taking insulin)  Infection prevention: Keep procedure site clean and dry.  Post-procedure Pain Diary: Extremely important that this be done correctly and accurately. Recorded information will be used to determine the next step in treatment.  Pain evaluated is that of treated area only. Do not include pain from an untreated area.  Complete every hour, on the hour, for the initial 8 hours. Set an alarm to help you do this part accurately.  Do not go to sleep and have it completed later. It will not be accurate.  Follow-up appointment: Keep your follow-up appointment after the procedure. Usually 2 weeks for most procedures. (6 weeks in the case of radiofrequency.) Bring you pain diary.  Expect:  From numbing medicine (AKA: Local Anesthetics): Numbness or decrease in pain.  Onset: Full effect within 15 minutes of injected.  Duration: It will depend on the type of local anesthetic used. On the average, 1 to 8  hours.   From steroids: Decrease in swelling or inflammation. Once inflammation is improved, relief of the pain will follow.  Onset of benefits: Depends on the amount of swelling present. The more swelling, the longer it will take for the benefits to be seen.   Duration: Steroids will stay in the system x 2 weeks. Duration of benefits will depend on multiple posibilities including persistent irritating factors.  From procedure: Some discomfort is to be expected once the numbing medicine wears off. This should be minimal if ice and heat are applied as instructed. Call if:  You experience numbness and weakness that gets worse with time, as opposed to wearing off.  New onset bowel or bladder incontinence. (Spinal procedures only)  Emergency Numbers:  [place] business hours (Monday - Thursday, 8:00 AM - 4:00 PM) (Friday, 9:00 AM - 12:00 Noon): [phone]  After hours: [phone]   __________________________________________________________________________________________    

## 2016-09-01 ENCOUNTER — Telehealth: Payer: Self-pay | Admitting: *Deleted

## 2016-09-01 NOTE — Telephone Encounter (Signed)
Patient verbalizes no questions or concerns from procedure.  

## 2016-09-17 ENCOUNTER — Ambulatory Visit: Payer: BLUE CROSS/BLUE SHIELD | Attending: Pain Medicine | Admitting: Pain Medicine

## 2016-09-17 ENCOUNTER — Encounter: Payer: Self-pay | Admitting: Pain Medicine

## 2016-09-17 VITALS — BP 123/76 | HR 72 | Temp 97.7°F | Resp 16 | Ht 68.0 in | Wt 265.0 lb

## 2016-09-17 DIAGNOSIS — F1190 Opioid use, unspecified, uncomplicated: Secondary | ICD-10-CM

## 2016-09-17 DIAGNOSIS — Z79899 Other long term (current) drug therapy: Secondary | ICD-10-CM | POA: Diagnosis not present

## 2016-09-17 DIAGNOSIS — Z9884 Bariatric surgery status: Secondary | ICD-10-CM | POA: Insufficient documentation

## 2016-09-17 DIAGNOSIS — M170 Bilateral primary osteoarthritis of knee: Secondary | ICD-10-CM

## 2016-09-17 DIAGNOSIS — L719 Rosacea, unspecified: Secondary | ICD-10-CM | POA: Diagnosis not present

## 2016-09-17 DIAGNOSIS — Z79891 Long term (current) use of opiate analgesic: Secondary | ICD-10-CM | POA: Diagnosis not present

## 2016-09-17 DIAGNOSIS — E669 Obesity, unspecified: Secondary | ICD-10-CM | POA: Diagnosis not present

## 2016-09-17 DIAGNOSIS — M25561 Pain in right knee: Secondary | ICD-10-CM

## 2016-09-17 DIAGNOSIS — G894 Chronic pain syndrome: Secondary | ICD-10-CM | POA: Insufficient documentation

## 2016-09-17 DIAGNOSIS — Z885 Allergy status to narcotic agent status: Secondary | ICD-10-CM | POA: Diagnosis not present

## 2016-09-17 DIAGNOSIS — R0602 Shortness of breath: Secondary | ICD-10-CM | POA: Diagnosis not present

## 2016-09-17 DIAGNOSIS — M25562 Pain in left knee: Secondary | ICD-10-CM

## 2016-09-17 DIAGNOSIS — G4733 Obstructive sleep apnea (adult) (pediatric): Secondary | ICD-10-CM | POA: Diagnosis not present

## 2016-09-17 DIAGNOSIS — I129 Hypertensive chronic kidney disease with stage 1 through stage 4 chronic kidney disease, or unspecified chronic kidney disease: Secondary | ICD-10-CM | POA: Diagnosis not present

## 2016-09-17 DIAGNOSIS — F17210 Nicotine dependence, cigarettes, uncomplicated: Secondary | ICD-10-CM | POA: Insufficient documentation

## 2016-09-17 DIAGNOSIS — F419 Anxiety disorder, unspecified: Secondary | ICD-10-CM | POA: Insufficient documentation

## 2016-09-17 DIAGNOSIS — K589 Irritable bowel syndrome without diarrhea: Secondary | ICD-10-CM | POA: Insufficient documentation

## 2016-09-17 DIAGNOSIS — M797 Fibromyalgia: Secondary | ICD-10-CM | POA: Diagnosis not present

## 2016-09-17 DIAGNOSIS — M25551 Pain in right hip: Secondary | ICD-10-CM | POA: Diagnosis not present

## 2016-09-17 DIAGNOSIS — M542 Cervicalgia: Secondary | ICD-10-CM | POA: Diagnosis not present

## 2016-09-17 DIAGNOSIS — R51 Headache: Secondary | ICD-10-CM | POA: Diagnosis not present

## 2016-09-17 DIAGNOSIS — G8929 Other chronic pain: Secondary | ICD-10-CM | POA: Diagnosis not present

## 2016-09-17 DIAGNOSIS — Z9889 Other specified postprocedural states: Secondary | ICD-10-CM | POA: Insufficient documentation

## 2016-09-17 NOTE — Progress Notes (Signed)
Safety precautions to be maintained throughout the outpatient stay will include: orient to surroundings, keep bed in low position, maintain call bell within reach at all times, provide assistance with transfer out of bed and ambulation.  

## 2016-09-17 NOTE — Progress Notes (Signed)
Patient's Name: Cathy Mitchell  MRN: [id number]  Referring Provider: Kathrine Haddock, NP  DOB: Apr 15, 1965  PCP: Kathrine Haddock, NP  DOS: 09/17/2016  Note by: Kathlen Brunswick. Dossie Arbour, MD  Service setting: Ambulatory outpatient  Specialty: Interventional Pain Management  Location: [facility] (AMB) Pain Management Facility    Patient type: Established   Primary Reason(s) for Visit: Encounter for post-procedure evaluation of chronic illness with mild to moderate exacerbation CC: Knee Pain (both )  HPI  Cathy Mitchell is a 51 y.o. year old, female patient, who comes today for a post-procedure evaluation. She has Primary generalized hypertrophic osteoarthrosis; Obstructive sleep apnea; Chronic pain syndrome; Irritable bowel syndrome; Tobacco abuse; Acute anxiety; Fibromyalgia; Sleep apnea; Extreme obesity; Chronic kidney disease, stage 1, normal or increased GFR; Numbness; Rosacea; SOB (shortness of breath); Wheat allergy; Chronic hip pain (Location of Tertiary source of pain) (Right); Pain management; Chronic knee pain (Right); Eczema of right hand; Tricompartment osteoarthritis of knee (Right); Osteoarthritis of knee (Right); Osteoarthritis; Long term (current) use of opiate analgesic; Long term prescription opiate use; Opiate use; Chronic knee pain (Location of Primary Source of Pain) (Bilateral) (R>L); Osteoarthritis of knee (Bilateral) (R>L); Chronic neck pain (Location of Secondary source of pain) (Bilateral); Osteoarthritis of hip (Right); Occipital headaches (Bilateral) (L>R); and Cervicogenic headache (Bilateral) (L>R) on her problem list. Her primarily concern today is the Knee Pain (both )  Pain Assessment: Self-Reported Pain Score: 3 /10             Reported level is compatible with observation.       Pain Type: Chronic pain Pain Location: Knee Pain Orientation: Right, Left Pain Descriptors / Indicators: Sharp, Shooting, Stabbing, Constant Pain Frequency: Constant  Cathy Mitchell comes in today for  post-procedure evaluation after the treatment done on 08/31/2016.  Further details on both, my assessment(s), as well as the proposed treatment plan, please see below.  Post-Procedure Assessment  08/31/2016 Procedure: Diagnostic bilateral intra-articular knee joint injection with local anesthetic and steroid Pre-procedure pain score:  3/10 Post-procedure pain score: 0/10 (More than 50% relief) Influential Factors: BMI: 40.29 kg/m Intra-procedural challenges: None observed Assessment challenges: None detected         Post-procedural adverse reactions or complications: None reported Reported side-effects: None  Sedation: No sedation used. When no sedatives are used, the analgesic levels obtained are directly associated to the effectiveness of the local anesthetics. However, when sedation is provided, the level of analgesia obtained during the initial 1 hour following the intervention, is believed to be the result of a combination of factors. These factors may include, but are not limited to: 1. The effectiveness of the local anesthetics used. 2. The effects of the analgesic(s) and/or anxiolytic(s) used. 3. The degree of discomfort experienced by the patient at the time of the procedure. 4. The patients ability and reliability in recalling and recording the events. 5. The presence and influence of possible secondary gains and/or psychosocial factors. Reported result: Relief experienced during the 1st hour after the procedure: 100 % (Ultra-Short Term Relief) Interpretative annotation: Analgesia during this period is likely to be Local Anesthetic and/or IV Sedative (Analgesic/Anxiolitic) related.          Effects of local anesthetic: The analgesic effects attained during this period are directly associated to the localized infiltration of local anesthetics and therefore cary significant diagnostic value as to the etiological location, or anatomical origin, of the pain. Expected duration of relief is  directly dependent on the pharmacodynamics of the local anesthetic used. Long-acting (4-6  hours) anesthetics used.  Reported result: Relief during the next 4 to 6 hour after the procedure: 90 % (Short-Term Relief) Interpretative annotation: Complete relief would suggest area to be the source of the pain.          Long-term benefit: Defined as the period of time past the expected duration of local anesthetics. With the possible exception of prolonged sympathetic blockade from the local anesthetics, benefits during this period are typically attributed to, or associated with, other factors such as analgesic sensory neuropraxia, antiinflammatory effects, or beneficial biochemical changes provided by agents other than the local anesthetics Reported result: Extended relief following procedure: 0 % (had relief 3-4 days ) (Long-Term Relief) Interpretative annotation: No long-term benefit. This could suggest limited inflammatory component to the pain with possible mechanical aggravating factors.          Current benefits: Defined as persistent relief that continues at this point in time.   Reported results: Treated area: 0 %       Interpretative annotation: Recurrance of symptoms. This would suggest persistent aggravating factors  Interpretation: Results would suggest a successful diagnostic intervention.          Laboratory Chemistry  Inflammation Markers Lab Results  Component Value Date   CRP <0.8 08/04/2016   ESRSEDRATE 15 08/04/2016   (CRP: Acute Phase) (ESR: Chronic Phase) Renal Function Markers Lab Results  Component Value Date   BUN 19 07/31/2016   CREATININE 0.70 07/31/2016   GFRAA 118 07/31/2016   GFRNONAA 102 07/31/2016   Hepatic Function Markers Lab Results  Component Value Date   AST 17 07/31/2016   ALT 17 07/31/2016   ALBUMIN 4.6 07/31/2016   ALKPHOS 84 07/31/2016   Electrolytes Lab Results  Component Value Date   NA 139 07/31/2016   K 4.5 07/31/2016   CL 98 07/31/2016    CALCIUM 9.6 07/31/2016   MG 2.2 08/04/2016   Neuropathy Markers Lab Results  Component Value Date   VITAMINB12 457 08/04/2016   Bone Pathology Markers Lab Results  Component Value Date   ALKPHOS 84 07/31/2016   25OHVITD1 32 08/04/2016   25OHVITD2 2.1 08/04/2016   25OHVITD3 30 08/04/2016   CALCIUM 9.6 07/31/2016   Coagulation Parameters Lab Results  Component Value Date   INR 0.97 12/03/2014   LABPROT 13.1 12/03/2014   APTT 32 12/03/2014   PLT 346 07/31/2016   Cardiovascular Markers Lab Results  Component Value Date   HGB 13.4 07/31/2016   HCT 39.8 07/31/2016   Note: Lab results reviewed.  Recent Diagnostic Imaging Review  Dg Cervical Spine Complete Result Date: 08/04/2016 CLINICAL DATA:  Many years of neck pain. Possible previous cervical fusion. EXAM: CERVICAL SPINE - COMPLETE 4+ VIEW COMPARISON:  None in PACs FINDINGS: The cervical vertebral bodies are preserved in height. There is mild degenerative disc space narrowing at C4-5 and C5-6 with anterior and posterior endplate osteophytes noted at these levels. There is no perched facet or spinous process fracture. The oblique views rib reveal mild bilateral bony encroachment upon the neural foramina in the lower cervical spine bilaterally. The odontoid is intact. The prevertebral soft tissue spaces are normal. IMPRESSION: Degenerative disc disease centered at C4-5 and C5-6. No compression fracture or spondylolisthesis. Mild bony encroachment upon the neural foramina bilaterally at multiple lower cervical levels. Electronically Signed   By: David  Martinique M.D.   On: 08/04/2016 15:09   Note: Results of ordered imaging test(s) reviewed and explained to patient in Layman's terms.  Meds  The patient has a current medication list which includes the following prescription(s): acetaminophen, calcium citrate-vitamin d, cholecalciferol, ferrous sulfate, multiple vitamins/womens, vitamin b-12, vitamin c, and zinc.  Current  Outpatient Prescriptions on File Prior to Visit  Medication Sig  . acetaminophen (TYLENOL) 325 MG tablet Take by mouth.  . calcium citrate-vitamin D (CITRACAL+D) 315-200 MG-UNIT tablet Take 1 tablet by mouth daily.  . cholecalciferol (VITAMIN D) 1000 units tablet Take 1,000 Units by mouth daily.  . Ferrous Sulfate (IRON SUPPLEMENT PO) Take 1 tablet by mouth every other day.  . Multiple Vitamins-Minerals (MULTIPLE VITAMINS/WOMENS) tablet Take 1 tablet by mouth daily.  . vitamin B-12 (CYANOCOBALAMIN) 500 MCG tablet Take 500 mcg by mouth daily.  . vitamin C (ASCORBIC ACID) 500 MG tablet Take 500 mg by mouth daily.  . Zinc 50 MG TABS Take 1 tablet by mouth daily.   No current facility-administered medications on file prior to visit.    ROS  Constitutional: Denies any fever or chills Gastrointestinal: No reported hemesis, hematochezia, vomiting, or acute GI distress Musculoskeletal: Denies any acute onset joint swelling, redness, loss of ROM, or weakness Neurological: No reported episodes of acute onset apraxia, aphasia, dysarthria, agnosia, amnesia, paralysis, loss of coordination, or loss of consciousness  Allergies  Ms. Carnathan is allergic to oxycodone-acetaminophen; nsaids; other; and percocet [oxycodone-acetaminophen].  PFSH  Drug: Ms. Sann  reports that she does not use drugs. Alcohol:  reports that she does not drink alcohol. Tobacco:  reports that she quit smoking about 19 months ago. Her smoking use included Cigarettes. She has a 14.50 pack-year smoking history. She has never used smokeless tobacco. Medical:  has a past medical history of Anxiety; Arthritis; Fibromyalgia; Hypertension; Neuromuscular disorder ([place]); and Sleep apnea. Family: family history includes COPD in her father; Diabetes in her paternal grandfather; Eczema in her son; Heart disease in her maternal grandfather and paternal grandfather; Thyroid disease in her daughter.  Past Surgical History:  Procedure Laterality  Date  . DILATION AND CURETTAGE OF UTERUS    . GANGLION CYST EXCISION Bilateral   . GANGLION CYST EXCISION Right 10/08/2015   Procedure: REMOVAL GANGLION OF WRIST;  Surgeon: Hessie Knows, MD;  Location: [facility] ORS;  Service: Orthopedics;  Laterality: Right;  . GASTRIC BYPASS    . HAND SURGERY Bilateral Resection  . TUBAL LIGATION     Constitutional Exam  General appearance: Well nourished, well developed, and well hydrated. In no apparent acute distress Vitals:   09/17/16 1057  BP: 123/76  Pulse: 72  Resp: 16  Temp: 97.7 F (36.5 C)  SpO2: 98%  Weight: 265 lb (120.2 kg)  Height: _0  (1.727 m)   BMI Assessment: Estimated body mass index is 40.29 kg/m as calculated from the following:   Height as of this encounter: _1  (1.727 m).   Weight as of this encounter: 265 lb (120.2 kg).  BMI interpretation table: BMI level Category Range association with higher incidence of chronic pain  <18 kg/m2 Underweight   18.5-24.9 kg/m2 Ideal body weight   25-29.9 kg/m2 Overweight Increased incidence by 20%  30-34.9 kg/m2 Obese (Class I) Increased incidence by 68%  35-39.9 kg/m2 Severe obesity (Class II) Increased incidence by 136%  >40 kg/m2 Extreme obesity (Class III) Increased incidence by 254%   BMI Readings from Last 4 Encounters:  09/17/16 40.29 kg/m  08/31/16 41.51 kg/m  08/27/16 41.51 kg/m  08/04/16 41.51 kg/m   Wt Readings from Last 4 Encounters:  09/17/16 265 lb (120.2 kg)  08/31/16 273 lb (123.8 kg)  08/27/16 273 lb (123.8 kg)  08/04/16 273 lb (123.8 kg)  Psych/Mental status: Alert, oriented x 3 (person, place, & time)       Eyes: PERLA Respiratory: No evidence of acute respiratory distress  Cervical Spine Exam  Inspection: No masses, redness, or swelling Alignment: Symmetrical Functional ROM: Decreased ROM      Stability: No instability detected Muscle strength & Tone: Functionally intact Sensory: Movement-associated discomfort Palpation: No palpable anomalies               Upper Extremity (UE) Exam    Side: Right upper extremity  Side: Left upper extremity  Inspection: No masses, redness, swelling, or asymmetry. No contractures  Inspection: No masses, redness, swelling, or asymmetry. No contractures  Functional ROM: Unrestricted ROM          Functional ROM: Unrestricted ROM          Muscle strength & Tone: Functionally intact  Muscle strength & Tone: Functionally intact  Sensory: Unimpaired  Sensory: Unimpaired  Palpation: No palpable anomalies              Palpation: No palpable anomalies              Specialized Test(s): Deferred         Specialized Test(s): Deferred          Thoracic Spine Exam  Inspection: No masses, redness, or swelling Alignment: Symmetrical Functional ROM: Unrestricted ROM Stability: No instability detected Sensory: Unimpaired Muscle strength & Tone: No palpable anomalies  Lumbar Spine Exam  Inspection: No masses, redness, or swelling Alignment: Symmetrical Functional ROM: Unrestricted ROM      Stability: No instability detected Muscle strength & Tone: Functionally intact Sensory: Unimpaired Palpation: No palpable anomalies       Provocative Tests: Lumbar Hyperextension and rotation test: evaluation deferred today       Patrick's Maneuver: evaluation deferred today                    Gait & Posture Assessment  Ambulation: Unassisted Gait: Relatively normal for age and body habitus Posture: WNL   Lower Extremity Exam    Side: Right lower extremity  Side: Left lower extremity  Inspection: No masses, redness, swelling, or asymmetry. No contractures  Inspection: No masses, redness, swelling, or asymmetry. No contractures  Functional ROM: Unrestricted ROM          Functional ROM: Unrestricted ROM          Muscle strength & Tone: Functionally intact  Muscle strength & Tone: Functionally intact  Sensory: Unimpaired  Sensory: Unimpaired  Palpation: No palpable anomalies  Palpation: No palpable anomalies   Assessment   Primary Diagnosis & Pertinent Problem List: The primary encounter diagnosis was Chronic knee pain (Location of Primary Source of Pain) (Bilateral) (R>L). Diagnoses of Chronic neck pain (Location of Secondary source of pain) (Bilateral), Chronic hip pain (Location of Tertiary source of pain) (Right), Osteoarthritis of knee (Bilateral) (R>L), Long term prescription opiate use, and Opiate use were also pertinent to this visit.  Status Diagnosis  Unimproved Stable Stable 1. Chronic knee pain (Location of Primary Source of Pain) (Bilateral) (R>L)   2. Chronic neck pain (Location of Secondary source of pain) (Bilateral)   3. Chronic hip pain (Location of Tertiary source of pain) (Right)   4. Osteoarthritis of knee (Bilateral) (R>L)   5. Long term prescription opiate use   6. Opiate use     Problems updated and  reviewed during this visit: No problems updated. Plan of Care  Pharmacotherapy (Medications Ordered): No orders of the defined types were placed in this encounter.  New Prescriptions   No medications on file   Medications administered today: Ms. Renninger had no medications administered during this visit. Lab-work, procedure(s), and/or referral(s): Orders Placed This Encounter  Procedures  . KNEE INJECTION   Imaging and/or referral(s): None  Interventional therapies: Planned, scheduled, and/or pending:   Therapeutic bilateral intra-articular hyalgan knee injection #1   Considering:   Diagnostic bilateral intra-articular knee joint injection with local anesthetic and steroid  Series of 5 intra-articular Hyalgan knee injections, bilaterally  Diagnostic bilateral Genicular nerve block  Possible bilateral Genicular RFA  Diagnostic cervical epidural steroid injection  Diagnostic bilateral cervical facet block  Possible bilateral cervical facet RFA  Diagnostic right intra-articular hip joint injection  Diagnostic right femoral and obturator nerve block  Possible right femoral  and obturator nerve RFA  Diagnostic bilateral occipital nerve block  Possible bilateral occipital nerve RFA  Diagnostic bilateral C2 + TON nerve block  Possible bilateral C2 + TON RFA  Possible bilateral occipital peripheral nerve stimulator trial    Palliative PRN treatment(s):   To be determined at a later time   Provider-requested follow-up: Return for NS procedure, (ASAP), w/ MD.  Future Appointments Date Time Provider [place]  09/23/2016 7:45 AM Milinda Pointer, MD [facility] None   Primary Care Physician: Kathrine Haddock, NP Location: [facility] Note by: Kathlen Brunswick. Dossie Arbour, M.D, DABA, DABAPM, DABPM, DABIPP, FIPP Date: 09/17/2016; Time: 12:26 PM  Patient instructions provided during this appointment: Patient Instructions   ____________________________________________________________________________________________  Preparing for your procedure (without sedation) Instructions: . Oral Intake: Do not eat or drink anything for at least 3 hours prior to your procedure. . Transportation: Unless otherwise stated by your physician, you may drive yourself after the procedure. . Blood Pressure Medicine: Take your blood pressure medicine with a sip of water the morning of the procedure. . Blood thinners:  . Diabetics on insulin: Notify the staff so that you can be scheduled 1st case in the morning. If your diabetes requires high dose insulin, take only  of your normal insulin dose the morning of the procedure and notify the staff that you have done so. . Preventing infections: Shower with an antibacterial soap the morning of your procedure.  . Build-up your immune system: Take 1000 mg of Vitamin C with every meal (3 times a day) the day prior to your procedure. Marland Kitchen Antibiotics: Inform the staff if you have a condition or reason that requires you to take antibiotics before dental procedures. . Pregnancy: If you are pregnant, call and cancel the  procedure. . Sickness: If you have a cold, fever, or any active infections, call and cancel the procedure. . Arrival: You must be in the facility at least 30 minutes prior to your scheduled procedure. . Children: Do not bring any children with you. . Dress appropriately: Bring dark clothing that you would not mind if they get stained. . Valuables: Do not bring any jewelry or valuables. Procedure appointments are reserved for interventional treatments only. Marland Kitchen No Prescription Refills. . No medication changes will be discussed during procedure appointments. . No disability issues will be discussed. ____________________________________________________________________________________________  Pain Management Discharge Instructions  General Discharge Instructions :  If you need to reach your doctor call: Monday-Friday 8:00 am - 4:00 pm at [phone] or toll free [phone].  After clinic hours [phone] to have operator  reach doctor.  Bring all of your medication bottles to all your appointments in the pain clinic.  To cancel or reschedule your appointment with Pain Management please remember to call 24 hours in advance to avoid a fee.  Refer to the educational materials which you have been given on: General Risks, I had my Procedure. Discharge Instructions, Post Sedation.  Post Procedure Instructions:  The drugs you were given will stay in your system until tomorrow, so for the next 24 hours you should not drive, make any legal decisions or drink any alcoholic beverages.  You may eat anything you prefer, but it is better to start with liquids then soups and crackers, and gradually work up to solid foods.  Please notify your doctor immediately if you have any unusual bleeding, trouble breathing or pain that is not related to your normal pain.  Depending on the type of procedure that was done, some parts of your body may feel week and/or numb.  This usually clears up by tonight or  the next day.  Walk with the use of an assistive device or accompanied by an adult for the 24 hours.  You may use ice on the affected area for the first 24 hours.  Put ice in a Ziploc bag and cover with a towel and place against area 15 minutes on 15 minutes off.  You may switch to heat after 24 hours. Knee Injection A knee injection is a procedure to get medicine into your knee joint. Your health care provider puts a needle into the joint and injects medicine with an attached syringe. The injected medicine may relieve the pain, swelling, and stiffness of arthritis. The injected medicine may also help to lubricate and cushion your knee joint. You may need more than one injection. Tell a health care provider about:  Any allergies you have.  All medicines you are taking, including vitamins, herbs, eye drops, creams, and over-the-counter medicines.  Any problems you or family members have had with anesthetic medicines.  Any blood disorders you have.  Any surgeries you have had.  Any medical conditions you have. What are the risks? Generally, this is a safe procedure. However, problems may occur, including:  Infection.  Bleeding.  Worsening symptoms.  Damage to the area around your knee.  Allergic reaction to any of the medicines.  Skin reactions from repeated injections.  What happens before the procedure?  Ask your health care provider about changing or stopping your regular medicines. This is especially important if you are taking diabetes medicines or blood thinners.  Plan to have someone take you home after the procedure. What happens during the procedure?  You will sit or lie down in a position for your knee to be treated.  The skin over your kneecap will be cleaned with a germ-killing solution (antiseptic).  You will be given a medicine that numbs the area (local anesthetic). You may feel some stinging.  After your knee becomes numb, you will have a second  injection. This is the medicine. This needle is carefully placed between your kneecap and your knee. The medicine is injected into the joint space.  At the end of the procedure, the needle will be removed.  A bandage (dressing) may be placed over the injection site. The procedure may vary among health care providers and hospitals. What happens after the procedure?  You may have to move your knee through its full range of motion. This helps to get all of the medicine into your  joint space.  Your blood pressure, heart rate, breathing rate, and blood oxygen level will be monitored often until the medicines you were given have worn off.  You will be watched to make sure that you do not have a reaction to the injected medicine. This information is not intended to replace advice given to you by your health care provider. Make sure you discuss any questions you have with your health care provider. Document Released: 06/07/2006 Document Revised: 08/16/2015 Document Reviewed: 01/24/2014 Elsevier Interactive Patient Education  2018 [employer]  What are the risk, side effects and possible complications? Generally speaking, most procedures are safe.  However, with any procedure there are risks, side effects, and the possibility of complications.  The risks and complications are dependent upon the sites that are lesioned, or the type of nerve block to be performed.  The closer the procedure is to the spine, the more serious the risks are.  Great care is taken when placing the radio frequency needles, block needles or lesioning probes, but sometimes complications can occur. 1. Infection: Any time there is an injection through the skin, there is a risk of infection.  This is why sterile conditions are used for these blocks.  There are four possible types of infection. 1. Localized skin infection. 2. Central Nervous System Infection-This can be in the form of Meningitis,  which can be deadly. 3. Epidural Infections-This can be in the form of an epidural abscess, which can cause pressure inside of the spine, causing compression of the spinal cord with subsequent paralysis. This would require an emergency surgery to decompress, and there are no guarantees that the patient would recover from the paralysis. 4. Discitis-This is an infection of the intervertebral discs.  It occurs in about 1% of discography procedures.  It is difficult to treat and it may lead to surgery.        2. Pain: the needles have to go through skin and soft tissues, will cause soreness.       3. Damage to internal structures:  The nerves to be lesioned may be near blood vessels or    other nerves which can be potentially damaged.       4. Bleeding: Bleeding is more common if the patient is taking blood thinners such as  aspirin, Coumadin, Ticiid, Plavix, etc., or if he/she have some genetic predisposition  such as hemophilia. Bleeding into the spinal canal can cause compression of the spinal  cord with subsequent paralysis.  This would require an emergency surgery to  decompress and there are no guarantees that the patient would recover from the  paralysis.       5. Pneumothorax:  Puncturing of a lung is a possibility, every time a needle is introduced in  the area of the chest or upper back.  Pneumothorax refers to free air around the  collapsed lung(s), inside of the thoracic cavity (chest cavity).  Another two possible  complications related to a similar event would include: Hemothorax and Chylothorax.   These are variations of the Pneumothorax, where instead of air around the collapsed  lung(s), you may have blood or chyle, respectively.       6. Spinal headaches: They may occur with any procedures in the area of the spine.       7. Persistent CSF (Cerebro-Spinal Fluid) leakage: This is a rare problem, but may occur  with prolonged intrathecal or epidural catheters either due to the formation of a  fistulous  track or a dural tear.       8. Nerve damage: By working so close to the spinal cord, there is always a possibility of  nerve damage, which could be as serious as a permanent spinal cord injury with  paralysis.       9. Death:  Although rare, severe deadly allergic reactions known as "Anaphylactic  reaction" can occur to any of the medications used.      10. Worsening of the symptoms:  We can always make thing worse.  What are the chances of something like this happening? Chances of any of this occuring are extremely low.  By statistics, you have more of a chance of getting killed in a motor vehicle accident: while driving to the hospital than any of the above occurring .  Nevertheless, you should be aware that they are possibilities.  In general, it is similar to taking a shower.  Everybody knows that you can slip, hit your head and get killed.  Does that mean that you should not shower again?  Nevertheless always keep in mind that statistics do not mean anything if you happen to be on the wrong side of them.  Even if a procedure has a 1 (one) in a 1,000,000 (million) chance of going wrong, it you happen to be that one..Also, keep in mind that by statistics, you have more of a chance of having something go wrong when taking medications.  Who should not have this procedure? If you are on a blood thinning medication (e.g. Coumadin, Plavix, see list of "Blood Thinners"), or if you have an active infection going on, you should not have the procedure.  If you are taking any blood thinners, please inform your physician.  How should I prepare for this procedure?  Do not eat or drink anything at least six hours prior to the procedure.  Bring a driver with you .  It cannot be a taxi.  Come accompanied by an adult that can drive you back, and that is strong enough to help you if your legs get weak or numb from the local anesthetic.  Take all of your medicines the morning of the procedure with  just enough water to swallow them.  If you have diabetes, make sure that you are scheduled to have your procedure done first thing in the morning, whenever possible.  If you have diabetes, take only half of your insulin dose and notify our nurse that you have done so as soon as you arrive at the clinic.  If you are diabetic, but only take blood sugar pills (oral hypoglycemic), then do not take them on the morning of your procedure.  You may take them after you have had the procedure.  Do not take aspirin or any aspirin-containing medications, at least eleven (11) days prior to the procedure.  They may prolong bleeding.  Wear loose fitting clothing that may be easy to take off and that you would not mind if it got stained with Betadine or blood.  Do not wear any jewelry or perfume  Remove any nail coloring.  It will interfere with some of our monitoring equipment.  NOTE: Remember that this is not meant to be interpreted as a complete list of all possible complications.  Unforeseen problems may occur.  BLOOD THINNERS The following drugs contain aspirin or other products, which can cause increased bleeding during surgery and should not be taken for 2 weeks prior to and 1 week after  surgery.  If you should need take something for relief of minor pain, you may take acetaminophen which is found in Tylenol,m Datril, Anacin-3 and Panadol. It is not blood thinner. The products listed below are.  Do not take any of the products listed below in addition to any listed on your instruction sheet.  A.P.C or A.P.C with Codeine Codeine Phosphate Capsules #3 Ibuprofen Ridaura  ABC compound Congesprin Imuran rimadil  Advil Cope Indocin Robaxisal  Alka-Seltzer Effervescent Pain Reliever and Antacid Coricidin or Coricidin-D  Indomethacin Rufen  Alka-Seltzer plus Cold Medicine Cosprin Ketoprofen S-A-C Tablets  Anacin Analgesic Tablets or Capsules Coumadin Korlgesic Salflex  Anacin Extra Strength Analgesic  tablets or capsules CP-2 Tablets Lanoril Salicylate  Anaprox Cuprimine Capsules Levenox Salocol  Anexsia-D Dalteparin Magan Salsalate  Anodynos Darvon compound Magnesium Salicylate Sine-off  Ansaid Dasin Capsules Magsal Sodium Salicylate  Anturane Depen Capsules Marnal Soma  APF Arthritis pain formula Dewitt's Pills Measurin Stanback  Argesic Dia-Gesic Meclofenamic Sulfinpyrazone  Arthritis Bayer Timed Release Aspirin Diclofenac Meclomen Sulindac  Arthritis pain formula Anacin Dicumarol Medipren Supac  Analgesic (Safety coated) Arthralgen Diffunasal Mefanamic Suprofen  Arthritis Strength Bufferin Dihydrocodeine Mepro Compound Suprol  Arthropan liquid Dopirydamole Methcarbomol with Aspirin Synalgos  ASA tablets/Enseals Disalcid Micrainin Tagament  Ascriptin Doan's Midol Talwin  Ascriptin A/D Dolene Mobidin Tanderil  Ascriptin Extra Strength Dolobid Moblgesic Ticlid  Ascriptin with Codeine Doloprin or Doloprin with Codeine Momentum Tolectin  Asperbuf Duoprin Mono-gesic Trendar  Aspergum Duradyne Motrin or Motrin IB Triminicin  Aspirin plain, buffered or enteric coated Durasal Myochrisine Trigesic  Aspirin Suppositories Easprin Nalfon Trillsate  Aspirin with Codeine Ecotrin Regular or Extra Strength Naprosyn Uracel  Atromid-S Efficin Naproxen Ursinus  Auranofin Capsules Elmiron Neocylate Vanquish  Axotal Emagrin Norgesic Verin  Azathioprine Empirin or Empirin with Codeine Normiflo Vitamin E  Azolid Emprazil Nuprin Voltaren  Bayer Aspirin plain, buffered or children's or timed BC Tablets or powders Encaprin Orgaran Warfarin Sodium  Buff-a-Comp Enoxaparin Orudis Zorpin  Buff-a-Comp with Codeine Equegesic Os-Cal-Gesic   Buffaprin Excedrin plain, buffered or Extra Strength Oxalid   Bufferin Arthritis Strength Feldene Oxphenbutazone   Bufferin plain or Extra Strength Feldene Capsules Oxycodone with Aspirin   Bufferin with Codeine Fenoprofen Fenoprofen Pabalate or Pabalate-SF   Buffets II  Flogesic Panagesic   Buffinol plain or Extra Strength Florinal or Florinal with Codeine Panwarfarin   Buf-Tabs Flurbiprofen Penicillamine   Butalbital Compound Four-way cold tablets Penicillin   Butazolidin Fragmin Pepto-Bismol   Carbenicillin Geminisyn Percodan   Carna Arthritis Reliever Geopen Persantine   Carprofen Gold's salt Persistin   Chloramphenicol Goody's Phenylbutazone   Chloromycetin Haltrain Piroxlcam   Clmetidine heparin Plaquenil   Cllnoril Hyco-pap Ponstel   Clofibrate Hydroxy chloroquine Propoxyphen         Before stopping any of these medications, be sure to consult the physician who ordered them.  Some, such as Coumadin (Warfarin) are ordered to prevent or treat serious conditions such as "deep thrombosis", "pumonary embolisms", and other heart problems.  The amount of time that you may need off of the medication may also vary with the medication and the reason for which you were taking it.  If you are taking any of these medications, please make sure you notify your pain physician before you undergo any procedures.

## 2016-09-17 NOTE — Patient Instructions (Addendum)
____________________________________________________________________________________________  Preparing for your procedure (without sedation) Instructions: . Oral Intake: Do not eat or drink anything for at least 3 hours prior to your procedure. . Transportation: Unless otherwise stated by your physician, you may drive yourself after the procedure. . Blood Pressure Medicine: Take your blood pressure medicine with a sip of water the morning of the procedure. . Blood thinners:  . Diabetics on insulin: Notify the staff so that you can be scheduled 1st case in the morning. If your diabetes requires high dose insulin, take only  of your normal insulin dose the morning of the procedure and notify the staff that you have done so. . Preventing infections: Shower with an antibacterial soap the morning of your procedure.  . Build-up your immune system: Take 1000 mg of Vitamin C with every meal (3 times a day) the day prior to your procedure. . Antibiotics: Inform the staff if you have a condition or reason that requires you to take antibiotics before dental procedures. . Pregnancy: If you are pregnant, call and cancel the procedure. . Sickness: If you have a cold, fever, or any active infections, call and cancel the procedure. . Arrival: You must be in the facility at least 30 minutes prior to your scheduled procedure. . Children: Do not bring any children with you. . Dress appropriately: Bring dark clothing that you would not mind if they get stained. . Valuables: Do not bring any jewelry or valuables. Procedure appointments are reserved for interventional treatments only. . No Prescription Refills. . No medication changes will be discussed during procedure appointments. . No disability issues will be discussed. ____________________________________________________________________________________________  Pain Management Discharge Instructions  General Discharge Instructions :  If you need to  reach your doctor call: Monday-Friday 8:00 am - 4:00 pm at [phone] or toll free [phone].  After clinic hours [phone] to have operator reach doctor.  Bring all of your medication bottles to all your appointments in the pain clinic.  To cancel or reschedule your appointment with Pain Management please remember to call 24 hours in advance to avoid a fee.  Refer to the educational materials which you have been given on: General Risks, I had my Procedure. Discharge Instructions, Post Sedation.  Post Procedure Instructions:  The drugs you were given will stay in your system until tomorrow, so for the next 24 hours you should not drive, make any legal decisions or drink any alcoholic beverages.  You may eat anything you prefer, but it is better to start with liquids then soups and crackers, and gradually work up to solid foods.  Please notify your doctor immediately if you have any unusual bleeding, trouble breathing or pain that is not related to your normal pain.  Depending on the type of procedure that was done, some parts of your body may feel week and/or numb.  This usually clears up by tonight or the next day.  Walk with the use of an assistive device or accompanied by an adult for the 24 hours.  You may use ice on the affected area for the first 24 hours.  Put ice in a Ziploc bag and cover with a towel and place against area 15 minutes on 15 minutes off.  You may switch to heat after 24 hours. Knee Injection A knee injection is a procedure to get medicine into your knee joint. Your health care provider puts a needle into the joint and injects medicine with an attached syringe. The injected medicine may relieve the pain, swelling,   and stiffness of arthritis. The injected medicine may also help to lubricate and cushion your knee joint. You may need more than one injection. Tell a health care provider about:  Any allergies you have.  All medicines you are taking, including  vitamins, herbs, eye drops, creams, and over-the-counter medicines.  Any problems you or family members have had with anesthetic medicines.  Any blood disorders you have.  Any surgeries you have had.  Any medical conditions you have. What are the risks? Generally, this is a safe procedure. However, problems may occur, including:  Infection.  Bleeding.  Worsening symptoms.  Damage to the area around your knee.  Allergic reaction to any of the medicines.  Skin reactions from repeated injections.  What happens before the procedure?  Ask your health care provider about changing or stopping your regular medicines. This is especially important if you are taking diabetes medicines or blood thinners.  Plan to have someone take you home after the procedure. What happens during the procedure?  You will sit or lie down in a position for your knee to be treated.  The skin over your kneecap will be cleaned with a germ-killing solution (antiseptic).  You will be given a medicine that numbs the area (local anesthetic). You may feel some stinging.  After your knee becomes numb, you will have a second injection. This is the medicine. This needle is carefully placed between your kneecap and your knee. The medicine is injected into the joint space.  At the end of the procedure, the needle will be removed.  A bandage (dressing) may be placed over the injection site. The procedure may vary among health care providers and hospitals. What happens after the procedure?  You may have to move your knee through its full range of motion. This helps to get all of the medicine into your joint space.  Your blood pressure, heart rate, breathing rate, and blood oxygen level will be monitored often until the medicines you were given have worn off.  You will be watched to make sure that you do not have a reaction to the injected medicine. This information is not intended to replace advice given to you  by your health care provider. Make sure you discuss any questions you have with your health care provider. Document Released: 06/07/2006 Document Revised: 08/16/2015 Document Reviewed: 01/24/2014 Elsevier Interactive Patient Education  2018 [employer]  What are the risk, side effects and possible complications? Generally speaking, most procedures are safe.  However, with any procedure there are risks, side effects, and the possibility of complications.  The risks and complications are dependent upon the sites that are lesioned, or the type of nerve block to be performed.  The closer the procedure is to the spine, the more serious the risks are.  Great care is taken when placing the radio frequency needles, block needles or lesioning probes, but sometimes complications can occur. 1. Infection: Any time there is an injection through the skin, there is a risk of infection.  This is why sterile conditions are used for these blocks.  There are four possible types of infection. 1. Localized skin infection. 2. Central Nervous System Infection-This can be in the form of Meningitis, which can be deadly. 3. Epidural Infections-This can be in the form of an epidural abscess, which can cause pressure inside of the spine, causing compression of the spinal cord with subsequent paralysis. This would require an emergency surgery to decompress, and there are  no guarantees that the patient would recover from the paralysis. 4. Discitis-This is an infection of the intervertebral discs.  It occurs in about 1% of discography procedures.  It is difficult to treat and it may lead to surgery.        2. Pain: the needles have to go through skin and soft tissues, will cause soreness.       3. Damage to internal structures:  The nerves to be lesioned may be near blood vessels or    other nerves which can be potentially damaged.       4. Bleeding: Bleeding is more common if the patient is  taking blood thinners such as  aspirin, Coumadin, Ticiid, Plavix, etc., or if he/she have some genetic predisposition  such as hemophilia. Bleeding into the spinal canal can cause compression of the spinal  cord with subsequent paralysis.  This would require an emergency surgery to  decompress and there are no guarantees that the patient would recover from the  paralysis.       5. Pneumothorax:  Puncturing of a lung is a possibility, every time a needle is introduced in  the area of the chest or upper back.  Pneumothorax refers to free air around the  collapsed lung(s), inside of the thoracic cavity (chest cavity).  Another two possible  complications related to a similar event would include: Hemothorax and Chylothorax.   These are variations of the Pneumothorax, where instead of air around the collapsed  lung(s), you may have blood or chyle, respectively.       6. Spinal headaches: They may occur with any procedures in the area of the spine.       7. Persistent CSF (Cerebro-Spinal Fluid) leakage: This is a rare problem, but may occur  with prolonged intrathecal or epidural catheters either due to the formation of a fistulous  track or a dural tear.       8. Nerve damage: By working so close to the spinal cord, there is always a possibility of  nerve damage, which could be as serious as a permanent spinal cord injury with  paralysis.       9. Death:  Although rare, severe deadly allergic reactions known as "Anaphylactic  reaction" can occur to any of the medications used.      10. Worsening of the symptoms:  We can always make thing worse.  What are the chances of something like this happening? Chances of any of this occuring are extremely low.  By statistics, you have more of a chance of getting killed in a motor vehicle accident: while driving to the hospital than any of the above occurring .  Nevertheless, you should be aware that they are possibilities.  In general, it is similar to taking a shower.   Everybody knows that you can slip, hit your head and get killed.  Does that mean that you should not shower again?  Nevertheless always keep in mind that statistics do not mean anything if you happen to be on the wrong side of them.  Even if a procedure has a 1 (one) in a 1,000,000 (million) chance of going wrong, it you happen to be that one..Also, keep in mind that by statistics, you have more of a chance of having something go wrong when taking medications.  Who should not have this procedure? If you are on a blood thinning medication (e.g. Coumadin, Plavix, see list of "Blood Thinners"), or if you have an active infection  going on, you should not have the procedure.  If you are taking any blood thinners, please inform your physician.  How should I prepare for this procedure?  Do not eat or drink anything at least six hours prior to the procedure.  Bring a driver with you .  It cannot be a taxi.  Come accompanied by an adult that can drive you back, and that is strong enough to help you if your legs get weak or numb from the local anesthetic.  Take all of your medicines the morning of the procedure with just enough water to swallow them.  If you have diabetes, make sure that you are scheduled to have your procedure done first thing in the morning, whenever possible.  If you have diabetes, take only half of your insulin dose and notify our nurse that you have done so as soon as you arrive at the clinic.  If you are diabetic, but only take blood sugar pills (oral hypoglycemic), then do not take them on the morning of your procedure.  You may take them after you have had the procedure.  Do not take aspirin or any aspirin-containing medications, at least eleven (11) days prior to the procedure.  They may prolong bleeding.  Wear loose fitting clothing that may be easy to take off and that you would not mind if it got stained with Betadine or blood.  Do not wear any jewelry or perfume  Remove  any nail coloring.  It will interfere with some of our monitoring equipment.  NOTE: Remember that this is not meant to be interpreted as a complete list of all possible complications.  Unforeseen problems may occur.  BLOOD THINNERS The following drugs contain aspirin or other products, which can cause increased bleeding during surgery and should not be taken for 2 weeks prior to and 1 week after surgery.  If you should need take something for relief of minor pain, you may take acetaminophen which is found in Tylenol,m Datril, Anacin-3 and Panadol. It is not blood thinner. The products listed below are.  Do not take any of the products listed below in addition to any listed on your instruction sheet.  A.P.C or A.P.C with Codeine Codeine Phosphate Capsules #3 Ibuprofen Ridaura  ABC compound Congesprin Imuran rimadil  Advil Cope Indocin Robaxisal  Alka-Seltzer Effervescent Pain Reliever and Antacid Coricidin or Coricidin-D  Indomethacin Rufen  Alka-Seltzer plus Cold Medicine Cosprin Ketoprofen S-A-C Tablets  Anacin Analgesic Tablets or Capsules Coumadin Korlgesic Salflex  Anacin Extra Strength Analgesic tablets or capsules CP-2 Tablets Lanoril Salicylate  Anaprox Cuprimine Capsules Levenox Salocol  Anexsia-D Dalteparin Magan Salsalate  Anodynos Darvon compound Magnesium Salicylate Sine-off  Ansaid Dasin Capsules Magsal Sodium Salicylate  Anturane Depen Capsules Marnal Soma  APF Arthritis pain formula Dewitt's Pills Measurin Stanback  Argesic Dia-Gesic Meclofenamic Sulfinpyrazone  Arthritis Bayer Timed Release Aspirin Diclofenac Meclomen Sulindac  Arthritis pain formula Anacin Dicumarol Medipren Supac  Analgesic (Safety coated) Arthralgen Diffunasal Mefanamic Suprofen  Arthritis Strength Bufferin Dihydrocodeine Mepro Compound Suprol  Arthropan liquid Dopirydamole Methcarbomol with Aspirin Synalgos  ASA tablets/Enseals Disalcid Micrainin Tagament  Ascriptin Doan's Midol Talwin  Ascriptin A/D  Dolene Mobidin Tanderil  Ascriptin Extra Strength Dolobid Moblgesic Ticlid  Ascriptin with Codeine Doloprin or Doloprin with Codeine Momentum Tolectin  Asperbuf Duoprin Mono-gesic Trendar  Aspergum Duradyne Motrin or Motrin IB Triminicin  Aspirin plain, buffered or enteric coated Durasal Myochrisine Trigesic  Aspirin Suppositories Easprin Nalfon Trillsate  Aspirin with Codeine Ecotrin Regular or Extra  Strength Naprosyn Uracel  Atromid-S Efficin Naproxen Ursinus  Auranofin Capsules Elmiron Neocylate Vanquish  Axotal Emagrin Norgesic Verin  Azathioprine Empirin or Empirin with Codeine Normiflo Vitamin E  Azolid Emprazil Nuprin Voltaren  Bayer Aspirin plain, buffered or children's or timed BC Tablets or powders Encaprin Orgaran Warfarin Sodium  Buff-a-Comp Enoxaparin Orudis Zorpin  Buff-a-Comp with Codeine Equegesic Os-Cal-Gesic   Buffaprin Excedrin plain, buffered or Extra Strength Oxalid   Bufferin Arthritis Strength Feldene Oxphenbutazone   Bufferin plain or Extra Strength Feldene Capsules Oxycodone with Aspirin   Bufferin with Codeine Fenoprofen Fenoprofen Pabalate or Pabalate-SF   Buffets II Flogesic Panagesic   Buffinol plain or Extra Strength Florinal or Florinal with Codeine Panwarfarin   Buf-Tabs Flurbiprofen Penicillamine   Butalbital Compound Four-way cold tablets Penicillin   Butazolidin Fragmin Pepto-Bismol   Carbenicillin Geminisyn Percodan   Carna Arthritis Reliever Geopen Persantine   Carprofen Gold's salt Persistin   Chloramphenicol Goody's Phenylbutazone   Chloromycetin Haltrain Piroxlcam   Clmetidine heparin Plaquenil   Cllnoril Hyco-pap Ponstel   Clofibrate Hydroxy chloroquine Propoxyphen         Before stopping any of these medications, be sure to consult the physician who ordered them.  Some, such as Coumadin (Warfarin) are ordered to prevent or treat serious conditions such as "deep thrombosis", "pumonary embolisms", and other heart problems.  The amount of time  that you may need off of the medication may also vary with the medication and the reason for which you were taking it.  If you are taking any of these medications, please make sure you notify your pain physician before you undergo any procedures.

## 2016-09-23 ENCOUNTER — Encounter: Payer: Self-pay | Admitting: Pain Medicine

## 2016-09-23 ENCOUNTER — Ambulatory Visit: Payer: BLUE CROSS/BLUE SHIELD | Attending: Pain Medicine | Admitting: Pain Medicine

## 2016-09-23 VITALS — BP 155/88 | HR 68 | Temp 97.9°F | Resp 16 | Ht 68.0 in | Wt 265.0 lb

## 2016-09-23 DIAGNOSIS — Z885 Allergy status to narcotic agent status: Secondary | ICD-10-CM | POA: Insufficient documentation

## 2016-09-23 DIAGNOSIS — M25562 Pain in left knee: Secondary | ICD-10-CM

## 2016-09-23 DIAGNOSIS — M25561 Pain in right knee: Secondary | ICD-10-CM

## 2016-09-23 DIAGNOSIS — G8929 Other chronic pain: Secondary | ICD-10-CM

## 2016-09-23 DIAGNOSIS — M170 Bilateral primary osteoarthritis of knee: Secondary | ICD-10-CM | POA: Diagnosis not present

## 2016-09-23 MED ORDER — ROPIVACAINE HCL 2 MG/ML IJ SOLN
INTRAMUSCULAR | Status: AC
  Filled 2016-09-23: qty 10

## 2016-09-23 MED ORDER — ROPIVACAINE HCL 2 MG/ML IJ SOLN
2.0000 mL | Freq: Once | INTRAMUSCULAR | Status: AC
  Administered 2016-09-23: 10 mL via INTRA_ARTICULAR

## 2016-09-23 MED ORDER — SODIUM HYALURONATE (VISCOSUP) 20 MG/2ML IX SOSY
2.0000 mL | PREFILLED_SYRINGE | Freq: Once | INTRA_ARTICULAR | Status: AC
  Administered 2016-09-23: 2 mL via INTRA_ARTICULAR
  Filled 2016-09-23: qty 2

## 2016-09-23 MED ORDER — LIDOCAINE HCL (PF) 1 % IJ SOLN
INTRAMUSCULAR | Status: AC
  Filled 2016-09-23: qty 5

## 2016-09-23 MED ORDER — LIDOCAINE HCL (PF) 1 % IJ SOLN
5.0000 mL | Freq: Once | INTRAMUSCULAR | Status: AC
  Administered 2016-09-23: 5 mL

## 2016-09-23 NOTE — Patient Instructions (Addendum)
____________________________________________________________________________________________  Post-Procedure instructions Instructions:  Apply ice: Fill a plastic sandwich bag with crushed ice. Cover it with a small towel and apply to injection site. Apply for 15 minutes then remove x 15 minutes. Repeat sequence on day of procedure, until you go to bed. The purpose is to minimize swelling and discomfort after procedure.  Apply heat: Apply heat to procedure site starting the day following the procedure. The purpose is to treat any soreness and discomfort from the procedure.  Food intake: Start with clear liquids (like water) and advance to regular food, as tolerated.   Physical activities: Keep activities to a minimum for the first 8 hours after the procedure.   Driving: If you have received any sedation, you are not allowed to drive for 24 hours after your procedure.  Blood thinner: Restart your blood thinner 6 hours after your procedure. (Only for those taking blood thinners)  Insulin: As soon as you can eat, you may resume your normal dosing schedule. (Only for those taking insulin)  Infection prevention: Keep procedure site clean and dry.  Post-procedure Pain Diary: Extremely important that this be done correctly and accurately. Recorded information will be used to determine the next step in treatment.  Pain evaluated is that of treated area only. Do not include pain from an untreated area.  Complete every hour, on the hour, for the initial 8 hours. Set an alarm to help you do this part accurately.  Do not go to sleep and have it completed later. It will not be accurate.  Follow-up appointment: Keep your follow-up appointment after the procedure. Usually 2 weeks for most procedures. (6 weeks in the case of radiofrequency.) Bring you pain diary.  Expect:  From numbing medicine (AKA: Local Anesthetics): Numbness or decrease in pain.  Onset: Full effect within 15 minutes of  injected.  Duration: It will depend on the type of local anesthetic used. On the average, 1 to 8 hours.   From steroids: Decrease in swelling or inflammation. Once inflammation is improved, relief of the pain will follow.  Onset of benefits: Depends on the amount of swelling present. The more swelling, the longer it will take for the benefits to be seen. In some cases, up to 10 days.  Duration: Steroids will stay in the system x 2 weeks. Duration of benefits will depend on multiple posibilities including persistent irritating factors.  From procedure: Some discomfort is to be expected once the numbing medicine wears off. This should be minimal if ice and heat are applied as instructed. Call if:  You experience numbness and weakness that gets worse with time, as opposed to wearing off.  New onset bowel or bladder incontinence. (Spinal procedures only)  Emergency Numbers:  Durning business hours (Monday - Thursday, 8:00 AM - 4:00 PM) (Friday, 9:00 AM - 12:00 Noon): [phone]  After hours: [phone] ____________________________________________________________________________________________  Pain Management Discharge Instructions  General Discharge Instructions :  If you need to reach your doctor call: Monday-Friday 8:00 am - 4:00 pm at [phone] or toll free [phone].  After clinic hours [phone] to have operator reach doctor.  Bring all of your medication bottles to all your appointments in the pain clinic.  To cancel or reschedule your appointment with Pain Management please remember to call 24 hours in advance to avoid a fee.  Refer to the educational materials which you have been given on: General Risks, I had my Procedure. Discharge Instructions, Post Sedation.  Post Procedure Instructions:  The drugs you   were given will stay in your system until tomorrow, so for the next 24 hours you should not drive, make any legal decisions or drink any alcoholic  beverages.  You may eat anything you prefer, but it is better to start with liquids then soups and crackers, and gradually work up to solid foods.  Please notify your doctor immediately if you have any unusual bleeding, trouble breathing or pain that is not related to your normal pain.  Depending on the type of procedure that was done, some parts of your body may feel week and/or numb.  This usually clears up by tonight or the next day.  Walk with the use of an assistive device or accompanied by an adult for the 24 hours.  You may use ice on the affected area for the first 24 hours.  Put ice in a Ziploc bag and cover with a towel and place against area 15 minutes on 15 minutes off.  You may switch to heat after 24 hours. Knee Injection A knee injection is a procedure to get medicine into your knee joint. Your health care provider puts a needle into the joint and injects medicine with an attached syringe. The injected medicine may relieve the pain, swelling, and stiffness of arthritis. The injected medicine may also help to lubricate and cushion your knee joint. You may need more than one injection. Tell a health care provider about:  Any allergies you have.  All medicines you are taking, including vitamins, herbs, eye drops, creams, and over-the-counter medicines.  Any problems you or family members have had with anesthetic medicines.  Any blood disorders you have.  Any surgeries you have had.  Any medical conditions you have. What are the risks? Generally, this is a safe procedure. However, problems may occur, including:  Infection.  Bleeding.  Worsening symptoms.  Damage to the area around your knee.  Allergic reaction to any of the medicines.  Skin reactions from repeated injections.  What happens before the procedure?  Ask your health care provider about changing or stopping your regular medicines. This is especially important if you are taking diabetes medicines or  blood thinners.  Plan to have someone take you home after the procedure. What happens during the procedure?  You will sit or lie down in a position for your knee to be treated.  The skin over your kneecap will be cleaned with a germ-killing solution (antiseptic).  You will be given a medicine that numbs the area (local anesthetic). You may feel some stinging.  After your knee becomes numb, you will have a second injection. This is the medicine. This needle is carefully placed between your kneecap and your knee. The medicine is injected into the joint space.  At the end of the procedure, the needle will be removed.  A bandage (dressing) may be placed over the injection site. The procedure may vary among health care providers and hospitals. What happens after the procedure?  You may have to move your knee through its full range of motion. This helps to get all of the medicine into your joint space.  Your blood pressure, heart rate, breathing rate, and blood oxygen level will be monitored often until the medicines you were given have worn off.  You will be watched to make sure that you do not have a reaction to the injected medicine. This information is not intended to replace advice given to you by your health care provider. Make sure you discuss any questions you  have with your health care provider. Document Released: 06/07/2006 Document Revised: 08/16/2015 Document Reviewed: 01/24/2014 Elsevier Interactive Patient Education  2018 [place]  What are the risk, side effects and possible complications? Generally speaking, most procedures are safe.  However, with any procedure there are risks, side effects, and the possibility of complications.  The risks and complications are dependent upon the sites that are lesioned, or the type of nerve block to be performed.  The closer the procedure is to the spine, the more serious the risks are.  Great care is  taken when placing the radio frequency needles, block needles or lesioning probes, but sometimes complications can occur. 1. Infection: Any time there is an injection through the skin, there is a risk of infection.  This is why sterile conditions are used for these blocks.  There are four possible types of infection. 1. Localized skin infection. 2. Central Nervous System Infection-This can be in the form of Meningitis, which can be deadly. 3. Epidural Infections-This can be in the form of an epidural abscess, which can cause pressure inside of the spine, causing compression of the spinal cord with subsequent paralysis. This would require an emergency surgery to decompress, and there are no guarantees that the patient would recover from the paralysis. 4. Discitis-This is an infection of the intervertebral discs.  It occurs in about 1% of discography procedures.  It is difficult to treat and it may lead to surgery.        2. Pain: the needles have to go through skin and soft tissues, will cause soreness.       3. Damage to internal structures:  The nerves to be lesioned may be near blood vessels or    other nerves which can be potentially damaged.       4. Bleeding: Bleeding is more common if the patient is taking blood thinners such as  aspirin, Coumadin, Ticiid, Plavix, etc., or if he/she have some genetic predisposition  such as hemophilia. Bleeding into the spinal canal can cause compression of the spinal  cord with subsequent paralysis.  This would require an emergency surgery to  decompress and there are no guarantees that the patient would recover from the  paralysis.       5. Pneumothorax:  Puncturing of a lung is a possibility, every time a needle is introduced in  the area of the chest or upper back.  Pneumothorax refers to free air around the  collapsed lung(s), inside of the thoracic cavity (chest cavity).  Another two possible  complications related to a similar event would include: Hemothorax  and Chylothorax.   These are variations of the Pneumothorax, where instead of air around the collapsed  lung(s), you may have blood or chyle, respectively.       6. Spinal headaches: They may occur with any procedures in the area of the spine.       7. Persistent CSF (Cerebro-Spinal Fluid) leakage: This is a rare problem, but may occur  with prolonged intrathecal or epidural catheters either due to the formation of a fistulous  track or a dural tear.       8. Nerve damage: By working so close to the spinal cord, there is always a possibility of  nerve damage, which could be as serious as a permanent spinal cord injury with  paralysis.       9. Death:  Although rare, severe deadly allergic reactions known as "Anaphylactic  reaction" can  occur to any of the medications used.      10. Worsening of the symptoms:  We can always make thing worse.  What are the chances of something like this happening? Chances of any of this occuring are extremely low.  By statistics, you have more of a chance of getting killed in a motor vehicle accident: while driving to the hospital than any of the above occurring .  Nevertheless, you should be aware that they are possibilities.  In general, it is similar to taking a shower.  Everybody knows that you can slip, hit your head and get killed.  Does that mean that you should not shower again?  Nevertheless always keep in mind that statistics do not mean anything if you happen to be on the wrong side of them.  Even if a procedure has a 1 (one) in a 1,000,000 (million) chance of going wrong, it you happen to be that one..Also, keep in mind that by statistics, you have more of a chance of having something go wrong when taking medications.  Who should not have this procedure? If you are on a blood thinning medication (e.g. Coumadin, Plavix, see list of "Blood Thinners"), or if you have an active infection going on, you should not have the procedure.  If you are taking any blood  thinners, please inform your physician.  How should I prepare for this procedure?  Do not eat or drink anything at least six hours prior to the procedure.  Bring a driver with you .  It cannot be a taxi.  Come accompanied by an adult that can drive you back, and that is strong enough to help you if your legs get weak or numb from the local anesthetic.  Take all of your medicines the morning of the procedure with just enough water to swallow them.  If you have diabetes, make sure that you are scheduled to have your procedure done first thing in the morning, whenever possible.  If you have diabetes, take only half of your insulin dose and notify our nurse that you have done so as soon as you arrive at the clinic.  If you are diabetic, but only take blood sugar pills (oral hypoglycemic), then do not take them on the morning of your procedure.  You may take them after you have had the procedure.  Do not take aspirin or any aspirin-containing medications, at least eleven (11) days prior to the procedure.  They may prolong bleeding.  Wear loose fitting clothing that may be easy to take off and that you would not mind if it got stained with Betadine or blood.  Do not wear any jewelry or perfume  Remove any nail coloring.  It will interfere with some of our monitoring equipment.  NOTE: Remember that this is not meant to be interpreted as a complete list of all possible complications.  Unforeseen problems may occur.  BLOOD THINNERS The following drugs contain aspirin or other products, which can cause increased bleeding during surgery and should not be taken for 2 weeks prior to and 1 week after surgery.  If you should need take something for relief of minor pain, you may take acetaminophen which is found in Tylenol,m Datril, Anacin-3 and Panadol. It is not blood thinner. The products listed below are.  Do not take any of the products listed below in addition to any listed on your instruction  sheet.  A.P.C or A.P.C with Codeine Codeine Phosphate Capsules #3 Ibuprofen Ridaura  ABC compound Congesprin Imuran rimadil  Advil Cope Indocin Robaxisal  Alka-Seltzer Effervescent Pain Reliever and Antacid Coricidin or Coricidin-D  Indomethacin Rufen  Alka-Seltzer plus Cold Medicine Cosprin Ketoprofen S-A-C Tablets  Anacin Analgesic Tablets or Capsules Coumadin Korlgesic Salflex  Anacin Extra Strength Analgesic tablets or capsules CP-2 Tablets Lanoril Salicylate  Anaprox Cuprimine Capsules Levenox Salocol  Anexsia-D Dalteparin Magan Salsalate  Anodynos Darvon compound Magnesium Salicylate Sine-off  Ansaid Dasin Capsules Magsal Sodium Salicylate  Anturane Depen Capsules Marnal Soma  APF Arthritis pain formula Dewitt's Pills Measurin Stanback  Argesic Dia-Gesic Meclofenamic Sulfinpyrazone  Arthritis Bayer Timed Release Aspirin Diclofenac Meclomen Sulindac  Arthritis pain formula Anacin Dicumarol Medipren Supac  Analgesic (Safety coated) Arthralgen Diffunasal Mefanamic Suprofen  Arthritis Strength Bufferin Dihydrocodeine Mepro Compound Suprol  Arthropan liquid Dopirydamole Methcarbomol with Aspirin Synalgos  ASA tablets/Enseals Disalcid Micrainin Tagament  Ascriptin Doan's Midol Talwin  Ascriptin A/D Dolene Mobidin Tanderil  Ascriptin Extra Strength Dolobid Moblgesic Ticlid  Ascriptin with Codeine Doloprin or Doloprin with Codeine Momentum Tolectin  Asperbuf Duoprin Mono-gesic Trendar  Aspergum Duradyne Motrin or Motrin IB Triminicin  Aspirin plain, buffered or enteric coated Durasal Myochrisine Trigesic  Aspirin Suppositories Easprin Nalfon Trillsate  Aspirin with Codeine Ecotrin Regular or Extra Strength Naprosyn Uracel  Atromid-S Efficin Naproxen Ursinus  Auranofin Capsules Elmiron Neocylate Vanquish  Axotal Emagrin Norgesic Verin  Azathioprine Empirin or Empirin with Codeine Normiflo Vitamin E  Azolid Emprazil Nuprin Voltaren  Bayer Aspirin plain, buffered or children's or  timed BC Tablets or powders Encaprin Orgaran Warfarin Sodium  Buff-a-Comp Enoxaparin Orudis Zorpin  Buff-a-Comp with Codeine Equegesic Os-Cal-Gesic   Buffaprin Excedrin plain, buffered or Extra Strength Oxalid   Bufferin Arthritis Strength Feldene Oxphenbutazone   Bufferin plain or Extra Strength Feldene Capsules Oxycodone with Aspirin   Bufferin with Codeine Fenoprofen Fenoprofen Pabalate or Pabalate-SF   Buffets II Flogesic Panagesic   Buffinol plain or Extra Strength Florinal or Florinal with Codeine Panwarfarin   Buf-Tabs Flurbiprofen Penicillamine   Butalbital Compound Four-way cold tablets Penicillin   Butazolidin Fragmin Pepto-Bismol   Carbenicillin Geminisyn Percodan   Carna Arthritis Reliever Geopen Persantine   Carprofen Gold's salt Persistin   Chloramphenicol Goody's Phenylbutazone   Chloromycetin Haltrain Piroxlcam   Clmetidine heparin Plaquenil   Cllnoril Hyco-pap Ponstel   Clofibrate Hydroxy chloroquine Propoxyphen         Before stopping any of these medications, be sure to consult the physician who ordered them.  Some, such as Coumadin (Warfarin) are ordered to prevent or treat serious conditions such as "deep thrombosis", "pumonary embolisms", and other heart problems.  The amount of time that you may need off of the medication may also vary with the medication and the reason for which you were taking it.  If you are taking any of these medications, please make sure you notify your pain physician before you undergo any procedures.

## 2016-09-23 NOTE — Progress Notes (Signed)
Safety precautions to be maintained throughout the outpatient stay will include: orient to surroundings, keep bed in low position, maintain call bell within reach at all times, provide assistance with transfer out of bed and ambulation.  

## 2016-09-23 NOTE — Progress Notes (Signed)
Patient's Name: Cathy Mitchell  MRN: [id number]  Referring Provider: Kathrine Haddock, NP  DOB: 1965/04/16  PCP: Kathrine Haddock, NP  DOS: 09/23/2016  Note by: Kathlen Brunswick. Dossie Arbour, MD  Service setting: Ambulatory outpatient  Location: [facility] (AMB) Pain Management Facility  Visit type: Procedure  Specialty: Interventional Pain Management  Patient type: Established   Primary Reason for Visit: Interventional Pain Management Treatment. CC: Knee Pain (both)  Procedure:  Anesthesia, Analgesia, Anxiolysis:  Type: Therapeutic Intra-Articular Hyalgan Knee Injection #1 Region: Lateral infrapatellar Knee Region Level: Knee Joint Laterality: Bilateral  Type: Local Anesthesia Local Anesthetic: Lidocaine 1% Route: Infiltration (Palmer/IM) IV Access: Declined Sedation: Declined  Indication(s): Analgesia          Indications: 1. Chronic knee pain (Location of Primary Source of Pain) (Bilateral) (R>L)   2. Osteoarthritis of knee (Bilateral) (R>L)    Pain Score: Pre-procedure: 3 /10 Post-procedure: 0-No pain/10  Pre-op Assessment:  Previous date of service: 09/17/16 Service provided: Evaluation Ms. Cathy Mitchell is a 51 y.o. (year old), female patient, seen today for interventional treatment. She  has a past surgical history that includes Tubal ligation; Dilation and curettage of uterus; Ganglion cyst excision (Bilateral); Hand surgery (Bilateral, Resection); Gastric bypass; and Ganglion cyst excision (Right, 10/08/2015). Her primarily concern today is the Knee Pain (both)  Initial Vital Signs: Last menstrual period 04/15/2015. BMI: 40.29 kg/m  Risk Assessment: Allergies: Reviewed. She is allergic to oxycodone-acetaminophen; nsaids; other; and percocet [oxycodone-acetaminophen].  Allergy Precautions: None required Coagulopathies: Reviewed. None identified.  Blood-thinner therapy: None at this time Active Infection(s): Reviewed. None identified. Ms. Cathy Mitchell is afebrile  Site Confirmation: Ms. Cathy Mitchell was asked to  confirm the procedure and laterality before marking the site Procedure checklist: Completed Consent: Before the procedure and under the influence of no sedative(s), amnesic(s), or anxiolytics, the patient was informed of the treatment options, risks and possible complications. To fulfill our ethical and legal obligations, as recommended by the American Medical Association's Code of Ethics, I have informed the patient of my clinical impression; the nature and purpose of the treatment or procedure; the risks, benefits, and possible complications of the intervention; the alternatives, including doing nothing; the risk(s) and benefit(s) of the alternative treatment(s) or procedure(s); and the risk(s) and benefit(s) of doing nothing. The patient was provided information about the general risks and possible complications associated with the procedure. These may include, but are not limited to: failure to achieve desired goals, infection, bleeding, organ or nerve damage, allergic reactions, paralysis, and death. In addition, the patient was informed of those risks and complications associated to the procedure, such as failure to decrease pain; infection; bleeding; organ or nerve damage with subsequent damage to sensory, motor, and/or autonomic systems, resulting in permanent pain, numbness, and/or weakness of one or several areas of the body; allergic reactions; (i.e.: anaphylactic reaction); and/or death. Furthermore, the patient was informed of those risks and complications associated with the medications. These include, but are not limited to: allergic reactions (i.e.: anaphylactic or anaphylactoid reaction(s)); adrenal axis suppression; blood sugar elevation that in diabetics may result in ketoacidosis or comma; water retention that in patients with history of congestive heart failure may result in shortness of breath, pulmonary edema, and decompensation with resultant heart failure; weight gain; swelling or  edema; medication-induced neural toxicity; particulate matter embolism and blood vessel occlusion with resultant organ, and/or nervous system infarction; and/or aseptic necrosis of one or more joints. Finally, the patient was informed that Medicine is not an exact science; therefore, there is  also the possibility of unforeseen or unpredictable risks and/or possible complications that may result in a catastrophic outcome. The patient indicated having understood very clearly. We have given the patient no guarantees and we have made no promises. Enough time was given to the patient to ask questions, all of which were answered to the patient's satisfaction. Ms. Cathy Mitchell has indicated that she wanted to continue with the procedure. Attestation: I, the ordering provider, attest that I have discussed with the patient the benefits, risks, side-effects, alternatives, likelihood of achieving goals, and potential problems during recovery for the procedure that I have provided informed consent. Date: 09/23/2016; Time: 7:36 AM  Pre-Procedure Preparation:  Monitoring: As per clinic protocol. Respiration, ETCO2, SpO2, BP, heart rate and rhythm monitor placed and checked for adequate function Safety Precautions: Patient was assessed for positional comfort and pressure points before starting the procedure. Time-out: I initiated and conducted the "Time-out" before starting the procedure, as per protocol. The patient was asked to participate by confirming the accuracy of the "Time Out" information. Verification of the correct person, site, and procedure were performed and confirmed by me, the nursing staff, and the patient. "Time-out" conducted as per Joint Commission's Universal Protocol (UP.01.01.01). "Time-out" Date & Time: 09/23/2016; 0815 hrs.  Description of Procedure Process:   Position: Sitting Target Area: Knee Joint Approach: Just above the Lateral tibial plateau, lateral to the infrapatellar tendon. Area Prepped:  Entire knee area, from the mid-thigh to the mid-shin. Prepping solution: ChloraPrep (2% chlorhexidine gluconate and 70% isopropyl alcohol) Safety Precautions: Aspiration looking for blood return was conducted prior to all injections. At no point did we inject any substances, as a needle was being advanced. No attempts were made at seeking any paresthesias. Safe injection practices and needle disposal techniques used. Medications properly checked for expiration dates. SDV (single dose vial) medications used. Description of the Procedure: Protocol guidelines were followed. The patient was placed in position over the fluoroscopy table. The target area was identified and the area prepped in the usual manner. Skin desensitized using vapocoolant spray. Skin & deeper tissues infiltrated with local anesthetic. Appropriate amount of time allowed to pass for local anesthetics to take effect. The procedure needles were then advanced to the target area. Proper needle placement secured. Negative aspiration confirmed. Solution injected in intermittent fashion, asking for systemic symptoms every 0.5cc of injectate. The needles were then removed and the area cleansed, making sure to leave some of the prepping solution back to take advantage of its long term bactericidal properties. Vitals:   09/23/16 0749 09/23/16 0800 09/23/16 0830  BP: (!) 142/99 108/83 (!) 155/88  Pulse:  73 68  Resp: 16 16 16   Temp: 97.9 F (36.6 C)    SpO2: 99% 99% 98%  Weight: 265 lb (120.2 kg)    Height: 5\' 8"  (1.727 m)      Start Time: 0821 hrs. End Time: 0825 hrs. Materials:  Needle(s) Type: Regular needle Gauge: 25G Length: 1.5-in Medication(s): We administered lidocaine (PF), Sodium Hyaluronate, Sodium Hyaluronate, and ropivacaine (PF) 2 mg/mL (0.2%). Please see chart orders for dosing details.  Imaging Guidance:  Type of Imaging Technique: None used Indication(s): N/A Exposure Time: No patient exposure Contrast: None  used. Fluoroscopic Guidance: N/A Ultrasound Guidance: N/A Interpretation: N/A  Antibiotic Prophylaxis:  Indication(s): None identified Antibiotic given: None  Post-operative Assessment:  EBL: None Complications: No immediate post-treatment complications observed by team, or reported by patient. Note: The patient tolerated the entire procedure well. A repeat set of vitals were  taken after the procedure and the patient was kept under observation following institutional policy, for this type of procedure. Post-procedural neurological assessment was performed, showing return to baseline, prior to discharge. The patient was provided with post-procedure discharge instructions, including a section on how to identify potential problems. Should any problems arise concerning this procedure, the patient was given instructions to immediately contact us, at any time, without hesitation. In any case, we plan to contact the patient by telephone for a follow-up status report regarding this interventional procedure. Comments:  No additional relevant information.  Plan of Care  Disposition: Discharge home  Discharge Date & Time: 09/23/2016; 0831 hrs.  Physician-requested Follow-up:  Return for NS procedure, (2 wks), w/ MD (bilateral Hyalgan No. 2).  Future Appointments Date Time Provider [place]  10/07/2016 7:45 AM Milinda Pointer, MD [facility] None   Medications ordered for procedure: Meds ordered this encounter  Medications  . lidocaine (PF) (XYLOCAINE) 1 % injection 5 mL  . Sodium Hyaluronate SOSY 2 mL  . Sodium Hyaluronate SOSY 2 mL  . ropivacaine (PF) 2 mg/mL (0.2%) (NAROPIN) injection 2 mL   Medications administered: We administered lidocaine (PF), Sodium Hyaluronate, Sodium Hyaluronate, and ropivacaine (PF) 2 mg/mL (0.2%).  See the medical record for exact dosing, route, and time of administration.  Lab-work, Procedure(s), & Referral(s) Ordered: Orders Placed This Encounter   Procedures  . KNEE INJECTION  . KNEE INJECTION  . Informed Consent Details: Transcribe to consent form and obtain patient signature  . Provider attestation of informed consent for procedure/surgical case  . Verify informed consent  . Discharge instructions  . Follow-up   Imaging Ordered: New Prescriptions   No medications on file   Primary Care Physician: Kathrine Haddock, NP Location: [facility] Note by: Kathlen Brunswick. Dossie Arbour, M.D, DABA, DABAPM, DABPM, DABIPP, FIPP Date: 09/23/2016; Time: 9:06 AM  Disclaimer:  Medicine is not an Chief Strategy Officer. The only guarantee in medicine is that nothing is guaranteed. It is important to note that the decision to proceed with this intervention was based on the information collected from the patient. The Data and conclusions were drawn from the patient's questionnaire, the interview, and the physical examination. Because the information was provided in large part by the patient, it cannot be guaranteed that it has not been purposely or unconsciously manipulated. Every effort has been made to obtain as much relevant data as possible for this evaluation. It is important to note that the conclusions that lead to this procedure are derived in large part from the available data. Always take into account that the treatment will also be dependent on availability of resources and existing treatment guidelines, considered by other Pain Management Practitioners as being common knowledge and practice, at the time of the intervention. For Medico-Legal purposes, it is also important to point out that variation in procedural techniques and pharmacological choices are the acceptable norm. The indications, contraindications, technique, and results of the above procedure should only be interpreted and judged by a Board-Certified Interventional Pain Specialist with extensive familiarity and expertise in the same exact procedure and  technique.  Instructions provided at this appointment: Patient Instructions   ____________________________________________________________________________________________  Post-Procedure instructions Instructions:  Apply ice: Fill a plastic sandwich bag with crushed ice. Cover it with a small towel and apply to injection site. Apply for 15 minutes then remove x 15 minutes. Repeat sequence on day of procedure, until you go to bed. The purpose is to minimize swelling and discomfort after procedure.  Apply heat: Apply heat to procedure site starting the day following the procedure. The purpose is to treat any soreness and discomfort from the procedure.  Food intake: Start with clear liquids (like water) and advance to regular food, as tolerated.   Physical activities: Keep activities to a minimum for the first 8 hours after the procedure.   Driving: If you have received any sedation, you are not allowed to drive for 24 hours after your procedure.  Blood thinner: Restart your blood thinner 6 hours after your procedure. (Only for those taking blood thinners)  Insulin: As soon as you can eat, you may resume your normal dosing schedule. (Only for those taking insulin)  Infection prevention: Keep procedure site clean and dry.  Post-procedure Pain Diary: Extremely important that this be done correctly and accurately. Recorded information will be used to determine the next step in treatment.  Pain evaluated is that of treated area only. Do not include pain from an untreated area.  Complete every hour, on the hour, for the initial 8 hours. Set an alarm to help you do this part accurately.  Do not go to sleep and have it completed later. It will not be accurate.  Follow-up appointment: Keep your follow-up appointment after the procedure. Usually 2 weeks for most procedures. (6 weeks in the case of radiofrequency.) Bring you pain diary.  Expect:  From numbing medicine (AKA: Local  Anesthetics): Numbness or decrease in pain.  Onset: Full effect within 15 minutes of injected.  Duration: It will depend on the type of local anesthetic used. On the average, 1 to 8 hours.   From steroids: Decrease in swelling or inflammation. Once inflammation is improved, relief of the pain will follow.  Onset of benefits: Depends on the amount of swelling present. The more swelling, the longer it will take for the benefits to be seen. In some cases, up to 10 days.  Duration: Steroids will stay in the system x 2 weeks. Duration of benefits will depend on multiple posibilities including persistent irritating factors.  From procedure: Some discomfort is to be expected once the numbing medicine wears off. This should be minimal if ice and heat are applied as instructed. Call if:  You experience numbness and weakness that gets worse with time, as opposed to wearing off.  New onset bowel or bladder incontinence. (Spinal procedures only)  Emergency Numbers:  Durning business hours (Monday - Thursday, 8:00 AM - 4:00 PM) (Friday, 9:00 AM - 12:00 Noon): [phone]  After hours: [phone] ____________________________________________________________________________________________  Pain Management Discharge Instructions  General Discharge Instructions :  If you need to reach your doctor call: Monday-Friday 8:00 am - 4:00 pm at [phone] or toll free [phone].  After clinic hours [phone] to have operator reach doctor.  Bring all of your medication bottles to all your appointments in the pain clinic.  To cancel or reschedule your appointment with Pain Management please remember to call 24 hours in advance to avoid a fee.  Refer to the educational materials which you have been given on: General Risks, I had my Procedure. Discharge Instructions, Post Sedation.  Post Procedure Instructions:  The drugs you were given will stay in your system until tomorrow, so for the  next 24 hours you should not drive, make any legal decisions or drink any alcoholic beverages.  You may eat anything you prefer, but it is better to start with liquids then soups and crackers, and gradually work up to solid foods.  Please notify  your doctor immediately if you have any unusual bleeding, trouble breathing or pain that is not related to your normal pain.  Depending on the type of procedure that was done, some parts of your body may feel week and/or numb.  This usually clears up by tonight or the next day.  Walk with the use of an assistive device or accompanied by an adult for the 24 hours.  You may use ice on the affected area for the first 24 hours.  Put ice in a Ziploc bag and cover with a towel and place against area 15 minutes on 15 minutes off.  You may switch to heat after 24 hours. Knee Injection A knee injection is a procedure to get medicine into your knee joint. Your health care provider puts a needle into the joint and injects medicine with an attached syringe. The injected medicine may relieve the pain, swelling, and stiffness of arthritis. The injected medicine may also help to lubricate and cushion your knee joint. You may need more than one injection. Tell a health care provider about:  Any allergies you have.  All medicines you are taking, including vitamins, herbs, eye drops, creams, and over-the-counter medicines.  Any problems you or family members have had with anesthetic medicines.  Any blood disorders you have.  Any surgeries you have had.  Any medical conditions you have. What are the risks? Generally, this is a safe procedure. However, problems may occur, including:  Infection.  Bleeding.  Worsening symptoms.  Damage to the area around your knee.  Allergic reaction to any of the medicines.  Skin reactions from repeated injections.  What happens before the procedure?  Ask your health care provider about changing or stopping your regular  medicines. This is especially important if you are taking diabetes medicines or blood thinners.  Plan to have someone take you home after the procedure. What happens during the procedure?  You will sit or lie down in a position for your knee to be treated.  The skin over your kneecap will be cleaned with a germ-killing solution (antiseptic).  You will be given a medicine that numbs the area (local anesthetic). You may feel some stinging.  After your knee becomes numb, you will have a second injection. This is the medicine. This needle is carefully placed between your kneecap and your knee. The medicine is injected into the joint space.  At the end of the procedure, the needle will be removed.  A bandage (dressing) may be placed over the injection site. The procedure may vary among health care providers and hospitals. What happens after the procedure?  You may have to move your knee through its full range of motion. This helps to get all of the medicine into your joint space.  Your blood pressure, heart rate, breathing rate, and blood oxygen level will be monitored often until the medicines you were given have worn off.  You will be watched to make sure that you do not have a reaction to the injected medicine. This information is not intended to replace advice given to you by your health care provider. Make sure you discuss any questions you have with your health care provider. Document Released: 06/07/2006 Document Revised: 08/16/2015 Document Reviewed: 01/24/2014 Elsevier Interactive Patient Education  2018 [employer]  What are the risk, side effects and possible complications? Generally speaking, most procedures are safe.  However, with any procedure there are risks, side effects, and the possibility of complications.  The risks and complications are dependent upon the sites that are lesioned, or the type of nerve block to be performed.  The closer  the procedure is to the spine, the more serious the risks are.  Great care is taken when placing the radio frequency needles, block needles or lesioning probes, but sometimes complications can occur. 1. Infection: Any time there is an injection through the skin, there is a risk of infection.  This is why sterile conditions are used for these blocks.  There are four possible types of infection. 1. Localized skin infection. 2. Central Nervous System Infection-This can be in the form of Meningitis, which can be deadly. 3. Epidural Infections-This can be in the form of an epidural abscess, which can cause pressure inside of the spine, causing compression of the spinal cord with subsequent paralysis. This would require an emergency surgery to decompress, and there are no guarantees that the patient would recover from the paralysis. 4. Discitis-This is an infection of the intervertebral discs.  It occurs in about 1% of discography procedures.  It is difficult to treat and it may lead to surgery.        2. Pain: the needles have to go through skin and soft tissues, will cause soreness.       3. Damage to internal structures:  The nerves to be lesioned may be near blood vessels or    other nerves which can be potentially damaged.       4. Bleeding: Bleeding is more common if the patient is taking blood thinners such as  aspirin, Coumadin, Ticiid, Plavix, etc., or if he/she have some genetic predisposition  such as hemophilia. Bleeding into the spinal canal can cause compression of the spinal  cord with subsequent paralysis.  This would require an emergency surgery to  decompress and there are no guarantees that the patient would recover from the  paralysis.       5. Pneumothorax:  Puncturing of a lung is a possibility, every time a needle is introduced in  the area of the chest or upper back.  Pneumothorax refers to free air around the  collapsed lung(s), inside of the thoracic cavity (chest cavity).  Another two  possible  complications related to a similar event would include: Hemothorax and Chylothorax.   These are variations of the Pneumothorax, where instead of air around the collapsed  lung(s), you may have blood or chyle, respectively.       6. Spinal headaches: They may occur with any procedures in the area of the spine.       7. Persistent CSF (Cerebro-Spinal Fluid) leakage: This is a rare problem, but may occur  with prolonged intrathecal or epidural catheters either due to the formation of a fistulous  track or a dural tear.       8. Nerve damage: By working so close to the spinal cord, there is always a possibility of  nerve damage, which could be as serious as a permanent spinal cord injury with  paralysis.       9. Death:  Although rare, severe deadly allergic reactions known as "Anaphylactic  reaction" can occur to any of the medications used.      10. Worsening of the symptoms:  We can always make thing worse.  What are the chances of something like this happening? Chances of any of this occuring are extremely low.  By statistics, you have more of a chance of getting killed in a motor vehicle  accident: while driving to the hospital than any of the above occurring .  Nevertheless, you should be aware that they are possibilities.  In general, it is similar to taking a shower.  Everybody knows that you can slip, hit your head and get killed.  Does that mean that you should not shower again?  Nevertheless always keep in mind that statistics do not mean anything if you happen to be on the wrong side of them.  Even if a procedure has a 1 (one) in a 1,000,000 (million) chance of going wrong, it you happen to be that one..Also, keep in mind that by statistics, you have more of a chance of having something go wrong when taking medications.  Who should not have this procedure? If you are on a blood thinning medication (e.g. Coumadin, Plavix, see list of "Blood Thinners"), or if you have an active infection  going on, you should not have the procedure.  If you are taking any blood thinners, please inform your physician.  How should I prepare for this procedure?  Do not eat or drink anything at least six hours prior to the procedure.  Bring a driver with you .  It cannot be a taxi.  Come accompanied by an adult that can drive you back, and that is strong enough to help you if your legs get weak or numb from the local anesthetic.  Take all of your medicines the morning of the procedure with just enough water to swallow them.  If you have diabetes, make sure that you are scheduled to have your procedure done first thing in the morning, whenever possible.  If you have diabetes, take only half of your insulin dose and notify our nurse that you have done so as soon as you arrive at the clinic.  If you are diabetic, but only take blood sugar pills (oral hypoglycemic), then do not take them on the morning of your procedure.  You may take them after you have had the procedure.  Do not take aspirin or any aspirin-containing medications, at least eleven (11) days prior to the procedure.  They may prolong bleeding.  Wear loose fitting clothing that may be easy to take off and that you would not mind if it got stained with Betadine or blood.  Do not wear any jewelry or perfume  Remove any nail coloring.  It will interfere with some of our monitoring equipment.  NOTE: Remember that this is not meant to be interpreted as a complete list of all possible complications.  Unforeseen problems may occur.  BLOOD THINNERS The following drugs contain aspirin or other products, which can cause increased bleeding during surgery and should not be taken for 2 weeks prior to and 1 week after surgery.  If you should need take something for relief of minor pain, you may take acetaminophen which is found in Tylenol,m Datril, Anacin-3 and Panadol. It is not blood thinner. The products listed below are.  Do not take any of  the products listed below in addition to any listed on your instruction sheet.  A.P.C or A.P.C with Codeine Codeine Phosphate Capsules #3 Ibuprofen Ridaura  ABC compound Congesprin Imuran rimadil  Advil Cope Indocin Robaxisal  Alka-Seltzer Effervescent Pain Reliever and Antacid Coricidin or Coricidin-D  Indomethacin Rufen  Alka-Seltzer plus Cold Medicine Cosprin Ketoprofen S-A-C Tablets  Anacin Analgesic Tablets or Capsules Coumadin Korlgesic Salflex  Anacin Extra Strength Analgesic tablets or capsules CP-2 Tablets Lanoril Salicylate  Anaprox Cuprimine Capsules Levenox  Salocol  Anexsia-D Dalteparin Magan Salsalate  Anodynos Darvon compound Magnesium Salicylate Sine-off  Ansaid Dasin Capsules Magsal Sodium Salicylate  Anturane Depen Capsules Marnal Soma  APF Arthritis pain formula Dewitt's Pills Measurin Stanback  Argesic Dia-Gesic Meclofenamic Sulfinpyrazone  Arthritis Bayer Timed Release Aspirin Diclofenac Meclomen Sulindac  Arthritis pain formula Anacin Dicumarol Medipren Supac  Analgesic (Safety coated) Arthralgen Diffunasal Mefanamic Suprofen  Arthritis Strength Bufferin Dihydrocodeine Mepro Compound Suprol  Arthropan liquid Dopirydamole Methcarbomol with Aspirin Synalgos  ASA tablets/Enseals Disalcid Micrainin Tagament  Ascriptin Doan's Midol Talwin  Ascriptin A/D Dolene Mobidin Tanderil  Ascriptin Extra Strength Dolobid Moblgesic Ticlid  Ascriptin with Codeine Doloprin or Doloprin with Codeine Momentum Tolectin  Asperbuf Duoprin Mono-gesic Trendar  Aspergum Duradyne Motrin or Motrin IB Triminicin  Aspirin plain, buffered or enteric coated Durasal Myochrisine Trigesic  Aspirin Suppositories Easprin Nalfon Trillsate  Aspirin with Codeine Ecotrin Regular or Extra Strength Naprosyn Uracel  Atromid-S Efficin Naproxen Ursinus  Auranofin Capsules Elmiron Neocylate Vanquish  Axotal Emagrin Norgesic Verin  Azathioprine Empirin or Empirin with Codeine Normiflo Vitamin E  Azolid  Emprazil Nuprin Voltaren  Bayer Aspirin plain, buffered or children's or timed BC Tablets or powders Encaprin Orgaran Warfarin Sodium  Buff-a-Comp Enoxaparin Orudis Zorpin  Buff-a-Comp with Codeine Equegesic Os-Cal-Gesic   Buffaprin Excedrin plain, buffered or Extra Strength Oxalid   Bufferin Arthritis Strength Feldene Oxphenbutazone   Bufferin plain or Extra Strength Feldene Capsules Oxycodone with Aspirin   Bufferin with Codeine Fenoprofen Fenoprofen Pabalate or Pabalate-SF   Buffets II Flogesic Panagesic   Buffinol plain or Extra Strength Florinal or Florinal with Codeine Panwarfarin   Buf-Tabs Flurbiprofen Penicillamine   Butalbital Compound Four-way cold tablets Penicillin   Butazolidin Fragmin Pepto-Bismol   Carbenicillin Geminisyn Percodan   Carna Arthritis Reliever Geopen Persantine   Carprofen Gold's salt Persistin   Chloramphenicol Goody's Phenylbutazone   Chloromycetin Haltrain Piroxlcam   Clmetidine heparin Plaquenil   Cllnoril Hyco-pap Ponstel   Clofibrate Hydroxy chloroquine Propoxyphen         Before stopping any of these medications, be sure to consult the physician who ordered them.  Some, such as Coumadin (Warfarin) are ordered to prevent or treat serious conditions such as "deep thrombosis", "pumonary embolisms", and other heart problems.  The amount of time that you may need off of the medication may also vary with the medication and the reason for which you were taking it.  If you are taking any of these medications, please make sure you notify your pain physician before you undergo any procedures.

## 2016-09-24 ENCOUNTER — Telehealth: Payer: Self-pay | Admitting: *Deleted

## 2016-09-24 NOTE — Telephone Encounter (Signed)
Spoke with patient denies any questions or concerns from procedure on yesterday.

## 2016-10-07 ENCOUNTER — Encounter: Payer: Self-pay | Admitting: Pain Medicine

## 2016-10-07 ENCOUNTER — Ambulatory Visit: Payer: BLUE CROSS/BLUE SHIELD | Attending: Pain Medicine | Admitting: Pain Medicine

## 2016-10-07 VITALS — BP 126/77 | HR 72 | Temp 97.8°F | Resp 16 | Ht 68.0 in | Wt 256.0 lb

## 2016-10-07 DIAGNOSIS — M170 Bilateral primary osteoarthritis of knee: Secondary | ICD-10-CM | POA: Insufficient documentation

## 2016-10-07 DIAGNOSIS — G894 Chronic pain syndrome: Secondary | ICD-10-CM | POA: Diagnosis not present

## 2016-10-07 DIAGNOSIS — M25561 Pain in right knee: Secondary | ICD-10-CM

## 2016-10-07 DIAGNOSIS — G8929 Other chronic pain: Secondary | ICD-10-CM

## 2016-10-07 DIAGNOSIS — M25562 Pain in left knee: Secondary | ICD-10-CM | POA: Diagnosis not present

## 2016-10-07 MED ORDER — ROPIVACAINE HCL 2 MG/ML IJ SOLN
2.0000 mL | Freq: Once | INTRAMUSCULAR | Status: AC
  Administered 2016-10-07: 10 mL via INTRA_ARTICULAR

## 2016-10-07 MED ORDER — SODIUM HYALURONATE (VISCOSUP) 20 MG/2ML IX SOSY
2.0000 mL | PREFILLED_SYRINGE | Freq: Once | INTRA_ARTICULAR | Status: AC
  Administered 2016-10-07: 09:00:00 via INTRA_ARTICULAR
  Filled 2016-10-07: qty 2

## 2016-10-07 MED ORDER — ROPIVACAINE HCL 2 MG/ML IJ SOLN
INTRAMUSCULAR | Status: AC
  Filled 2016-10-07: qty 10

## 2016-10-07 MED ORDER — LIDOCAINE HCL (PF) 1 % IJ SOLN
INTRAMUSCULAR | Status: AC
  Filled 2016-10-07: qty 5

## 2016-10-07 MED ORDER — DICLOFENAC SODIUM 2 % TD SOLN: 40.0000 mg | Freq: Two times a day (BID) | TRANSDERMAL | 0 refills | Status: DC | PRN

## 2016-10-07 MED ORDER — LIDOCAINE HCL (PF) 1 % IJ SOLN
5.0000 mL | Freq: Once | INTRAMUSCULAR | Status: AC
  Administered 2016-10-07: 5 mL

## 2016-10-07 NOTE — Progress Notes (Signed)
Patient's Name: Cathy Mitchell  MRN: [id number]  Referring Provider: Kathrine Haddock, NP  DOB: 08-01-1965  PCP: Kathrine Haddock, NP  DOS: 10/07/2016  Note by: Gaspar Cola, MD  Service setting: Ambulatory outpatient  Specialty: Interventional Pain Management  Patient type: Established  Location: [facility] (AMB) Pain Management Facility  Visit type: Interventional Procedure   Primary Reason for Visit: Interventional Pain Management Treatment. CC: Knee Pain (both)  Procedure:  Anesthesia, Analgesia, Anxiolysis:  Type: Therapeutic Intra-Articular Hyalgan Knee Injection #2 Region: Lateral infrapatellar Knee Region Level: Knee Joint Laterality: Bilateral  Type: Local Anesthesia Local Anesthetic: Lidocaine 1% Route: Infiltration ([place]/IM) IV Access: Declined Sedation: Declined  Indication(s): Analgesia          Indications: 1. Chronic knee pain (Location of Primary Source of Pain) (Bilateral) (R>L)   2. Osteoarthritis of knee (Bilateral) (R>L)   3. Chronic pain syndrome    Pain Score: Pre-procedure: 3  (right 3 left 1/10)/10 Post-procedure: 0-No pain/10  Pre-op Assessment:  Previous date of service: 09/23/16 Service provided: Procedure Cathy Mitchell is a 51 y.o. (year old), female patient, seen today for interventional treatment. She  has a past surgical history that includes Tubal ligation; Dilation and curettage of uterus; Ganglion cyst excision (Bilateral); Hand surgery (Bilateral, Resection); Gastric bypass; and Ganglion cyst excision (Right, 10/08/2015). Cathy Mitchell has a current medication list which includes the following prescription(s): acetaminophen, calcium citrate-vitamin d, cholecalciferol, diclofenac sodium, ferrous sulfate, multiple vitamins/womens, vitamin b-12, vitamin c, and zinc. Her primarily concern today is the Knee Pain (both)  Initial Vital Signs: Last menstrual period 04/15/2015. BMI: 38.92 kg/m  Risk Assessment: Allergies: Reviewed. She is allergic to  oxycodone-acetaminophen; nsaids; other; and percocet [oxycodone-acetaminophen].  Allergy Precautions: None required Coagulopathies: Reviewed. None identified.  Blood-thinner therapy: None at this time Active Infection(s): Reviewed. None identified. Cathy Mitchell is afebrile  Site Confirmation: Cathy Mitchell was asked to confirm the procedure and laterality before marking the site Procedure checklist: Completed Consent: Before the procedure and under the influence of no sedative(s), amnesic(s), or anxiolytics, the patient was informed of the treatment options, risks and possible complications. To fulfill our ethical and legal obligations, as recommended by the American Medical Association's Code of Ethics, I have informed the patient of my clinical impression; the nature and purpose of the treatment or procedure; the risks, benefits, and possible complications of the intervention; the alternatives, including doing nothing; the risk(s) and benefit(s) of the alternative treatment(s) or procedure(s); and the risk(s) and benefit(s) of doing nothing. The patient was provided information about the general risks and possible complications associated with the procedure. These may include, but are not limited to: failure to achieve desired goals, infection, bleeding, organ or nerve damage, allergic reactions, paralysis, and death. In addition, the patient was informed of those risks and complications associated to the procedure, such as failure to decrease pain; infection; bleeding; organ or nerve damage with subsequent damage to sensory, motor, and/or autonomic systems, resulting in permanent pain, numbness, and/or weakness of one or several areas of the body; allergic reactions; (i.e.: anaphylactic reaction); and/or death. Furthermore, the patient was informed of those risks and complications associated with the medications. These include, but are not limited to: allergic reactions (i.e.: anaphylactic or anaphylactoid  reaction(s)); adrenal axis suppression; blood sugar elevation that in diabetics may result in ketoacidosis or comma; water retention that in patients with history of congestive heart failure may result in shortness of breath, pulmonary edema, and decompensation with resultant heart failure; weight gain; swelling or edema;  medication-induced neural toxicity; particulate matter embolism and blood vessel occlusion with resultant organ, and/or nervous system infarction; and/or aseptic necrosis of one or more joints. Finally, the patient was informed that Medicine is not an exact science; therefore, there is also the possibility of unforeseen or unpredictable risks and/or possible complications that may result in a catastrophic outcome. The patient indicated having understood very clearly. We have given the patient no guarantees and we have made no promises. Enough time was given to the patient to ask questions, all of which were answered to the patient's satisfaction. Cathy Mitchell has indicated that she wanted to continue with the procedure. Attestation: I, the ordering provider, attest that I have discussed with the patient the benefits, risks, side-effects, alternatives, likelihood of achieving goals, and potential problems during recovery for the procedure that I have provided informed consent. Date: 10/07/2016; Time: 7:56 AM  Pre-Procedure Preparation:  Monitoring: As per clinic protocol. Respiration, ETCO2, SpO2, BP, heart rate and rhythm monitor placed and checked for adequate function Safety Precautions: Patient was assessed for positional comfort and pressure points before starting the procedure. Time-out: I initiated and conducted the "Time-out" before starting the procedure, as per protocol. The patient was asked to participate by confirming the accuracy of the "Time Out" information. Verification of the correct person, site, and procedure were performed and confirmed by me, the nursing staff, and the  patient. "Time-out" conducted as per Joint Commission's Universal Protocol (UP.01.01.01). "Time-out" Date & Time: 10/07/2016; 0834 hrs.  Description of Procedure Process:   Position: Sitting Target Area: Knee Joint Approach: Just above the Lateral tibial plateau, lateral to the infrapatellar tendon. Area Prepped: Entire knee area, from the mid-thigh to the mid-shin. Prepping solution: ChloraPrep (2% chlorhexidine gluconate and 70% isopropyl alcohol) Safety Precautions: Aspiration looking for blood return was conducted prior to all injections. At no point did we inject any substances, as a needle was being advanced. No attempts were made at seeking any paresthesias. Safe injection practices and needle disposal techniques used. Medications properly checked for expiration dates. SDV (single dose vial) medications used. Description of the Procedure: Protocol guidelines were followed. The patient was placed in position over the fluoroscopy table. The target area was identified and the area prepped in the usual manner. Skin desensitized using vapocoolant spray. Skin & deeper tissues infiltrated with local anesthetic. Appropriate amount of time allowed to pass for local anesthetics to take effect. The procedure needles were then advanced to the target area. Proper needle placement secured. Negative aspiration confirmed. Solution injected in intermittent fashion, asking for systemic symptoms every 0.5cc of injectate. The needles were then removed and the area cleansed, making sure to leave some of the prepping solution back to take advantage of its long term bactericidal properties. Vitals:   10/07/16 0754 10/07/16 0833 10/07/16 0844  BP: 126/73 108/63 126/77  Pulse: 68 68 72  Resp: 16 16 16   Temp: 97.8 F (36.6 C)    SpO2: 98% 98% 98%  Weight: 256 lb (116.1 kg)    Height: 5\' 8"  (1.727 m)      Start Time: 0834 hrs. End Time: 0843 hrs. Materials:  Needle(s) Type: Regular needle Gauge: 22G Length:  3.5-in Medication(s): We administered lidocaine (PF), Sodium Hyaluronate, Sodium Hyaluronate, and ropivacaine (PF) 2 mg/mL (0.2%). Please see chart orders for dosing details.  Imaging Guidance:  Type of Imaging Technique: None used Indication(s): N/A Exposure Time: No patient exposure Contrast: None used. Fluoroscopic Guidance: N/A Ultrasound Guidance: N/A Interpretation: N/A  Antibiotic Prophylaxis:  Indication(s): None identified Antibiotic given: None  Post-operative Assessment:  EBL: None Complications: No immediate post-treatment complications observed by team, or reported by patient. Note: The patient tolerated the entire procedure well. A repeat set of vitals were taken after the procedure and the patient was kept under observation following institutional policy, for this type of procedure. Post-procedural neurological assessment was performed, showing return to baseline, prior to discharge. The patient was provided with post-procedure discharge instructions, including a section on how to identify potential problems. Should any problems arise concerning this procedure, the patient was given instructions to immediately contact us, at any time, without hesitation. In any case, we plan to contact the patient by telephone for a follow-up status report regarding this interventional procedure. Comments:  No additional relevant information.  Plan of Care  Disposition: Discharge home  Discharge Date & Time: 10/07/2016; 0852 hrs.  Physician-requested Follow-up:  Return for NS procedure, (2 wks), by MD.  Future Appointments Date Time Provider [place]  10/21/2016 7:45 AM Milinda Pointer, MD [facility] None   Medications ordered for procedure: Meds ordered this encounter  Medications  . lidocaine (PF) (XYLOCAINE) 1 % injection 5 mL  . Sodium Hyaluronate SOSY 2 mL  . Sodium Hyaluronate SOSY 2 mL  . ropivacaine (PF) 2 mg/mL (0.2%) (NAROPIN) injection 2 mL  . Diclofenac  Sodium 2 % SOLN    Sig: Place 40 mg onto the skin 2 (two) times daily as needed (for knee pain). (40 mg = 2 pump actuations). Use as directed.    Dispense:  112 g    Refill:  0    Do not place medication on "Automatic Refill". Fill one day early if pharmacy is closed on scheduled refill date.   Medications administered: We administered lidocaine (PF), Sodium Hyaluronate, Sodium Hyaluronate, and ropivacaine (PF) 2 mg/mL (0.2%).  See the medical record for exact dosing, route, and time of administration.  Lab-work, Procedure(s), & Referral(s) Ordered: Orders Placed This Encounter  Procedures  . KNEE INJECTION  . KNEE INJECTION  . Informed Consent Details: Transcribe to consent form and obtain patient signature  . Provider attestation of informed consent for procedure/surgical case  . Verify informed consent  . Discharge instructions  . Follow-up   Imaging Ordered: New Prescriptions   DICLOFENAC SODIUM 2 % SOLN    Place 40 mg onto the skin 2 (two) times daily as needed (for knee pain). (40 mg = 2 pump actuations). Use as directed.   Primary Care Physician: Kathrine Haddock, NP Location: [facility] Out[facility] Management Facility Note by: Gaspar Cola, MD Date: 10/07/2016; Time: 9:07 AM  Disclaimer:  Medicine is not an Chief Strategy Officer. The only guarantee in medicine is that nothing is guaranteed. It is important to note that the decision to proceed with this intervention was based on the information collected from the patient. The Data and conclusions were drawn from the patient's questionnaire, the interview, and the physical examination. Because the information was provided in large part by the patient, it cannot be guaranteed that it has not been purposely or unconsciously manipulated. Every effort has been made to obtain as much relevant data as possible for this evaluation. It is important to note that the conclusions that lead to this procedure are derived in large part from the  available data. Always take into account that the treatment will also be dependent on availability of resources and existing treatment guidelines, considered by other Pain Management Practitioners as being common knowledge and practice, at the  time of the intervention. For Medico-Legal purposes, it is also important to point out that variation in procedural techniques and pharmacological choices are the acceptable norm. The indications, contraindications, technique, and results of the above procedure should only be interpreted and judged by a Board-Certified Interventional Pain Specialist with extensive familiarity and expertise in the same exact procedure and technique.  Instructions provided at this appointment: Patient Instructions   ____________________________________________________________________________________________  Post-Procedure instructions Instructions:  Apply ice: Fill a plastic sandwich bag with crushed ice. Cover it with a small towel and apply to injection site. Apply for 15 minutes then remove x 15 minutes. Repeat sequence on day of procedure, until you go to bed. The purpose is to minimize swelling and discomfort after procedure.  Apply heat: Apply heat to procedure site starting the day following the procedure. The purpose is to treat any soreness and discomfort from the procedure.  Food intake: Start with clear liquids (like water) and advance to regular food, as tolerated.   Physical activities: Keep activities to a minimum for the first 8 hours after the procedure.   Driving: If you have received any sedation, you are not allowed to drive for 24 hours after your procedure.  Blood thinner: Restart your blood thinner 6 hours after your procedure. (Only for those taking blood thinners)  Insulin: As soon as you can eat, you may resume your normal dosing schedule. (Only for those taking insulin)  Infection prevention: Keep procedure site clean and dry.  Post-procedure  Pain Diary: Extremely important that this be done correctly and accurately. Recorded information will be used to determine the next step in treatment.  Pain evaluated is that of treated area only. Do not include pain from an untreated area.  Complete every hour, on the hour, for the initial 8 hours. Set an alarm to help you do this part accurately.  Do not go to sleep and have it completed later. It will not be accurate.  Follow-up appointment: Keep your follow-up appointment after the procedure. Usually 2 weeks for most procedures. (6 weeks in the case of radiofrequency.) Bring you pain diary.  Expect:  From numbing medicine (AKA: Local Anesthetics): Numbness or decrease in pain.  Onset: Full effect within 15 minutes of injected.  Duration: It will depend on the type of local anesthetic used. On the average, 1 to 8 hours.   From steroids: Decrease in swelling or inflammation. Once inflammation is improved, relief of the pain will follow.  Onset of benefits: Depends on the amount of swelling present. The more swelling, the longer it will take for the benefits to be seen. In some cases, up to 10 days.  Duration: Steroids will stay in the system x 2 weeks. Duration of benefits will depend on multiple posibilities including persistent irritating factors.  From procedure: Some discomfort is to be expected once the numbing medicine wears off. This should be minimal if ice and heat are applied as instructed. Call if:  You experience numbness and weakness that gets worse with time, as opposed to wearing off.  New onset bowel or bladder incontinence. (Spinal procedures only)  Emergency Numbers:  Durning business hours (Monday - Thursday, 8:00 AM - 4:00 PM) (Friday, 9:00 AM - 12:00 Noon): [phone]  After hours: [phone]  [phone] ____________________________________________________________________________________________  ____________________________________________________________________________________________  Preparing for your procedure (without sedation) Instructions: . Oral Intake: Do not eat or drink anything for at least 3 hours prior to your procedure. . Transportation: Unless otherwise stated by your physician, you  may drive yourself after the procedure. . Blood Pressure Medicine: Take your blood pressure medicine with a sip of water the morning of the procedure. . Blood thinners:  . Diabetics on insulin: Notify the staff so that you can be scheduled 1st case in the morning. If your diabetes requires high dose insulin, take only  of your normal insulin dose the morning of the procedure and notify the staff that you have done so. . Preventing infections: Shower with an antibacterial soap the morning of your procedure.  . Build-up your immune system: Take 1000 mg of Vitamin C with every meal (3 times a day) the day prior to your procedure. [place] Kitchen Antibiotics: Inform the staff if you have a condition or reason that requires you to take antibiotics before dental procedures. . Pregnancy: If you are pregnant, call and cancel the procedure. . Sickness: If you have a cold, fever, or any active infections, call and cancel the procedure. . Arrival: You must be in the facility at least 30 minutes prior to your scheduled procedure. . Children: Do not bring any children with you. . Dress appropriately: Bring dark clothing that you would not mind if they get stained. . Valuables: Do not bring any jewelry or valuables. Procedure appointments are reserved for interventional treatments only. [place] Kitchen No Prescription Refills. . No medication changes will be discussed during procedure appointments. . No disability issues will be  discussed. ____________________________________________________________________________________________  Pain Management Discharge Instructions  General Discharge Instructions :  If you need to reach your doctor call: Monday-Friday 8:00 am - 4:00 pm at [phone] or toll free [phone].  After clinic hours [phone] to have operator reach doctor.  Bring all of your medication bottles to all your appointments in the pain clinic.  To cancel or reschedule your appointment with Pain Management please remember to call 24 hours in advance to avoid a fee.  Refer to the educational materials which you have been given on: General Risks, I had my Procedure. Discharge Instructions, Post Sedation.  Post Procedure Instructions:  The drugs you were given will stay in your system until tomorrow, so for the next 24 hours you should not drive, make any legal decisions or drink any alcoholic beverages.  You may eat anything you prefer, but it is better to start with liquids then soups and crackers, and gradually work up to solid foods.  Please notify your doctor immediately if you have any unusual bleeding, trouble breathing or pain that is not related to your normal pain.  Depending on the type of procedure that was done, some parts of your body may feel week and/or numb.  This usually clears up by tonight or the next day.  Walk with the use of an assistive device or accompanied by an adult for the 24 hours.  You may use ice on the affected area for the first 24 hours.  Put ice in a Ziploc bag and cover with a towel and place against area 15 minutes on 15 minutes off.  You may switch to heat after 24 hours. Knee Injection A knee injection is a procedure to get medicine into your knee joint. Your health care provider puts a needle into the joint and injects medicine with an attached syringe. The injected medicine may relieve the pain, swelling, and stiffness of arthritis. The injected medicine  may also help to lubricate and cushion your knee joint. You may need more than one injection. Tell a health care provider about:  Any allergies you have.  All medicines you are taking, including vitamins, herbs, eye drops, creams, and over-the-counter medicines.  Any problems you or family members have had with anesthetic medicines.  Any blood disorders you have.  Any surgeries you have had.  Any medical conditions you have. What are the risks? Generally, this is a safe procedure. However, problems may occur, including:  Infection.  Bleeding.  Worsening symptoms.  Damage to the area around your knee.  Allergic reaction to any of the medicines.  Skin reactions from repeated injections.  What happens before the procedure?  Ask your health care provider about changing or stopping your regular medicines. This is especially important if you are taking diabetes medicines or blood thinners.  Plan to have someone take you home after the procedure. What happens during the procedure?  You will sit or lie down in a position for your knee to be treated.  The skin over your kneecap will be cleaned with a germ-killing solution (antiseptic).  You will be given a medicine that numbs the area (local anesthetic). You may feel some stinging.  After your knee becomes numb, you will have a second injection. This is the medicine. This needle is carefully placed between your kneecap and your knee. The medicine is injected into the joint space.  At the end of the procedure, the needle will be removed.  A bandage (dressing) may be placed over the injection site. The procedure may vary among health care providers and hospitals. What happens after the procedure?  You may have to move your knee through its full range of motion. This helps to get all of the medicine into your joint space.  Your blood pressure, heart rate, breathing rate, and blood oxygen level will be monitored often until the  medicines you were given have worn off.  You will be watched to make sure that you do not have a reaction to the injected medicine. This information is not intended to replace advice given to you by your health care provider. Make sure you discuss any questions you have with your health care provider. Document Released: 06/07/2006 Document Revised: 08/16/2015 Document Reviewed: 01/24/2014 Elsevier Interactive Patient Education  2018 [place]  What are the risk, side effects and possible complications? Generally speaking, most procedures are safe.  However, with any procedure there are risks, side effects, and the possibility of complications.  The risks and complications are dependent upon the sites that are lesioned, or the type of nerve block to be performed.  The closer the procedure is to the spine, the more serious the risks are.  Great care is taken when placing the radio frequency needles, block needles or lesioning probes, but sometimes complications can occur. 1. Infection: Any time there is an injection through the skin, there is a risk of infection.  This is why sterile conditions are used for these blocks.  There are four possible types of infection. 1. Localized skin infection. 2. Central Nervous System Infection-This can be in the form of Meningitis, which can be deadly. 3. Epidural Infections-This can be in the form of an epidural abscess, which can cause pressure inside of the spine, causing compression of the spinal cord with subsequent paralysis. This would require an emergency surgery to decompress, and there are no guarantees that the patient would recover from the paralysis. 4. Discitis-This is an infection of the intervertebral discs.  It occurs in about 1% of discography procedures.  It is difficult to treat and it may  lead to surgery.        2. Pain: the needles have to go through skin and soft tissues, will cause soreness.        3. Damage to internal structures:  The nerves to be lesioned may be near blood vessels or    other nerves which can be potentially damaged.       4. Bleeding: Bleeding is more common if the patient is taking blood thinners such as  aspirin, Coumadin, Ticiid, Plavix, etc., or if he/she have some genetic predisposition  such as hemophilia. Bleeding into the spinal canal can cause compression of the spinal  cord with subsequent paralysis.  This would require an emergency surgery to  decompress and there are no guarantees that the patient would recover from the  paralysis.       5. Pneumothorax:  Puncturing of a lung is a possibility, every time a needle is introduced in  the area of the chest or upper back.  Pneumothorax refers to free air around the  collapsed lung(s), inside of the thoracic cavity (chest cavity).  Another two possible  complications related to a similar event would include: Hemothorax and Chylothorax.   These are variations of the Pneumothorax, where instead of air around the collapsed  lung(s), you may have blood or chyle, respectively.       6. Spinal headaches: They may occur with any procedures in the area of the spine.       7. Persistent CSF (Cerebro-Spinal Fluid) leakage: This is a rare problem, but may occur  with prolonged intrathecal or epidural catheters either due to the formation of a fistulous  track or a dural tear.       8. Nerve damage: By working so close to the spinal cord, there is always a possibility of  nerve damage, which could be as serious as a permanent spinal cord injury with  paralysis.       9. Death:  Although rare, severe deadly allergic reactions known as "Anaphylactic  reaction" can occur to any of the medications used.      10. Worsening of the symptoms:  We can always make thing worse.  What are the chances of something like this happening? Chances of any of this occuring are extremely low.  By statistics, you have more of a chance of getting killed in a  motor vehicle accident: while driving to the hospital than any of the above occurring .  Nevertheless, you should be aware that they are possibilities.  In general, it is similar to taking a shower.  Everybody knows that you can slip, hit your head and get killed.  Does that mean that you should not shower again?  Nevertheless always keep in mind that statistics do not mean anything if you happen to be on the wrong side of them.  Even if a procedure has a 1 (one) in a 1,000,000 (million) chance of going wrong, it you happen to be that one..Also, keep in mind that by statistics, you have more of a chance of having something go wrong when taking medications.  Who should not have this procedure? If you are on a blood thinning medication (e.g. Coumadin, Plavix, see list of "Blood Thinners"), or if you have an active infection going on, you should not have the procedure.  If you are taking any blood thinners, please inform your physician.  How should I prepare for this procedure?  Do not eat or drink anything at least  six hours prior to the procedure.  Bring a driver with you .  It cannot be a taxi.  Come accompanied by an adult that can drive you back, and that is strong enough to help you if your legs get weak or numb from the local anesthetic.  Take all of your medicines the morning of the procedure with just enough water to swallow them.  If you have diabetes, make sure that you are scheduled to have your procedure done first thing in the morning, whenever possible.  If you have diabetes, take only half of your insulin dose and notify our nurse that you have done so as soon as you arrive at the clinic.  If you are diabetic, but only take blood sugar pills (oral hypoglycemic), then do not take them on the morning of your procedure.  You may take them after you have had the procedure.  Do not take aspirin or any aspirin-containing medications, at least eleven (11) days prior to the procedure.  They  may prolong bleeding.  Wear loose fitting clothing that may be easy to take off and that you would not mind if it got stained with Betadine or blood.  Do not wear any jewelry or perfume  Remove any nail coloring.  It will interfere with some of our monitoring equipment.  NOTE: Remember that this is not meant to be interpreted as a complete list of all possible complications.  Unforeseen problems may occur.  BLOOD THINNERS The following drugs contain aspirin or other products, which can cause increased bleeding during surgery and should not be taken for 2 weeks prior to and 1 week after surgery.  If you should need take something for relief of minor pain, you may take acetaminophen which is found in Tylenol,m Datril, Anacin-3 and Panadol. It is not blood thinner. The products listed below are.  Do not take any of the products listed below in addition to any listed on your instruction sheet.  A.P.C or A.P.C with Codeine Codeine Phosphate Capsules #3 Ibuprofen Ridaura  ABC compound Congesprin Imuran rimadil  Advil Cope Indocin Robaxisal  Alka-Seltzer Effervescent Pain Reliever and Antacid Coricidin or Coricidin-D  Indomethacin Rufen  Alka-Seltzer plus Cold Medicine Cosprin Ketoprofen S-A-C Tablets  Anacin Analgesic Tablets or Capsules Coumadin Korlgesic Salflex  Anacin Extra Strength Analgesic tablets or capsules CP-2 Tablets Lanoril Salicylate  Anaprox Cuprimine Capsules Levenox Salocol  Anexsia-D Dalteparin Magan Salsalate  Anodynos Darvon compound Magnesium Salicylate Sine-off  Ansaid Dasin Capsules Magsal Sodium Salicylate  Anturane Depen Capsules Marnal Soma  APF Arthritis pain formula Dewitt's Pills Measurin Stanback  Argesic Dia-Gesic Meclofenamic Sulfinpyrazone  Arthritis Bayer Timed Release Aspirin Diclofenac Meclomen Sulindac  Arthritis pain formula Anacin Dicumarol Medipren Supac  Analgesic (Safety coated) Arthralgen Diffunasal Mefanamic Suprofen  Arthritis Strength Bufferin  Dihydrocodeine Mepro Compound Suprol  Arthropan liquid Dopirydamole Methcarbomol with Aspirin Synalgos  ASA tablets/Enseals Disalcid Micrainin Tagament  Ascriptin Doan's Midol Talwin  Ascriptin A/D Dolene Mobidin Tanderil  Ascriptin Extra Strength Dolobid Moblgesic Ticlid  Ascriptin with Codeine Doloprin or Doloprin with Codeine Momentum Tolectin  Asperbuf Duoprin Mono-gesic Trendar  Aspergum Duradyne Motrin or Motrin IB Triminicin  Aspirin plain, buffered or enteric coated Durasal Myochrisine Trigesic  Aspirin Suppositories Easprin Nalfon Trillsate  Aspirin with Codeine Ecotrin Regular or Extra Strength Naprosyn Uracel  Atromid-S Efficin Naproxen Ursinus  Auranofin Capsules Elmiron Neocylate Vanquish  Axotal Emagrin Norgesic Verin  Azathioprine Empirin or Empirin with Codeine Normiflo Vitamin E  Azolid Emprazil Nuprin Voltaren  Bayer Aspirin  plain, buffered or children's or timed BC Tablets or powders Encaprin Orgaran Warfarin Sodium  Buff-a-Comp Enoxaparin Orudis Zorpin  Buff-a-Comp with Codeine Equegesic Os-Cal-Gesic   Buffaprin Excedrin plain, buffered or Extra Strength Oxalid   Bufferin Arthritis Strength Feldene Oxphenbutazone   Bufferin plain or Extra Strength Feldene Capsules Oxycodone with Aspirin   Bufferin with Codeine Fenoprofen Fenoprofen Pabalate or Pabalate-SF   Buffets II Flogesic Panagesic   Buffinol plain or Extra Strength Florinal or Florinal with Codeine Panwarfarin   Buf-Tabs Flurbiprofen Penicillamine   Butalbital Compound Four-way cold tablets Penicillin   Butazolidin Fragmin Pepto-Bismol   Carbenicillin Geminisyn Percodan   Carna Arthritis Reliever Geopen Persantine   Carprofen Gold's salt Persistin   Chloramphenicol Goody's Phenylbutazone   Chloromycetin Haltrain Piroxlcam   Clmetidine heparin Plaquenil   Cllnoril Hyco-pap Ponstel   Clofibrate Hydroxy chloroquine Propoxyphen         Before stopping any of these medications, be sure to consult the  physician who ordered them.  Some, such as Coumadin (Warfarin) are ordered to prevent or treat serious conditions such as "deep thrombosis", "pumonary embolisms", and other heart problems.  The amount of time that you may need off of the medication may also vary with the medication and the reason for which you were taking it.  If you are taking any of these medications, please make sure you notify your pain physician before you undergo any procedures.

## 2016-10-07 NOTE — Patient Instructions (Addendum)
____________________________________________________________________________________________  Post-Procedure instructions Instructions:  Apply ice: Fill a plastic sandwich bag with crushed ice. Cover it with a small towel and apply to injection site. Apply for 15 minutes then remove x 15 minutes. Repeat sequence on day of procedure, until you go to bed. The purpose is to minimize swelling and discomfort after procedure.  Apply heat: Apply heat to procedure site starting the day following the procedure. The purpose is to treat any soreness and discomfort from the procedure.  Food intake: Start with clear liquids (like water) and advance to regular food, as tolerated.   Physical activities: Keep activities to a minimum for the first 8 hours after the procedure.   Driving: If you have received any sedation, you are not allowed to drive for 24 hours after your procedure.  Blood thinner: Restart your blood thinner 6 hours after your procedure. (Only for those taking blood thinners)  Insulin: As soon as you can eat, you may resume your normal dosing schedule. (Only for those taking insulin)  Infection prevention: Keep procedure site clean and dry.  Post-procedure Pain Diary: Extremely important that this be done correctly and accurately. Recorded information will be used to determine the next step in treatment.  Pain evaluated is that of treated area only. Do not include pain from an untreated area.  Complete every hour, on the hour, for the initial 8 hours. Set an alarm to help you do this part accurately.  Do not go to sleep and have it completed later. It will not be accurate.  Follow-up appointment: Keep your follow-up appointment after the procedure. Usually 2 weeks for most procedures. (6 weeks in the case of radiofrequency.) Bring you pain diary.  Expect:  From numbing medicine (AKA: Local Anesthetics): Numbness or decrease in pain.  Onset: Full effect within 15 minutes of  injected.  Duration: It will depend on the type of local anesthetic used. On the average, 1 to 8 hours.   From steroids: Decrease in swelling or inflammation. Once inflammation is improved, relief of the pain will follow.  Onset of benefits: Depends on the amount of swelling present. The more swelling, the longer it will take for the benefits to be seen. In some cases, up to 10 days.  Duration: Steroids will stay in the system x 2 weeks. Duration of benefits will depend on multiple posibilities including persistent irritating factors.  From procedure: Some discomfort is to be expected once the numbing medicine wears off. This should be minimal if ice and heat are applied as instructed. Call if:  You experience numbness and weakness that gets worse with time, as opposed to wearing off.  New onset bowel or bladder incontinence. (Spinal procedures only)  Emergency Numbers:  Durning business hours (Monday - Thursday, 8:00 AM - 4:00 PM) (Friday, 9:00 AM - 12:00 Noon): [phone]  After hours: [phone] ____________________________________________________________________________________________  ____________________________________________________________________________________________  Preparing for your procedure (without sedation) Instructions: . Oral Intake: Do not eat or drink anything for at least 3 hours prior to your procedure. . Transportation: Unless otherwise stated by your physician, you may drive yourself after the procedure. . Blood Pressure Medicine: Take your blood pressure medicine with a sip of water the morning of the procedure. . Blood thinners:  . Diabetics on insulin: Notify the staff so that you can be scheduled 1st case in the morning. If your diabetes requires high dose insulin, take only  of your normal insulin dose the morning of the procedure and notify the staff   that you have done so. . Preventing infections: Shower with an antibacterial soap the  morning of your procedure.  . Build-up your immune system: Take 1000 mg of Vitamin C with every meal (3 times a day) the day prior to your procedure. Marland Kitchen Antibiotics: Inform the staff if you have a condition or reason that requires you to take antibiotics before dental procedures. . Pregnancy: If you are pregnant, call and cancel the procedure. . Sickness: If you have a cold, fever, or any active infections, call and cancel the procedure. . Arrival: You must be in the facility at least 30 minutes prior to your scheduled procedure. . Children: Do not bring any children with you. . Dress appropriately: Bring dark clothing that you would not mind if they get stained. . Valuables: Do not bring any jewelry or valuables. Procedure appointments are reserved for interventional treatments only. Marland Kitchen No Prescription Refills. . No medication changes will be discussed during procedure appointments. . No disability issues will be discussed. ____________________________________________________________________________________________  Pain Management Discharge Instructions  General Discharge Instructions :  If you need to reach your doctor call: Monday-Friday 8:00 am - 4:00 pm at [phone] or toll free [phone].  After clinic hours [phone] to have operator reach doctor.  Bring all of your medication bottles to all your appointments in the pain clinic.  To cancel or reschedule your appointment with Pain Management please remember to call 24 hours in advance to avoid a fee.  Refer to the educational materials which you have been given on: General Risks, I had my Procedure. Discharge Instructions, Post Sedation.  Post Procedure Instructions:  The drugs you were given will stay in your system until tomorrow, so for the next 24 hours you should not drive, make any legal decisions or drink any alcoholic beverages.  You may eat anything you prefer, but it is better to start with liquids then soups  and crackers, and gradually work up to solid foods.  Please notify your doctor immediately if you have any unusual bleeding, trouble breathing or pain that is not related to your normal pain.  Depending on the type of procedure that was done, some parts of your body may feel week and/or numb.  This usually clears up by tonight or the next day.  Walk with the use of an assistive device or accompanied by an adult for the 24 hours.  You may use ice on the affected area for the first 24 hours.  Put ice in a Ziploc bag and cover with a towel and place against area 15 minutes on 15 minutes off.  You may switch to heat after 24 hours. Knee Injection A knee injection is a procedure to get medicine into your knee joint. Your health care provider puts a needle into the joint and injects medicine with an attached syringe. The injected medicine may relieve the pain, swelling, and stiffness of arthritis. The injected medicine may also help to lubricate and cushion your knee joint. You may need more than one injection. Tell a health care provider about:  Any allergies you have.  All medicines you are taking, including vitamins, herbs, eye drops, creams, and over-the-counter medicines.  Any problems you or family members have had with anesthetic medicines.  Any blood disorders you have.  Any surgeries you have had.  Any medical conditions you have. What are the risks? Generally, this is a safe procedure. However, problems may occur, including:  Infection.  Bleeding.  Worsening symptoms.  Damage to the  area around your knee.  Allergic reaction to any of the medicines.  Skin reactions from repeated injections.  What happens before the procedure?  Ask your health care provider about changing or stopping your regular medicines. This is especially important if you are taking diabetes medicines or blood thinners.  Plan to have someone take you home after the procedure. What happens during the  procedure?  You will sit or lie down in a position for your knee to be treated.  The skin over your kneecap will be cleaned with a germ-killing solution (antiseptic).  You will be given a medicine that numbs the area (local anesthetic). You may feel some stinging.  After your knee becomes numb, you will have a second injection. This is the medicine. This needle is carefully placed between your kneecap and your knee. The medicine is injected into the joint space.  At the end of the procedure, the needle will be removed.  A bandage (dressing) may be placed over the injection site. The procedure may vary among health care providers and hospitals. What happens after the procedure?  You may have to move your knee through its full range of motion. This helps to get all of the medicine into your joint space.  Your blood pressure, heart rate, breathing rate, and blood oxygen level will be monitored often until the medicines you were given have worn off.  You will be watched to make sure that you do not have a reaction to the injected medicine. This information is not intended to replace advice given to you by your health care provider. Make sure you discuss any questions you have with your health care provider. Document Released: 06/07/2006 Document Revised: 08/16/2015 Document Reviewed: 01/24/2014 Elsevier Interactive Patient Education  2018 [employer]  What are the risk, side effects and possible complications? Generally speaking, most procedures are safe.  However, with any procedure there are risks, side effects, and the possibility of complications.  The risks and complications are dependent upon the sites that are lesioned, or the type of nerve block to be performed.  The closer the procedure is to the spine, the more serious the risks are.  Great care is taken when placing the radio frequency needles, block needles or lesioning probes, but sometimes  complications can occur. 1. Infection: Any time there is an injection through the skin, there is a risk of infection.  This is why sterile conditions are used for these blocks.  There are four possible types of infection. 1. Localized skin infection. 2. Central Nervous System Infection-This can be in the form of Meningitis, which can be deadly. 3. Epidural Infections-This can be in the form of an epidural abscess, which can cause pressure inside of the spine, causing compression of the spinal cord with subsequent paralysis. This would require an emergency surgery to decompress, and there are no guarantees that the patient would recover from the paralysis. 4. Discitis-This is an infection of the intervertebral discs.  It occurs in about 1% of discography procedures.  It is difficult to treat and it may lead to surgery.        2. Pain: the needles have to go through skin and soft tissues, will cause soreness.       3. Damage to internal structures:  The nerves to be lesioned may be near blood vessels or    other nerves which can be potentially damaged.       4. Bleeding: Bleeding is more  common if the patient is taking blood thinners such as  aspirin, Coumadin, Ticiid, Plavix, etc., or if he/she have some genetic predisposition  such as hemophilia. Bleeding into the spinal canal can cause compression of the spinal  cord with subsequent paralysis.  This would require an emergency surgery to  decompress and there are no guarantees that the patient would recover from the  paralysis.       5. Pneumothorax:  Puncturing of a lung is a possibility, every time a needle is introduced in  the area of the chest or upper back.  Pneumothorax refers to free air around the  collapsed lung(s), inside of the thoracic cavity (chest cavity).  Another two possible  complications related to a similar event would include: Hemothorax and Chylothorax.   These are variations of the Pneumothorax, where instead of air around the  collapsed  lung(s), you may have blood or chyle, respectively.       6. Spinal headaches: They may occur with any procedures in the area of the spine.       7. Persistent CSF (Cerebro-Spinal Fluid) leakage: This is a rare problem, but may occur  with prolonged intrathecal or epidural catheters either due to the formation of a fistulous  track or a dural tear.       8. Nerve damage: By working so close to the spinal cord, there is always a possibility of  nerve damage, which could be as serious as a permanent spinal cord injury with  paralysis.       9. Death:  Although rare, severe deadly allergic reactions known as "Anaphylactic  reaction" can occur to any of the medications used.      10. Worsening of the symptoms:  We can always make thing worse.  What are the chances of something like this happening? Chances of any of this occuring are extremely low.  By statistics, you have more of a chance of getting killed in a motor vehicle accident: while driving to the hospital than any of the above occurring .  Nevertheless, you should be aware that they are possibilities.  In general, it is similar to taking a shower.  Everybody knows that you can slip, hit your head and get killed.  Does that mean that you should not shower again?  Nevertheless always keep in mind that statistics do not mean anything if you happen to be on the wrong side of them.  Even if a procedure has a 1 (one) in a 1,000,000 (million) chance of going wrong, it you happen to be that one..Also, keep in mind that by statistics, you have more of a chance of having something go wrong when taking medications.  Who should not have this procedure? If you are on a blood thinning medication (e.g. Coumadin, Plavix, see list of "Blood Thinners"), or if you have an active infection going on, you should not have the procedure.  If you are taking any blood thinners, please inform your physician.  How should I prepare for this procedure?  Do not eat  or drink anything at least six hours prior to the procedure.  Bring a driver with you .  It cannot be a taxi.  Come accompanied by an adult that can drive you back, and that is strong enough to help you if your legs get weak or numb from the local anesthetic.  Take all of your medicines the morning of the procedure with just enough water to swallow them.  If  you have diabetes, make sure that you are scheduled to have your procedure done first thing in the morning, whenever possible.  If you have diabetes, take only half of your insulin dose and notify our nurse that you have done so as soon as you arrive at the clinic.  If you are diabetic, but only take blood sugar pills (oral hypoglycemic), then do not take them on the morning of your procedure.  You may take them after you have had the procedure.  Do not take aspirin or any aspirin-containing medications, at least eleven (11) days prior to the procedure.  They may prolong bleeding.  Wear loose fitting clothing that may be easy to take off and that you would not mind if it got stained with Betadine or blood.  Do not wear any jewelry or perfume  Remove any nail coloring.  It will interfere with some of our monitoring equipment.  NOTE: Remember that this is not meant to be interpreted as a complete list of all possible complications.  Unforeseen problems may occur.  BLOOD THINNERS The following drugs contain aspirin or other products, which can cause increased bleeding during surgery and should not be taken for 2 weeks prior to and 1 week after surgery.  If you should need take something for relief of minor pain, you may take acetaminophen which is found in Tylenol,m Datril, Anacin-3 and Panadol. It is not blood thinner. The products listed below are.  Do not take any of the products listed below in addition to any listed on your instruction sheet.  A.P.C or A.P.C with Codeine Codeine Phosphate Capsules #3 Ibuprofen Ridaura  ABC compound  Congesprin Imuran rimadil  Advil Cope Indocin Robaxisal  Alka-Seltzer Effervescent Pain Reliever and Antacid Coricidin or Coricidin-D  Indomethacin Rufen  Alka-Seltzer plus Cold Medicine Cosprin Ketoprofen S-A-C Tablets  Anacin Analgesic Tablets or Capsules Coumadin Korlgesic Salflex  Anacin Extra Strength Analgesic tablets or capsules CP-2 Tablets Lanoril Salicylate  Anaprox Cuprimine Capsules Levenox Salocol  Anexsia-D Dalteparin Magan Salsalate  Anodynos Darvon compound Magnesium Salicylate Sine-off  Ansaid Dasin Capsules Magsal Sodium Salicylate  Anturane Depen Capsules Marnal Soma  APF Arthritis pain formula Dewitt's Pills Measurin Stanback  Argesic Dia-Gesic Meclofenamic Sulfinpyrazone  Arthritis Bayer Timed Release Aspirin Diclofenac Meclomen Sulindac  Arthritis pain formula Anacin Dicumarol Medipren Supac  Analgesic (Safety coated) Arthralgen Diffunasal Mefanamic Suprofen  Arthritis Strength Bufferin Dihydrocodeine Mepro Compound Suprol  Arthropan liquid Dopirydamole Methcarbomol with Aspirin Synalgos  ASA tablets/Enseals Disalcid Micrainin Tagament  Ascriptin Doan's Midol Talwin  Ascriptin A/D Dolene Mobidin Tanderil  Ascriptin Extra Strength Dolobid Moblgesic Ticlid  Ascriptin with Codeine Doloprin or Doloprin with Codeine Momentum Tolectin  Asperbuf Duoprin Mono-gesic Trendar  Aspergum Duradyne Motrin or Motrin IB Triminicin  Aspirin plain, buffered or enteric coated Durasal Myochrisine Trigesic  Aspirin Suppositories Easprin Nalfon Trillsate  Aspirin with Codeine Ecotrin Regular or Extra Strength Naprosyn Uracel  Atromid-S Efficin Naproxen Ursinus  Auranofin Capsules Elmiron Neocylate Vanquish  Axotal Emagrin Norgesic Verin  Azathioprine Empirin or Empirin with Codeine Normiflo Vitamin E  Azolid Emprazil Nuprin Voltaren  Bayer Aspirin plain, buffered or children's or timed BC Tablets or powders Encaprin Orgaran Warfarin Sodium  Buff-a-Comp Enoxaparin Orudis Zorpin   Buff-a-Comp with Codeine Equegesic Os-Cal-Gesic   Buffaprin Excedrin plain, buffered or Extra Strength Oxalid   Bufferin Arthritis Strength Feldene Oxphenbutazone   Bufferin plain or Extra Strength Feldene Capsules Oxycodone with Aspirin   Bufferin with Codeine Fenoprofen Fenoprofen Pabalate or Pabalate-SF   Buffets II Flogesic  Panagesic   Buffinol plain or Extra Strength Florinal or Florinal with Codeine Panwarfarin   Buf-Tabs Flurbiprofen Penicillamine   Butalbital Compound Four-way cold tablets Penicillin   Butazolidin Fragmin Pepto-Bismol   Carbenicillin Geminisyn Percodan   Carna Arthritis Reliever Geopen Persantine   Carprofen Gold's salt Persistin   Chloramphenicol Goody's Phenylbutazone   Chloromycetin Haltrain Piroxlcam   Clmetidine heparin Plaquenil   Cllnoril Hyco-pap Ponstel   Clofibrate Hydroxy chloroquine Propoxyphen         Before stopping any of these medications, be sure to consult the physician who ordered them.  Some, such as Coumadin (Warfarin) are ordered to prevent or treat serious conditions such as "deep thrombosis", "pumonary embolisms", and other heart problems.  The amount of time that you may need off of the medication may also vary with the medication and the reason for which you were taking it.  If you are taking any of these medications, please make sure you notify your pain physician before you undergo any procedures.

## 2016-10-07 NOTE — Progress Notes (Signed)
Safety precautions to be maintained throughout the outpatient stay will include: orient to surroundings, keep bed in low position, maintain call bell within reach at all times, provide assistance with transfer out of bed and ambulation.  

## 2016-10-08 ENCOUNTER — Telehealth: Payer: Self-pay

## 2016-10-08 NOTE — Telephone Encounter (Signed)
No answer- no answering machine set up to leave a message

## 2016-10-20 ENCOUNTER — Telehealth: Payer: Self-pay | Admitting: *Deleted

## 2016-10-21 ENCOUNTER — Ambulatory Visit: Payer: BLUE CROSS/BLUE SHIELD | Admitting: Pain Medicine

## 2016-10-21 ENCOUNTER — Other Ambulatory Visit: Payer: Self-pay | Admitting: Pain Medicine

## 2016-10-21 ENCOUNTER — Telehealth: Payer: Self-pay | Admitting: Pain Medicine

## 2016-10-21 DIAGNOSIS — M170 Bilateral primary osteoarthritis of knee: Secondary | ICD-10-CM

## 2016-10-21 DIAGNOSIS — M150 Primary generalized (osteo)arthritis: Principal | ICD-10-CM

## 2016-10-21 DIAGNOSIS — M159 Polyosteoarthritis, unspecified: Secondary | ICD-10-CM

## 2016-10-21 DIAGNOSIS — M1611 Unilateral primary osteoarthritis, right hip: Secondary | ICD-10-CM

## 2016-10-21 DIAGNOSIS — M8949 Other hypertrophic osteoarthropathy, multiple sites: Secondary | ICD-10-CM

## 2016-10-21 MED ORDER — DICLOFENAC SODIUM 1 % TD GEL: 4.0000 g | Freq: Four times a day (QID) | TRANSDERMAL | 2 refills | Status: AC

## 2016-10-21 NOTE — Telephone Encounter (Signed)
Shawn from [facility] is calling about Diclofenac. Insurance does not want to pay for this. Please call [phone]

## 2016-10-21 NOTE — Telephone Encounter (Signed)
Spoke with Shawn at pharmacy and he states that the insurance would cover Voltaren gel but not the Diclofenac.  Dr Dossie Arbour states he will order the Voltaren gel instead.

## 2016-12-25 ENCOUNTER — Ambulatory Visit (INDEPENDENT_AMBULATORY_CARE_PROVIDER_SITE_OTHER): Payer: BLUE CROSS/BLUE SHIELD

## 2016-12-25 DIAGNOSIS — Z23 Encounter for immunization: Secondary | ICD-10-CM

## 2017-01-01 ENCOUNTER — Ambulatory Visit (INDEPENDENT_AMBULATORY_CARE_PROVIDER_SITE_OTHER): Payer: BLUE CROSS/BLUE SHIELD | Admitting: Family Medicine

## 2017-01-01 ENCOUNTER — Encounter: Payer: Self-pay | Admitting: Family Medicine

## 2017-01-01 VITALS — BP 122/75 | HR 88 | Temp 98.0°F | Wt 266.8 lb

## 2017-01-01 DIAGNOSIS — R002 Palpitations: Secondary | ICD-10-CM

## 2017-01-01 DIAGNOSIS — H66002 Acute suppurative otitis media without spontaneous rupture of ear drum, left ear: Secondary | ICD-10-CM | POA: Diagnosis not present

## 2017-01-01 MED ORDER — AMOXICILLIN 875 MG PO TABS: 875.0000 mg | ORAL_TABLET | Freq: Two times a day (BID) | ORAL | 0 refills | Status: DC

## 2017-01-01 MED ORDER — ONDANSETRON 4 MG PO TBDP: 4.0000 mg | ORAL_TABLET | Freq: Three times a day (TID) | ORAL | 0 refills | Status: DC | PRN

## 2017-01-01 MED ORDER — MECLIZINE HCL 25 MG PO TABS: 25.0000 mg | ORAL_TABLET | Freq: Three times a day (TID) | ORAL | 0 refills | Status: DC | PRN

## 2017-01-01 NOTE — Progress Notes (Signed)
BP 122/75 (BP Location: Right Arm, Patient Position: Sitting, Cuff Size: Large)   Pulse 88   Temp 98 F (36.7 C)   Wt 266 lb 12.8 oz (121 kg)   LMP 04/15/2015 (Exact Date)   SpO2 99%   BMI 40.57 kg/m    Subjective:    Patient ID: Cathy Mitchell, female    DOB: December 06, 1965, 51 y.o.   MRN: [id number]  HPI: Cathy Mitchell is a 51 y.o. female  Chief Complaint  Patient presents with  . Ear Pain    Left ear. X's 1.5 weeks.   . Dizziness   Left ear pain and pressure x 1.5 weeks. Some muffled hearing, no known fevers or chills. The past few days has also been experiencing room spinning dizziness and nausea. Denies congestion, cough, SOB. Not taking anything OTC for sxs.   Intermittent palpitations keeping her up at night x 1.5 years. Has had EKGs in the past which were stable from previous with no acute findings. The sxs appear to be positional, only occurring when laying in a certain position and relieved by leaving the position. Very concerned about these sxs and would like further work-up. Denies diaphoresis, CP, SOB with these episodes.   Past Medical History:  Diagnosis Date  . Anxiety   . Arthritis   . Fibromyalgia   . Hypertension    no longer has hypertension  . Neuromuscular disorder ([place])   . Sleep apnea    does not use C-PAP   Social History   Social History  . Marital status: Married    Spouse name: N/A  . Number of children: N/A  . Years of education: N/A   Occupational History  . Not on file.   Social History Main Topics  . Smoking status: Former Smoker    Packs/day: 0.50    Years: 29.00    Types: Cigarettes    Quit date: 01/29/2015  . Smokeless tobacco: Never Used  . Alcohol use No  . Drug use: No  . Sexual activity: Yes   Other Topics Concern  . Not on file   Social History Narrative  . No narrative on file   Relevant past medical, surgical, family and social history reviewed and updated as indicated. Interim medical history since our last  visit reviewed. Allergies and medications reviewed and updated.  Review of Systems  Constitutional: Negative.   HENT: Positive for ear pain and hearing loss.   Eyes: Negative.   Cardiovascular: Positive for palpitations.  Gastrointestinal: Negative.   Musculoskeletal: Negative.   Neurological: Positive for dizziness.  Psychiatric/Behavioral: Negative.    Per HPI unless specifically indicated above     Objective:    BP 122/75 (BP Location: Right Arm, Patient Position: Sitting, Cuff Size: Large)   Pulse 88   Temp 98 F (36.7 C)   Wt 266 lb 12.8 oz (121 kg)   LMP 04/15/2015 (Exact Date)   SpO2 99%   BMI 40.57 kg/m   Wt Readings from Last 3 Encounters:  01/01/17 266 lb 12.8 oz (121 kg)  10/07/16 256 lb (116.1 kg)  09/23/16 265 lb (120.2 kg)    Physical Exam  Constitutional: She appears well-developed and well-nourished. No distress.  HENT:  Head: Atraumatic.  Right Ear: External ear normal.  Left Ear: External ear normal.  Nose: Nose normal.  Mouth/Throat: Oropharynx is clear and moist. No oropharyngeal exudate.  Right TM bulging, injected, with thick white fluid behind membrane  Eyes: Pupils are equal, round, and  reactive to light. Conjunctivae are normal. No scleral icterus.  Neck: Normal range of motion. Neck supple.  Cardiovascular: Normal rate and normal heart sounds.   Pulmonary/Chest: Effort normal and breath sounds normal.  Musculoskeletal: Normal range of motion.  Neurological: She is alert.  Skin: Skin is warm and dry.  Psychiatric: She has a normal mood and affect. Her behavior is normal.  Nursing note and vitals reviewed.   Results for orders placed or performed during the hospital encounter of 08/04/16  C-reactive protein  Result Value Ref Range   CRP <0.8 <1.0 mg/dL  Magnesium  Result Value Ref Range   Magnesium 2.2 1.7 - 2.4 mg/dL  Sedimentation rate  Result Value Ref Range   Sed Rate 15 0 - 20 mm/hr  Vitamin B12  Result Value Ref Range    Vitamin B-12 457 180 - 914 pg/mL  25-Hydroxyvitamin D Lcms D2+D3  Result Value Ref Range   25-Hydroxy, Vitamin D 32 ng/mL   25-Hydroxy, Vitamin D-2 2.1 ng/mL   25-Hydroxy, Vitamin D-3 30 ng/mL      Assessment & Plan:   Problem List Items Addressed This Visit    None    Visit Diagnoses    Intermittent palpitations    -  Primary   Will refer to Cardiology to discuss event monitor in hopes of catching one of the "spells". In meantime, avoid triggering position. Return precautions reviewed   Relevant Orders   Ambulatory referral to Cardiology   Acute suppurative otitis media of left ear without spontaneous rupture of tympanic membrane, recurrence not specified       Amoxicillin and flonase for ear sxs, meclizine for the secondary vertigo. Rest, push fluids, tylenol prn for pain relief. F/u if no improvement   Relevant Medications   amoxicillin (AMOXIL) 875 MG tablet       Follow up plan: Return for as scheduled.

## 2017-01-03 NOTE — Patient Instructions (Signed)
Follow up as scheduled.  

## 2017-02-02 ENCOUNTER — Ambulatory Visit
Admission: RE | Admit: 2017-02-02 | Discharge: 2017-02-02 | Disposition: A | Discharge status: Home / Self Care | Payer: BLUE CROSS/BLUE SHIELD | Source: Ambulatory Visit | Attending: Unknown Physician Specialty | Admitting: Unknown Physician Specialty

## 2017-02-02 ENCOUNTER — Encounter: Payer: Self-pay | Admitting: *Deleted

## 2017-02-02 ENCOUNTER — Other Ambulatory Visit: Payer: Self-pay

## 2017-02-02 ENCOUNTER — Emergency Department: Payer: BLUE CROSS/BLUE SHIELD

## 2017-02-02 ENCOUNTER — Emergency Department
Admission: EM | Admit: 2017-02-02 | Discharge: 2017-02-02 | Disposition: A | Discharge status: Home / Self Care | Payer: BLUE CROSS/BLUE SHIELD | Attending: Student in an Organized Health Care Education/Training Program | Admitting: Student in an Organized Health Care Education/Training Program

## 2017-02-02 DIAGNOSIS — Z87891 Personal history of nicotine dependence: Secondary | ICD-10-CM | POA: Insufficient documentation

## 2017-02-02 DIAGNOSIS — F1190 Opioid use, unspecified, uncomplicated: Secondary | ICD-10-CM | POA: Diagnosis not present

## 2017-02-02 DIAGNOSIS — F419 Anxiety disorder, unspecified: Secondary | ICD-10-CM | POA: Diagnosis not present

## 2017-02-02 DIAGNOSIS — N181 Chronic kidney disease, stage 1: Secondary | ICD-10-CM | POA: Diagnosis not present

## 2017-02-02 DIAGNOSIS — Z1231 Encounter for screening mammogram for malignant neoplasm of breast: Secondary | ICD-10-CM | POA: Diagnosis not present

## 2017-02-02 DIAGNOSIS — I129 Hypertensive chronic kidney disease with stage 1 through stage 4 chronic kidney disease, or unspecified chronic kidney disease: Secondary | ICD-10-CM | POA: Insufficient documentation

## 2017-02-02 DIAGNOSIS — Z9884 Bariatric surgery status: Secondary | ICD-10-CM | POA: Insufficient documentation

## 2017-02-02 DIAGNOSIS — R079 Chest pain, unspecified: Secondary | ICD-10-CM | POA: Diagnosis not present

## 2017-02-02 DIAGNOSIS — R002 Palpitations: Secondary | ICD-10-CM

## 2017-02-02 DIAGNOSIS — Z0000 Encounter for general adult medical examination without abnormal findings: Secondary | ICD-10-CM | POA: Diagnosis not present

## 2017-02-02 DIAGNOSIS — Z79899 Other long term (current) drug therapy: Secondary | ICD-10-CM | POA: Diagnosis not present

## 2017-02-02 LAB — BASIC METABOLIC PANEL
ANION GAP: 8 (ref 5–15)
BUN: 15 mg/dL (ref 6–20)
CALCIUM: 9.1 mg/dL (ref 8.9–10.3)
CO2: 23 mmol/L (ref 22–32)
Chloride: 105 mmol/L (ref 101–111)
Creatinine, Ser: 0.62 mg/dL (ref 0.44–1.00)
GFR calc Af Amer: >60 mL/min (ref >60)
GFR calc non Af Amer: >60 mL/min (ref >60)
GLUCOSE: 98 mg/dL (ref 65–99)
Potassium: 3.8 mmol/L (ref 3.5–5.1)
Sodium: 136 mmol/L (ref 135–145)

## 2017-02-02 LAB — CBC
HEMATOCRIT: 39.6 % (ref 35.0–47.0)
HEMOGLOBIN: 13.3 g/dL (ref 12.0–16.0)
MCH: 29.1 pg (ref 26.0–34.0)
MCHC: 33.5 g/dL (ref 32.0–36.0)
MCV: 86.8 fL (ref 80.0–100.0)
Platelets: 341 10*3/uL (ref 150–440)
RBC: 4.56 MIL/uL (ref 3.80–5.20)
RDW: 13.9 % (ref 11.5–14.5)
WBC: 8.5 10*3/uL (ref 3.6–11.0)

## 2017-02-02 LAB — TROPONIN I: Troponin I: <0.03 ng/mL (ref <0.03)

## 2017-02-02 NOTE — ED Provider Notes (Signed)
[facility] Emergency Department Provider Note    First MD Initiated Contact with Patient 02/02/17 1614     (approximate)  I have reviewed the triage vital signs and the nursing notes.   HISTORY  Chief Complaint Palpitations    HPI Cathy Mitchell is a 51 y.o. female history of anxiety, arthritis, fibromyalgia as well as a history of palpitations with future scheduled Holter monitor presents with feeling of palpitations and chest pressure associated with nausea dizziness and lightheadedness while she was at the gym this morning around 315.  Patient denies any chest pain or shortness of breath at this time.  Patient was not initially and come to the ER but was here for then decided to check and because it was concerning her.  Since being in the ER denies any palpitations.  Past Medical History:  Diagnosis Date  . Anxiety   . Arthritis   . Fibromyalgia   . Hypertension    no longer has hypertension  . Neuromuscular disorder ([place])   . Sleep apnea    does not use C-PAP   Family History  Problem Relation Age of Onset  . COPD Father   . Thyroid disease Daughter   . Eczema Son   . Heart disease Maternal Grandfather   . Diabetes Paternal Grandfather   . Heart disease Paternal Grandfather    Past Surgical History:  Procedure Laterality Date  . DILATION AND CURETTAGE OF UTERUS    . GANGLION CYST EXCISION Bilateral   . GASTRIC BYPASS    . HAND SURGERY Bilateral Resection  . TUBAL LIGATION     Patient Active Problem List   Diagnosis Date Noted  . Occipital headaches (Bilateral) (L>R) 08/27/2016  . Cervicogenic headache (Bilateral) (L>R) 08/27/2016  . Tricompartment osteoarthritis of knee (Right) 08/04/2016  . Osteoarthritis of knee (Right) 08/04/2016  . Osteoarthritis 08/04/2016  . Long term (current) use of opiate analgesic 08/04/2016  . Long term prescription opiate use 08/04/2016  . Opiate use 08/04/2016  . Chronic knee pain (Location of Primary  Source of Pain) (Bilateral) (R>L) 08/04/2016  . Osteoarthritis of knee (Bilateral) (R>L) 08/04/2016  . Chronic neck pain (Location of Secondary source of pain) (Bilateral) 08/04/2016  . Osteoarthritis of hip (Right) 08/04/2016  . Eczema of right hand 07/31/2016  . Chronic knee pain (Right) 07/17/2016  . Wheat allergy 02/12/2016  . Chronic hip pain (Location of Tertiary source of pain) (Right) 02/12/2016  . Pain management 02/12/2016  . SOB (shortness of breath) 11/13/2015  . Rosacea 04/29/2015  . Numbness 12/03/2014  . Chronic kidney disease, stage 1, normal or increased GFR 10/15/2014  . Sleep apnea 09/11/2014  . Extreme obesity 09/11/2014  . Primary generalized hypertrophic osteoarthrosis 09/10/2014  . Obstructive sleep apnea 09/10/2014  . Chronic pain syndrome 09/10/2014  . Irritable bowel syndrome 09/10/2014  . Tobacco abuse 09/10/2014  . Acute anxiety 09/10/2014  . Fibromyalgia 09/10/2014      Prior to Admission medications   Medication Sig Start Date End Date Taking? Authorizing Provider  acetaminophen (TYLENOL) 325 MG tablet Take by mouth.    [provider]  amoxicillin (AMOXIL) 875 MG tablet Take 1 tablet (875 mg total) by mouth 2 (two) times daily. 01/01/17   Volney American, PA-C  calcium citrate-vitamin D (CITRACAL+D) 315-200 MG-UNIT tablet Take 1 tablet by mouth daily.    [provider]  cholecalciferol (VITAMIN D) 1000 units tablet Take 1,000 Units by mouth daily.    [provider]  Ferrous Sulfate (IRON SUPPLEMENT PO) Take 1 tablet by mouth every other day.    [provider]  Magnesium 100 MG CAPS Take by mouth.    [provider]  meclizine (ANTIVERT) 25 MG tablet Take 1 tablet (25 mg total) by mouth 3 (three) times daily as needed for dizziness. 01/01/17   Volney American, PA-C  Multiple Vitamins-Minerals (MULTIPLE VITAMINS/WOMENS) tablet Take 1 tablet by mouth daily.    [provider]    ondansetron (ZOFRAN ODT) 4 MG disintegrating tablet Take 1 tablet (4 mg total) by mouth every 8 (eight) hours as needed. 01/01/17   Volney American, PA-C  Potassium 75 MG TABS Take by mouth.    [provider]  vitamin B-12 (CYANOCOBALAMIN) 500 MCG tablet Take 500 mcg by mouth daily.    [provider]  vitamin C (ASCORBIC ACID) 500 MG tablet Take 500 mg by mouth daily.    [provider]  Zinc 50 MG TABS Take 1 tablet by mouth daily.    [provider]    Allergies Oxycodone-acetaminophen; Nsaids; Other; and Percocet [oxycodone-acetaminophen]    Social History Social History   Tobacco Use  . Smoking status: Former Smoker    Packs/day: 0.50    Years: 29.00    Pack years: 14.50    Types: Cigarettes    Last attempt to quit: 01/29/2015    Years since quitting: 2.0  . Smokeless tobacco: Never Used  Substance Use Topics  . Alcohol use: No    Alcohol/week: 0.0 oz  . Drug use: No    Review of Systems Patient denies headaches, rhinorrhea, blurry vision, numbness, shortness of breath, chest pain, edema, cough, abdominal pain, nausea, vomiting, diarrhea, dysuria, fevers, rashes or hallucinations unless otherwise stated above in HPI. ____________________________________________   PHYSICAL EXAM:  VITAL SIGNS: Vitals:   02/02/17 1433  BP: 126/73  Pulse: 87  Resp: 18  Temp: 98.4 F (36.9 C)  SpO2: 97%    Constitutional: Alert and oriented. Well appearing and in no acute distress. Eyes: Conjunctivae are normal.  Head: Atraumatic. Nose: No congestion/rhinnorhea. Mouth/Throat: Mucous membranes are moist.   Neck: No stridor. Painless ROM.  Cardiovascular: Normal rate, regular rhythm. Grossly normal heart sounds.  Good peripheral circulation. Respiratory: Normal respiratory effort.  No retractions. Lungs CTAB. Gastrointestinal: Soft and nontender. No distention. No abdominal bruits. No CVA tenderness. Genitourinary:  Musculoskeletal:  No lower extremity tenderness nor edema.  No joint effusions. Neurologic:  Normal speech and language. No gross focal neurologic deficits are appreciated. No facial droop Skin:  Skin is warm, dry and intact. No rash noted. Psychiatric: Mood and affect are normal. Speech and behavior are normal.  ____________________________________________   LABS (all labs ordered are listed, but only abnormal results are displayed)  Results for orders placed or performed during the hospital encounter of 02/02/17 (from the past 24 hour(s))  Basic metabolic panel     Status: None   Collection Time: 02/02/17  2:32 PM  Result Value Ref Range   Sodium 136 135 - 145 mmol/L   Potassium 3.8 3.5 - 5.1 mmol/L   Chloride 105 101 - 111 mmol/L   CO2 23 22 - 32 mmol/L   Glucose, Bld 98 65 - 99 mg/dL   BUN 15 6 - 20 mg/dL   Creatinine, Ser 0.62 0.44 - 1.00 mg/dL   Calcium 9.1 8.9 - 10.3 mg/dL   GFR calc non Af Amer >60 >60 mL/min   GFR calc Af Amer >60 >  60 mL/min   Anion gap 8 5 - 15  CBC     Status: None   Collection Time: 02/02/17  2:32 PM  Result Value Ref Range   WBC 8.5 3.6 - 11.0 K/uL   RBC 4.56 3.80 - 5.20 MIL/uL   Hemoglobin 13.3 12.0 - 16.0 g/dL   HCT 39.6 35.0 - 47.0 %   MCV 86.8 80.0 - 100.0 fL   MCH 29.1 26.0 - 34.0 pg   MCHC 33.5 32.0 - 36.0 g/dL   RDW 13.9 11.5 - 14.5 %   Platelets 341 150 - 440 K/uL  Troponin I     Status: None   Collection Time: 02/02/17  2:32 PM  Result Value Ref Range   Troponin I <0.03 <0.03 ng/mL   ____________________________________________  EKG My review and personal interpretation at Time: 14:30   Indication: chest discomfort, palpitation  Rate: 80  Rhythm: sinus with occasional pvc Axis: normal Other: no stemi, pvc, normal intervals ____________________________________________  RADIOLOGY  I personally reviewed all radiographic images ordered to evaluate for the above acute complaints and reviewed radiology reports and findings.  These findings were  personally discussed with the patient.  Please see medical record for radiology report.  ____________________________________________   PROCEDURES  Procedure(s) performed:  Procedures    Critical Care performed: ACS, pericarditis, esophagitis, boerhaaves, pe, dissection, pna, bronchitis, costochondritis no ____________________________________________   INITIAL IMPRESSION / ASSESSMENT AND PLAN / ED COURSE  Pertinent labs & imaging results that were available during my care of the patient were reviewed by me and considered in my medical decision making (see chart for details).  DDX: ACS, pericarditis, esophagitis, boerhaaves, pe, dissection, pna, bronchitis, costochondritis   Cathy Mitchell is a 51 y.o. who presents to the ED with palpitations chest heaviness and symptoms as disc above.  She is currently well-appearing and hemodynamically stable.  EKG does show occasional PVC but no evidence of acute ischemia.  Her troponin is negative over 6 hours after onset of pain.  Do not feel this is clinically consistent with ACS.  Patient with a low risk heart score of 3.  Patient is low risk by [place].  Not clinically consistent with pulmonary embolism.  Chest x-ray shows no evidence of heart failure or pneumonia.  Do suspect some component of stress-induced symptoms versus symptomatic PVCs.  Patient is scheduled for outpatient workup with cardiology which I do believe is appropriate.  I do believe that she is appropriate and stable for further workup as an outpatient.      ____________________________________________   FINAL CLINICAL IMPRESSION(S) / ED DIAGNOSES  Final diagnoses:  Palpitations  Chest pain, unspecified type      NEW MEDICATIONS STARTED DURING THIS VISIT:  This SmartLink is deprecated. Use AVSMEDLIST instead to display the medication list for a patient.   Note:  This document was prepared using Dragon voice recognition software and may include unintentional  dictation errors.    Merlyn Lot, MD 02/02/17 [phone]

## 2017-02-02 NOTE — ED Notes (Signed)
Pt to ed with c/o "heart palpitations".  Pt states when she lays down her heart rate gets worse.  Pt reports she works out in gym without difficulty until last night.  Pt also reports mild sob, dizzy and lightheaded with the heart palpitations.

## 2017-02-02 NOTE — ED Triage Notes (Addendum)
States palpatations and chest heaviness, states hx left sided chest pain, states she felt very dizzy at the gym last night, states palpitations are worse while lying down, awake and alert in no acute distress

## 2017-03-02 ENCOUNTER — Ambulatory Visit (INDEPENDENT_AMBULATORY_CARE_PROVIDER_SITE_OTHER): Payer: BLUE CROSS/BLUE SHIELD | Admitting: Cardiovascular Disease

## 2017-03-02 ENCOUNTER — Encounter: Payer: Self-pay | Admitting: Cardiovascular Disease

## 2017-03-02 VITALS — BP 124/80 | HR 76 | Ht 69.0 in | Wt 270.5 lb

## 2017-03-02 DIAGNOSIS — R0602 Shortness of breath: Secondary | ICD-10-CM

## 2017-03-02 DIAGNOSIS — I493 Ventricular premature depolarization: Secondary | ICD-10-CM | POA: Diagnosis not present

## 2017-03-02 NOTE — Patient Instructions (Addendum)
Medication Instructions:  Your physician recommends that you continue on your current medications as directed. Please refer to the Current Medication list given to you today.   Labwork: none  Testing/Procedures: Your physician has recommended that you wear a holter monitor. Holter monitors are medical devices that record the heart's electrical activity. Doctors most often use these monitors to diagnose arrhythmias. Arrhythmias are problems with the speed or rhythm of the heartbeat. The monitor is a small, portable device. You can wear one while you do your normal daily activities. This is usually used to diagnose what is causing palpitations/syncope (passing out).  Your physician has requested that you have an echocardiogram. Echocardiography is a painless test that uses sound waves to create images of your heart. It provides your doctor with information about the size and shape of your heart and how well your heart's chambers and valves are working. This procedure takes approximately one hour. There are no restrictions for this procedure.    Follow-Up: Your physician recommends that you schedule a follow-up appointment in: 1 month with Dr. Fletcher Anon.    Any Other Special Instructions Will Be Listed Below (If Applicable).     If you need a refill on your cardiac medications before your next appointment, please call your pharmacy.  Echocardiogram An echocardiogram, or echocardiography, uses sound waves (ultrasound) to produce an image of your heart. The echocardiogram is simple, painless, obtained within a short period of time, and offers valuable information to your health care provider. The images from an echocardiogram can provide information such as:  Evidence of coronary artery disease (CAD).  Heart size.  Heart muscle function.  Heart valve function.  Aneurysm detection.  Evidence of a past heart attack.  Fluid buildup around the heart.  Heart muscle thickening.  Assess  heart valve function.  Tell a health care provider about:  Any allergies you have.  All medicines you are taking, including vitamins, herbs, eye drops, creams, and over-the-counter medicines.  Any problems you or family members have had with anesthetic medicines.  Any blood disorders you have.  Any surgeries you have had.  Any medical conditions you have.  Whether you are pregnant or may be pregnant. What happens before the procedure? No special preparation is needed. Eat and drink normally. What happens during the procedure?  In order to produce an image of your heart, gel will be applied to your chest and a wand-like tool (transducer) will be moved over your chest. The gel will help transmit the sound waves from the transducer. The sound waves will harmlessly bounce off your heart to allow the heart images to be captured in real-time motion. These images will then be recorded.  You may need an IV to receive a medicine that improves the quality of the pictures. What happens after the procedure? You may return to your normal schedule including diet, activities, and medicines, unless your health care provider tells you otherwise. This information is not intended to replace advice given to you by your health care provider. Make sure you discuss any questions you have with your health care provider. Document Released: 03/13/2000 Document Revised: 11/02/2015 Document Reviewed: 11/21/2012 Elsevier Interactive Patient Education  2017 [employer].  Holter Monitoring A Holter monitor is a small device that is used to detect abnormal heart rhythms. It clips to your clothing and is connected by wires to flat, sticky disks (electrodes) that attach to your chest. It is worn continuously for 24-48 hours. Follow these instructions at home:  Wear  your Holter monitor at all times, even while exercising and sleeping, for as long as directed by your health care provider.  Make sure that the  Holter monitor is safely clipped to your clothing or close to your body as recommended by your health care provider.  Do not get the monitor or wires wet.  Do not put body lotion or moisturizer on your chest.  Keep your skin clean.  Keep a diary of your daily activities, such as walking and doing chores. If you feel that your heartbeat is abnormal or that your heart is fluttering or skipping a beat: ? Record what you are doing when it happens. ? Record what time of day the symptoms occur.  Return your Holter monitor as directed by your health care provider.  Keep all follow-up visits as directed by your health care provider. This is important. Get help right away if:  You feel lightheaded or you faint.  You have trouble breathing.  You feel pain in your chest, upper arm, or jaw.  You feel sick to your stomach and your skin is pale, cool, or damp.  You heartbeat feels unusual or abnormal. This information is not intended to replace advice given to you by your health care provider. Make sure you discuss any questions you have with your health care provider. Document Released: 12/13/2003 Document Revised: 08/22/2015 Document Reviewed: 10/23/2013 Elsevier Interactive Patient Education  [employer].

## 2017-03-02 NOTE — Progress Notes (Signed)
Cardiology Office Note   Date:  03/02/2017   ID:  Cathy Mitchell, DOB 01-10-1966, MRN [id number]  PCP:  Kathrine Haddock, NP  Cardiologist:   Kathlyn Sacramento, MD   Chief Complaint  Patient presents with  . other    Pt. c/o palpitations and difficulty breathing. Meds reviewed by the pt. verbally.       History of Present Illness: Cathy Mitchell is a 51 y.o. female who was referred by Dr. Janae Bridgeman for evaluation of palpitations.  The patient has no prior cardiac history.  She underwent a treadmill stress test in September 2016 before having gastric bypass surgery.  The stress test was normal.  She has no history of diabetes or hyperlipidemia.  She used to have elevated blood pressure which has resolved and currently she is not on any medications.  She does have sleep apnea and refuses CPAP.  She has no family history of premature coronary artery disease.  She is not a smoker and does not consume excessive amount of caffeine.  She does not drink alcohol.  She reports prolonged history of intermittent palpitations for at least one year duration which has worsened recently.  He feels skipping in her heart associated with shortness of breath and mild dizziness.  She gets very anxious.  This is associated occasionally with sharp chest pain. She went to the emergency room recently for palpitations and had a negative basic workup. She is going through menopause with irregularities.    Past Medical History:  Diagnosis Date  . Anxiety   . Arthritis   . Fibromyalgia   . Hypertension    no longer has hypertension  . Neuromuscular disorder ([place])   . Sleep apnea    does not use C-PAP    Past Surgical History:  Procedure Laterality Date  . DILATION AND CURETTAGE OF UTERUS    . GANGLION CYST EXCISION Bilateral   . GANGLION CYST EXCISION Right 10/08/2015   Procedure: REMOVAL GANGLION OF WRIST;  Surgeon: Hessie Knows, MD;  Location: [facility] ORS;  Service: Orthopedics;  Laterality: Right;  .  GASTRIC BYPASS    . HAND SURGERY Bilateral Resection  . TUBAL LIGATION       Current Outpatient Medications  Medication Sig Dispense Refill  . acetaminophen (TYLENOL) 325 MG tablet Take by mouth.    Marland Kitchen amoxicillin (AMOXIL) 875 MG tablet Take 1 tablet (875 mg total) by mouth 2 (two) times daily. 20 tablet 0  . calcium citrate-vitamin D (CITRACAL+D) 315-200 MG-UNIT tablet Take 1 tablet by mouth daily.    . cholecalciferol (VITAMIN D) 1000 units tablet Take 1,000 Units by mouth daily.    . Ferrous Sulfate (IRON SUPPLEMENT PO) Take 1 tablet by mouth every other day.    . Magnesium 100 MG CAPS Take by mouth.    . meclizine (ANTIVERT) 25 MG tablet Take 1 tablet (25 mg total) by mouth 3 (three) times daily as needed for dizziness. 30 tablet 0  . Multiple Vitamins-Minerals (MULTIPLE VITAMINS/WOMENS) tablet Take 1 tablet by mouth daily.    . ondansetron (ZOFRAN ODT) 4 MG disintegrating tablet Take 1 tablet (4 mg total) by mouth every 8 (eight) hours as needed. 45 tablet 0  . Potassium 75 MG TABS Take by mouth.    . vitamin B-12 (CYANOCOBALAMIN) 500 MCG tablet Take 500 mcg by mouth daily.    . vitamin C (ASCORBIC ACID) 500 MG tablet Take 500 mg by mouth daily.    . Zinc 50 MG TABS  Take 1 tablet by mouth daily.     No current facility-administered medications for this visit.     Allergies:   Oxycodone-acetaminophen; Nsaids; Other; and Percocet [oxycodone-acetaminophen]    Social History:  The patient  reports that she quit smoking about 2 years ago. Her smoking use included cigarettes. She has a 14.50 pack-year smoking history. she has never used smokeless tobacco. She reports that she does not drink alcohol or use drugs.   Family History:  The patient's family history includes COPD in her father; Diabetes in her paternal grandfather; Eczema in her son; Heart disease in her maternal grandfather and paternal grandfather; Thyroid disease in her daughter.    ROS:  Please see the history of present  illness.   Otherwise, review of systems are positive for none.   All other systems are reviewed and negative.    PHYSICAL EXAM: VS:  BP 124/80 (BP Location: Left Arm, Patient Position: Sitting, Cuff Size: Large)   Pulse 76   Ht 5\' 9"  (1.753 m)   Wt 270 lb 8 oz (122.7 kg)   LMP 04/15/2015 (Exact Date)   BMI 39.95 kg/m  , BMI Body mass index is 39.95 kg/m. GEN: Well nourished, well developed, in no acute distress  HEENT: normal  Neck: no JVD, carotid bruits, or masses Cardiac: RRR with premature beats; no murmurs, rubs, or gallops,no edema  Respiratory:  clear to auscultation bilaterally, normal work of breathing GI: soft, nontender, nondistended, + BS MS: no deformity or atrophy  Skin: warm and dry, no rash Neuro:  Strength and sensation are intact Psych: euthymic mood, full affect   EKG:  EKG is ordered today. The ekg ordered today demonstrates normal sinus rhythm with frequent PVCs and incomplete right bundle branch block.   Recent Labs: 07/31/2016: ALT 17; TSH 1.060 08/04/2016: Magnesium 2.2 02/02/2017: BUN 15; Creatinine, Ser 0.62; Hemoglobin 13.3; Platelets 341; Potassium 3.8; Sodium 136    Lipid Panel    Component Value Date/Time   CHOL 145 07/31/2016 1110   TRIG 121 07/31/2016 1110   HDL 48 07/31/2016 1110   CHOLHDL 4.3 12/03/2014 1619   VLDL 27 12/03/2014 1619   LDLCALC 73 07/31/2016 1110      Wt Readings from Last 3 Encounters:  03/02/17 270 lb 8 oz (122.7 kg)  02/02/17 265 lb (120.2 kg)  01/01/17 266 lb 12.8 oz (121 kg)       PAD Screen 03/02/2017  Previous PAD dx? No  Previous surgical procedure? No  Pain with walking? No  Feet/toe relief with dangling? No  Painful, non-healing ulcers? No  Extremities discolored? No      ASSESSMENT AND PLAN:  1.  Symptomatic PVCs: This seems to be the etiology for her palpitations.  She had labs done this year which were unremarkable including thyroid function.  She does not consume significant amount of  caffeine. I am going to obtain an echocardiogram to ensure no structural heart abnormalities. I also requested a 24-hour Holter monitor to quantify her PVCs.  I suspect that she will require treatment given the high burden of PVCs.    Disposition:   FU with me in 1 month  Signed,  Kathlyn Sacramento, MD  03/02/2017 3:12 PM    [place]

## 2017-03-10 ENCOUNTER — Ambulatory Visit (INDEPENDENT_AMBULATORY_CARE_PROVIDER_SITE_OTHER): Payer: BLUE CROSS/BLUE SHIELD

## 2017-03-10 DIAGNOSIS — I493 Ventricular premature depolarization: Secondary | ICD-10-CM

## 2017-03-11 ENCOUNTER — Encounter: Payer: Self-pay | Admitting: Family Medicine

## 2017-03-11 ENCOUNTER — Ambulatory Visit (INDEPENDENT_AMBULATORY_CARE_PROVIDER_SITE_OTHER): Payer: BLUE CROSS/BLUE SHIELD | Admitting: Family Medicine

## 2017-03-11 VITALS — BP 118/82 | HR 76 | Temp 97.7°F

## 2017-03-11 DIAGNOSIS — H6593 Unspecified nonsuppurative otitis media, bilateral: Secondary | ICD-10-CM | POA: Diagnosis not present

## 2017-03-11 DIAGNOSIS — R42 Dizziness and giddiness: Secondary | ICD-10-CM | POA: Diagnosis not present

## 2017-03-11 MED ORDER — FLUTICASONE PROPIONATE 50 MCG/ACT NA SUSP: 2.0000 | Freq: Two times a day (BID) | NASAL | 6 refills | Status: DC

## 2017-03-11 MED ORDER — CETIRIZINE HCL 10 MG PO TABS
10.0000 mg | ORAL_TABLET | Freq: Every day | ORAL | 11 refills | Status: DC
Start: 2017-03-11 — End: 2019-08-07

## 2017-03-11 NOTE — Progress Notes (Signed)
BP 118/82 (BP Location: Right Arm, Patient Position: Sitting, Cuff Size: Large)   Pulse 76   Temp 97.7 F (36.5 C) (Oral)   LMP 02/09/2017 (Within Weeks)   SpO2 97%    Subjective:    Patient ID: Cathy Mitchell, female    DOB: 21-Nov-1965, 51 y.o.   MRN: [id number]  HPI: Cathy Mitchell is a 51 y.o. female  Chief Complaint  Patient presents with  . Ear Pain    x's 3 days. Patient states the right ear has a "heart beat" (throbbing) and the left ear is sore.  . Dizziness    Patient states her equilibreium is off.   Patient presents today with b/l ear throbbing and pressure x 3 days. No sharp stabbing pains, muffled hearing, drainage, fevers, rhinorrhea. States she seems to be getting this intermittently several times a year. Not taking anything OTC for sxs. Also now having dizziness with certain motions, states sometimes she feels like her equilibrium is off since onset.   Past Medical History:  Diagnosis Date  . Anxiety   . Arthritis   . Fibromyalgia   . Hypertension    no longer has hypertension  . Neuromuscular disorder ([place])   . Sleep apnea    does not use C-PAP   Social History   Socioeconomic History  . Marital status: Married    Spouse name: Not on file  . Number of children: Not on file  . Years of education: Not on file  . Highest education level: Not on file  Social Needs  . Financial resource strain: Not on file  . Food insecurity - worry: Not on file  . Food insecurity - inability: Not on file  . Transportation needs - medical: Not on file  . Transportation needs - non-medical: Not on file  Occupational History  . Not on file  Tobacco Use  . Smoking status: Former Smoker    Packs/day: 0.50    Years: 29.00    Pack years: 14.50    Types: Cigarettes    Last attempt to quit: 01/29/2015    Years since quitting: 2.1  . Smokeless tobacco: Never Used  Substance and Sexual Activity  . Alcohol use: No    Alcohol/week: 0.0 oz  . Drug use: No  . Sexual  activity: Yes  Other Topics Concern  . Not on file  Social History Narrative  . Not on file   Relevant past medical, surgical, family and social history reviewed and updated as indicated. Interim medical history since our last visit reviewed. Allergies and medications reviewed and updated.  Review of Systems  Constitutional: Negative.   HENT: Positive for ear pain and postnasal drip. Negative for congestion and rhinorrhea.   Eyes: Negative.   Respiratory: Negative.   Cardiovascular: Negative.   Gastrointestinal: Negative.   Genitourinary: Negative.   Musculoskeletal: Negative.   Neurological: Positive for dizziness.  Psychiatric/Behavioral: Negative.     Per HPI unless specifically indicated above     Objective:    BP 118/82 (BP Location: Right Arm, Patient Position: Sitting, Cuff Size: Large)   Pulse 76   Temp 97.7 F (36.5 C) (Oral)   LMP 02/09/2017 (Within Weeks)   SpO2 97%   Wt Readings from Last 3 Encounters:  03/02/17 270 lb 8 oz (122.7 kg)  02/02/17 265 lb (120.2 kg)  01/01/17 266 lb 12.8 oz (121 kg)    Physical Exam  Constitutional: She is oriented to person, place, and time. She appears well-developed  and well-nourished. No distress.  HENT:  Head: Atraumatic.  Nose: Nose normal.  Mouth/Throat: Oropharynx is clear and moist. No oropharyngeal exudate.  B/l middle ear effusion with mild thickening of TMs Oropharynx mildly erythematous posteriorly  Eyes: Conjunctivae are normal. Pupils are equal, round, and reactive to light.  Neck: Normal range of motion. Neck supple.  Cardiovascular: Normal rate and intact distal pulses.  Pulmonary/Chest: Effort normal and breath sounds normal. No respiratory distress.  Musculoskeletal: Normal range of motion.  Lymphadenopathy:    She has no cervical adenopathy.  Neurological: She is alert and oriented to person, place, and time. No cranial nerve deficit.  Skin: Skin is warm and dry. No rash noted.  Psychiatric: She has a  normal mood and affect. Her behavior is normal.  Nursing note and vitals reviewed.     Assessment & Plan:   Problem List Items Addressed This Visit    None    Visit Diagnoses    Bilateral serous otitis media, unspecified chronicity    -  Primary   Appears chronic/intermittent,likely from seasonal allergies. Will start regular zyrtec and flonase regimen and monitor closely for benefit.    Dizziness       Suspect vertigo from elevated inner ear pressure. Offered meclizine, pt declined as it isn't often or very bothersome at this time.       Follow up plan: Return for as scheduled.

## 2017-03-12 ENCOUNTER — Other Ambulatory Visit: Payer: Self-pay

## 2017-03-12 ENCOUNTER — Ambulatory Visit (INDEPENDENT_AMBULATORY_CARE_PROVIDER_SITE_OTHER): Payer: BLUE CROSS/BLUE SHIELD

## 2017-03-12 DIAGNOSIS — R0602 Shortness of breath: Secondary | ICD-10-CM

## 2017-03-12 DIAGNOSIS — I493 Ventricular premature depolarization: Secondary | ICD-10-CM | POA: Diagnosis not present

## 2017-03-14 NOTE — Patient Instructions (Signed)
Follow up as needed

## 2017-03-16 ENCOUNTER — Ambulatory Visit: Payer: BLUE CROSS/BLUE SHIELD | Admitting: Family Medicine

## 2017-03-16 ENCOUNTER — Encounter: Payer: Self-pay | Admitting: Family Medicine

## 2017-03-16 VITALS — BP 124/85 | HR 76 | Temp 98.2°F

## 2017-03-16 DIAGNOSIS — H6523 Chronic serous otitis media, bilateral: Secondary | ICD-10-CM | POA: Diagnosis not present

## 2017-03-16 MED ORDER — PREDNISONE 10 MG PO TABS: ORAL_TABLET | ORAL | 0 refills | Status: DC

## 2017-03-16 NOTE — Progress Notes (Signed)
   BP 124/85   Pulse 76   Temp 98.2 F (36.8 C) (Oral)   SpO2 98%    Subjective:    Patient ID: Cathy Mitchell, female    DOB: [birth year], 51 y.o.   MRN: [id number]  HPI: Cathy Mitchell is a 51 y.o. female  Chief Complaint  Patient presents with  . Ear Pain    pt states her ear pain is no better, states she has done everything Cathy Mitchell told her to last week and nothing is better   Patient presents with persistent b/l ear pain. Has tried almost a week of zyrtec and flonase with no relief. This issue has been ongoing for months with muffled hearing, pressure, and popping. Patient is becoming very frustrated with this issue.   Relevant past medical, surgical, family and social history reviewed and updated as indicated. Interim medical history since our last visit reviewed. Allergies and medications reviewed and updated.  Review of Systems  Constitutional: Negative.   HENT: Positive for ear pain and hearing loss.   Respiratory: Negative.   Cardiovascular: Negative.   Gastrointestinal: Negative.   Musculoskeletal: Negative.   Neurological: Negative.   Psychiatric/Behavioral: Negative.    Per HPI unless specifically indicated above     Objective:    BP 124/85   Pulse 76   Temp 98.2 F (36.8 C) (Oral)   SpO2 98%   Wt Readings from Last 3 Encounters:  03/02/17 270 lb 8 oz (122.7 kg)  02/02/17 265 lb (120.2 kg)  01/01/17 266 lb 12.8 oz (121 kg)    Physical Exam  Constitutional: She is oriented to person, place, and time. She appears well-developed and well-nourished. No distress.  HENT:  Head: Atraumatic.  Persistent b/l middle ear effusion with mild thickening of the TMs  Eyes: Conjunctivae are normal. Pupils are equal, round, and reactive to light.  Neck: Normal range of motion. Neck supple.  Cardiovascular: Normal rate, regular rhythm and normal heart sounds.  Pulmonary/Chest: Effort normal and breath sounds normal.  Musculoskeletal: Normal range of motion.    Neurological: She is alert and oriented to person, place, and time.  Skin: Skin is warm and dry.  Psychiatric: She has a normal mood and affect. Her behavior is normal.  Nursing note and vitals reviewed.     Assessment & Plan:   Problem List Items Addressed This Visit    None    Visit Diagnoses    Bilateral chronic serous otitis media    -  Primary   Will add prednisone to zyrtec and flonase, also placing referral for ENT in case no improvement for further eval as she's failed conservative management   Relevant Orders   Ambulatory referral to ENT       Follow up plan: Return if symptoms worsen or fail to improve.

## 2017-03-17 ENCOUNTER — Ambulatory Visit
Admission: RE | Admit: 2017-03-17 | Discharge: 2017-03-17 | Disposition: A | Discharge status: Home / Self Care | Payer: BLUE CROSS/BLUE SHIELD | Source: Ambulatory Visit | Attending: Cardiovascular Disease | Admitting: Cardiovascular Disease

## 2017-03-17 DIAGNOSIS — I493 Ventricular premature depolarization: Secondary | ICD-10-CM | POA: Insufficient documentation

## 2017-03-18 ENCOUNTER — Encounter: Payer: Self-pay | Admitting: Unknown Physician Specialty

## 2017-03-18 ENCOUNTER — Other Ambulatory Visit: Payer: Self-pay

## 2017-03-18 MED ORDER — METOPROLOL TARTRATE 25 MG PO TABS: 25.0000 mg | ORAL_TABLET | Freq: Two times a day (BID) | ORAL | 1 refills | Status: DC

## 2017-03-18 NOTE — Patient Instructions (Signed)
Follow up as needed

## 2017-03-22 ENCOUNTER — Ambulatory Visit (INDEPENDENT_AMBULATORY_CARE_PROVIDER_SITE_OTHER): Payer: BLUE CROSS/BLUE SHIELD | Admitting: Unknown Physician Specialty

## 2017-03-22 ENCOUNTER — Encounter: Payer: Self-pay | Admitting: Unknown Physician Specialty

## 2017-03-22 ENCOUNTER — Other Ambulatory Visit: Payer: BLUE CROSS/BLUE SHIELD

## 2017-03-22 VITALS — BP 135/91 | HR 76 | Temp 98.0°F | Wt 270.0 lb

## 2017-03-22 DIAGNOSIS — N939 Abnormal uterine and vaginal bleeding, unspecified: Secondary | ICD-10-CM | POA: Diagnosis not present

## 2017-03-22 DIAGNOSIS — E669 Obesity, unspecified: Secondary | ICD-10-CM | POA: Insufficient documentation

## 2017-03-22 DIAGNOSIS — H1013 Acute atopic conjunctivitis, bilateral: Secondary | ICD-10-CM | POA: Diagnosis not present

## 2017-03-22 DIAGNOSIS — R350 Frequency of micturition: Secondary | ICD-10-CM | POA: Diagnosis not present

## 2017-03-22 MED ORDER — AZELASTINE HCL 0.05 % OP SOLN: 1.0000 [drp] | Freq: Two times a day (BID) | OPHTHALMIC | 12 refills | Status: DC

## 2017-03-22 NOTE — Progress Notes (Signed)
BP (!) 135/91   Pulse 76   Temp 98 F (36.7 C) (Oral)   Wt 270 lb (122.5 kg)   SpO2 97%   BMI 39.87 kg/m    Subjective:    Patient ID: Cathy Mitchell, female    DOB: 05/22/1965, 51 y.o.   MRN: [id number]  HPI: Cathy Mitchell is a 51 y.o. female  Chief Complaint  Patient presents with  . Urinary Tract Infection    pt states she has been having urinary frequency   Pt came in for discussion about vaginal bleeding she has experienced.  She states she is noting bleeding when she started increasing exercise intensity.  Initially it was spotting, but later started having heavier bleeding with full blown menstrual cycle with clots.  It has been years since having vaginal bleeding.  She is fearful of seeing an ObGyn due to a traumatic experience with an Ob and the birth of both children.    Urinary frequency Increased urinary frequency lately but no pain or burning.  Allergic conjunctivitis Itching and burning of bilateral eyes. Taking Flonase and Cetirizine.    Obesity S/p bariatric surgery.  Maintaining her weight and is frustrated.       Relevant past medical, surgical, family and social history reviewed and updated as indicated. Interim medical history since our last visit reviewed. Allergies and medications reviewed and updated.  Review of Systems  Constitutional: Negative.   HENT:       Ear pressure.  This is improving after last visit    Per HPI unless specifically indicated above     Objective:    BP (!) 135/91   Pulse 76   Temp 98 F (36.7 C) (Oral)   Wt 270 lb (122.5 kg)   SpO2 97%   BMI 39.87 kg/m   Wt Readings from Last 3 Encounters:  03/22/17 270 lb (122.5 kg)  03/02/17 270 lb 8 oz (122.7 kg)  02/02/17 265 lb (120.2 kg)    Physical Exam  Constitutional: She is oriented to person, place, and time. She appears well-developed and well-nourished. No distress.  HENT:  Head: Normocephalic and atraumatic.  Eyes: Lids are normal. Right eye exhibits no  discharge. Left eye exhibits no discharge. Right conjunctiva is injected. No scleral icterus.  Neck: Normal range of motion. Neck supple. No JVD present. Carotid bruit is not present.  Cardiovascular: Normal rate, regular rhythm and normal heart sounds.  Pulmonary/Chest: Effort normal and breath sounds normal.  Abdominal: Normal appearance. There is no splenomegaly or hepatomegaly.  Musculoskeletal: Normal range of motion.  Neurological: She is alert and oriented to person, place, and time.  Skin: Skin is warm, dry and intact. No rash noted. No pallor.  Psychiatric: She has a normal mood and affect. Her behavior is normal. Judgment and thought content normal.    Results for orders placed or performed during the hospital encounter of 23/55/73  Basic metabolic panel  Result Value Ref Range   Sodium 136 135 - 145 mmol/L   Potassium 3.8 3.5 - 5.1 mmol/L   Chloride 105 101 - 111 mmol/L   CO2 23 22 - 32 mmol/L   Glucose, Bld 98 65 - 99 mg/dL   BUN 15 6 - 20 mg/dL   Creatinine, Ser 0.62 0.44 - 1.00 mg/dL   Calcium 9.1 8.9 - 10.3 mg/dL   GFR calc non Af Amer >60 >60 mL/min   GFR calc Af Amer >60 >60 mL/min   Anion gap 8 5 -  15  CBC  Result Value Ref Range   WBC 8.5 3.6 - 11.0 K/uL   RBC 4.56 3.80 - 5.20 MIL/uL   Hemoglobin 13.3 12.0 - 16.0 g/dL   HCT 39.6 35.0 - 47.0 %   MCV 86.8 80.0 - 100.0 fL   MCH 29.1 26.0 - 34.0 pg   MCHC 33.5 32.0 - 36.0 g/dL   RDW 13.9 11.5 - 14.5 %   Platelets 341 150 - 440 K/uL  Troponin I  Result Value Ref Range   Troponin I <0.03 <0.03 ng/mL      Assessment & Plan:   Problem List Items Addressed This Visit      Unprioritized   Obesity (BMI 35.0-39.9 without comorbidity)    Discussed training and diet       Other Visit Diagnoses    Urinary frequency    -  Primary   Check urine.  Pt states UTI wihout symptoms is common for her.  Positive Leukocytes.  Send urine for culutre.  No antibiotics at this time.     Relevant Orders   UA/M w/rflx  Culture, Routine   Vaginal bleeding       Check labs as noted.  Order [place].  Pt fearful of seeing ob-gyn due to past trauma   Relevant Orders   [place]   TSH   CBC with Differential/Platelet   Estrogens, Total   Prolactin   US PELVIC COMPLETE WITH TRANSVAGINAL   Allergic conjunctivitis of both eyes       Add Optivar to Flonase and Cetirizine       Follow up plan: Return if symptoms worsen or fail to improve.

## 2017-03-22 NOTE — Assessment & Plan Note (Signed)
Discussed training and diet

## 2017-03-24 LAB — UA/M W/RFLX CULTURE, ROUTINE
BILIRUBIN UA: NEGATIVE
Glucose, UA: NEGATIVE
Ketones, UA: NEGATIVE
Nitrite, UA: NEGATIVE
PH UA: 5.5 (ref 5.0–7.5)
Protein, UA: NEGATIVE
RBC, UA: NEGATIVE
Specific Gravity, UA: 1.02 (ref 1.005–1.030)
Urobilinogen, Ur: 0.2 mg/dL (ref 0.2–1.0)

## 2017-03-24 LAB — MICROSCOPIC EXAMINATION

## 2017-03-24 LAB — URINE CULTURE, REFLEX

## 2017-03-24 NOTE — Progress Notes (Signed)
Normal labs.  Pt notified through mychart

## 2017-03-25 LAB — CBC WITH DIFFERENTIAL/PLATELET
Basophils Absolute: 0.1 10*3/uL (ref 0.0–0.2)
Basos: 1 %
EOS (ABSOLUTE): 0.2 10*3/uL (ref 0.0–0.4)
EOS: 2 %
HEMATOCRIT: 39.6 % (ref 34.0–46.6)
HEMOGLOBIN: 13.2 g/dL (ref 11.1–15.9)
IMMATURE GRANULOCYTES: 0 %
Immature Grans (Abs): 0 10*3/uL (ref 0.0–0.1)
Lymphocytes Absolute: 2.4 10*3/uL (ref 0.7–3.1)
Lymphs: 26 %
MCH: 28.9 pg (ref 26.6–33.0)
MCHC: 33.3 g/dL (ref 31.5–35.7)
MCV: 87 fL (ref 79–97)
MONOCYTES: 6 %
Monocytes Absolute: 0.6 10*3/uL (ref 0.1–0.9)
NEUTROS PCT: 65 %
Neutrophils Absolute: 5.9 10*3/uL (ref 1.4–7.0)
Platelets: 363 10*3/uL (ref 150–379)
RBC: 4.57 x10E6/uL (ref 3.77–5.28)
RDW: 13.8 % (ref 12.3–15.4)
WBC: 9.1 10*3/uL (ref 3.4–10.8)

## 2017-03-25 LAB — PROLACTIN: PROLACTIN: 20.2 ng/mL (ref 4.8–23.3)

## 2017-03-25 LAB — ESTROGENS, TOTAL: Estrogen: 196 pg/mL

## 2017-03-25 LAB — TSH: TSH: 0.674 u[IU]/mL (ref 0.450–4.500)

## 2017-03-25 LAB — FOLLICLE STIMULATING HORMONE: FSH: 32.6 m[IU]/mL

## 2017-03-26 ENCOUNTER — Encounter: Payer: Self-pay | Admitting: Unknown Physician Specialty

## 2017-03-29 ENCOUNTER — Ambulatory Visit
Admission: RE | Admit: 2017-03-29 | Discharge: 2017-03-29 | Disposition: A | Discharge status: Home / Self Care | Payer: BLUE CROSS/BLUE SHIELD | Source: Ambulatory Visit | Attending: Unknown Physician Specialty | Admitting: Unknown Physician Specialty

## 2017-03-29 DIAGNOSIS — N950 Postmenopausal bleeding: Secondary | ICD-10-CM | POA: Diagnosis not present

## 2017-03-29 DIAGNOSIS — N939 Abnormal uterine and vaginal bleeding, unspecified: Secondary | ICD-10-CM | POA: Diagnosis not present

## 2017-03-29 DIAGNOSIS — D250 Submucous leiomyoma of uterus: Secondary | ICD-10-CM | POA: Diagnosis not present

## 2017-03-29 DIAGNOSIS — D252 Subserosal leiomyoma of uterus: Secondary | ICD-10-CM | POA: Diagnosis not present

## 2017-03-31 ENCOUNTER — Encounter: Payer: Self-pay | Admitting: Unknown Physician Specialty

## 2017-03-31 ENCOUNTER — Other Ambulatory Visit: Payer: Self-pay | Admitting: Unknown Physician Specialty

## 2017-03-31 DIAGNOSIS — N950 Postmenopausal bleeding: Secondary | ICD-10-CM

## 2017-03-31 NOTE — Progress Notes (Unsigned)
See last mychart message.  Can you refer to Cathy Mitchell for post-menopausal bleeding?  Please let them know she has gyn phobia and is scared.

## 2017-03-31 NOTE — Progress Notes (Signed)
Referral directed and noted for schedulers to see.

## 2017-04-15 ENCOUNTER — Ambulatory Visit (INDEPENDENT_AMBULATORY_CARE_PROVIDER_SITE_OTHER): Payer: BLUE CROSS/BLUE SHIELD | Admitting: Cardiovascular Disease

## 2017-04-15 ENCOUNTER — Encounter: Payer: Self-pay | Admitting: Cardiovascular Disease

## 2017-04-15 VITALS — BP 118/70 | HR 71 | Ht 69.0 in | Wt 267.8 lb

## 2017-04-15 DIAGNOSIS — I493 Ventricular premature depolarization: Secondary | ICD-10-CM

## 2017-04-15 NOTE — Patient Instructions (Signed)
Medication Instructions: Continue same medications.   Labwork: None.   Procedures/Testing: None.   Follow-Up: 6 months with Dr. Arida.   Any Additional Special Instructions Will Be Listed Below (If Applicable).     If you need a refill on your cardiac medications before your next appointment, please call your pharmacy.   

## 2017-04-15 NOTE — Progress Notes (Signed)
Cardiology Office Note   Date:  04/15/2017   ID:  Cathy Mitchell, DOB [birth year], MRN [id number]  PCP:  Kathrine Haddock, NP  Cardiologist:   Kathlyn Sacramento, MD   Chief Complaint  Patient presents with  . other    F/u holter no complaints today. Meds reviewed verbally with pt.      History of Present Illness: Cathy Mitchell is a 52 y.o. female who is here today for a follow-up visit regarding symptomatic PVCs.   She underwent a treadmill stress test in September 2016 before having gastric bypass surgery.  The stress test was normal.  She has no history of diabetes or hyperlipidemia.  She used to have elevated blood pressure which has resolved and currently she is not on any medications.  She does have sleep apnea and refuses CPAP.  She has no family history of premature coronary artery disease.  She is not a smoker and does not consume excessive amount of caffeine.  She does not drink alcohol. She was evaluated recently for prolonged history of intermittent palpitations for at least one year duration which has worsened recently.  Holter monitor showed very frequent PVCs with a total of 24,000 beats in 48 hours representing 12% burden. Echocardiogram showed normal LV systolic function with mild mitral regurgitation.  She was started on metoprolol 25 mg twice daily with significant improvement in symptoms. She reports that recently she was not taking her vitamins and electrolyte supplements which might have contributed.  She is supposed to be on a magnesium supplement.  She improved her diet and lost 6 pounds.  She is exercising more.  No exertional symptoms.   Past Medical History:  Diagnosis Date  . Anxiety   . Arthritis   . Fibromyalgia   . Hypertension    no longer has hypertension  . Neuromuscular disorder ([place])   . Sleep apnea    does not use C-PAP    Past Surgical History:  Procedure Laterality Date  . DILATION AND CURETTAGE OF UTERUS    . GANGLION CYST EXCISION  Bilateral   . GANGLION CYST EXCISION Right 10/08/2015   Procedure: REMOVAL GANGLION OF WRIST;  Surgeon: Hessie Knows, MD;  Location: [facility] ORS;  Service: Orthopedics;  Laterality: Right;  . GASTRIC BYPASS    . HAND SURGERY Bilateral Resection  . TUBAL LIGATION       Current Outpatient Medications  Medication Sig Dispense Refill  . acetaminophen (TYLENOL) 325 MG tablet Take by mouth.    Marland Kitchen azelastine (OPTIVAR) 0.05 % ophthalmic solution Place 1 drop into both eyes 2 (two) times daily. 6 mL 12  . calcium citrate-vitamin D (CITRACAL+D) 315-200 MG-UNIT tablet Take 1 tablet by mouth daily.    . cetirizine (ZYRTEC) 10 MG tablet Take 1 tablet (10 mg total) by mouth daily. 30 tablet 11  . cholecalciferol (VITAMIN D) 1000 units tablet Take 1,000 Units by mouth daily.    . Ferrous Sulfate (IRON SUPPLEMENT PO) Take 1 tablet by mouth every other day.    . fluticasone (FLONASE) 50 MCG/ACT nasal spray Place 2 sprays into both nostrils 2 (two) times daily. 16 g 6  . Magnesium 100 MG CAPS Take by mouth.    . metoprolol tartrate (LOPRESSOR) 25 MG tablet Take 1 tablet (25 mg total) by mouth 2 (two) times daily. 180 tablet 1  . Multiple Vitamins-Minerals (MULTIPLE VITAMINS/WOMENS) tablet Take 1 tablet by mouth daily.    . Potassium 75 MG TABS Take by mouth.    Marland Kitchen  vitamin B-12 (CYANOCOBALAMIN) 500 MCG tablet Take 500 mcg by mouth daily.    . vitamin C (ASCORBIC ACID) 500 MG tablet Take 500 mg by mouth daily.    . Zinc 50 MG TABS Take 1 tablet by mouth daily.     No current facility-administered medications for this visit.     Allergies:   Oxycodone-acetaminophen; Nsaids; Other; and Percocet [oxycodone-acetaminophen]    Social History:  The patient  reports that she quit smoking about 2 years ago. Her smoking use included cigarettes. She has a 14.50 pack-year smoking history. she has never used smokeless tobacco. She reports that she does not drink alcohol or use drugs.   Family History:  The patient's  family history includes COPD in her father; Diabetes in her paternal grandfather; Eczema in her son; Heart disease in her maternal grandfather and paternal grandfather; Thyroid disease in her daughter.    ROS:  Please see the history of present illness.   Otherwise, review of systems are positive for none.   All other systems are reviewed and negative.    PHYSICAL EXAM: VS:  BP 118/70 (BP Location: Left Arm, Patient Position: Sitting, Cuff Size: Large)   Pulse 71   Ht 5\' 9"  (1.753 m)   Wt 267 lb 12 oz (121.5 kg)   BMI 39.54 kg/m  , BMI Body mass index is 39.54 kg/m. GEN: Well nourished, well developed, in no acute distress  HEENT: normal  Neck: no JVD, carotid bruits, or masses Cardiac: RRR with premature beats; no murmurs, rubs, or gallops,no edema  Respiratory:  clear to auscultation bilaterally, normal work of breathing GI: soft, nontender, nondistended, + BS MS: no deformity or atrophy  Skin: warm and dry, no rash Neuro:  Strength and sensation are intact Psych: euthymic mood, full affect   EKG:  EKG is ordered today. The ekg ordered today demonstrates normal sinus rhythm with 1 PVC.  Isolated Q wave in lead III likely a normal variant.   Recent Labs: 07/31/2016: ALT 17 08/04/2016: Magnesium 2.2 02/02/2017: BUN 15; Creatinine, Ser 0.62; Potassium 3.8; Sodium 136 03/22/2017: Hemoglobin 13.2; Platelets 363; TSH 0.674    Lipid Panel    Component Value Date/Time   CHOL 145 07/31/2016 1110   TRIG 121 07/31/2016 1110   HDL 48 07/31/2016 1110   CHOLHDL 4.3 12/03/2014 1619   VLDL 27 12/03/2014 1619   LDLCALC 73 07/31/2016 1110      Wt Readings from Last 3 Encounters:  04/15/17 267 lb 12 oz (121.5 kg)  03/22/17 270 lb (122.5 kg)  03/02/17 270 lb 8 oz (122.7 kg)       PAD Screen 03/02/2017  Previous PAD dx? No  Previous surgical procedure? No  Pain with walking? No  Feet/toe relief with dangling? No  Painful, non-healing ulcers? No  Extremities discolored? No       ASSESSMENT AND PLAN:  1.  Symptomatic PVCs: Her symptoms improved significantly with metoprolol 25 mg twice daily.  Echocardiogram showed no evidence of structural heart abnormalities.   She resumed taking her supplements which might also help given that she was not truly taking her magnesium as she was supposed to.  Continue same medications for now.   She has no symptoms suggestive of ischemic heart disease and given significant improvement of metoprolol with treatment, we will hold off on stress testing at the present time.  She did have a previous GXT in 2016 which was normal.  Disposition:   FU with me in 6 months  Signed,  Kathlyn Sacramento, MD  04/15/2017 11:50 AM    [place]

## 2017-04-27 ENCOUNTER — Encounter: Payer: Self-pay | Admitting: Unknown Physician Specialty

## 2017-04-30 ENCOUNTER — Ambulatory Visit: Payer: BLUE CROSS/BLUE SHIELD | Admitting: Unknown Physician Specialty

## 2017-04-30 ENCOUNTER — Encounter: Payer: Self-pay | Admitting: Unknown Physician Specialty

## 2017-04-30 VITALS — BP 127/78 | HR 74 | Temp 97.7°F | Wt 270.0 lb

## 2017-04-30 DIAGNOSIS — N950 Postmenopausal bleeding: Secondary | ICD-10-CM

## 2017-04-30 DIAGNOSIS — H9202 Otalgia, left ear: Secondary | ICD-10-CM

## 2017-04-30 DIAGNOSIS — H538 Other visual disturbances: Secondary | ICD-10-CM | POA: Diagnosis not present

## 2017-04-30 MED ORDER — AMOXICILLIN 875 MG PO TABS: 875.0000 mg | ORAL_TABLET | Freq: Two times a day (BID) | ORAL | 0 refills | Status: DC

## 2017-04-30 NOTE — Progress Notes (Signed)
BP 127/78   Pulse 74   Temp 97.7 F (36.5 C) (Oral)   Wt 270 lb (122.5 kg)   SpO2 99%   BMI 39.87 kg/m    Subjective:    Patient ID: Cathy Mitchell, female    DOB: [birth year], 52 y.o.   MRN: [id number]  HPI: Cathy Mitchell is a 52 y.o. female  Chief Complaint  Patient presents with  . Ear Pain    pt states she has been having left ear pain for the past 2 days    Otalgia   There is pain in the left ear. This is a new problem. Episode onset: 5 days. The problem occurs constantly. The problem has been unchanged. There has been no fever. Pertinent negatives include no abdominal pain, coughing, diarrhea, ear discharge, headaches, hearing loss, neck pain, rash, rhinorrhea, sore throat or vomiting. Associated symptoms comments: Blurred vision.   Vaginal bleeding Pt with persistent vaginal bleeding  Relevant past medical, surgical, family and social history reviewed and updated as indicated. Interim medical history since our last visit reviewed. Allergies and medications reviewed and updated.  Review of Systems  HENT: Positive for ear pain. Negative for ear discharge, hearing loss, rhinorrhea and sore throat.   Eyes:       Intermittent blurred vision  Respiratory: Negative for cough.   Gastrointestinal: Negative for abdominal pain, diarrhea and vomiting.  Musculoskeletal: Negative for neck pain.  Skin: Negative for rash.  Neurological: Negative for headaches.    Per HPI unless specifically indicated above     Objective:    BP 127/78   Pulse 74   Temp 97.7 F (36.5 C) (Oral)   Wt 270 lb (122.5 kg)   SpO2 99%   BMI 39.87 kg/m   Wt Readings from Last 3 Encounters:  04/30/17 270 lb (122.5 kg)  04/15/17 267 lb 12 oz (121.5 kg)  03/22/17 270 lb (122.5 kg)    Physical Exam  Constitutional: She is oriented to person, place, and time. She appears well-developed and well-nourished. No distress.  HENT:  Head: Normocephalic and atraumatic.  Right Ear: Tympanic membrane,  external ear and ear canal normal.  Left Ear: Tympanic membrane, external ear and ear canal normal.  Eyes: Conjunctivae and lids are normal. Right eye exhibits no discharge. Left eye exhibits no discharge. No scleral icterus.  Neck: Normal range of motion. Neck supple. No JVD present. Carotid bruit is not present.  Cardiovascular: Normal rate, regular rhythm and normal heart sounds.  Pulmonary/Chest: Effort normal and breath sounds normal.  Abdominal: Normal appearance. There is no splenomegaly or hepatomegaly.  Musculoskeletal: Normal range of motion.  Neurological: She is alert and oriented to person, place, and time.  Skin: Skin is warm, dry and intact. No rash noted. No pallor.  Psychiatric: She has a normal mood and affect. Her behavior is normal. Judgment and thought content normal.    Results for orders placed or performed in visit on 03/22/17  Microscopic Examination  Result Value Ref Range   WBC, UA 0-5 0 - 5 /hpf   RBC, UA 0-2 0 - 2 /hpf   Epithelial Cells (non renal) 0-10 0 - 10 /hpf   Mucus, UA Present Not Estab.   Bacteria, UA Few None seen/Few  Urine Culture, Reflex  Result Value Ref Range   Urine Culture, Routine Final report    Organism ID, Bacteria Comment   UA/M w/rflx Culture, Routine  Result Value Ref Range   Specific Gravity, UA 1.020 1.005 -  1.030   pH, UA 5.5 5.0 - 7.5   Color, UA Yellow Yellow   Appearance Ur Cloudy (A) Clear   Leukocytes, UA 1+ (A) Negative   Protein, UA Negative Negative/Trace   Glucose, UA Negative Negative   Ketones, UA Negative Negative   RBC, UA Negative Negative   Bilirubin, UA Negative Negative   Urobilinogen, Ur 0.2 0.2 - 1.0 mg/dL   Nitrite, UA Negative Negative   Microscopic Examination See below:    Urinalysis Reflex Comment   FSH  Result Value Ref Range   FSH 32.6 mIU/mL  TSH  Result Value Ref Range   TSH 0.674 0.450 - 4.500 uIU/mL  CBC with Differential/Platelet  Result Value Ref Range   WBC 9.1 3.4 - 10.8  x10E3/uL   RBC 4.57 3.77 - 5.28 x10E6/uL   Hemoglobin 13.2 11.1 - 15.9 g/dL   Hematocrit 39.6 34.0 - 46.6 %   MCV 87 79 - 97 fL   MCH 28.9 26.6 - 33.0 pg   MCHC 33.3 31.5 - 35.7 g/dL   RDW 13.8 12.3 - 15.4 %   Platelets 363 150 - 379 x10E3/uL   Neutrophils 65 Not Estab. %   Lymphs 26 Not Estab. %   Monocytes 6 Not Estab. %   Eos 2 Not Estab. %   Basos 1 Not Estab. %   Neutrophils Absolute 5.9 1.4 - 7.0 x10E3/uL   Lymphocytes Absolute 2.4 0.7 - 3.1 x10E3/uL   Monocytes Absolute 0.6 0.1 - 0.9 x10E3/uL   EOS (ABSOLUTE) 0.2 0.0 - 0.4 x10E3/uL   Basophils Absolute 0.1 0.0 - 0.2 x10E3/uL   Immature Granulocytes 0 Not Estab. %   Immature Grans (Abs) 0.0 0.0 - 0.1 x10E3/uL  Estrogens, Total  Result Value Ref Range   Estrogen 196 pg/mL  Prolactin  Result Value Ref Range   Prolactin 20.2 4.8 - 23.3 ng/mL      Assessment & Plan:   Problem List Items Addressed This Visit      Unprioritized   Postmenopausal vaginal bleeding    Other Visit Diagnoses    Left ear pain    -  Primary   No abnormalities noted.  Refer to ENT for further evaluation.  Continue using Flonase.  Amoxil to r/o sinusitis   Relevant Orders   Ambulatory referral to ENT   Blurred vision       Comes and goes.  Make appt with [facility]       Follow up plan: Return if symptoms worsen or fail to improve.

## 2017-05-05 ENCOUNTER — Encounter: Payer: Self-pay | Admitting: Certified Nurse Midwife

## 2017-05-05 ENCOUNTER — Ambulatory Visit: Payer: BLUE CROSS/BLUE SHIELD | Admitting: Certified Nurse Midwife

## 2017-05-05 VITALS — Ht 69.0 in | Wt 269.3 lb

## 2017-05-05 DIAGNOSIS — N950 Postmenopausal bleeding: Secondary | ICD-10-CM

## 2017-05-05 DIAGNOSIS — R87619 Unspecified abnormal cytological findings in specimens from cervix uteri: Secondary | ICD-10-CM | POA: Diagnosis not present

## 2017-05-05 DIAGNOSIS — N841 Polyp of cervix uteri: Secondary | ICD-10-CM | POA: Diagnosis not present

## 2017-05-05 NOTE — Progress Notes (Signed)
Pt is here with c/o fibroids. States she is menopausal but is has had a period. Bleeding with exercise.

## 2017-05-05 NOTE — Progress Notes (Signed)
GYN ENCOUNTER NOTE  Subjective:       Cathy Mitchell is a 52 y.o. G110P2012 female is here for gynecologic evaluation of the following issues:  1. Postmenopausal bleeding x 3 months. Pt states that she stopped her period 30 years ago and recently with weight lifting she has started to experience vaginal bleeding. She state she has a phobia of GYN and has not had a pap smear or cervical exam in many years. She admits to history of fibroids, polyps, and traumatic birth. She denies painful intercourse, increased vaginal discharge , odor, or itching. She is also interested in surgical removal of her fibroids.    Gynecologic History No LMP recorded. Patient is perimenopausal. Contraception: post menopausal status Last Pap: many years ago . Results were: normal per pt.  Last mammogram: 01/2017. Results were: normal  Obstetric History OB History  Gravida Para Term Preterm AB Living  3 2 2   1 2   SAB TAB Ectopic Multiple Live Births  1       2    # Outcome Date GA Lbr Len/2nd Weight Sex Delivery Anes PTL Lv  3 Term 2000    M Vag-Spont   LIV  2 Term 67    F Vag-Spont   LIV  1 SAB               Past Medical History:  Diagnosis Date  . Anxiety   . Arthritis   . Fibromyalgia   . Hypertension    no longer has hypertension  . Neuromuscular disorder ([place])   . Sleep apnea    does not use C-PAP    Past Surgical History:  Procedure Laterality Date  . DILATION AND CURETTAGE OF UTERUS    . GANGLION CYST EXCISION Bilateral   . GANGLION CYST EXCISION Right 10/08/2015   Procedure: REMOVAL GANGLION OF WRIST;  Surgeon: Hessie Knows, MD;  Location: [facility] ORS;  Service: Orthopedics;  Laterality: Right;  . GASTRIC BYPASS    . HAND SURGERY Bilateral Resection  . TUBAL LIGATION      Current Outpatient Medications on File Prior to Visit  Medication Sig Dispense Refill  . acetaminophen (TYLENOL) 325 MG tablet Take by mouth.    Marland Kitchen amoxicillin (AMOXIL) 875 MG tablet Take 1 tablet (875 mg total) by  mouth 2 (two) times daily. 20 tablet 0  . azelastine (OPTIVAR) 0.05 % ophthalmic solution Place 1 drop into both eyes 2 (two) times daily. 6 mL 12  . calcium citrate-vitamin D (CITRACAL+D) 315-200 MG-UNIT tablet Take 1 tablet by mouth daily.    . cetirizine (ZYRTEC) 10 MG tablet Take 1 tablet (10 mg total) by mouth daily. 30 tablet 11  . cholecalciferol (VITAMIN D) 1000 units tablet Take 1,000 Units by mouth daily.    . Ferrous Sulfate (IRON SUPPLEMENT PO) Take 1 tablet by mouth every other day.    . fluticasone (FLONASE) 50 MCG/ACT nasal spray Place 2 sprays into both nostrils 2 (two) times daily. 16 g 6  . Magnesium 100 MG CAPS Take by mouth.    . metoprolol tartrate (LOPRESSOR) 25 MG tablet Take 1 tablet (25 mg total) by mouth 2 (two) times daily. 180 tablet 1  . Multiple Vitamins-Minerals (MULTIPLE VITAMINS/WOMENS) tablet Take 1 tablet by mouth daily.    . Potassium 75 MG TABS Take by mouth.    . vitamin B-12 (CYANOCOBALAMIN) 500 MCG tablet Take 500 mcg by mouth daily.    . vitamin C (ASCORBIC ACID) 500 MG tablet  Take 500 mg by mouth daily.    . Zinc 50 MG TABS Take 1 tablet by mouth daily.     No current facility-administered medications on file prior to visit.     Allergies  Allergen Reactions  . Oxycodone-Acetaminophen Itching and Other (See Comments)    Reaction:  Dizziness and blurred vision   . Nsaids     Kidney disease  . Other Other (See Comments)    Patient states she is allergic to all narcotics  . Percocet [Oxycodone-Acetaminophen] Nausea And Vomiting and Other (See Comments)    Reaction:  Dizziness and blurred vision  Reaction:  Dizziness and blurred vision     Social History   Socioeconomic History  . Marital status: Married    Spouse name: Not on file  . Number of children: Not on file  . Years of education: Not on file  . Highest education level: Not on file  Social Needs  . Financial resource strain: Not on file  . Food insecurity - worry: Not on file  .  Food insecurity - inability: Not on file  . Transportation needs - medical: Not on file  . Transportation needs - non-medical: Not on file  Occupational History  . Not on file  Tobacco Use  . Smoking status: Former Smoker    Packs/day: 0.50    Years: 29.00    Pack years: 14.50    Types: Cigarettes    Last attempt to quit: 01/29/2015    Years since quitting: 2.2  . Smokeless tobacco: Never Used  Substance and Sexual Activity  . Alcohol use: No    Alcohol/week: 0.0 oz  . Drug use: No  . Sexual activity: Yes  Other Topics Concern  . Not on file  Social History Narrative  . Not on file    Family History  Problem Relation Age of Onset  . COPD Father   . Thyroid disease Daughter   . Eczema Son   . Heart disease Maternal Grandfather   . Diabetes Paternal Grandfather   . Heart disease Paternal Grandfather     The following portions of the patient's history were reviewed and updated as appropriate: allergies, current medications, past family history, past medical history, past social history, past surgical history and problem list.  Review of Systems Review of Systems - Negative except as mentioned in HPI Review of Systems - General ROS: negative for - chills, fatigue, fever, hot flashes, malaise or night sweats Hematological and Lymphatic ROS: negative for - bleeding problems or swollen lymph nodes Gastrointestinal ROS: negative for - abdominal pain, blood in stools, change in bowel habits and nausea/vomiting Musculoskeletal ROS: negative for - joint pain, muscle pain or muscular weakness Genito-Urinary ROS: negative for - change in menstrual cycle, dysmenorrhea, dyspareunia, dysuria, genital discharge, genital ulcers, hematuria, incontinence, irregular/heavy menses, nocturia or pelvic pain. Positive for postmenopausal bleeding.   Objective:   Ht 5\' 9"  (1.753 m)   Wt 269 lb 5 oz (122.2 kg)   BMI 39.77 kg/m  CONSTITUTIONAL: Well-developed, well-nourished female in no acute  distress.  HENT:  Normocephalic, atraumatic.  NECK: Normal range of motion, supple, no masses.  Normal thyroid.  SKIN: Skin is warm and dry. No rash noted. Not diaphoretic. No erythema. No pallor. [place]: Alert and oriented to person, place, and time. PSYCHIATRIC: Normal mood and affect. Normal behavior. Normal judgment and thought content. CARDIOVASCULAR:Not Examined RESPIRATORY: Not Examined BREASTS: Not Examined ABDOMEN: Soft, non distended; Non tender.  No Organomegaly. PELVIC:  External  Genitalia: Normal  BUS: Normal  Vagina: Normal, small amount of blood noted   Cervix: Normal, polyp noted approximately 9 o'clock -removed  Uterus: difficult to assess due to maternal body habitus   MUSCULOSKELETAL: Normal range of motion. No tenderness.  No cyanosis, clubbing, or edema.   Previous ultrasound results TRANSABDOMINAL AND TRANSVAGINAL ULTRASOUND OF PELVIS (completed 03/29/17)  TECHNIQUE: Both transabdominal and transvaginal ultrasound examinations of the pelvis were performed. Transabdominal technique was performed for global imaging of the pelvis including uterus, ovaries, adnexal regions, and pelvic cul-de-sac. It was necessary to proceed with endovaginal exam following the transabdominal exam to visualize the endometrium.  COMPARISON:  None  FINDINGS: Uterus Measurements: 7.6 x 6.2 x 5.4 cm. The uterus is retroverted. There are least 2 hypoechoic leiomyomas associated with the uterus, one subserosal posteriorly measuring 3.4 x 2.4 x 3.1 cm within the uterine body and an adjacent anatomically anterior submucosal 2.2 x 2.2 x 1.7 cm leiomyoma on the right (image 41/95).  Endometrium  Thickness: 4.5 mm.  No focal abnormality visualized.  Right ovary  Measurements: 2.4 x 1.6 x 2.4 cm. Normal appearance/no adnexal mass.  Left ovary  Measurements: 2.4 x 1.8 x 2.1 cm. Small anechoic 1.7 cm simple cyst is noted.  Other findings  No abnormal free  fluid.  IMPRESSION: Fibroid uterus with one submucosal appearing right-sided fibroid measuring 2.2 x 2.2 x 1.7 cm that may be contributing to the patient's postmenopausal bleeding. Otherwise unremarkable.   Electronically Signed   By: Ashley Royalty M.D.   On: 03/29/2017 16:05    Assessment:   Fibroids Postmenopausal bleeding  Cervical polyp   Plan:  Pap smear,  endometrial biopsy , small cervical polypectomy today. Will follow up with results. Pt referred to Dr. Enzo Bi to discuss surgical removal of fibroids. She verbalizes understanding and agrees to plan of care.   Philip Aspen, CNM

## 2017-05-05 NOTE — Patient Instructions (Signed)
Preventive Care 40-64 Years, Female Preventive care refers to lifestyle choices and visits with your health care provider that can promote health and wellness. What does preventive care include?  A yearly physical exam. This is also called an annual well check.  Dental exams once or twice a year.  Routine eye exams. Ask your health care provider how often you should have your eyes checked.  Personal lifestyle choices, including: ? Daily care of your teeth and gums. ? Regular physical activity. ? Eating a healthy diet. ? Avoiding tobacco and drug use. ? Limiting alcohol use. ? Practicing safe sex. ? Taking low-dose aspirin daily starting at age 58. ? Taking vitamin and mineral supplements as recommended by your health care provider. What happens during an annual well check? The services and screenings done by your health care provider during your annual well check will depend on your age, overall health, lifestyle risk factors, and family history of disease. Counseling Your health care provider may ask you questions about your:  Alcohol use.  Tobacco use.  Drug use.  Emotional well-being.  Home and relationship well-being.  Sexual activity.  Eating habits.  Work and work Statistician.  Method of birth control.  Menstrual cycle.  Pregnancy history.  Screening You may have the following tests or measurements:  Height, weight, and BMI.  Blood pressure.  Lipid and cholesterol levels. These may be checked every 5 years, or more frequently if you are over 81 years old.  Skin check.  Lung cancer screening. You may have this screening every year starting at age 78 if you have a 30-pack-year history of smoking and currently smoke or have quit within the past 15 years.  Fecal occult blood test (FOBT) of the stool. You may have this test every year starting at age 65.  Flexible sigmoidoscopy or colonoscopy. You may have a sigmoidoscopy every 5 years or a colonoscopy  every 10 years starting at age 30.  Hepatitis C blood test.  Hepatitis B blood test.  Sexually transmitted disease (STD) testing.  Diabetes screening. This is done by checking your blood sugar (glucose) after you have not eaten for a while (fasting). You may have this done every 1-3 years.  Mammogram. This may be done every 1-2 years. Talk to your health care provider about when you should start having regular mammograms. This may depend on whether you have a family history of breast cancer.  BRCA-related cancer screening. This may be done if you have a family history of breast, ovarian, tubal, or peritoneal cancers.  Pelvic exam and Pap test. This may be done every 3 years starting at age 80. Starting at age 36, this may be done every 5 years if you have a Pap test in combination with an HPV test.  Bone density scan. This is done to screen for osteoporosis. You may have this scan if you are at high risk for osteoporosis.  Discuss your test results, treatment options, and if necessary, the need for more tests with your health care provider. Vaccines Your health care provider may recommend certain vaccines, such as:  Influenza vaccine. This is recommended every year.  Tetanus, diphtheria, and acellular pertussis (Tdap, Td) vaccine. You may need a Td booster every 10 years.  Varicella vaccine. You may need this if you have not been vaccinated.  Zoster vaccine. You may need this after age 5.  Measles, mumps, and rubella (MMR) vaccine. You may need at least one dose of MMR if you were born in  1956 or later. You may also need a second dose.  Pneumococcal 13-valent conjugate (PCV13) vaccine. You may need this if you have certain conditions and were not previously vaccinated.  Pneumococcal polysaccharide (PPSV23) vaccine. You may need one or two doses if you smoke cigarettes or if you have certain conditions.  Meningococcal vaccine. You may need this if you have certain  conditions.  Hepatitis A vaccine. You may need this if you have certain conditions or if you travel or work in places where you may be exposed to hepatitis A.  Hepatitis B vaccine. You may need this if you have certain conditions or if you travel or work in places where you may be exposed to hepatitis B.  Haemophilus influenzae type b (Hib) vaccine. You may need this if you have certain conditions.  Talk to your health care provider about which screenings and vaccines you need and how often you need them. This information is not intended to replace advice given to you by your health care provider. Make sure you discuss any questions you have with your health care provider. Document Released: 04/12/2015 Document Revised: 12/04/2015 Document Reviewed: 01/15/2015 Elsevier Interactive Patient Education  2018 [employer]  

## 2017-05-07 ENCOUNTER — Encounter: Payer: Self-pay | Admitting: Certified Nurse Midwife

## 2017-05-07 LAB — PATHOLOGY

## 2017-05-12 ENCOUNTER — Encounter: Payer: Self-pay | Admitting: Certified Nurse Midwife

## 2017-05-12 LAB — PAP IG AND HPV HIGH-RISK
HPV, HIGH-RISK: NEGATIVE
PAP Smear Comment: 0

## 2017-05-13 ENCOUNTER — Encounter: Payer: Self-pay | Admitting: Obstetrics and Gynecology

## 2017-05-13 ENCOUNTER — Ambulatory Visit: Payer: BLUE CROSS/BLUE SHIELD | Admitting: Obstetrics and Gynecology

## 2017-05-13 VITALS — BP 119/64 | Ht 69.0 in | Wt 266.7 lb

## 2017-05-13 DIAGNOSIS — D250 Submucous leiomyoma of uterus: Secondary | ICD-10-CM | POA: Diagnosis not present

## 2017-05-13 DIAGNOSIS — R87619 Unspecified abnormal cytological findings in specimens from cervix uteri: Secondary | ICD-10-CM

## 2017-05-13 DIAGNOSIS — N840 Polyp of corpus uteri: Secondary | ICD-10-CM | POA: Diagnosis not present

## 2017-05-13 DIAGNOSIS — D252 Subserosal leiomyoma of uterus: Secondary | ICD-10-CM | POA: Diagnosis not present

## 2017-05-13 DIAGNOSIS — N950 Postmenopausal bleeding: Secondary | ICD-10-CM

## 2017-05-13 NOTE — Patient Instructions (Signed)
1.  Hysteroscopy/D&C and LEEP cone biopsy are scheduled 2.  Return 1 week prior to surgery for preop appointment  Hysteroscopy Hysteroscopy is a procedure used for looking inside the womb (uterus). It may be done for various reasons, including:  To evaluate abnormal bleeding, fibroid (benign, noncancerous) tumors, polyps, scar tissue (adhesions), and possibly cancer of the uterus.  To look for lumps (tumors) and other uterine growths.  To look for causes of why a woman cannot get pregnant (infertility), causes of recurrent loss of pregnancy (miscarriages), or a lost intrauterine device (IUD).  To perform a sterilization by blocking the fallopian tubes from inside the uterus.  In this procedure, a thin, flexible tube with a tiny light and camera on the end of it (hysteroscope) is used to look inside the uterus. A hysteroscopy should be done right after a menstrual period to be sure you are not pregnant. LET [facility] CARE PROVIDER KNOW ABOUT:  Any allergies you have.  All medicines you are taking, including vitamins, herbs, eye drops, creams, and over-the-counter medicines.  Previous problems you or members of your family have had with the use of anesthetics.  Any blood disorders you have.  Previous surgeries you have had.  Medical conditions you have. RISKS AND COMPLICATIONS Generally, this is a safe procedure. However, as with any procedure, complications can occur. Possible complications include:  Putting a hole in the uterus.  Excessive bleeding.  Infection.  Damage to the cervix.  Injury to other organs.  Allergic reaction to medicines.  Too much fluid used in the uterus for the procedure.  BEFORE THE PROCEDURE  Ask your health care provider about changing or stopping any regular medicines.  Do not take aspirin or blood thinners for 1 week before the procedure, or as directed by your health care provider. These can cause bleeding.  If you smoke, do not smoke  for 2 weeks before the procedure.  In some cases, a medicine is placed in the cervix the day before the procedure. This medicine makes the cervix have a larger opening (dilate). This makes it easier for the instrument to be inserted into the uterus during the procedure.  Do not eat or drink anything for at least 8 hours before the surgery.  Arrange for someone to take you home after the procedure. PROCEDURE  You may be given a medicine to relax you (sedative). You may also be given one of the following: ? A medicine that numbs the area around the cervix (local anesthetic). ? A medicine that makes you sleep through the procedure (general anesthetic).  The hysteroscope is inserted through the vagina into the uterus. The camera on the hysteroscope sends a picture to a TV screen. This gives the surgeon a good view inside the uterus.  During the procedure, air or a liquid is put into the uterus, which allows the surgeon to see better.  Sometimes, tissue is gently scraped from inside the uterus. These tissue samples are sent to a lab for testing. What to expect after the procedure  If you had a general anesthetic, you may be groggy for a couple hours after the procedure.  If you had a local anesthetic, you will be able to go home as soon as you are stable and feel ready.  You may have some cramping. This normally lasts for a couple days.  You may have bleeding, which varies from light spotting for a few days to menstrual-like bleeding for 3-7 days. This is normal.  If  your test results are not back during the visit, make an appointment with your health care provider to find out the results. This information is not intended to replace advice given to you by your health care provider. Make sure you discuss any questions you have with your health care provider. Document Released: 06/22/2000 Document Revised: 08/22/2015 Document Reviewed: 10/13/2012 Elsevier Interactive Patient Education  2017  [place].  Dilation and Curettage or Vacuum Curettage Dilation and curettage (D&C) and vacuum curettage are minor procedures. A D&C involves stretching (dilation) the cervix and scraping (curettage) the inside lining of the uterus (endometrium). During a D&C, tissue is gently scraped from the endometrium, starting from the top portion of the uterus down to the lowest part of the uterus (cervix). During a vacuum curettage, the lining and tissue in the uterus are removed with the use of gentle suction. Curettage may be performed to either diagnose or treat a problem. As a diagnostic procedure, curettage is performed to examine tissues from the uterus. A diagnostic curettage may be done if you have:  Irregular bleeding in the uterus.  Bleeding with the development of clots.  Spotting between menstrual periods.  Prolonged menstrual periods or other abnormal bleeding.  Bleeding after menopause.  No menstrual period (amenorrhea).  A change in size and shape of the uterus.  Abnormal endometrial cells discovered during a Pap test.  As a treatment procedure, curettage may be performed for the following reasons:  Removal of an IUD (intrauterine device).  Removal of retained placenta after giving birth.  Abortion.  Miscarriage.  Removal of endometrial polyps.  Removal of uncommon types of noncancerous lumps (fibroids).  Tell a health care provider about:  Any allergies you have, including allergies to prescribed medicine or latex.  All medicines you are taking, including vitamins, herbs, eye drops, creams, and over-the-counter medicines. This is especially important if you take any blood-thinning medicine. Bring a list of all of your medicines to your appointment.  Any problems you or family members have had with anesthetic medicines.  Any blood disorders you have.  Any surgeries you have had.  Your medical history and any medical conditions you have.  Whether you are  pregnant or may be pregnant.  Recent vaginal infections you have had.  Recent menstrual periods, bleeding problems you have had, and what form of birth control (contraception) you use. What are the risks? Generally, this is a safe procedure. However, problems may occur, including:  Infection.  Heavy vaginal bleeding.  Allergic reactions to medicines.  Damage to the cervix or other structures or organs.  Development of scar tissue (adhesions) inside the uterus, which can cause abnormal amounts of menstrual bleeding. This may make it harder to get pregnant in the future.  A hole (perforation) or puncture in the uterine wall. This is rare.  What happens before the procedure? Staying hydrated Follow instructions from your health care provider about hydration, which may include:  Up to 2 hours before the procedure - you may continue to drink clear liquids, such as water, clear fruit juice, black coffee, and plain tea.  Eating and drinking restrictions Follow instructions from your health care provider about eating and drinking, which may include:  8 hours before the procedure - stop eating heavy meals or foods such as meat, fried foods, or fatty foods.  6 hours before the procedure - stop eating light meals or foods, such as toast or cereal.  6 hours before the procedure - stop drinking milk or drinks  that contain milk.  2 hours before the procedure - stop drinking clear liquids. If your health care provider told you to take your medicine(s) on the day of your procedure, take them with only a sip of water.  Medicines  Ask your health care provider about: ? Changing or stopping your regular medicines. This is especially important if you are taking diabetes medicines or blood thinners. ? Taking medicines such as aspirin and ibuprofen. These medicines can thin your blood. Do not take these medicines before your procedure if your health care provider instructs you not to.  You may  be given antibiotic medicine to help prevent infection. General instructions  For 24 hours before your procedure, do not: ? Douche. ? Use tampons. ? Use medicines, creams, or suppositories in the vagina. ? Have sexual intercourse.  You may be given a pregnancy test on the day of the procedure.  Plan to have someone take you home from the hospital or clinic.  You may have a blood or urine sample taken.  If you will be going home right after the procedure, plan to have someone with you for 24 hours. What happens during the procedure?  To reduce your risk of infection: ? Your health care team will wash or sanitize their hands. ? Your skin will be washed with soap.  An IV tube will be inserted into one of your veins.  You will be given one of the following: ? A medicine that numbs the area in and around the cervix (local anesthetic). ? A medicine to make you fall asleep (general anesthetic).  You will lie down on your back, with your feet in foot rests (stirrups).  The size and position of your uterus will be checked.  A lubricated instrument (speculum or Sims retractor) will be inserted into the back side of your vagina. The speculum will be used to hold apart the walls of your vagina so your health care provider can see your cervix.  A tool (tenaculum) will be attached to the lip of the cervix to stabilize it.  Your cervix will be softened and dilated. This may be done by: ? Taking a medicine. ? Having tapered dilators or thin rods (laminaria) or gradual widening instruments (tapered dilators) inserted into your cervix.  A small, sharp, curved instrument (curette) will be used to scrape a small amount of tissue or cells from the endometrium or cervical canal. In some cases, gentle suction is applied with the curette. The curette will then be removed. The cells will be taken to a lab for testing. The procedure may vary among health care providers and hospitals. What happens  after the procedure?  You may have mild cramping, backache, pain, and light bleeding or spotting. You may pass small blood clots from your vagina.  You may have to wear compression stockings. These stockings help to prevent blood clots and reduce swelling in your legs.  Your blood pressure, heart rate, breathing rate, and blood oxygen level will be monitored until the medicines you were given have worn off. Summary  Dilation and curettage (D&C) involves stretching (dilation) the cervix and scraping (curettage) the inside lining of the uterus (endometrium).  After the procedure, you may have mild cramping, backache, pain, and light bleeding or spotting. You may pass small blood clots from your vagina.  Plan to have someone take you home from the hospital or clinic. This information is not intended to replace advice given to you by your health care provider. Make  sure you discuss any questions you have with your health care provider. Document Released: 03/16/2005 Document Revised: 12/01/2015 Document Reviewed: 12/01/2015 Elsevier Interactive Patient Education  2018 [place].  Loop Electrosurgical Excision Procedure Loop electrosurgical excision procedure (LEEP) is the cutting and removal (excision) of part of the cervix. The cervix is the bottom part of the uterus that opens into the vagina. The tissue that is removed from the cervix is then examined to see if there are precancerous cells or cancer cells present. LEEP may be done when:  You have abnormal bleeding from your cervix.  You have an abnormal Pap test result.  Your health care provider finds an abnormality on your cervix during a pelvic exam.  LEEP typically only takes a few minutes and is often done in the health care provider's office. The procedure is safe for women who are trying to get pregnant. However, the procedure is usually not done during a menstrual period or during pregnancy. Tell a health care provider  about:  Any allergies you have.  All medicines you are taking, including vitamins, herbs, eye drops, creams, and over-the-counter medicines.  Any problems you or family members have had with anesthetic medicines.  Any blood disorders you have.  Any surgeries you have had.  Any medical conditions you have, including current or past vaginal infections, such as herpes or sexually transmitted infections (STIs).  Whether you are pregnant or may be pregnant. What are the risks? Generally, this is a safe procedure. However, problems may occur, including:  Infection.  Bleeding.  Allergic reactions to medicines.  Changes or scarring in the cervix.  Increased risk of early (preterm) labor in future pregnancies.  What happens before the procedure?  Ask your health care provider about: ? Changing or stopping your regular medicines. This is especially important if you are taking diabetes medicines or blood thinners. ? Taking medicines such as aspirin and ibuprofen. These medicines can thin your blood. Do not take these medicines before your procedure if your health care provider instructs you not to.  Your health care provider may recommend that you take pain medicine before the procedure.  Plan to have someone take you home after the procedure. What happens during the procedure?  An instrument called a speculum will be placed in your vagina. This will allow your health care provider to see the cervix.  You will be given a medicine to numb the area (local anesthetic). The medicine will be injected into your cervix and the surrounding area.  A solution will be applied to your cervix. This solution will help the health care provider find the abnormal cells that need to be removed.  A thin wire loop will be passed through your vagina. The wire will be used to burn (cauterize) the cervical tissue with an electrical current.  The abnormal cervical tissue will be removed.  Any open  blood vessels will be cauterized to prevent bleeding.  A paste may be applied to the cauterized area of your cervix to help prevent bleeding.  The sample of cervical tissue will be examined under a microscope. The procedure may vary among health care providers and hospitals. What happens after the procedure?  You may have mild abdominal cramping.  You may have a small amount of bleeding (spotting) from the vagina.  You may have a dark-colored discharge coming from your vagina. This is from the paste that was used on the cervix to prevent bleeding.  Your health care provider may recommend pelvic rest.  Pelvic rest generally means avoiding sex and not putting anything in the vagina, such as tampons, creams, and douches.  It is your responsibility to get your test results. Ask your health care provider or the department performing the test when your results will be ready. This information is not intended to replace advice given to you by your health care provider. Make sure you discuss any questions you have with your health care provider. Document Released: 06/06/2002 Document Revised: 04/12/2015 Document Reviewed: 01/28/2015 Elsevier Interactive Patient Education  2018 [employer].

## 2017-05-13 NOTE — Progress Notes (Signed)
GYN ENCOUNTER NOTE  Subjective:       Cathy Mitchell is a 52 y.o. G64P2012 female is here for gynecologic evaluation of the following issues:   Chief complaint: 1.  History of postmenopausal bleeding 2.  AGUS Pap smear 3.  Uterine fibroids 4.  Endometrial polyp  Patient reports history of D&C approximately 30 years ago with no menses since. Patient experienced vasomotor symptoms approximately 2 years ago onset with cessation of symptoms approximately 1 year ago. December 2018 patient developed postmenopausal bleeding. Patient has phobia of GYN and has not had a Pap smear or cervical exam in years  Workup has included the following: 1.  Endocervical polyp removal-benign (05/05/2017) 2.  Endometrial biopsy-benign endometrial polyp without evidence of hyperplasia or carcinoma (05/05/2017) 3.  AGUS Pap smear; negative high risk HPV (05/05/2017) 4.  Ultrasound demonstrating a subserosal and submucosal fibroid; endometrial stripe 4.5 mm; normal ovaries (03/29/2017)   Obstetric History OB History  Gravida Para Term Preterm AB Living  3 2 2   1 2   SAB TAB Ectopic Multiple Live Births  1       2    # Outcome Date GA Lbr Len/2nd Weight Sex Delivery Anes PTL Lv  3 Term 2000    M Vag-Spont   LIV  2 Term 72    F Vag-Spont   LIV  1 SAB               Past Medical History:  Diagnosis Date  . Anxiety   . Arthritis   . Fibromyalgia   . Hypertension    no longer has hypertension  . Neuromuscular disorder ([place])   . Sleep apnea    does not use C-PAP    Past Surgical History:  Procedure Laterality Date  . DILATION AND CURETTAGE OF UTERUS    . GANGLION CYST EXCISION Bilateral   . GANGLION CYST EXCISION Right 10/08/2015   Procedure: REMOVAL GANGLION OF WRIST;  Surgeon: Hessie Knows, MD;  Location: [facility] ORS;  Service: Orthopedics;  Laterality: Right;  . GASTRIC BYPASS    . HAND SURGERY Bilateral Resection  . TUBAL LIGATION      Current Outpatient Medications on File Prior to Visit   Medication Sig Dispense Refill  . acetaminophen (TYLENOL) 325 MG tablet Take by mouth.    Marland Kitchen amoxicillin (AMOXIL) 875 MG tablet Take 1 tablet (875 mg total) by mouth 2 (two) times daily. 20 tablet 0  . azelastine (OPTIVAR) 0.05 % ophthalmic solution Place 1 drop into both eyes 2 (two) times daily. 6 mL 12  . calcium citrate-vitamin D (CITRACAL+D) 315-200 MG-UNIT tablet Take 1 tablet by mouth daily.    . cetirizine (ZYRTEC) 10 MG tablet Take 1 tablet (10 mg total) by mouth daily. 30 tablet 11  . cholecalciferol (VITAMIN D) 1000 units tablet Take 1,000 Units by mouth daily.    . Ferrous Sulfate (IRON SUPPLEMENT PO) Take 1 tablet by mouth every other day.    . fluticasone (FLONASE) 50 MCG/ACT nasal spray Place 2 sprays into both nostrils 2 (two) times daily. 16 g 6  . Magnesium 100 MG CAPS Take by mouth.    . metoprolol tartrate (LOPRESSOR) 25 MG tablet Take 1 tablet (25 mg total) by mouth 2 (two) times daily. 180 tablet 1  . Multiple Vitamins-Minerals (MULTIPLE VITAMINS/WOMENS) tablet Take 1 tablet by mouth daily.    . Potassium 75 MG TABS Take by mouth.    . vitamin B-12 (CYANOCOBALAMIN) 500 MCG tablet Take  500 mcg by mouth daily.    . vitamin C (ASCORBIC ACID) 500 MG tablet Take 500 mg by mouth daily.    . Zinc 50 MG TABS Take 1 tablet by mouth daily.     No current facility-administered medications on file prior to visit.     Allergies  Allergen Reactions  . Oxycodone-Acetaminophen Itching and Other (See Comments)    Reaction:  Dizziness and blurred vision   . Nsaids     Kidney disease  . Other Other (See Comments)    Patient states she is allergic to all narcotics  . Percocet [Oxycodone-Acetaminophen] Nausea And Vomiting and Other (See Comments)    Reaction:  Dizziness and blurred vision  Reaction:  Dizziness and blurred vision     Social History   Socioeconomic History  . Marital status: Married    Spouse name: Not on file  . Number of children: Not on file  . Years of  education: Not on file  . Highest education level: Not on file  Social Needs  . Financial resource strain: Not on file  . Food insecurity - worry: Not on file  . Food insecurity - inability: Not on file  . Transportation needs - medical: Not on file  . Transportation needs - non-medical: Not on file  Occupational History  . Not on file  Tobacco Use  . Smoking status: Former Smoker    Packs/day: 0.50    Years: 29.00    Pack years: 14.50    Types: Cigarettes    Last attempt to quit: 01/29/2015    Years since quitting: 2.2  . Smokeless tobacco: Never Used  Substance and Sexual Activity  . Alcohol use: No    Alcohol/week: 0.0 oz  . Drug use: No  . Sexual activity: Yes  Other Topics Concern  . Not on file  Social History Narrative  . Not on file    Family History  Problem Relation Age of Onset  . COPD Father   . Thyroid disease Daughter   . Eczema Son   . Heart disease Maternal Grandfather   . Diabetes Paternal Grandfather   . Heart disease Paternal Grandfather     The following portions of the patient's history were reviewed and updated as appropriate: allergies, current medications, past family history, past medical history, past social history, past surgical history and problem list.  Review of Systems Comprehensive review of systems is negative except for that noted in the HPI Objective:   BP 119/64   Ht 5\' 9"  (1.753 m)   Wt 266 lb 11.2 oz (121 kg)   BMI 39.38 kg/m  Physical exam-deferred    Assessment:   1. Postmenopausal vaginal bleeding  2. Atypical glandular cells of undetermined significance (AGUS) on cervical Pap smear  3. Endometrial polyp on endometrial biopsy  4. Submucous and subserous leiomyoma of uterus on ultrasound     Plan:   1.  LEEP cone biopsy 2.  Hysteroscopy/D&C 3.  Indications for surgery and explanation of procedures was completed today. 4.  Return for preop appointment the week before surgery  A total of 25 minutes were  spent face-to-face with the patient during this encounter and over half of that time involved counseling and coordination of care.  Brayton Mars, MD  Note: This dictation was prepared with Dragon dictation along with smaller phrase technology. Any transcriptional errors that result from this process are unintentional.

## 2017-05-18 ENCOUNTER — Encounter: Payer: Self-pay | Admitting: Obstetrics and Gynecology

## 2017-05-18 ENCOUNTER — Ambulatory Visit (INDEPENDENT_AMBULATORY_CARE_PROVIDER_SITE_OTHER): Payer: BLUE CROSS/BLUE SHIELD | Admitting: Obstetrics and Gynecology

## 2017-05-18 VITALS — BP 136/89 | HR 58 | Ht 69.0 in | Wt 267.4 lb

## 2017-05-18 DIAGNOSIS — D250 Submucous leiomyoma of uterus: Secondary | ICD-10-CM

## 2017-05-18 DIAGNOSIS — D252 Subserosal leiomyoma of uterus: Secondary | ICD-10-CM

## 2017-05-18 DIAGNOSIS — Z01818 Encounter for other preprocedural examination: Secondary | ICD-10-CM

## 2017-05-18 DIAGNOSIS — R87619 Unspecified abnormal cytological findings in specimens from cervix uteri: Secondary | ICD-10-CM

## 2017-05-18 DIAGNOSIS — N840 Polyp of corpus uteri: Secondary | ICD-10-CM

## 2017-05-18 DIAGNOSIS — N950 Postmenopausal bleeding: Secondary | ICD-10-CM

## 2017-05-18 NOTE — Patient Instructions (Signed)
1.  Return for postop check 1 week after surgery 

## 2017-05-18 NOTE — Progress Notes (Signed)
PREOP HISTORY AND PHYSICAL  Date of surgery: 05/24/2017 Diagnosis: 1.  Postmenopausal bleeding 2.  Endometrial polyps on biopsy 3.  AGUS Pap smear   Patient is a 52 y.o. G3P208female scheduled for surgery on 05/24/2017. Postmenopausal bleeding developed in December 2018. Workup for postmenopausal bleeding has included a benign endocervical polyp removal (05/05/2017; endometrial biopsy revealed benign endometrial polyp without evidence of hyperplasia or carcinoma (05/05/2017); AGUS Pap smear; negative high risk HPV Pap smear (05/05/2017); ultrasound demonstrates a subserosal and submucosal fibroid with an endometrial stripe measuring 4.5 mm and normal ovaries (03/29/2017.  OB History    Gravida Para Term Preterm AB Living   3 2 2   1 2    SAB TAB Ectopic Multiple Live Births   1       2      No LMP recorded. Patient is perimenopausal.    Past Medical History:  Diagnosis Date  . Anxiety   . Arthritis   . Fibromyalgia   . Hypertension    no longer has hypertension  . Neuromuscular disorder ([place])   . Sleep apnea    does not use C-PAP    Past Surgical History:  Procedure Laterality Date  . DILATION AND CURETTAGE OF UTERUS    . GANGLION CYST EXCISION Bilateral   . GANGLION CYST EXCISION Right 10/08/2015   Procedure: REMOVAL GANGLION OF WRIST;  Surgeon: Hessie Knows, MD;  Location: [facility] ORS;  Service: Orthopedics;  Laterality: Right;  . GASTRIC BYPASS    . HAND SURGERY Bilateral Resection  . TUBAL LIGATION      OB History  Gravida Para Term Preterm AB Living  3 2 2   1 2   SAB TAB Ectopic Multiple Live Births  1       2    # Outcome Date GA Lbr Len/2nd Weight Sex Delivery Anes PTL Lv  3 Term 2000    M Vag-Spont   LIV  2 Term 1994    F Vag-Spont   LIV  1 SAB               Social History   Socioeconomic History  . Marital status: Married    Spouse name: None  . Number of children: None  . Years of education: None  . Highest education level: None  Social Needs  .  Financial resource strain: None  . Food insecurity - worry: None  . Food insecurity - inability: None  . Transportation needs - medical: None  . Transportation needs - non-medical: None  Occupational History  . None  Tobacco Use  . Smoking status: Former Smoker    Packs/day: 0.50    Years: 29.00    Pack years: 14.50    Types: Cigarettes    Last attempt to quit: 01/29/2015    Years since quitting: 2.3  . Smokeless tobacco: Never Used  Substance and Sexual Activity  . Alcohol use: No    Alcohol/week: 0.0 oz  . Drug use: No  . Sexual activity: Yes  Other Topics Concern  . None  Social History Narrative  . None    Family History  Problem Relation Age of Onset  . COPD Father   . Thyroid disease Daughter   . Eczema Son   . Heart disease Maternal Grandfather   . Diabetes Paternal Grandfather   . Heart disease Paternal Grandfather      (Not in a hospital admission)  Allergies  Allergen Reactions  . Oxycodone-Acetaminophen Itching and Other (See Comments)  Reaction:  Dizziness and blurred vision   . Nsaids     Kidney disease  . Other Other (See Comments)    Patient states she is allergic to all narcotics  . Percocet [Oxycodone-Acetaminophen] Nausea And Vomiting and Other (See Comments)    Reaction:  Dizziness and blurred vision  Reaction:  Dizziness and blurred vision     Review of Systems Constitutional: No recent fever/chills/sweats Respiratory: No recent cough/bronchitis Cardiovascular: No chest pain Gastrointestinal: No recent nausea/vomiting/diarrhea Genitourinary: No UTI symptoms Hematologic/lymphatic:No history of coagulopathy or recent blood thinner use    Objective:    BP 136/89   Pulse (!) 58   Ht 5\' 9"  (1.753 m)   Wt 267 lb 6.4 oz (121.3 kg)   BMI 39.49 kg/m   General:   Normal  Skin:   normal  HEENT:  Normal  Neck:  Supple without Adenopathy or Thyromegaly  Lungs:   Heart:              Breasts:   Abdomen:  Pelvis:  M/S   Extremeties:   Neuro:    clear to auscultation bilaterally   Normal without murmur   Not Examined   soft, non-tender; bowel sounds normal; no masses,  no organomegaly   Exam deferred to OR  No CVAT  Warm/Dry   Normal         Assessment:    1.  Postmenopausal bleeding 2.  Endometrial polyp on endometrial biopsy 3.  AGUS Pap smear, unexplained   Plan:  1.  Hysteroscopy/D&C 2.  LEEP cone biopsy of the cervix  Preop counseling: Patient is to undergo hysteroscopy/D&C and LEEP cone biopsy of the cervix on 05/24/2017.  She is understanding of the planned procedures and is aware of and is accepting of all surgical risks which include but are not limited to bleeding, infection, pelvic organ injury with need for repair, blood clot disorders, anesthesia risk, etc.  All questions have been answered.  Informed consent is given.  Patient is ready willing to proceed with surgery as scheduled.  Brayton Mars, MD  Note: This dictation was prepared with Dragon dictation along with smaller phrase technology. Any transcriptional errors that result from this process are unintentional.

## 2017-05-18 NOTE — H&P (View-Only) (Signed)
PREOP HISTORY AND PHYSICAL  Date of surgery: 05/24/2017 Diagnosis: 1.  Postmenopausal bleeding 2.  Endometrial polyps on biopsy 3.  AGUS Pap smear   Patient is a 52 y.o. G3P2075female scheduled for surgery on 05/24/2017. Postmenopausal bleeding developed in December 2018. Workup for postmenopausal bleeding has included a benign endocervical polyp removal (05/05/2017; endometrial biopsy revealed benign endometrial polyp without evidence of hyperplasia or carcinoma (05/05/2017); AGUS Pap smear; negative high risk HPV Pap smear (05/05/2017); ultrasound demonstrates a subserosal and submucosal fibroid with an endometrial stripe measuring 4.5 mm and normal ovaries (03/29/2017.  OB History    Gravida Para Term Preterm AB Living   3 2 2   1 2    SAB TAB Ectopic Multiple Live Births   1       2      No LMP recorded. Patient is perimenopausal.    Past Medical History:  Diagnosis Date  . Anxiety   . Arthritis   . Fibromyalgia   . Hypertension    no longer has hypertension  . Neuromuscular disorder ([place])   . Sleep apnea    does not use C-PAP    Past Surgical History:  Procedure Laterality Date  . DILATION AND CURETTAGE OF UTERUS    . GANGLION CYST EXCISION Bilateral   . GANGLION CYST EXCISION Right 10/08/2015   Procedure: REMOVAL GANGLION OF WRIST;  Surgeon: Hessie Knows, MD;  Location: [facility] ORS;  Service: Orthopedics;  Laterality: Right;  . GASTRIC BYPASS    . HAND SURGERY Bilateral Resection  . TUBAL LIGATION      OB History  Gravida Para Term Preterm AB Living  3 2 2   1 2   SAB TAB Ectopic Multiple Live Births  1       2    # Outcome Date GA Lbr Len/2nd Weight Sex Delivery Anes PTL Lv  3 Term 2000    M Vag-Spont   LIV  2 Term 1994    F Vag-Spont   LIV  1 SAB               Social History   Socioeconomic History  . Marital status: Married    Spouse name: None  . Number of children: None  . Years of education: None  . Highest education level: None  Social Needs  .  Financial resource strain: None  . Food insecurity - worry: None  . Food insecurity - inability: None  . Transportation needs - medical: None  . Transportation needs - non-medical: None  Occupational History  . None  Tobacco Use  . Smoking status: Former Smoker    Packs/day: 0.50    Years: 29.00    Pack years: 14.50    Types: Cigarettes    Last attempt to quit: 01/29/2015    Years since quitting: 2.3  . Smokeless tobacco: Never Used  Substance and Sexual Activity  . Alcohol use: No    Alcohol/week: 0.0 oz  . Drug use: No  . Sexual activity: Yes  Other Topics Concern  . None  Social History Narrative  . None    Family History  Problem Relation Age of Onset  . COPD Father   . Thyroid disease Daughter   . Eczema Son   . Heart disease Maternal Grandfather   . Diabetes Paternal Grandfather   . Heart disease Paternal Grandfather      (Not in a hospital admission)  Allergies  Allergen Reactions  . Oxycodone-Acetaminophen Itching and Other (See Comments)  Reaction:  Dizziness and blurred vision   . Nsaids     Kidney disease  . Other Other (See Comments)    Patient states she is allergic to all narcotics  . Percocet [Oxycodone-Acetaminophen] Nausea And Vomiting and Other (See Comments)    Reaction:  Dizziness and blurred vision  Reaction:  Dizziness and blurred vision     Review of Systems Constitutional: No recent fever/chills/sweats Respiratory: No recent cough/bronchitis Cardiovascular: No chest pain Gastrointestinal: No recent nausea/vomiting/diarrhea Genitourinary: No UTI symptoms Hematologic/lymphatic:No history of coagulopathy or recent blood thinner use    Objective:    BP 136/89   Pulse (!) 58   Ht 5\' 9"  (1.753 m)   Wt 267 lb 6.4 oz (121.3 kg)   BMI 39.49 kg/m   General:   Normal  Skin:   normal  HEENT:  Normal  Neck:  Supple without Adenopathy or Thyromegaly  Lungs:   Heart:              Breasts:   Abdomen:  Pelvis:  M/S   Extremeties:   Neuro:    clear to auscultation bilaterally   Normal without murmur   Not Examined   soft, non-tender; bowel sounds normal; no masses,  no organomegaly   Exam deferred to OR  No CVAT  Warm/Dry   Normal         Assessment:    1.  Postmenopausal bleeding 2.  Endometrial polyp on endometrial biopsy 3.  AGUS Pap smear, unexplained   Plan:  1.  Hysteroscopy/D&C 2.  LEEP cone biopsy of the cervix  Preop counseling: Patient is to undergo hysteroscopy/D&C and LEEP cone biopsy of the cervix on 05/24/2017.  She is understanding of the planned procedures and is aware of and is accepting of all surgical risks which include but are not limited to bleeding, infection, pelvic organ injury with need for repair, blood clot disorders, anesthesia risk, etc.  All questions have been answered.  Informed consent is given.  Patient is ready willing to proceed with surgery as scheduled.  Brayton Mars, MD  Note: This dictation was prepared with Dragon dictation along with smaller phrase technology. Any transcriptional errors that result from this process are unintentional.

## 2017-05-18 NOTE — H&P (Signed)
PREOP HISTORY AND PHYSICAL  Date of surgery: 05/24/2017 Diagnosis: 1.  Postmenopausal bleeding 2.  Endometrial polyps on biopsy 3.  AGUS Pap smear   Patient is a 52 y.o. G3P2075female scheduled for surgery on 05/24/2017. Postmenopausal bleeding developed in December 2018. Workup for postmenopausal bleeding has included a benign endocervical polyp removal (05/05/2017; endometrial biopsy revealed benign endometrial polyp without evidence of hyperplasia or carcinoma (05/05/2017); AGUS Pap smear; negative high risk HPV Pap smear (05/05/2017); ultrasound demonstrates a subserosal and submucosal fibroid with an endometrial stripe measuring 4.5 mm and normal ovaries (03/29/2017.  OB History    Gravida Para Term Preterm AB Living   3 2 2   1 2    SAB TAB Ectopic Multiple Live Births   1       2      No LMP recorded. Patient is perimenopausal.    Past Medical History:  Diagnosis Date  . Anxiety   . Arthritis   . Fibromyalgia   . Hypertension    no longer has hypertension  . Neuromuscular disorder ([place])   . Sleep apnea    does not use C-PAP    Past Surgical History:  Procedure Laterality Date  . DILATION AND CURETTAGE OF UTERUS    . GANGLION CYST EXCISION Bilateral   . GANGLION CYST EXCISION Right 10/08/2015   Procedure: REMOVAL GANGLION OF WRIST;  Surgeon: Hessie Knows, MD;  Location: [facility] ORS;  Service: Orthopedics;  Laterality: Right;  . GASTRIC BYPASS    . HAND SURGERY Bilateral Resection  . TUBAL LIGATION      OB History  Gravida Para Term Preterm AB Living  3 2 2   1 2   SAB TAB Ectopic Multiple Live Births  1       2    # Outcome Date GA Lbr Len/2nd Weight Sex Delivery Anes PTL Lv  3 Term 2000    M Vag-Spont   LIV  2 Term 1994    F Vag-Spont   LIV  1 SAB               Social History   Socioeconomic History  . Marital status: Married    Spouse name: None  . Number of children: None  . Years of education: None  . Highest education level: None  Social Needs  .  Financial resource strain: None  . Food insecurity - worry: None  . Food insecurity - inability: None  . Transportation needs - medical: None  . Transportation needs - non-medical: None  Occupational History  . None  Tobacco Use  . Smoking status: Former Smoker    Packs/day: 0.50    Years: 29.00    Pack years: 14.50    Types: Cigarettes    Last attempt to quit: 01/29/2015    Years since quitting: 2.3  . Smokeless tobacco: Never Used  Substance and Sexual Activity  . Alcohol use: No    Alcohol/week: 0.0 oz  . Drug use: No  . Sexual activity: Yes  Other Topics Concern  . None  Social History Narrative  . None    Family History  Problem Relation Age of Onset  . COPD Father   . Thyroid disease Daughter   . Eczema Son   . Heart disease Maternal Grandfather   . Diabetes Paternal Grandfather   . Heart disease Paternal Grandfather      (Not in a hospital admission)  Allergies  Allergen Reactions  . Oxycodone-Acetaminophen Itching and Other (See Comments)  Reaction:  Dizziness and blurred vision   . Nsaids     Kidney disease  . Other Other (See Comments)    Patient states she is allergic to all narcotics  . Percocet [Oxycodone-Acetaminophen] Nausea And Vomiting and Other (See Comments)    Reaction:  Dizziness and blurred vision  Reaction:  Dizziness and blurred vision     Review of Systems Constitutional: No recent fever/chills/sweats Respiratory: No recent cough/bronchitis Cardiovascular: No chest pain Gastrointestinal: No recent nausea/vomiting/diarrhea Genitourinary: No UTI symptoms Hematologic/lymphatic:No history of coagulopathy or recent blood thinner use    Objective:    BP 136/89   Pulse (!) 58   Ht 5\' 9"  (1.753 m)   Wt 267 lb 6.4 oz (121.3 kg)   BMI 39.49 kg/m   General:   Normal  Skin:   normal  HEENT:  Normal  Neck:  Supple without Adenopathy or Thyromegaly  Lungs:   Heart:              Breasts:   Abdomen:  Pelvis:  M/S   Extremeties:   Neuro:    clear to auscultation bilaterally   Normal without murmur   Not Examined   soft, non-tender; bowel sounds normal; no masses,  no organomegaly   Exam deferred to OR  No CVAT  Warm/Dry   Normal         Assessment:    1.  Postmenopausal bleeding 2.  Endometrial polyp on endometrial biopsy 3.  AGUS Pap smear, unexplained   Plan:  1.  Hysteroscopy/D&C 2.  LEEP cone biopsy of the cervix  Preop counseling: Patient is to undergo hysteroscopy/D&C and LEEP cone biopsy of the cervix on 05/24/2017.  She is understanding of the planned procedures and is aware of and is accepting of all surgical risks which include but are not limited to bleeding, infection, pelvic organ injury with need for repair, blood clot disorders, anesthesia risk, etc.  All questions have been answered.  Informed consent is given.  Patient is ready willing to proceed with surgery as scheduled.  Brayton Mars, MD  Note: This dictation was prepared with Dragon dictation along with smaller phrase technology. Any transcriptional errors that result from this process are unintentional.

## 2017-05-20 ENCOUNTER — Other Ambulatory Visit: Payer: Self-pay

## 2017-05-20 ENCOUNTER — Encounter
Admission: RE | Admit: 2017-05-20 | Discharge: 2017-05-20 | Disposition: A | Discharge status: Home / Self Care | Payer: BLUE CROSS/BLUE SHIELD | Source: Ambulatory Visit | Attending: Obstetrics and Gynecology | Admitting: Obstetrics and Gynecology

## 2017-05-20 DIAGNOSIS — Z01818 Encounter for other preprocedural examination: Secondary | ICD-10-CM | POA: Insufficient documentation

## 2017-05-20 DIAGNOSIS — R87618 Other abnormal cytological findings on specimens from cervix uteri: Secondary | ICD-10-CM | POA: Insufficient documentation

## 2017-05-20 DIAGNOSIS — N840 Polyp of corpus uteri: Secondary | ICD-10-CM | POA: Diagnosis not present

## 2017-05-20 LAB — CBC WITH DIFFERENTIAL/PLATELET
Basophils Absolute: 0.1 10*3/uL (ref 0–0.1)
Basophils Relative: 1 %
Eosinophils Absolute: 0.3 10*3/uL (ref 0–0.7)
Eosinophils Relative: 3 %
HCT: 40.7 % (ref 35.0–47.0)
HEMOGLOBIN: 13.4 g/dL (ref 12.0–16.0)
LYMPHS ABS: 2.7 10*3/uL (ref 1.0–3.6)
LYMPHS PCT: 30 %
MCH: 28.3 pg (ref 26.0–34.0)
MCHC: 32.8 g/dL (ref 32.0–36.0)
MCV: 86.4 fL (ref 80.0–100.0)
Monocytes Absolute: 0.5 10*3/uL (ref 0.2–0.9)
Monocytes Relative: 6 %
NEUTROS PCT: 60 %
Neutro Abs: 5.3 10*3/uL (ref 1.4–6.5)
Platelets: 306 10*3/uL (ref 150–440)
RBC: 4.72 MIL/uL (ref 3.80–5.20)
RDW: 13.9 % (ref 11.5–14.5)
WBC: 8.9 10*3/uL (ref 3.6–11.0)

## 2017-05-20 LAB — TYPE AND SCREEN
ABO/RH(D): O NEG
ANTIBODY IDENTIFICATION: NOT DETECTED
ANTIBODY SCREEN: POSITIVE

## 2017-05-20 LAB — RAPID HIV SCREEN (HIV 1/2 AB+AG)
HIV 1/2 Antibodies: NONREACTIVE
HIV-1 P24 Antigen - HIV24: NONREACTIVE

## 2017-05-20 NOTE — Patient Instructions (Addendum)
  Your procedure is scheduled on: Monday May 24, 2017 Report to Same Day Surgery 2nd floor medical mall ([place] Entrance-take elevator on left to 2nd floor.  Check in with surgery information desk.) To find out your arrival time please call [phone] between 1PM - 3PM on Friday May 21, 2017  Remember: Instructions that are not followed completely may result in serious medical risk, up to and including death, or upon the discretion of your surgeon and anesthesiologist your surgery may need to be rescheduled.    _x___ 1. Do not eat food after midnight the night before your procedure. You may drink clear liquids up to 2 hours before you are scheduled to arrive at the hospital for your procedure.  Do not drink clear liquids within 2 hours of your scheduled arrival to the hospital.  Clear liquids include  --Water or Apple juice without pulp  --Clear carbohydrate beverage such as ClearFast or Gatorade  --Black Coffee or Clear Tea (No milk, no creamers, do not add anything to                  the coffee or Tea Type 1 and type 2 diabetics should only drink water.  No gum chewing or hard candies.     __x__ 2. No Alcohol for 24 hours before or after surgery.   __x__3. No Smoking or e-cigarettes for 24 prior to surgery.  Do not use any chewable tobacco products for at least 6 hour prior to surgery   ____  4. Bring all medications with you on the day of surgery if instructed.    __x__ 5. Notify your doctor if there is any change in your medical condition     (cold, fever, infections).    x___6. On the morning of surgery brush your teeth with toothpaste and water.  You may rinse your mouth with mouth wash if you wish.  Do not swallow any toothpaste or mouthwash.   Do not wear jewelry, make-up, hairpins, clips or nail polish.  Do not wear lotions, powders, or perfumes. You may wear deodorant.  Do not shave 48 hours prior to surgery. Men may shave face and neck.  Do not bring  valuables to the hospital.    [facility] is not responsible for any belongings or valuables.               Contacts, dentures or bridgework may not be worn into surgery.  Leave your suitcase in the car. After surgery it may be brought to your room.  For patients admitted to the hospital, discharge time is determined by your                       treatment team.  _  Patients discharged the day of surgery will not be allowed to drive home.  You will need someone to drive you home and stay with you the night of your procedure.    Please read over the following fact sheets that you were given:   [facility] Preparing for Surgery and or MRSA Information   _x___ Take anti-hypertensive listed below, cardiac, seizure, asthma,     anti-reflux and psychiatric medicines. These include:  1. OK to take all medicines normally  _x___ Use CHG Soap or sage wipes as directed on instruction sheet   _x___ Follow recommendations from Cardiologist, Pulmonologist or PCP regarding stopping Aspirin, Coumadin, Plavix ,Eliquis, Effient, or Pradaxa, and Pletal.

## 2017-05-20 NOTE — Pre-Procedure Instructions (Signed)
Received call from blood bank re: positive Ab screen, this RN called pt to inquire re: blood transfusion hx.  Per pt no hx of blood transfusions, relayed to blood bank.  Pt's questions answered by this RN.

## 2017-05-21 LAB — RPR: RPR: NONREACTIVE

## 2017-05-24 ENCOUNTER — Ambulatory Visit: Payer: BLUE CROSS/BLUE SHIELD | Admitting: Certified Registered Nurse Anesthetist

## 2017-05-24 ENCOUNTER — Other Ambulatory Visit: Payer: Self-pay

## 2017-05-24 ENCOUNTER — Encounter
Admission: RE | Disposition: A | Discharge status: Home / Self Care | Payer: Self-pay | Source: Ambulatory Visit | Attending: Obstetrics and Gynecology

## 2017-05-24 ENCOUNTER — Ambulatory Visit
Admission: RE | Admit: 2017-05-24 | Discharge: 2017-05-24 | Disposition: A | Discharge status: Home / Self Care | Payer: BLUE CROSS/BLUE SHIELD | Source: Ambulatory Visit | Attending: Obstetrics and Gynecology | Admitting: Obstetrics and Gynecology

## 2017-05-24 DIAGNOSIS — Z9889 Other specified postprocedural states: Secondary | ICD-10-CM

## 2017-05-24 DIAGNOSIS — D259 Leiomyoma of uterus, unspecified: Secondary | ICD-10-CM | POA: Diagnosis not present

## 2017-05-24 DIAGNOSIS — N840 Polyp of corpus uteri: Secondary | ICD-10-CM | POA: Diagnosis not present

## 2017-05-24 DIAGNOSIS — N950 Postmenopausal bleeding: Secondary | ICD-10-CM | POA: Insufficient documentation

## 2017-05-24 DIAGNOSIS — G4730 Sleep apnea, unspecified: Secondary | ICD-10-CM | POA: Insufficient documentation

## 2017-05-24 DIAGNOSIS — N72 Inflammatory disease of cervix uteri: Secondary | ICD-10-CM | POA: Diagnosis not present

## 2017-05-24 DIAGNOSIS — N181 Chronic kidney disease, stage 1: Secondary | ICD-10-CM | POA: Diagnosis not present

## 2017-05-24 DIAGNOSIS — Z9989 Dependence on other enabling machines and devices: Secondary | ICD-10-CM | POA: Diagnosis not present

## 2017-05-24 DIAGNOSIS — N889 Noninflammatory disorder of cervix uteri, unspecified: Secondary | ICD-10-CM | POA: Diagnosis not present

## 2017-05-24 DIAGNOSIS — I129 Hypertensive chronic kidney disease with stage 1 through stage 4 chronic kidney disease, or unspecified chronic kidney disease: Secondary | ICD-10-CM | POA: Diagnosis not present

## 2017-05-24 DIAGNOSIS — I10 Essential (primary) hypertension: Secondary | ICD-10-CM | POA: Insufficient documentation

## 2017-05-24 DIAGNOSIS — G4733 Obstructive sleep apnea (adult) (pediatric): Secondary | ICD-10-CM | POA: Diagnosis not present

## 2017-05-24 DIAGNOSIS — Z87891 Personal history of nicotine dependence: Secondary | ICD-10-CM | POA: Diagnosis not present

## 2017-05-24 DIAGNOSIS — Z9884 Bariatric surgery status: Secondary | ICD-10-CM | POA: Insufficient documentation

## 2017-05-24 HISTORY — PX: HYSTEROSCOPY WITH D & C: SHX1775

## 2017-05-24 HISTORY — PX: LEEP: SHX91

## 2017-05-24 LAB — ABO/RH: ABO/RH(D): O NEG

## 2017-05-24 SURGERY — LEEP (LOOP ELECTROSURGICAL EXCISION PROCEDURE)
Anesthesia: General

## 2017-05-24 MED ORDER — LIDOCAINE-EPINEPHRINE 1 %-1:100000 IJ SOLN
INTRAMUSCULAR | Status: AC
  Filled 2017-05-24: qty 1

## 2017-05-24 MED ORDER — DEXAMETHASONE SODIUM PHOSPHATE 10 MG/ML IJ SOLN
INTRAMUSCULAR | Status: DC | PRN
  Administered 2017-05-24: 10 mg via INTRAVENOUS

## 2017-05-24 MED ORDER — LIDOCAINE HCL (CARDIAC) 20 MG/ML IV SOLN
INTRAVENOUS | Status: DC | PRN
  Administered 2017-05-24: 80 mg via INTRAVENOUS

## 2017-05-24 MED ORDER — LACTATED RINGERS IV SOLN
INTRAVENOUS | Status: DC
  Administered 2017-05-24: 13:00:00 via INTRAVENOUS

## 2017-05-24 MED ORDER — ONDANSETRON HCL 4 MG/2ML IJ SOLN: 4.0000 mg | Freq: Once | INTRAMUSCULAR | Status: DC | PRN

## 2017-05-24 MED ORDER — FENTANYL CITRATE (PF) 100 MCG/2ML IJ SOLN: 25.0000 ug | INTRAMUSCULAR | Status: DC | PRN

## 2017-05-24 MED ORDER — MIDAZOLAM HCL 2 MG/2ML IJ SOLN
INTRAMUSCULAR | Status: DC | PRN
  Administered 2017-05-24: 2 mg via INTRAVENOUS

## 2017-05-24 MED ORDER — ACETAMINOPHEN 10 MG/ML IV SOLN
INTRAVENOUS | Status: DC | PRN
  Administered 2017-05-24: 1000 mg via INTRAVENOUS

## 2017-05-24 MED ORDER — IODINE STRONG (LUGOLS) 5 % PO SOLN
ORAL | Status: AC
  Filled 2017-05-24: qty 1

## 2017-05-24 MED ORDER — FERRIC SUBSULFATE 259 MG/GM EX SOLN
CUTANEOUS | Status: AC
  Filled 2017-05-24: qty 8

## 2017-05-24 MED ORDER — IODINE STRONG (LUGOLS) 5 % PO SOLN
ORAL | Status: DC | PRN
  Administered 2017-05-24: 3 mL

## 2017-05-24 MED ORDER — ACETAMINOPHEN NICU IV SYRINGE 10 MG/ML
INTRAVENOUS | Status: AC
  Filled 2017-05-24: qty 1

## 2017-05-24 MED ORDER — LACTATED RINGERS IV SOLN
INTRAVENOUS | Status: DC
  Administered 2017-05-24: 14:00:00 via INTRAVENOUS

## 2017-05-24 MED ORDER — ONDANSETRON HCL 4 MG/2ML IJ SOLN
INTRAMUSCULAR | Status: DC | PRN
  Administered 2017-05-24: 4 mg via INTRAVENOUS

## 2017-05-24 MED ORDER — PROPOFOL 10 MG/ML IV BOLUS
INTRAVENOUS | Status: AC
  Filled 2017-05-24: qty 20

## 2017-05-24 MED ORDER — MIDAZOLAM HCL 2 MG/2ML IJ SOLN
INTRAMUSCULAR | Status: AC
  Filled 2017-05-24: qty 2

## 2017-05-24 MED ORDER — PROPOFOL 10 MG/ML IV BOLUS
INTRAVENOUS | Status: DC | PRN
  Administered 2017-05-24: 150 mg via INTRAVENOUS

## 2017-05-24 SURGICAL SUPPLY — 36 items
APPLICATOR COTTON TIP 6IN STRL (MISCELLANEOUS) ×2 IMPLANT
APPLICATOR SWAB PROCTO LG 16IN (MISCELLANEOUS) ×2 IMPLANT
BAG INFUSER PRESSURE 100CC (MISCELLANEOUS) ×2 IMPLANT
CANISTER SUCT 1200ML W/VALVE (MISCELLANEOUS) ×2 IMPLANT
CATH ROBINSON RED A/P 16FR (CATHETERS) ×2 IMPLANT
DEPRESSOR TONGUE BLADE STERILE (MISCELLANEOUS) ×2 IMPLANT
DRAPE UNDER BUTTOCK W/FLU (DRAPES) ×2 IMPLANT
DRSG TELFA 3X8 NADH (GAUZE/BANDAGES/DRESSINGS) ×2 IMPLANT
ELECT LEEP BALL 5MM 12CM (MISCELLANEOUS) ×2
ELECT LEEP LOOP 20X10 R2010 (MISCELLANEOUS) ×2
ELECT LOOP 1.0X1.0CM R1010 (MISCELLANEOUS) ×2
ELECT REM PT RETURN 9FT ADLT (ELECTROSURGICAL) ×2
ELECTRODE LEEP BALL 5MM 12CM (MISCELLANEOUS) ×1 IMPLANT
ELECTRODE LEP LOOP 20X10 R2010 (MISCELLANEOUS) ×1 IMPLANT
ELECTRODE LOOP 1.0X1.0CM R1010 (MISCELLANEOUS) ×1 IMPLANT
ELECTRODE REM PT RTRN 9FT ADLT (ELECTROSURGICAL) ×1 IMPLANT
GLOVE BIO SURGEON STRL SZ8 (GLOVE) ×2 IMPLANT
GOWN STRL REUS W/ TWL LRG LVL3 (GOWN DISPOSABLE) ×2 IMPLANT
GOWN STRL REUS W/ TWL XL LVL3 (GOWN DISPOSABLE) ×1 IMPLANT
GOWN STRL REUS W/TWL LRG LVL3 (GOWN DISPOSABLE) ×2
GOWN STRL REUS W/TWL XL LVL3 (GOWN DISPOSABLE) ×1
HANDLE YANKAUER SUCT BULB TIP (MISCELLANEOUS) ×2 IMPLANT
IV LACTATED RINGERS 1000ML (IV SOLUTION) ×2 IMPLANT
KIT TURNOVER CYSTO (KITS) ×2 IMPLANT
LABEL OR SOLS (LABEL) ×2 IMPLANT
NEEDLE SPNL 22GX3.5 QUINCKE BK (NEEDLE) ×2 IMPLANT
PACK DNC HYST (MISCELLANEOUS) ×2 IMPLANT
PAD OB MATERNITY 4.3X12.25 (PERSONAL CARE ITEMS) ×2 IMPLANT
PAD PREP 24X41 OB/GYN DISP (PERSONAL CARE ITEMS) ×2 IMPLANT
PENCIL ELECTRO HAND CTR (MISCELLANEOUS) ×2 IMPLANT
SOL PREP PVP 2OZ (MISCELLANEOUS) ×2
SOLUTION PREP PVP 2OZ (MISCELLANEOUS) ×1 IMPLANT
STRAW SMOKE EVAC LEEP 6150 NON (MISCELLANEOUS) ×2 IMPLANT
SUT SILK 2 0 SH (SUTURE) ×2 IMPLANT
SYR CONTROL 10ML (SYRINGE) ×2 IMPLANT
TOWEL OR 17X26 4PK STRL BLUE (TOWEL DISPOSABLE) ×2 IMPLANT

## 2017-05-24 NOTE — Interval H&P Note (Signed)
History and Physical Interval Note:  05/24/2017 1:24 PM  Cathy Mitchell  has presented today for surgery, with the diagnosis of POST MENOPAUSAL BLEEDING  The various methods of treatment have been discussed with the patient and family. After consideration of risks, benefits and other options for treatment, the patient has consented to  Procedure(s): LOOP ELECTROSURGICAL EXCISION PROCEDURE (LEEP) (N/A) DILATATION AND CURETTAGE /HYSTEROSCOPY (N/A) as a surgical intervention .  The patient's history has been reviewed, patient examined, no change in status, stable for surgery.  I have reviewed the patient's chart and labs.  Questions were answered to the patient's satisfaction.     Hassell Done A Zaria Taha

## 2017-05-24 NOTE — Discharge Instructions (Signed)

## 2017-05-24 NOTE — Anesthesia Post-op Follow-up Note (Signed)
Anesthesia QCDR form completed.        

## 2017-05-24 NOTE — Anesthesia Preprocedure Evaluation (Addendum)
Anesthesia Evaluation  Patient identified by MRN, date of birth, ID band Patient awake    Reviewed: Allergy & Precautions, H&P , NPO status , Patient's Chart, lab work & pertinent test results, reviewed documented beta blocker date and time   Airway Mallampati: III  TM Distance: >3 FB Neck ROM: full    Dental  (+) Poor Dentition, Edentulous Upper, Edentulous Lower   Pulmonary neg pulmonary ROS, sleep apnea and Continuous Positive Airway Pressure Ventilation , former smoker,    Pulmonary exam normal        Cardiovascular Exercise Tolerance: Good hypertension, On Medications negative cardio ROS Normal cardiovascular exam Rate:Normal     Neuro/Psych  Headaches, Anxiety  Neuromuscular disease negative neurological ROS  negative psych ROS   GI/Hepatic negative GI ROS, Neg liver ROS,   Endo/Other  negative endocrine ROS  Renal/GU Renal diseasenegative Renal ROS  negative genitourinary   Musculoskeletal   Abdominal   Peds  Hematology negative hematology ROS (+)   Anesthesia Other Findings   Reproductive/Obstetrics negative OB ROS                             Anesthesia Physical Anesthesia Plan  ASA: III  Anesthesia Plan: General LMA   Post-op Pain Management:    Induction:   PONV Risk Score and Plan: 4 or greater  Airway Management Planned:   Additional Equipment:   Intra-op Plan:   Post-operative Plan:   Informed Consent: I have reviewed the patients History and Physical, chart, labs and discussed the procedure including the risks, benefits and alternatives for the proposed anesthesia with the patient or authorized representative who has indicated his/her understanding and acceptance.     Plan Discussed with: CRNA  Anesthesia Plan Comments:         Anesthesia Quick Evaluation

## 2017-05-24 NOTE — Anesthesia Procedure Notes (Signed)
Performed by: Shelva Hetzer E, CRNA       

## 2017-05-24 NOTE — Anesthesia Procedure Notes (Signed)
Procedure Name: LMA Insertion Date/Time: 05/24/2017 2:30 PM Performed by: Philbert Riser, CRNA Pre-anesthesia Checklist: Patient identified, Emergency Drugs available, Suction available, Patient being monitored and Timeout performed Patient Re-evaluated:Patient Re-evaluated prior to induction Oxygen Delivery Method: Circle system utilized and Simple face mask Preoxygenation: Pre-oxygenation with 100% oxygen Induction Type: IV induction Ventilation: Mask ventilation without difficulty LMA: LMA inserted LMA Size: 4.0

## 2017-05-24 NOTE — Transfer of Care (Signed)
Immediate Anesthesia Transfer of Care Note  Patient: NASHAYLA TELLERIA  Procedure(s) Performed: LOOP ELECTROSURGICAL EXCISION PROCEDURE (LEEP) (N/A ) DILATATION AND CURETTAGE /HYSTEROSCOPY (N/A )  Patient Location: PACU  Anesthesia Type:General  Level of Consciousness: awake, alert  and oriented  Airway & Oxygen Therapy: Patient Spontanous Breathing  Post-op Assessment: Report given to RN  Post vital signs: Reviewed and stable  Last Vitals:  Vitals:   05/24/17 1229  BP: (!) 118/93  Pulse: 77  Resp: 16  Temp: (!) 36.2 C  SpO2: 99%    Last Pain:  Vitals:   05/24/17 1229  TempSrc: Temporal         Complications: No apparent anesthesia complications

## 2017-05-24 NOTE — Op Note (Signed)
OPERATIVE NOTE:  MALEEYA PETERKIN PROCEDURE DATE: 05/24/2017   PREOPERATIVE DIAGNOSIS: 1.  Abnormal uterine bleeding 2.  AGUS Pap smear 3.  Uterine fibroids  POSTOPERATIVE DIAGNOSIS: 1.  Abnormal uterine bleeding 2.  AGUS Pap smear 3.  Uterine fibroids  PROCEDURE: 1.  LEEP cone biopsy 2.  Hysteroscopy/D&C SURGEON:  Brayton Mars, MD ASSISTANTS: PA-S Krista Blue ANESTHESIA: General INDICATIONS: 52 y.o. [id number] with history of abnormal uterine bleeding; workup included ultrasound that demonstrated uterine fibroids and a slightly prominent endometrial stripe.  Endometrial biopsy was suspicious for endometrial polyp.  Pap smear returned with AGUS findings.  FINDINGS: Pelvis gynecoid; possible LAVH candidate Uterus-retroverted, normal size and shape; sounded to 6 cm (post LEEP procedure) Lugol's solution-normal staining of cervix   I/O's: No intake/output data recorded. COUNTS:  YES SPECIMENS: 1.  Ectocervical LEEP 2.  Endocervical LEEP 3.  Post LEEP ECC 4.  Endometrial curettings ANTIBIOTIC PROPHYLAXIS:N/A COMPLICATIONS: None immediate  PROCEDURE IN DETAIL: Patient brought to the operating room and placed in supine position.  General anesthesia was induced without difficulty.  She was placed in the dorsolithotomy position using the bumblebee stirrups.  A Betadine perineal prep and drape was performed in standard fashion. Timeout was completed. The bladder was not drained of urine due to the patient's concern about post void urinary dysfunction. A coated speculum was placed into the vagina.  The vagina and cervix were painted with Lugol's solution; a normal staining pattern of the vagina and cervix was noted. Paracervical block was performed using a 22-gauge spinal needle and 1% lidocaine with 1-100,000 epinephrine; 15 cc were injected at the 3 and 9:00 positions respectively, as well as several cc placed at the 12 and 6 o'clock position. The ectocervical LEEP was then  performed using a wide loop. The endocervical LEEP was then performed using a narrower loop. Post LEEP ECC was performed with a serrated curette. Single tooth tenaculum was placed on the cervix.  The uterus was sounded to 6 cm.  The endocervical canal was dilated using Hanks dilators up to a #20 [place] caliber.  The Myosure hysteroscope was used with lactated Ringer's as irrigant to assess the intrauterine cavity.  No gross lesions were identified.  Endometrial curetting was performed with both smooth and serrated curettes.  Minimal tissue was obtained.  Following completion of the D&C, the cone bed was cauterized using the rollerball cautery.  Astringent was applied to the cone bed.  Patient was then awakened mobilized and taken to recovery room in satisfactory condition.  Tanaja Ganger A. Zipporah Plants, MD, ACOG [facility]

## 2017-05-25 ENCOUNTER — Encounter: Payer: Self-pay | Admitting: Obstetrics and Gynecology

## 2017-05-25 DIAGNOSIS — H6123 Impacted cerumen, bilateral: Secondary | ICD-10-CM | POA: Diagnosis not present

## 2017-05-25 DIAGNOSIS — H903 Sensorineural hearing loss, bilateral: Secondary | ICD-10-CM | POA: Diagnosis not present

## 2017-05-25 DIAGNOSIS — H93299 Other abnormal auditory perceptions, unspecified ear: Secondary | ICD-10-CM | POA: Diagnosis not present

## 2017-05-26 ENCOUNTER — Encounter: Payer: Self-pay | Admitting: Obstetrics and Gynecology

## 2017-05-26 NOTE — Anesthesia Postprocedure Evaluation (Signed)
Anesthesia Post Note  Patient: Cathy Mitchell  Procedure(s) Performed: LOOP ELECTROSURGICAL EXCISION PROCEDURE (LEEP) (N/A ) DILATATION AND CURETTAGE /HYSTEROSCOPY (N/A )  Patient location during evaluation: PACU Anesthesia Type: General Level of consciousness: awake and alert Pain management: pain level controlled Vital Signs Assessment: post-procedure vital signs reviewed and stable Respiratory status: spontaneous breathing, nonlabored ventilation, respiratory function stable and patient connected to nasal cannula oxygen Cardiovascular status: blood pressure returned to baseline and stable Postop Assessment: no apparent nausea or vomiting Anesthetic complications: no     Last Vitals:  Vitals:   05/24/17 1545 05/24/17 1559  BP:  (!) 141/100  Pulse: 69   Resp: 15 16  Temp: 37 C (!) 36.2 C  SpO2:  100%    Last Pain:  Vitals:   05/25/17 1338  TempSrc:   PainSc: 2                  Molli Barrows

## 2017-05-28 ENCOUNTER — Encounter: Payer: Self-pay | Admitting: Obstetrics and Gynecology

## 2017-05-28 LAB — SURGICAL PATHOLOGY

## 2017-05-31 ENCOUNTER — Ambulatory Visit (INDEPENDENT_AMBULATORY_CARE_PROVIDER_SITE_OTHER): Payer: BLUE CROSS/BLUE SHIELD | Admitting: Obstetrics and Gynecology

## 2017-05-31 ENCOUNTER — Encounter: Payer: Self-pay | Admitting: Obstetrics and Gynecology

## 2017-05-31 VITALS — Ht 69.0 in

## 2017-05-31 DIAGNOSIS — Z09 Encounter for follow-up examination after completed treatment for conditions other than malignant neoplasm: Secondary | ICD-10-CM

## 2017-05-31 DIAGNOSIS — R87619 Unspecified abnormal cytological findings in specimens from cervix uteri: Secondary | ICD-10-CM

## 2017-05-31 DIAGNOSIS — Z9889 Other specified postprocedural states: Secondary | ICD-10-CM

## 2017-05-31 NOTE — Patient Instructions (Signed)
Patient left before being seen Pathology results released to my chart

## 2017-05-31 NOTE — Progress Notes (Signed)
Chief complaint: 1.  Postop check-1 week 2.  Status post LEEP cone biopsy; status post hysteroscopy/D&C 3.  History of AGUS Pap smear; abnormal uterine bleeding; uterine fibroid  Patient presents for postop check.  Visibly anxious and troubled for uncertain issues. Patient refused to answer intake questions by nurse. Patient declined vital signs assessment. Patient left before being seen.  OBJECTIVE:  Surgical Pathology  CASE: [id number]  PATIENT: Cathy Mitchell  Surgical Pathology Report      SPECIMEN SUBMITTED:  A. Ectocervical LEEP  B. Endocervical LEEP  C. Endocervical curettings, post LEEP  D. Endometrial curettings   CLINICAL HISTORY:  None provided   PRE-OPERATIVE DIAGNOSIS:  Post menopausal bleeding   POST-OPERATIVE DIAGNOSIS:  Same as pre-op      DIAGNOSIS:  A. ENDOCERVIX; LEEP:  - SQUAMOUS METAPLASIA.  - ACUTE AND CHRONIC CERVICITIS.  - DEEPER LEVELS WERE EXAMINED.   B. ENDOCERVIX; LEEP:  - SQUAMOUS METAPLASIA.  - CHRONIC CERVICITIS.  - DEEPER LEVELS WERE EXAMINED.   C. ENDOCERVICAL CURETTINGS, POST LEEP; DILATATION AND CURETTAGE:  - BENIGN CERVICAL TISSUE.  - BENIGN ENDOMETRIAL GLANDS.  - CAUTERY ARTIFACT PARTIALLY LIMITS INTERPRETATION.   D. ENDOMETRIAL CURETTINGS; DILATATION AND CURETTAGE:  - SCANT BENIGN ENDOMETRIAL TISSUE.       ASSESSMENT: 1.  1 week status post LEEP cone biopsy and hysteroscopy/D&C for evaluation of AGUS Pap smear and abnormal uterine bleeding 2.  History of thickened endometrium 4.5 mm on ultrasound and uterine fibroids on ultrasound 3.  Patient left before being seen  PLAN: 1.  Pathology results released to my chart  Brayton Mars, MD  Note: This dictation was prepared with Dragon dictation along with smaller phrase technology. Any transcriptional errors that result from this process are unintentional.

## 2017-05-31 NOTE — Progress Notes (Signed)
Pt refused vitals 

## 2017-06-04 ENCOUNTER — Encounter: Payer: Self-pay | Admitting: Unknown Physician Specialty

## 2017-06-04 ENCOUNTER — Encounter: Payer: Self-pay | Admitting: Obstetrics and Gynecology

## 2017-06-16 ENCOUNTER — Encounter: Payer: Self-pay | Admitting: Unknown Physician Specialty

## 2017-06-28 ENCOUNTER — Ambulatory Visit
Admission: RE | Admit: 2017-06-28 | Discharge: 2017-06-28 | Disposition: A | Discharge status: Home / Self Care | Payer: BLUE CROSS/BLUE SHIELD | Source: Ambulatory Visit | Attending: Family Medicine | Admitting: Family Medicine

## 2017-06-28 ENCOUNTER — Encounter: Payer: Self-pay | Admitting: Unknown Physician Specialty

## 2017-06-28 ENCOUNTER — Other Ambulatory Visit: Payer: Self-pay | Admitting: Family Medicine

## 2017-06-28 DIAGNOSIS — M79672 Pain in left foot: Secondary | ICD-10-CM | POA: Insufficient documentation

## 2017-06-28 DIAGNOSIS — R52 Pain, unspecified: Secondary | ICD-10-CM

## 2017-06-28 DIAGNOSIS — M19072 Primary osteoarthritis, left ankle and foot: Secondary | ICD-10-CM | POA: Diagnosis not present

## 2017-07-02 ENCOUNTER — Encounter: Payer: Self-pay | Admitting: Obstetrics and Gynecology

## 2017-09-06 DIAGNOSIS — R221 Localized swelling, mass and lump, neck: Secondary | ICD-10-CM | POA: Diagnosis not present

## 2017-09-15 ENCOUNTER — Other Ambulatory Visit: Payer: Self-pay | Admitting: Family Medicine

## 2017-09-16 ENCOUNTER — Other Ambulatory Visit: Payer: Self-pay | Admitting: Cardiovascular Disease

## 2017-09-17 NOTE — Telephone Encounter (Signed)
Flonase 50 mcg/act nasal spray refill request  LOV 04/30/17 with [facility]   Last refill:  03/11/17  6 refills  [facility] [address] [place] - [address]

## 2017-10-26 ENCOUNTER — Other Ambulatory Visit: Payer: Self-pay | Admitting: Cardiovascular Disease

## 2017-11-21 ENCOUNTER — Other Ambulatory Visit: Payer: Self-pay | Admitting: Cardiovascular Disease

## 2017-11-22 ENCOUNTER — Encounter: Payer: Self-pay | Admitting: Physician Assistant

## 2017-11-22 ENCOUNTER — Ambulatory Visit (INDEPENDENT_AMBULATORY_CARE_PROVIDER_SITE_OTHER): Payer: BLUE CROSS/BLUE SHIELD | Admitting: Physician Assistant

## 2017-11-22 VITALS — BP 137/87 | HR 59 | Temp 98.3°F | Ht 69.0 in | Wt 269.4 lb

## 2017-11-22 DIAGNOSIS — H9203 Otalgia, bilateral: Secondary | ICD-10-CM | POA: Diagnosis not present

## 2017-11-22 DIAGNOSIS — M170 Bilateral primary osteoarthritis of knee: Secondary | ICD-10-CM | POA: Diagnosis not present

## 2017-11-22 NOTE — Progress Notes (Signed)
Subjective:    Patient ID: Cathy Mitchell, female    DOB: 19-Feb-1966, 52 y.o.   MRN: [id number]  Cathy Mitchell is a 52 y.o. female presenting on 11/22/2017 for Form Completion ([employer] handicapped placard renewal)   HPI   Patient has severe osteoarthritis of bilateral knee joints, right worse than left. She has lost 100 lbs. She did have success with hyaluronic acid injections but is unable to afford these. She also has a tremor in her left side that makes it difficult to walk.   Wt Readings from Last 3 Encounters:  11/22/17 269 lb 6.4 oz (122.2 kg)  05/20/17 267 lb 9.6 oz (121.4 kg)  05/18/17 267 lb 6.4 oz (121.3 kg)     Social History   Tobacco Use  . Smoking status: Former Smoker    Packs/day: 0.50    Years: 29.00    Pack years: 14.50    Types: Cigarettes    Last attempt to quit: 01/29/2015    Years since quitting: 2.8  . Smokeless tobacco: Never Used  Substance Use Topics  . Alcohol use: No    Alcohol/week: 0.0 standard drinks  . Drug use: No    Review of Systems Per HPI unless specifically indicated above     Objective:    BP 137/87   Pulse (!) 59   Temp 98.3 F (36.8 C) (Oral)   Ht 5\' 9"  (1.753 m)   Wt 269 lb 6.4 oz (122.2 kg)   LMP 02/09/2017 (Within Weeks)   SpO2 98%   BMI 39.78 kg/m   Wt Readings from Last 3 Encounters:  11/22/17 269 lb 6.4 oz (122.2 kg)  05/20/17 267 lb 9.6 oz (121.4 kg)  05/18/17 267 lb 6.4 oz (121.3 kg)    Physical Exam  Constitutional: She is oriented to person, place, and time. She appears well-developed and well-nourished.  HENT:  Right Ear: Tympanic membrane, external ear and ear canal normal.  Left Ear: Tympanic membrane, external ear and ear canal normal.  Cardiovascular: Normal rate and regular rhythm.  Pulmonary/Chest: Effort normal and breath sounds normal.  Musculoskeletal: She exhibits edema.  Bilateral knee edema.   Neurological: She is alert and oriented to person, place, and time.   Results for orders placed or  performed during the hospital encounter of 05/24/17  ABO/Rh  Result Value Ref Range   ABO/RH(D)      O NEG Performed at [facility], [address] [place], [place] [address]   Surgical pathology  Result Value Ref Range   SURGICAL PATHOLOGY      Surgical Pathology CASE: [id number] PATIENT: Cathy Mitchell Surgical Pathology Report     SPECIMEN SUBMITTED: A. Ectocervical LEEP B. Endocervical LEEP C. Endocervical curettings, post LEEP D. Endometrial curettings  CLINICAL HISTORY: None provided  PRE-OPERATIVE DIAGNOSIS: Post menopausal bleeding  POST-OPERATIVE DIAGNOSIS: Same as pre-op     DIAGNOSIS: A.  ENDOCERVIX; LEEP: - SQUAMOUS METAPLASIA. - ACUTE AND CHRONIC CERVICITIS. - DEEPER LEVELS WERE EXAMINED.  B.  ENDOCERVIX; LEEP: - SQUAMOUS METAPLASIA. - CHRONIC CERVICITIS. - DEEPER LEVELS WERE EXAMINED.  C. ENDOCERVICAL CURETTINGS, POST LEEP; DILATATION AND CURETTAGE: - BENIGN CERVICAL TISSUE. - BENIGN ENDOMETRIAL GLANDS. - CAUTERY ARTIFACT PARTIALLY LIMITS INTERPRETATION.  D.  ENDOMETRIAL CURETTINGS; DILATATION AND CURETTAGE: - SCANT BENIGN ENDOMETRIAL TISSUE.   GROSS DESCRIPTION:  A. Labeled: ectocervical LEEP  Size: 2.5 x 1.7 x 0.6 cm with a 1 cm long slit like os  Ectocervix:  smooth pink-tan  Endocervix: soft red mucoid  Inking scheme: endocervical edge margin inked black and remaining margin blue, specimen unoriented  Block summary: 1-4-radially sectioned and submitted clockwise respectively  B. Labeled: endocervical LEEP  Size: 2.2 x 1.0 x 0.6 cm,  with a  1 cm slit like os  Ectocervix: none positively identified  Endocervix: soft pink-tan  Inking scheme: from contour what appears the endocervical margin inked black and remaining blue  Block summary: 1-4-radially sectioned and submitted clockwise respectively  C. Labeled: post Leep endocervical curetting  Tissue fragment(s): multiple  Description: in formalin,  mucoid and brown material  Entirely submitted in 1 cassette(s).  D. Labeled: endometrial curetting  Tissue fragment(s): multiple  Size: aggregate, 0.8 x 0.3 x 0.1 cm  Description:  in formalin, mucoid and brown material  Entirely submitted in 1 cassette(s).      Final Diagnosis performed by Delorse Lek, Artist Pais  Electronically signed 05/28/2017 10:00:18AM    The electronic signature indicates that the named Attending Pathologist has evaluated the specimen  Technical component performed at [facility], [address], [place], [place] [address] Lab: [phone] Dir: Rush Farmer, MD, MMM  Professional component performed at [facility], [facility], [place], [place], [place] [address] Lab: [phone] Dir: Dellia Nims. Reuel Derby, MD        Assessment & Plan:  1. Osteoarthritis of knee (Bilateral) (R>L)  Have reviewed knee MRI of right knee which shows severe osteoarthritis and also meniscal tear. Have filled out handicapped sign form today.   2. Otalgia of both ears  Ear exam benign.   I have spent 15 minutes with this patient, >50% of which was spent on counseling and coordination of care.  Follow up plan: Return if symptoms worsen or fail to improve.  Carles Collet, PA-C [facility] 11/23/2017, 12:48 PM

## 2017-11-23 NOTE — Patient Instructions (Signed)

## 2017-11-26 ENCOUNTER — Telehealth: Payer: Self-pay | Admitting: Cardiovascular Disease

## 2017-11-26 NOTE — Telephone Encounter (Signed)
Lmov for patient to call back and schedule appointment ° ° °

## 2017-11-26 NOTE — Telephone Encounter (Signed)
-----   Message from Anselm Pancoast, [place] sent at 11/22/2017 10:43 AM EDT ----- Please contact patient for a follow up. Patient was to come in July 2019 to see Arida. Requesting med refills.  Thanks, SHaron

## 2017-12-01 NOTE — Telephone Encounter (Signed)
No ans no vm   °

## 2017-12-02 NOTE — Telephone Encounter (Signed)
Patient scheduled for 12/03/17

## 2017-12-02 NOTE — Telephone Encounter (Signed)
Lmov for patient to call back and schedule appointment ° ° °

## 2017-12-03 ENCOUNTER — Encounter: Payer: Self-pay | Admitting: Cardiovascular Disease

## 2017-12-03 ENCOUNTER — Ambulatory Visit (INDEPENDENT_AMBULATORY_CARE_PROVIDER_SITE_OTHER): Payer: BLUE CROSS/BLUE SHIELD | Admitting: Cardiovascular Disease

## 2017-12-03 VITALS — BP 110/70 | HR 73 | Ht 69.0 in | Wt 269.8 lb

## 2017-12-03 DIAGNOSIS — I493 Ventricular premature depolarization: Secondary | ICD-10-CM

## 2017-12-03 MED ORDER — DILTIAZEM HCL ER COATED BEADS 120 MG PO CP24: 120.0000 mg | ORAL_CAPSULE | Freq: Every day | ORAL | 3 refills | Status: DC

## 2017-12-03 NOTE — Progress Notes (Signed)
Cardiology Office Note   Date:  12/03/2017   ID:  MELESSIA KAUS, DOB [birth year], MRN [id number]  PCP:  Kathrine Haddock, NP  Cardiologist:   Kathlyn Sacramento, MD   Chief Complaint  Patient presents with  . other    6 month follow up. Meds reviewed by the pt. verbally. Pt. c/o PVC's at times.       History of Present Illness: Cathy Mitchell is a 52 y.o. female who is here today for a follow-up visit regarding symptomatic PVCs.   She underwent a treadmill stress test in September 2016 before having gastric bypass surgery.  The stress test was normal.  She has no history of diabetes or hyperlipidemia. She does have sleep apnea and refuses CPAP.  She has no family history of premature coronary artery disease.  She is not a smoker and does not consume excessive amount of caffeine.  She does not drink alcohol.  Holter monitor and December 2018 showed very frequent PVCs with a total of 24,000 beats in 48 hours representing 12% burden. Echocardiogram showed normal LV systolic function with mild mitral regurgitation.   She was treated with metoprolol 25 mg twice daily initially with very good response but she now reports worsening palpitations especially when she tries to lie down and sleep.  She feels significantly limited by the symptoms.  She gets dizzy with it but no syncope.  No recent chest pain or shortness of breath.  Past Medical History:  Diagnosis Date  . Anxiety   . Arthritis   . Fibromyalgia   . Hypertension    no longer has hypertension  . Neuromuscular disorder ([place])    L-sided tremors  . Sleep apnea    does not use C-PAP    Past Surgical History:  Procedure Laterality Date  . DILATION AND CURETTAGE OF UTERUS    . GANGLION CYST EXCISION Bilateral   . GANGLION CYST EXCISION Right 10/08/2015   Procedure: REMOVAL GANGLION OF WRIST;  Surgeon: Hessie Knows, MD;  Location: [facility] ORS;  Service: Orthopedics;  Laterality: Right;  . GASTRIC BYPASS    . HAND SURGERY Bilateral  Resection  . HYSTEROSCOPY W/D&C N/A 05/24/2017   Procedure: DILATATION AND CURETTAGE /HYSTEROSCOPY;  Surgeon: Defrancesco, Alanda Slim, MD;  Location: [facility] ORS;  Service: Gynecology;  Laterality: N/A;  . LEEP N/A 05/24/2017   Procedure: LOOP ELECTROSURGICAL EXCISION PROCEDURE (LEEP);  Surgeon: Brayton Mars, MD;  Location: [facility] ORS;  Service: Gynecology;  Laterality: N/A;  . TUBAL LIGATION       Current Outpatient Medications  Medication Sig Dispense Refill  . acetaminophen (TYLENOL) 500 MG tablet Take 1,500 mg by mouth daily as needed for mild pain.    . Ascorbic Acid (VITAMIN C) 1000 MG tablet Take 1,000 mg by mouth daily.    Marland Kitchen azelastine (OPTIVAR) 0.05 % ophthalmic solution Place 1 drop into both eyes 2 (two) times daily. (Patient taking differently: Place 1 drop into both eyes 2 (two) times daily as needed (allergies). ) 6 mL 12  . cetirizine (ZYRTEC) 10 MG tablet Take 1 tablet (10 mg total) by mouth daily. (Patient taking differently: Take 10 mg by mouth daily as needed for allergies. ) 30 tablet 11  . cholecalciferol (VITAMIN D) 1000 units tablet Take 1,000 Units by mouth daily.    . Cholecalciferol 10000 units CAPS Take 1,000 Units by mouth daily.    . cyanocobalamin 1000 MCG tablet Take 1,000 mcg by mouth daily.    . diclofenac  sodium (VOLTAREN) 1 % GEL Apply 2 g topically daily as needed (pain).    . fluticasone (FLONASE) 50 MCG/ACT nasal spray Place 2 sprays into both nostrils daily as needed for allergies. 16 g 6  . glucosamine-chondroitin 500-400 MG tablet Take 1 tablet by mouth daily.    . metoprolol tartrate (LOPRESSOR) 25 MG tablet TAKE 1 TABLET BY MOUTH TWICE A DAY 60 tablet 0  . Multiple Minerals-Vitamins (CAL-MAG-ZINC-D PO) Take 1 tablet by mouth daily.    . Multiple Vitamins-Minerals (MULTIPLE VITAMINS/WOMENS) tablet Take 2 tablets by mouth daily. women's 50+    . Potassium 99 MG TABS Take 99 mg by mouth daily.    . Turmeric 500 MG TABS Take 1 tablet by mouth daily.      . Zinc 50 MG TABS Take 1 tablet by mouth daily.     No current facility-administered medications for this visit.     Allergies:   Oxycodone-acetaminophen; Nsaids; Other; and Percocet [oxycodone-acetaminophen]    Social History:  The patient  reports that she quit smoking about 2 years ago. Her smoking use included cigarettes. She has a 14.50 pack-year smoking history. She has never used smokeless tobacco. She reports that she does not drink alcohol or use drugs.   Family History:  The patient's family history includes COPD in her father; Diabetes in her paternal grandfather; Eczema in her son; Heart disease in her maternal grandfather and paternal grandfather; Thyroid disease in her daughter.    ROS:  Please see the history of present illness.   Otherwise, review of systems are positive for none.   All other systems are reviewed and negative.    PHYSICAL EXAM: VS:  BP 110/70 (BP Location: Left Arm, Patient Position: Sitting, Cuff Size: Large)   Pulse 73   Ht 5\' 9"  (1.753 m)   Wt 269 lb 12 oz (122.4 kg)   LMP 02/09/2017 (Within Weeks)   BMI 39.84 kg/m  , BMI Body mass index is 39.84 kg/m. GEN: Well nourished, well developed, in no acute distress  HEENT: normal  Neck: no JVD, carotid bruits, or masses Cardiac: RRR with premature beats; no murmurs, rubs, or gallops,no edema  Respiratory:  clear to auscultation bilaterally, normal work of breathing GI: soft, nontender, nondistended, + BS MS: no deformity or atrophy  Skin: warm and dry, no rash Neuro:  Strength and sensation are intact Psych: euthymic mood, full affect   EKG:  EKG is ordered today. The ekg ordered today demonstrates normal sinus rhythm with PVCs.  Incomplete right bundle branch block.   Recent Labs: 02/02/2017: BUN 15; Creatinine, Ser 0.62; Potassium 3.8; Sodium 136 03/22/2017: TSH 0.674 05/20/2017: Hemoglobin 13.4; Platelets 306    Lipid Panel    Component Value Date/Time   CHOL 145 07/31/2016 1110    TRIG 121 07/31/2016 1110   HDL 48 07/31/2016 1110   CHOLHDL 4.3 12/03/2014 1619   VLDL 27 12/03/2014 1619   LDLCALC 73 07/31/2016 1110      Wt Readings from Last 3 Encounters:  12/03/17 269 lb 12 oz (122.4 kg)  11/22/17 269 lb 6.4 oz (122.2 kg)  05/20/17 267 lb 9.6 oz (121.4 kg)       PAD Screen 03/02/2017  Previous PAD dx? No  Previous surgical procedure? No  Pain with walking? No  Feet/toe relief with dangling? No  Painful, non-healing ulcers? No  Extremities discolored? No      ASSESSMENT AND PLAN:  1.  Symptomatic PVCs: She had initial improvement with metoprolol  but she now reports significant worsening of symptoms especially at night.  Thus, I elected to switch her from metoprolol to diltiazem extended release 120 mg once daily.  I am planning to repeat a Holter monitor in 1 month from now.  If she continues to have significant burden of PVCs, I plan on referring her to EP.  2.  Sleep apnea: She refuses CPAP and this might be responsible for some of her PVCs and poor quality sleep.   Disposition:   FU with me in 6 months  Signed,  Kathlyn Sacramento, MD  12/03/2017 3:13 PM    [facility]

## 2017-12-03 NOTE — Patient Instructions (Addendum)
Medication Instructions:  Your physician has recommended you make the following change in your medication:  1. DISCONTINUE Metoprolol 2. Diltiazem 120 mg Once Daily   Testing/Procedures: Your physician has recommended that you wear a holter monitor. Holter monitors are medical devices that record the heart's electrical activity. Doctors most often use these monitors to diagnose arrhythmias. Arrhythmias are problems with the speed or rhythm of the heartbeat. The monitor is a small, portable device. You can wear one while you do your normal daily activities. This is usually used to diagnose what is causing palpitations/syncope (passing out).  Monitor to be scheduled in one month   Follow-Up: Your physician wants you to follow-up in: 6 months. You will receive a reminder letter in the mail two months in advance. If you don't receive a letter, please call our office to schedule the follow-up appointment.

## 2017-12-19 ENCOUNTER — Other Ambulatory Visit: Payer: Self-pay | Admitting: Cardiovascular Disease

## 2017-12-21 ENCOUNTER — Ambulatory Visit (INDEPENDENT_AMBULATORY_CARE_PROVIDER_SITE_OTHER): Payer: BLUE CROSS/BLUE SHIELD | Admitting: Physician Assistant

## 2017-12-21 ENCOUNTER — Encounter

## 2017-12-21 ENCOUNTER — Encounter: Payer: Self-pay | Admitting: Physician Assistant

## 2017-12-21 VITALS — BP 140/81 | HR 73 | Temp 98.1°F | Ht 69.0 in | Wt 263.8 lb

## 2017-12-21 DIAGNOSIS — H6690 Otitis media, unspecified, unspecified ear: Secondary | ICD-10-CM | POA: Diagnosis not present

## 2017-12-21 MED ORDER — AMOXICILLIN 875 MG PO TABS: 875.0000 mg | ORAL_TABLET | Freq: Two times a day (BID) | ORAL | 0 refills | Status: AC

## 2017-12-21 NOTE — Patient Instructions (Signed)
Otitis Media, Adult Otitis media is redness, soreness, and puffiness (swelling) in the space just behind your eardrum (middle ear). It may be caused by allergies or infection. It often happens along with a cold. Follow these instructions at home:  Take your medicine as told. Finish it even if you start to feel better.  Only take over-the-counter or prescription medicines for pain, discomfort, or fever as told by your doctor.  Follow up with your doctor as told. Contact a doctor if:  You have otitis media only in one ear, or bleeding from your nose, or both.  You notice a lump on your neck.  You are not getting better in 3-5 days.  You feel worse instead of better. Get help right away if:  You have pain that is not helped with medicine.  You have puffiness, redness, or pain around your ear.  You get a stiff neck.  You cannot move part of your face (paralysis).  You notice that the bone behind your ear hurts when you touch it. This information is not intended to replace advice given to you by your health care provider. Make sure you discuss any questions you have with your health care provider. Document Released: 09/02/2007 Document Revised: 08/22/2015 Document Reviewed: 10/11/2012 Elsevier Interactive Patient Education  2017 [employer]  

## 2017-12-21 NOTE — Progress Notes (Signed)
Subjective:    Patient ID: Cathy Mitchell, female    DOB: October 28, 1965, 52 y.o.   MRN: [id number]  Cathy Mitchell is a 51 y.o. female presenting on 12/21/2017 for URI (pt states she has had congestion, sore throat, headache, sinus pressure, and ear ache since last week. States she has taken 3 tablets of amoxicillin that were left over from a previous RX. )   HPI   Reports one week of URI that has been worsening. Says she has ENT and history of recurrent ear infections. Says nobody sees her ear infections with typical instruments and the ENT has to use a special instrument to see it.   Social History   Tobacco Use  . Smoking status: Former Smoker    Packs/day: 0.50    Years: 29.00    Pack years: 14.50    Types: Cigarettes    Last attempt to quit: 01/29/2015    Years since quitting: 2.8  . Smokeless tobacco: Never Used  Substance Use Topics  . Alcohol use: No    Alcohol/week: 0.0 standard drinks  . Drug use: No    Review of Systems Per HPI unless specifically indicated above     Objective:    BP 140/81   Pulse 73   Temp 98.1 F (36.7 C) (Oral)   Ht 5\' 9"  (1.753 m)   Wt 263 lb 12.8 oz (119.7 kg)   LMP 02/09/2017 (Within Weeks)   SpO2 98%   BMI 38.96 kg/m   Wt Readings from Last 3 Encounters:  12/21/17 263 lb 12.8 oz (119.7 kg)  12/03/17 269 lb 12 oz (122.4 kg)  11/22/17 269 lb 6.4 oz (122.2 kg)    Physical Exam  Constitutional: She is oriented to person, place, and time. She appears well-developed and well-nourished.  HENT:  Right Ear: External ear normal.  Left Ear: External ear normal.  Mouth/Throat: Oropharynx is clear and moist. No oropharyngeal exudate.  TMs opaque bilaterally.   Neck: Neck supple.  Cardiovascular: Normal rate, regular rhythm and normal heart sounds.  Pulmonary/Chest: Effort normal and breath sounds normal.  Lymphadenopathy:    She has no cervical adenopathy.  Neurological: She is alert and oriented to person, place, and time.  Skin: Skin  is warm and dry.  Psychiatric: She has a normal mood and affect. Her behavior is normal.   Results for orders placed or performed during the hospital encounter of 05/24/17  ABO/Rh  Result Value Ref Range   ABO/RH(D)      O NEG Performed at [facility], [address], [place], [place] [address]   Surgical pathology  Result Value Ref Range   SURGICAL PATHOLOGY      Surgical Pathology CASE: [id number] PATIENT: Chisa Lacaze Surgical Pathology Report     SPECIMEN SUBMITTED: A. Ectocervical LEEP B. Endocervical LEEP C. Endocervical curettings, post LEEP D. Endometrial curettings  CLINICAL HISTORY: None provided  PRE-OPERATIVE DIAGNOSIS: Post menopausal bleeding  POST-OPERATIVE DIAGNOSIS: Same as pre-op     DIAGNOSIS: A.  ENDOCERVIX; LEEP: - SQUAMOUS METAPLASIA. - ACUTE AND CHRONIC CERVICITIS. - DEEPER LEVELS WERE EXAMINED.  B.  ENDOCERVIX; LEEP: - SQUAMOUS METAPLASIA. - CHRONIC CERVICITIS. - DEEPER LEVELS WERE EXAMINED.  C. ENDOCERVICAL CURETTINGS, POST LEEP; DILATATION AND CURETTAGE: - BENIGN CERVICAL TISSUE. - BENIGN ENDOMETRIAL GLANDS. - CAUTERY ARTIFACT PARTIALLY LIMITS INTERPRETATION.  D.  ENDOMETRIAL CURETTINGS; DILATATION AND CURETTAGE: - SCANT BENIGN ENDOMETRIAL TISSUE.   GROSS DESCRIPTION:  A. Labeled: ectocervical LEEP  Size: 2.5 x 1.7 x  0.6 cm with a 1 cm long slit like os  Ectocervix:  smooth pink-tan  Endocervix: soft red mucoid  Inking scheme: endocervical edge margin inked black and remaining margin blue, specimen unoriented  Block summary: 1-4-radially sectioned and submitted clockwise respectively  B. Labeled: endocervical LEEP  Size: 2.2 x 1.0 x 0.6 cm,  with a  1 cm slit like os  Ectocervix: none positively identified  Endocervix: soft pink-tan  Inking scheme: from contour what appears the endocervical margin inked black and remaining blue  Block summary: 1-4-radially sectioned and submitted clockwise  respectively  C. Labeled: post Leep endocervical curetting  Tissue fragment(s): multiple  Description: in formalin, mucoid and brown material  Entirely submitted in 1 cassette(s).  D. Labeled: endometrial curetting  Tissue fragment(s): multiple  Size: aggregate, 0.8 x 0.3 x 0.1 cm  Description:  in formalin, mucoid and brown material  Entirely submitted in 1 cassette(s).      Final Diagnosis performed by Delorse Lek, Artist Pais  Electronically signed 05/28/2017 10:00:18AM    The electronic signature indicates that the named Attending Pathologist has evaluated the specimen  Technical component performed at [facility], [address] [place], [place] [address] Lab: [phone] Dir: Rush Farmer, MD, MMM  Professional component performed at [facility], [facility], [place], [place], [place] [address] Lab: [phone] Dir: Dellia Nims. Reuel Derby, MD        Assessment & Plan:  1. Acute otitis media, unspecified otitis media type  - amoxicillin (AMOXIL) 875 MG tablet; Take 1 tablet (875 mg total) by mouth 2 (two) times daily for 10 days.  Dispense: 20 tablet; Refill: 0    Follow up plan: Return if symptoms worsen or fail to improve.  Carles Collet, PA-C [facility] 12/22/2017, 12:57 PM

## 2017-12-31 ENCOUNTER — Ambulatory Visit (INDEPENDENT_AMBULATORY_CARE_PROVIDER_SITE_OTHER): Payer: BLUE CROSS/BLUE SHIELD

## 2017-12-31 DIAGNOSIS — Z23 Encounter for immunization: Secondary | ICD-10-CM

## 2017-12-31 NOTE — Patient Instructions (Signed)

## 2018-01-12 ENCOUNTER — Ambulatory Visit (INDEPENDENT_AMBULATORY_CARE_PROVIDER_SITE_OTHER): Payer: BLUE CROSS/BLUE SHIELD

## 2018-01-12 DIAGNOSIS — I493 Ventricular premature depolarization: Secondary | ICD-10-CM | POA: Diagnosis not present

## 2018-01-14 ENCOUNTER — Other Ambulatory Visit: Payer: Self-pay | Admitting: Cardiovascular Disease

## 2018-01-17 ENCOUNTER — Ambulatory Visit
Admission: RE | Admit: 2018-01-17 | Discharge: 2018-01-17 | Disposition: A | Discharge status: Home / Self Care | Payer: BLUE CROSS/BLUE SHIELD | Source: Ambulatory Visit | Attending: Cardiovascular Disease | Admitting: Cardiovascular Disease

## 2018-01-17 DIAGNOSIS — I493 Ventricular premature depolarization: Secondary | ICD-10-CM | POA: Insufficient documentation

## 2018-02-27 ENCOUNTER — Other Ambulatory Visit: Payer: Self-pay

## 2018-02-27 ENCOUNTER — Encounter: Payer: Self-pay | Admitting: Emergency Medicine

## 2018-02-27 DIAGNOSIS — I129 Hypertensive chronic kidney disease with stage 1 through stage 4 chronic kidney disease, or unspecified chronic kidney disease: Secondary | ICD-10-CM | POA: Diagnosis not present

## 2018-02-27 DIAGNOSIS — R1031 Right lower quadrant pain: Secondary | ICD-10-CM | POA: Diagnosis not present

## 2018-02-27 DIAGNOSIS — Z79899 Other long term (current) drug therapy: Secondary | ICD-10-CM | POA: Diagnosis not present

## 2018-02-27 DIAGNOSIS — Z87891 Personal history of nicotine dependence: Secondary | ICD-10-CM | POA: Insufficient documentation

## 2018-02-27 DIAGNOSIS — K529 Noninfective gastroenteritis and colitis, unspecified: Secondary | ICD-10-CM | POA: Diagnosis not present

## 2018-02-27 DIAGNOSIS — N181 Chronic kidney disease, stage 1: Secondary | ICD-10-CM | POA: Diagnosis not present

## 2018-02-27 DIAGNOSIS — R112 Nausea with vomiting, unspecified: Secondary | ICD-10-CM | POA: Insufficient documentation

## 2018-02-27 DIAGNOSIS — R197 Diarrhea, unspecified: Secondary | ICD-10-CM | POA: Diagnosis not present

## 2018-02-27 LAB — URINALYSIS, COMPLETE (UACMP) WITH MICROSCOPIC
BILIRUBIN URINE: NEGATIVE
Glucose, UA: NEGATIVE mg/dL
HGB URINE DIPSTICK: NEGATIVE
Ketones, ur: NEGATIVE mg/dL
LEUKOCYTES UA: NEGATIVE
Nitrite: NEGATIVE
PH: 5 (ref 5.0–8.0)
Protein, ur: NEGATIVE mg/dL
Specific Gravity, Urine: 1.025 (ref 1.005–1.030)

## 2018-02-27 LAB — COMPREHENSIVE METABOLIC PANEL
ALK PHOS: 73 U/L (ref 38–126)
ALT: 22 U/L (ref 0–44)
ANION GAP: 8 (ref 5–15)
AST: 20 U/L (ref 15–41)
Albumin: 4.5 g/dL (ref 3.5–5.0)
BUN: 18 mg/dL (ref 6–20)
CALCIUM: 9.1 mg/dL (ref 8.9–10.3)
CO2: 28 mmol/L (ref 22–32)
Chloride: 106 mmol/L (ref 98–111)
Creatinine, Ser: 0.51 mg/dL (ref 0.44–1.00)
GFR calc non Af Amer: >60 mL/min (ref >60)
Glucose, Bld: 91 mg/dL (ref 70–99)
POTASSIUM: 3.8 mmol/L (ref 3.5–5.1)
SODIUM: 142 mmol/L (ref 135–145)
Total Bilirubin: 0.3 mg/dL (ref 0.3–1.2)
Total Protein: 7.8 g/dL (ref 6.5–8.1)

## 2018-02-27 LAB — CBC
HCT: 39.6 % (ref 36.0–46.0)
HEMOGLOBIN: 13.1 g/dL (ref 12.0–15.0)
MCH: 29 pg (ref 26.0–34.0)
MCHC: 33.1 g/dL (ref 30.0–36.0)
MCV: 87.6 fL (ref 80.0–100.0)
NRBC: 0 % (ref 0.0–0.2)
PLATELETS: 370 10*3/uL (ref 150–400)
RBC: 4.52 MIL/uL (ref 3.87–5.11)
RDW: 12.9 % (ref 11.5–15.5)
WBC: 7 10*3/uL (ref 4.0–10.5)

## 2018-02-27 LAB — LIPASE, BLOOD: LIPASE: 34 U/L (ref 11–51)

## 2018-02-27 NOTE — ED Triage Notes (Signed)
Patient ambulatory to triage with complaints of pain in right lower quad since noon.  Pt c/o "violent dumping syndrome" and tapping, annoying feeling in right lower side.    Pt hx of gastric bypass, and fibro myalgia   Pt reports vomiting x2 today.Speaking in complete coherent sentences. No acute breathing distress noted.

## 2018-02-28 ENCOUNTER — Encounter: Payer: Self-pay | Admitting: Radiology

## 2018-02-28 ENCOUNTER — Emergency Department
Admission: EM | Admit: 2018-02-28 | Discharge: 2018-02-28 | Disposition: A | Discharge status: Home / Self Care | Payer: BLUE CROSS/BLUE SHIELD | Attending: Emergency Medicine | Admitting: Emergency Medicine

## 2018-02-28 ENCOUNTER — Emergency Department: Payer: BLUE CROSS/BLUE SHIELD

## 2018-02-28 DIAGNOSIS — R197 Diarrhea, unspecified: Secondary | ICD-10-CM

## 2018-02-28 DIAGNOSIS — R112 Nausea with vomiting, unspecified: Secondary | ICD-10-CM

## 2018-02-28 DIAGNOSIS — R1031 Right lower quadrant pain: Secondary | ICD-10-CM | POA: Diagnosis not present

## 2018-02-28 DIAGNOSIS — K529 Noninfective gastroenteritis and colitis, unspecified: Secondary | ICD-10-CM

## 2018-02-28 LAB — PREGNANCY, URINE: Preg Test, Ur: NEGATIVE

## 2018-02-28 MED ORDER — ONDANSETRON HCL 4 MG/2ML IJ SOLN
4.0000 mg | Freq: Once | INTRAMUSCULAR | Status: AC
  Administered 2018-02-28: 4 mg via INTRAVENOUS
  Filled 2018-02-28: qty 2

## 2018-02-28 MED ORDER — ACETAMINOPHEN 325 MG PO TABS
650.0000 mg | ORAL_TABLET | Freq: Four times a day (QID) | ORAL | Status: DC | PRN
  Administered 2018-02-28: 650 mg via ORAL
  Filled 2018-02-28: qty 2

## 2018-02-28 MED ORDER — IOPAMIDOL (ISOVUE-300) INJECTION 61%
30.0000 mL | Freq: Once | INTRAVENOUS | Status: AC
  Administered 2018-02-28: 30 mL via ORAL

## 2018-02-28 MED ORDER — IOHEXOL 300 MG/ML  SOLN
125.0000 mL | Freq: Once | INTRAMUSCULAR | Status: AC | PRN
  Administered 2018-02-28: 150 mL via INTRAVENOUS

## 2018-02-28 MED ORDER — ONDANSETRON 4 MG PO TBDP: 4.0000 mg | ORAL_TABLET | Freq: Four times a day (QID) | ORAL | 0 refills | Status: DC | PRN

## 2018-02-28 MED ORDER — SODIUM CHLORIDE 0.9 % IV BOLUS
500.0000 mL | Freq: Once | INTRAVENOUS | Status: AC
  Administered 2018-02-28: 500 mL via INTRAVENOUS

## 2018-02-28 NOTE — ED Provider Notes (Signed)
[facility] Emergency Department Provider Note   ____________________________________________   First MD Initiated Contact with Patient 02/28/18 0037     (approximate)  I have reviewed the triage vital signs and the nursing notes.   HISTORY  Chief Complaint Abdominal Pain    HPI Cathy Mitchell is a 52 y.o. female with a history of previous gastric bypass, hypertension, fibromyalgia  Patient reports that the day before Thanksgiving she started to have occasional loose stools, and over the last couple of days she is had tells me 2 episodes of vomiting up yellow acid he content, multiple loose watery stools yesterday.  No black or bloody stools or vomit.  No fevers or chills.  Reports it feels like when she had "dumping syndrome" after her gastric bypass and this very watery with associated nausea and vomiting.  No trips or travel.  No sick contacts are noted.   Past Medical History:  Diagnosis Date  . Anxiety   . Arthritis   . Fibromyalgia   . Hypertension    no longer has hypertension  . Neuromuscular disorder ([place])    L-sided tremors  . Sleep apnea    does not use C-PAP    Patient Active Problem List   Diagnosis Date Noted  . S/P D&C (status post dilation and curettage) 05/24/2017  . S/P LEEP 05/24/2017  . Postmenopausal vaginal bleeding 04/30/2017  . Obesity (BMI 35.0-39.9 without comorbidity) 03/22/2017  . Occipital headaches (Bilateral) (L>R) 08/27/2016  . Cervicogenic headache (Bilateral) (L>R) 08/27/2016  . Tricompartment osteoarthritis of knee (Right) 08/04/2016  . Osteoarthritis of knee (Right) 08/04/2016  . Osteoarthritis 08/04/2016  . Long term (current) use of opiate analgesic 08/04/2016  . Long term prescription opiate use 08/04/2016  . Opiate use 08/04/2016  . Chronic knee pain (Location of Primary Source of Pain) (Bilateral) (R>L) 08/04/2016  . Osteoarthritis of knee (Bilateral) (R>L) 08/04/2016  . Chronic neck pain (Location  of Secondary source of pain) (Bilateral) 08/04/2016  . Osteoarthritis of hip (Right) 08/04/2016  . Eczema of right hand 07/31/2016  . Chronic knee pain (Right) 07/17/2016  . Wheat allergy 02/12/2016  . Chronic hip pain (Location of Tertiary source of pain) (Right) 02/12/2016  . Pain management 02/12/2016  . Rosacea 04/29/2015  . Numbness 12/03/2014  . Chronic kidney disease, stage 1, normal or increased GFR 10/15/2014  . Sleep apnea 09/11/2014  . Extreme obesity 09/11/2014  . Primary generalized hypertrophic osteoarthrosis 09/10/2014  . Obstructive sleep apnea 09/10/2014  . Chronic pain syndrome 09/10/2014  . Irritable bowel syndrome 09/10/2014  . Tobacco abuse 09/10/2014  . Acute anxiety 09/10/2014  . Fibromyalgia 09/10/2014    Past Surgical History:  Procedure Laterality Date  . DILATION AND CURETTAGE OF UTERUS    . GANGLION CYST EXCISION Bilateral   . GANGLION CYST EXCISION Right 10/08/2015   Procedure: REMOVAL GANGLION OF WRIST;  Surgeon: Hessie Knows, MD;  Location: [facility] ORS;  Service: Orthopedics;  Laterality: Right;  . GASTRIC BYPASS    . HAND SURGERY Bilateral Resection  . HYSTEROSCOPY W/D&C N/A 05/24/2017   Procedure: DILATATION AND CURETTAGE /HYSTEROSCOPY;  Surgeon: Defrancesco, Alanda Slim, MD;  Location: [facility] ORS;  Service: Gynecology;  Laterality: N/A;  . LEEP N/A 05/24/2017   Procedure: LOOP ELECTROSURGICAL EXCISION PROCEDURE (LEEP);  Surgeon: Brayton Mars, MD;  Location: [facility] ORS;  Service: Gynecology;  Laterality: N/A;  . TUBAL LIGATION      Prior to Admission medications   Medication Sig Start Date End Date Taking?  Authorizing Provider  acetaminophen (TYLENOL) 500 MG tablet Take 1,500 mg by mouth daily as needed for mild pain.    [provider]  Ascorbic Acid (VITAMIN C) 1000 MG tablet Take 1,000 mg by mouth daily.    [provider]  azelastine (OPTIVAR) 0.05 % ophthalmic solution Place 1 drop into both eyes 2 (two) times daily. Patient  taking differently: Place 1 drop into both eyes 2 (two) times daily as needed (allergies).  03/22/17   Kathrine Haddock, NP  cetirizine (ZYRTEC) 10 MG tablet Take 1 tablet (10 mg total) by mouth daily. Patient taking differently: Take 10 mg by mouth daily as needed for allergies.  03/11/17   Volney American, PA-C  cholecalciferol (VITAMIN D) 1000 units tablet Take 1,000 Units by mouth daily.    [provider]  Cholecalciferol 10000 units CAPS Take 1,000 Units by mouth daily.    [provider]  cyanocobalamin 1000 MCG tablet Take 1,000 mcg by mouth daily.    [provider]  diclofenac sodium (VOLTAREN) 1 % GEL Apply 2 g topically daily as needed (pain).    [provider]  diltiazem (CARDIZEM CD) 120 MG 24 hr capsule Take 1 capsule (120 mg total) by mouth daily. 12/03/17   Wellington Hampshire, MD  fluticasone (FLONASE) 50 MCG/ACT nasal spray Place 2 sprays into both nostrils daily as needed for allergies. 09/17/17   Johnson, Megan P, DO  glucosamine-chondroitin 500-400 MG tablet Take 1 tablet by mouth daily.    [provider]  Multiple Minerals-Vitamins (CAL-MAG-ZINC-D PO) Take 1 tablet by mouth daily.    [provider]  Multiple Vitamins-Minerals (MULTIPLE VITAMINS/WOMENS) tablet Take 2 tablets by mouth daily. women's 50+    [provider]  ondansetron (ZOFRAN ODT) 4 MG disintegrating tablet Take 1 tablet (4 mg total) by mouth every 6 (six) hours as needed for nausea or vomiting. 02/28/18   Delman Kitten, MD  Potassium 99 MG TABS Take 99 mg by mouth daily.    [provider]  Turmeric 500 MG TABS Take 1 tablet by mouth daily.    [provider]  Zinc 50 MG TABS Take 1 tablet by mouth daily.    [provider]    Allergies Oxycodone-acetaminophen; Nsaids; Other; and Percocet [oxycodone-acetaminophen]  Family History  Problem Relation Age of Onset  . COPD Father   . Thyroid disease Daughter   . Eczema  Son   . Heart disease Maternal Grandfather   . Diabetes Paternal Grandfather   . Heart disease Paternal Grandfather     Social History Social History   Tobacco Use  . Smoking status: Former Smoker    Packs/day: 0.50    Years: 29.00    Pack years: 14.50    Types: Cigarettes    Last attempt to quit: 01/29/2015    Years since quitting: 3.0  . Smokeless tobacco: Never Used  Substance Use Topics  . Alcohol use: No    Alcohol/week: 0.0 standard drinks  . Drug use: No    Review of Systems Constitutional: No fever/chills but some fatigue over the last 2 days Eyes: No visual changes. ENT: No sore throat. Cardiovascular: Denies chest pain. Respiratory: Denies shortness of breath. Gastrointestinal: Persistent mild discomfort that feels like a poking in her right lower abdomen for 2 days.  Also reports multiple loose stools that have improved after Imodium today last bowel movement this morning.  Has vomited twice over the last 24 hours.  Able to hold  some fluids down however in the interim, currently denies feeling she is going to vomit but just slight nausea. Genitourinary: Negative for dysuria.  Reports she has been through menopause.  No pain or burning with urination.  No vaginal discharge or odor. Musculoskeletal: Negative for back pain. Skin: Negative for rash. Neurological: Negative for headaches, areas of focal weakness or numbness.    ____________________________________________   PHYSICAL EXAM:  VITAL SIGNS: ED Triage Vitals  Enc Vitals Group     BP 02/27/18 2226 (!) 157/96     Pulse Rate 02/27/18 2226 85     Resp 02/27/18 2226 18     Temp 02/27/18 2226 98 F (36.7 C)     Temp Source 02/27/18 2226 Oral     SpO2 02/27/18 2226 97 %     Weight 02/27/18 2227 260 lb (117.9 kg)     Height 02/27/18 2227 5\' 9"  (1.753 m)     Head Circumference --      Peak Flow --      Pain Score 02/27/18 2226 2     Pain Loc --      Pain Edu? --      Excl. in [place]? --      Constitutional: Alert and oriented. Well appearing and in no acute distress. Eyes: Conjunctivae are normal. Head: Atraumatic. Nose: No congestion/rhinnorhea. Mouth/Throat: Mucous membranes are moist. Neck: No stridor.  Cardiovascular: Normal rate, regular rhythm. Grossly normal heart sounds.  Good peripheral circulation. Respiratory: Normal respiratory effort.  No retractions. Lungs CTAB. Gastrointestinal: Soft and nontender except for some mild discomfort in the right lower quadrant, approximately in the region of McBurney's point but no associated rebound or guarding.  No peritonitis.  Negative psoas.  No rasping.. No distention. Musculoskeletal: No lower extremity tenderness nor edema. Neurologic:  Normal speech and language. No gross focal neurologic deficits are appreciated.  Skin:  Skin is warm, dry and intact. No rash noted. Psychiatric: Mood and affect are normal. Speech and behavior are normal.  ____________________________________________   LABS (all labs ordered are listed, but only abnormal results are displayed)  Labs Reviewed  URINALYSIS, COMPLETE (UACMP) WITH MICROSCOPIC - Abnormal; Notable for the following components:      Result Value   Color, Urine YELLOW (*)    APPearance HAZY (*)    Bacteria, UA RARE (*)    All other components within normal limits  LIPASE, BLOOD  COMPREHENSIVE METABOLIC PANEL  CBC  PREGNANCY, URINE   ____________________________________________  EKG  Patient denies any cardiac or pulmonary symptoms ____________________________________________  RADIOLOGY  CT abdomen pelvis unremarkable for acute.  Appendix is not seen but no other findings to suggest appendicitis are noted on the CAT scan either. ____________________________________________   PROCEDURES  Procedure(s) performed: None  Procedures  Critical Care performed: No  ____________________________________________   INITIAL IMPRESSION / ASSESSMENT AND PLAN / ED  COURSE  Pertinent labs & imaging results that were available during my care of the patient were reviewed by me and considered in my medical decision making (see chart for details).   Differential diagnosis includes but is not limited to, abdominal perforation, aortic dissection, cholecystitis, appendicitis, diverticulitis, colitis, esophagitis/gastritis, kidney stone, pyelonephritis, urinary tract infection, aortic aneurysm. All are considered in decision and treatment plan. Based upon the patient's presentation and risk factors, will proceed with CT scan to further evaluate given the focality of discomfort in the right lower quadrant on my pretest probability for an acute intra-abdominal infection such as appendicitis, diverticulitis is low.  Suspect this is likely a self-limited gastrointestinal illness or potentially related to her gastric bypass and previous dumping type illness as she does report dietary change the day prior to Thanksgiving as well.  Patient not currently wishing for any thing for pain, reports is a very mild discomfort.  Nontoxic and well-appearing.  We will proceed with CT scan, IV antiemetic and hydration.  Patient agreement.     ----------------------------------------- 3:52 AM on 02/28/2018 -----------------------------------------  Patient is resting comfortably.  Reviewed her CAT scan findings with her.  On reexam, she reports she has mild tenderness still in the same place, but does not wish for any medication.  She is well-appearing.  Afebrile with normal white count, no evidence of peritonitis by examination.  I highly doubt this would represent appendicitis especially with her reassuring CT.  No evidence of acute intra-abdominal bacterial infection.  All diarrhea and vomiting are resolved and she is tolerated by mouth well without any difficulty here.  Discussed with patient, will discharge with Zofran and careful return precautions discussed with the plan for  follow-up with primary care.  Patient agreement.  I suspect this is likely self-limited gastrointestinal illness, possibly could be related also to dietary changes given her history of gastric bypass and she reports change in her diet around Thanksgiving as well.  Patient comfortable with plan for discharge and return precautions.  Return precautions and treatment recommendations and follow-up discussed with the patient who is agreeable with the plan.   ____________________________________________   FINAL CLINICAL IMPRESSION(S) / ED DIAGNOSES  Final diagnoses:  Nausea vomiting and diarrhea  Enteritis        Note:  This document was prepared using Dragon voice recognition software and may include unintentional dictation errors       Delman Kitten, MD 02/28/18 [phone]

## 2018-02-28 NOTE — Discharge Instructions (Signed)
You were seen in the emergency room for abdominal pain. It is important that you follow up closely with your primary care doctor in the next couple of days, especially if you are not getting better.  Please return to the emergency room right away if you are to develop a fever, severe nausea, your pain becomes severe or worsens, you are unable to keep food down, begin vomiting any dark or bloody fluid, you develop any dark or bloody stools, feel dehydrated, or other new concerns or symptoms arise.

## 2018-03-07 ENCOUNTER — Other Ambulatory Visit: Payer: Self-pay

## 2018-03-07 ENCOUNTER — Encounter: Payer: Self-pay | Admitting: Emergency Medicine

## 2018-03-07 DIAGNOSIS — N3090 Cystitis, unspecified without hematuria: Secondary | ICD-10-CM | POA: Diagnosis not present

## 2018-03-07 DIAGNOSIS — I10 Essential (primary) hypertension: Secondary | ICD-10-CM | POA: Diagnosis not present

## 2018-03-07 DIAGNOSIS — N390 Urinary tract infection, site not specified: Secondary | ICD-10-CM | POA: Diagnosis not present

## 2018-03-07 DIAGNOSIS — Z87891 Personal history of nicotine dependence: Secondary | ICD-10-CM | POA: Insufficient documentation

## 2018-03-07 DIAGNOSIS — R350 Frequency of micturition: Secondary | ICD-10-CM | POA: Diagnosis not present

## 2018-03-07 NOTE — ED Triage Notes (Signed)
Patient ambulatory to triage with steady gait, without difficulty or distress noted; pt reports urinary frequency and "my bladder sling is yanking on something"

## 2018-03-08 ENCOUNTER — Emergency Department
Admission: EM | Admit: 2018-03-08 | Discharge: 2018-03-08 | Disposition: A | Discharge status: Home / Self Care | Payer: BLUE CROSS/BLUE SHIELD | Attending: Emergency Medicine | Admitting: Emergency Medicine

## 2018-03-08 DIAGNOSIS — N3090 Cystitis, unspecified without hematuria: Secondary | ICD-10-CM

## 2018-03-08 LAB — CBC WITH DIFFERENTIAL/PLATELET
Abs Immature Granulocytes: 0.01 10*3/uL (ref 0.00–0.07)
Basophils Absolute: 0.1 10*3/uL (ref 0.0–0.1)
Basophils Relative: 2 %
Eosinophils Absolute: 0.3 10*3/uL (ref 0.0–0.5)
Eosinophils Relative: 4 %
HCT: 39.5 % (ref 36.0–46.0)
Hemoglobin: 12.7 g/dL (ref 12.0–15.0)
Immature Granulocytes: 0 %
Lymphocytes Relative: 38 %
Lymphs Abs: 2.8 10*3/uL (ref 0.7–4.0)
MCH: 28.5 pg (ref 26.0–34.0)
MCHC: 32.2 g/dL (ref 30.0–36.0)
MCV: 88.6 fL (ref 80.0–100.0)
Monocytes Absolute: 0.6 10*3/uL (ref 0.1–1.0)
Monocytes Relative: 8 %
Neutro Abs: 3.6 10*3/uL (ref 1.7–7.7)
Neutrophils Relative %: 48 %
Platelets: 365 10*3/uL (ref 150–400)
RBC: 4.46 MIL/uL (ref 3.87–5.11)
RDW: 12.9 % (ref 11.5–15.5)
WBC: 7.5 10*3/uL (ref 4.0–10.5)
nRBC: 0 % (ref 0.0–0.2)

## 2018-03-08 LAB — COMPREHENSIVE METABOLIC PANEL
ALK PHOS: 72 U/L (ref 38–126)
ALT: 22 U/L (ref 0–44)
ANION GAP: 7 (ref 5–15)
AST: 17 U/L (ref 15–41)
Albumin: 4.1 g/dL (ref 3.5–5.0)
BUN: 15 mg/dL (ref 6–20)
CALCIUM: 8.9 mg/dL (ref 8.9–10.3)
CO2: 28 mmol/L (ref 22–32)
Chloride: 105 mmol/L (ref 98–111)
Creatinine, Ser: 0.53 mg/dL (ref 0.44–1.00)
GFR calc Af Amer: >60 mL/min (ref >60)
GFR calc non Af Amer: >60 mL/min (ref >60)
Glucose, Bld: 97 mg/dL (ref 70–99)
Potassium: 3.7 mmol/L (ref 3.5–5.1)
SODIUM: 140 mmol/L (ref 135–145)
Total Bilirubin: 0.5 mg/dL (ref 0.3–1.2)
Total Protein: 7.4 g/dL (ref 6.5–8.1)

## 2018-03-08 LAB — URINALYSIS, COMPLETE (UACMP) WITH MICROSCOPIC
Bilirubin Urine: NEGATIVE
Glucose, UA: NEGATIVE mg/dL
HGB URINE DIPSTICK: NEGATIVE
Ketones, ur: NEGATIVE mg/dL
Leukocytes, UA: NEGATIVE
Nitrite: NEGATIVE
Protein, ur: NEGATIVE mg/dL
Specific Gravity, Urine: 1.021 (ref 1.005–1.030)
pH: 5 (ref 5.0–8.0)

## 2018-03-08 MED ORDER — NITROFURANTOIN MONOHYD MACRO 100 MG PO CAPS: 100.0000 mg | ORAL_CAPSULE | Freq: Two times a day (BID) | ORAL | 0 refills | Status: DC

## 2018-03-08 MED ORDER — PHENAZOPYRIDINE HCL 200 MG PO TABS: 200.0000 mg | ORAL_TABLET | Freq: Once | ORAL | Status: DC

## 2018-03-08 MED ORDER — NITROFURANTOIN MONOHYD MACRO 100 MG PO CAPS: 100.0000 mg | ORAL_CAPSULE | Freq: Once | ORAL | Status: DC

## 2018-03-08 MED ORDER — PHENAZOPYRIDINE HCL 200 MG PO TABS: 200.0000 mg | ORAL_TABLET | Freq: Three times a day (TID) | ORAL | 0 refills | Status: DC | PRN

## 2018-03-08 NOTE — ED Notes (Signed)
Went in to see patient and she was not in the room. PT left without RN being able to assess her and give medications. MD did speak to patient but left without meds.

## 2018-03-08 NOTE — ED Provider Notes (Signed)
[facility] Emergency Department Provider Note       Time seen: ----------------------------------------- 7:43 AM on 03/08/2018 -----------------------------------------   I have reviewed the triage vital signs and the nursing notes.  HISTORY   Chief Complaint Urinary Frequency   HPI Cathy Mitchell is a 52 y.o. female with a history of anxiety, arthritis, fibromyalgia, hypertension, sleep apnea who presents to the ED for urinary frequency.  She is concerned there may be a problem with her bladder sling.  She has thought that the bladder sling may have ruptured.  She does describe dysuria, had similar symptoms recently.  Past Medical History:  Diagnosis Date  . Anxiety   . Arthritis   . Fibromyalgia   . Hypertension    no longer has hypertension  . Neuromuscular disorder ([place])    L-sided tremors  . Sleep apnea    does not use C-PAP    Patient Active Problem List   Diagnosis Date Noted  . S/P D&C (status post dilation and curettage) 05/24/2017  . S/P LEEP 05/24/2017  . Postmenopausal vaginal bleeding 04/30/2017  . Obesity (BMI 35.0-39.9 without comorbidity) 03/22/2017  . Occipital headaches (Bilateral) (L>R) 08/27/2016  . Cervicogenic headache (Bilateral) (L>R) 08/27/2016  . Tricompartment osteoarthritis of knee (Right) 08/04/2016  . Osteoarthritis of knee (Right) 08/04/2016  . Osteoarthritis 08/04/2016  . Long term (current) use of opiate analgesic 08/04/2016  . Long term prescription opiate use 08/04/2016  . Opiate use 08/04/2016  . Chronic knee pain (Location of Primary Source of Pain) (Bilateral) (R>L) 08/04/2016  . Osteoarthritis of knee (Bilateral) (R>L) 08/04/2016  . Chronic neck pain (Location of Secondary source of pain) (Bilateral) 08/04/2016  . Osteoarthritis of hip (Right) 08/04/2016  . Eczema of right hand 07/31/2016  . Chronic knee pain (Right) 07/17/2016  . Wheat allergy 02/12/2016  . Chronic hip pain (Location of Tertiary  source of pain) (Right) 02/12/2016  . Pain management 02/12/2016  . Rosacea 04/29/2015  . Numbness 12/03/2014  . Chronic kidney disease, stage 1, normal or increased GFR 10/15/2014  . Sleep apnea 09/11/2014  . Extreme obesity 09/11/2014  . Primary generalized hypertrophic osteoarthrosis 09/10/2014  . Obstructive sleep apnea 09/10/2014  . Chronic pain syndrome 09/10/2014  . Irritable bowel syndrome 09/10/2014  . Tobacco abuse 09/10/2014  . Acute anxiety 09/10/2014  . Fibromyalgia 09/10/2014    Past Surgical History:  Procedure Laterality Date  . DILATION AND CURETTAGE OF UTERUS    . GANGLION CYST EXCISION Bilateral   . GANGLION CYST EXCISION Right 10/08/2015   Procedure: REMOVAL GANGLION OF WRIST;  Surgeon: Hessie Knows, MD;  Location: [facility] ORS;  Service: Orthopedics;  Laterality: Right;  . GASTRIC BYPASS    . HAND SURGERY Bilateral Resection  . HYSTEROSCOPY W/D&C N/A 05/24/2017   Procedure: DILATATION AND CURETTAGE /HYSTEROSCOPY;  Surgeon: Defrancesco, Alanda Slim, MD;  Location: [facility] ORS;  Service: Gynecology;  Laterality: N/A;  . LEEP N/A 05/24/2017   Procedure: LOOP ELECTROSURGICAL EXCISION PROCEDURE (LEEP);  Surgeon: Brayton Mars, MD;  Location: [facility] ORS;  Service: Gynecology;  Laterality: N/A;  . TUBAL LIGATION      Allergies Oxycodone-acetaminophen; Nsaids; Other; and Percocet [oxycodone-acetaminophen]  Social History Social History   Tobacco Use  . Smoking status: Former Smoker    Packs/day: 0.50    Years: 29.00    Pack years: 14.50    Types: Cigarettes    Last attempt to quit: 01/29/2015    Years since quitting: 3.1  . Smokeless tobacco: Never Used  Substance Use Topics  . Alcohol use: No    Alcohol/week: 0.0 standard drinks  . Drug use: No   Review of Systems Constitutional: Negative for fever. Cardiovascular: Negative for chest pain. Respiratory: Negative for shortness of breath. Gastrointestinal: Positive for abdominal pain Genitourinary: Positive  for dysuria, polyuria Musculoskeletal: Negative for back pain. Skin: Negative for rash. Neurological: Negative for headaches, focal weakness or numbness.  All systems negative/normal/unremarkable except as stated in the HPI  ____________________________________________   PHYSICAL EXAM:  VITAL SIGNS: ED Triage Vitals  Enc Vitals Group     BP 03/08/18 0033 131/87     Pulse Rate 03/08/18 0033 64     Resp 03/08/18 0033 16     Temp 03/08/18 0033 98.1 F (36.7 C)     Temp Source 03/08/18 0033 Oral     SpO2 03/08/18 0033 100 %     Weight 03/07/18 2358 259 lb (117.5 kg)     Height 03/07/18 2358 5\' 9"  (1.753 m)     Head Circumference --      Peak Flow --      Pain Score 03/07/18 2358 10     Pain Loc --      Pain Edu? --      Excl. in [place]? --    Constitutional: Alert and oriented.  Anxious, no distress ENT   Head: Normocephalic and atraumatic.   Nose: No congestion/rhinnorhea.   Mouth/Throat: Mucous membranes are moist.   Neck: No stridor. Cardiovascular: Normal rate, regular rhythm. No murmurs, rubs, or gallops. Respiratory: Normal respiratory effort without tachypnea nor retractions. Breath sounds are clear and equal bilaterally. No wheezes/rales/rhonchi. Gastrointestinal: Nonfocal tenderness, normal bowel sounds Musculoskeletal: Nontender with normal range of motion in extremities. No lower extremity tenderness nor edema. Neurologic:  Normal speech and language. No gross focal neurologic deficits are appreciated.  Skin:  Skin is warm, dry and intact. No rash noted. ____________________________________________  ED COURSE:  As part of my medical decision making, I reviewed the following data within the [place] History obtained from family if available, nursing notes, old chart and ekg, as well as notes from prior ED visits. Patient presented for urinary symptoms, we will assess with labs and reevaluate.    Procedures ____________________________________________   LABS (pertinent positives/negatives)  Labs Reviewed  URINALYSIS, COMPLETE (UACMP) WITH MICROSCOPIC - Abnormal; Notable for the following components:      Result Value   Color, Urine YELLOW (*)    APPearance HAZY (*)    Bacteria, UA RARE (*)    All other components within normal limits  CBC WITH DIFFERENTIAL/PLATELET  COMPREHENSIVE METABOLIC PANEL  ____________________________________________  DIFFERENTIAL DIAGNOSIS   UTI, chronic pain, pyelonephritis  FINAL ASSESSMENT AND PLAN  UTI   Plan: The patient had presented for urinary symptoms. Patient's labs did indicate a urinary tract infection.  Patient had been waiting for an extended period of time at the time of my evaluation.  I will E prescribe antibiotics and Pyridium.  She is cleared for outpatient follow-up.   Laurence Aly, MD   Note: This note was generated in part or whole with voice recognition software. Voice recognition is usually quite accurate but there are transcription errors that can and very often do occur. I apologize for any typographical errors that were not detected and corrected.     Earleen Newport, MD 03/08/18 [phone]

## 2018-03-09 LAB — LIPID PANEL
Cholesterol: 166 (ref 0–200)
HDL: 56 (ref 35–70)
LDL Cholesterol: 90
LDl/HDL Ratio: 3
Triglycerides: 101 (ref 40–160)

## 2018-03-09 LAB — CBC AND DIFFERENTIAL
HCT: 40 (ref 36–46)
HEMOGLOBIN: 13 (ref 12.0–16.0)
WBC: 4.7

## 2018-03-09 LAB — BASIC METABOLIC PANEL
BUN: 11 (ref 4–21)
Glucose: 86
Potassium: 4.5 (ref 3.4–5.3)
Sodium: 142 (ref 137–147)

## 2018-03-09 LAB — HEPATIC FUNCTION PANEL
ALT: 20 (ref 7–35)
AST: 13 (ref 13–35)
Alkaline Phosphatase: 91 (ref 25–125)
Bilirubin, Total: 0.3

## 2018-03-14 ENCOUNTER — Other Ambulatory Visit: Payer: Self-pay | Admitting: Nurse Practitioner

## 2018-03-14 ENCOUNTER — Ambulatory Visit: Payer: Self-pay | Admitting: *Deleted

## 2018-03-14 ENCOUNTER — Telehealth: Payer: Self-pay | Admitting: Unknown Physician Specialty

## 2018-03-14 DIAGNOSIS — Z1211 Encounter for screening for malignant neoplasm of colon: Secondary | ICD-10-CM

## 2018-03-14 NOTE — Progress Notes (Signed)
Patient called and is requesting colonoscopy, referral to GI for initial screening.  Order placed.  Will advise patient to schedule appointment with this provider for follow-up visit.

## 2018-03-14 NOTE — Telephone Encounter (Signed)
Patient had questions regarding orange urine while on pyridium and macrobid for uti. Answered questions. Signs reviewed to watch for after she stops the pyridium and when to call back. She currently has no UTI symptoms.

## 2018-03-14 NOTE — Telephone Encounter (Signed)
  Answer Assessment - Initial Assessment Questions 1. INFECTION: "What infection is the antibiotic being given for?"     uti 2. ANTIBIOTIC: "What antibiotic are you taking" "How many times per day?"     macrobid 3. DURATION: "When was the antibiotic started?"     03/08/18 4. MAIN CONCERN OR SYMPTOM:  "What is your main concern right now?"     Orange urine 5. BETTER-SAME-WORSE: "Are you getting better, staying the same, or getting worse compared to when you first started the antibiotics?" If getting worse, ask: "In what way?"     improving 6. FEVER: "Do you have a fever?" If so, ask: "What is your temperature, how was it measured, and when did it start?"     no 7. SYMPTOMS: "Are there any other symptoms you're concerned about?" If so, ask: "When did it start?"      8. FOLLOW-UP APPOINTMENT: "Do you have a follow-up appointment with your doctor?"  Protocols used: INFECTION ON ANTIBIOTIC FOLLOW-UP CALL-A-AH

## 2018-03-14 NOTE — Telephone Encounter (Signed)
Patient would like a referral to schedule a colonoscopy. Routing to PCP for referral to be placed.

## 2018-03-14 NOTE — Telephone Encounter (Signed)
Have entered GI referral order.  Would like patient to schedule appointment with me for follow-up as I have not met patient.  She may be due for physical.  Would have to look in chart.

## 2018-03-15 ENCOUNTER — Encounter: Payer: Self-pay | Admitting: Nurse Practitioner

## 2018-03-17 ENCOUNTER — Other Ambulatory Visit: Payer: Self-pay

## 2018-03-17 DIAGNOSIS — Z1211 Encounter for screening for malignant neoplasm of colon: Secondary | ICD-10-CM

## 2018-03-17 MED ORDER — NA SULFATE-K SULFATE-MG SULF 17.5-3.13-1.6 GM/177ML PO SOLN: 1.0000 | Freq: Once | ORAL | 0 refills | Status: AC

## 2018-03-18 ENCOUNTER — Encounter: Payer: Self-pay | Admitting: Anesthesiology

## 2018-03-21 NOTE — Discharge Instructions (Signed)
General Anesthesia, Adult, Care After  This sheet gives you information about how to care for yourself after your procedure. Your health care provider may also give you more specific instructions. If you have problems or questions, contact your health care provider.  What can I expect after the procedure?  After the procedure, the following side effects are common:  Pain or discomfort at the IV site.  Nausea.  Vomiting.  Sore throat.  Trouble concentrating.  Feeling cold or chills.  Weak or tired.  Sleepiness and fatigue.  Soreness and body aches. These side effects can affect parts of the body that were not involved in surgery.  Follow these instructions at home:    For at least 24 hours after the procedure:  Have a responsible adult stay with you. It is important to have someone help care for you until you are awake and alert.  Rest as needed.  Do not:  Participate in activities in which you could fall or become injured.  Drive.  Use heavy machinery.  Drink alcohol.  Take sleeping pills or medicines that cause drowsiness.  Make important decisions or sign legal documents.  Take care of children on your own.  Eating and drinking  Follow any instructions from your health care provider about eating or drinking restrictions.  When you feel hungry, start by eating small amounts of foods that are soft and easy to digest (bland), such as toast. Gradually return to your regular diet.  Drink enough fluid to keep your urine pale yellow.  If you vomit, rehydrate by drinking water, juice, or clear broth.  General instructions  If you have sleep apnea, surgery and certain medicines can increase your risk for breathing problems. Follow instructions from your health care provider about wearing your sleep device:  Anytime you are sleeping, including during daytime naps.  While taking prescription pain medicines, sleeping medicines, or medicines that make you drowsy.  Return to your normal activities as told by your health care  provider. Ask your health care provider what activities are safe for you.  Take over-the-counter and prescription medicines only as told by your health care provider.  If you smoke, do not smoke without supervision.  Keep all follow-up visits as told by your health care provider. This is important.  Contact a health care provider if:  You have nausea or vomiting that does not get better with medicine.  You cannot eat or drink without vomiting.  You have pain that does not get better with medicine.  You are unable to pass urine.  You develop a skin rash.  You have a fever.  You have redness around your IV site that gets worse.  Get help right away if:  You have difficulty breathing.  You have chest pain.  You have blood in your urine or stool, or you vomit blood.  Summary  After the procedure, it is common to have a sore throat or nausea. It is also common to feel tired.  Have a responsible adult stay with you for the first 24 hours after general anesthesia. It is important to have someone help care for you until you are awake and alert.  When you feel hungry, start by eating small amounts of foods that are soft and easy to digest (bland), such as toast. Gradually return to your regular diet.  Drink enough fluid to keep your urine pale yellow.  Return to your normal activities as told by your health care provider. Ask your health care   provider what activities are safe for you.  This information is not intended to replace advice given to you by your health care provider. Make sure you discuss any questions you have with your health care provider.  Document Released: 06/22/2000 Document Revised: 10/30/2016 Document Reviewed: 10/30/2016  Elsevier Interactive Patient Education  2019 Elsevier Inc.

## 2018-03-28 ENCOUNTER — Telehealth: Payer: Self-pay | Admitting: Gastroenterology

## 2018-03-28 ENCOUNTER — Ambulatory Visit
Admission: RE | Admit: 2018-03-28 | Payer: BLUE CROSS/BLUE SHIELD | Source: Home / Self Care | Admitting: Gastroenterology

## 2018-03-28 ENCOUNTER — Ambulatory Visit (INDEPENDENT_AMBULATORY_CARE_PROVIDER_SITE_OTHER): Payer: BLUE CROSS/BLUE SHIELD | Admitting: Nurse Practitioner

## 2018-03-28 ENCOUNTER — Encounter: Payer: Self-pay | Admitting: Nurse Practitioner

## 2018-03-28 VITALS — BP 117/81 | HR 76 | Temp 98.1°F | Ht 69.0 in

## 2018-03-28 DIAGNOSIS — N3000 Acute cystitis without hematuria: Secondary | ICD-10-CM

## 2018-03-28 DIAGNOSIS — H6505 Acute serous otitis media, recurrent, left ear: Secondary | ICD-10-CM | POA: Diagnosis not present

## 2018-03-28 DIAGNOSIS — H6690 Otitis media, unspecified, unspecified ear: Secondary | ICD-10-CM | POA: Insufficient documentation

## 2018-03-28 DIAGNOSIS — Z1211 Encounter for screening for malignant neoplasm of colon: Secondary | ICD-10-CM

## 2018-03-28 LAB — UA/M W/RFLX CULTURE, ROUTINE
Bilirubin, UA: NEGATIVE
Glucose, UA: NEGATIVE
Ketones, UA: NEGATIVE
Leukocytes, UA: NEGATIVE
Nitrite, UA: NEGATIVE
PROTEIN UA: NEGATIVE
RBC, UA: NEGATIVE
Specific Gravity, UA: 1.02 (ref 1.005–1.030)
Urobilinogen, Ur: 0.2 mg/dL (ref 0.2–1.0)
pH, UA: 5.5 (ref 5.0–7.5)

## 2018-03-28 SURGERY — COLONOSCOPY WITH PROPOFOL
Anesthesia: Choice

## 2018-03-28 MED ORDER — AMOXICILLIN 875 MG PO TABS: 875.0000 mg | ORAL_TABLET | Freq: Two times a day (BID) | ORAL | 0 refills | Status: DC

## 2018-03-28 NOTE — Progress Notes (Signed)
BP 117/81   Pulse 76   Temp 98.1 F (36.7 C) (Oral)   Ht 5\' 9"  (1.753 m)   LMP 02/09/2017 (Within Weeks)   SpO2 (!) 76%   BMI 38.25 kg/m    Subjective:    Patient ID: Cathy Mitchell, female    DOB: 1965/09/28, 52 y.o.   MRN: [id number]  HPI: Cathy Mitchell is a 52 y.o. female presents for upper respiratory infection  Chief Complaint  Patient presents with  . Ear Pain    Started last night.   Marland Kitchen URI    Started on Friday   ER FOLLOW UP Treated for urine infection with Pyridium and Macrobid. Time since discharge: D/C on 03/08/18 Hospital/facility: [facility] Diagnosis: UTI Procedures/tests: urinalysis Consultants: none New medications: as noted above Discharge instructions:  Follow-up with PCP Status: better  UPPER RESPIRATORY TRACT INFECTION Last treated at beginning of December for ear infection, prior to this was treated in September for ear infection.  Initial symptoms started on Friday with rhinorrhea and congestion.  She reports she went off her vitamins for colonoscopy and then cold symptoms began presenting.  She is followed by ENT.   Worst symptom: cough and ear pain Fever: no Cough: yes Shortness of breath: no Wheezing: no Chest pain: no Chest tightness: no Chest congestion: yes Nasal congestion: yes Runny nose: yes Post nasal drip: yes Sneezing: no Sore throat: no Swollen glands: no Sinus pressure: no Headache: no Face pain: no Toothache: no Ear pain: yes bilateral Ear pressure: yes bilateral Eyes red/itching:no Eye drainage/crusting: no  Vomiting: no Rash: no Fatigue: yes Sick contacts: yes Strep contacts: no  Context: worse Recurrent sinusitis: no Relief with OTC cold/cough medications: no  Treatments attempted: cold/sinus and mucinex   Relevant past medical, surgical, family and social history reviewed and updated as indicated. Interim medical history since our last visit reviewed. Allergies and medications reviewed and updated.  Review of  Systems  Constitutional: Positive for fatigue. Negative for activity change, appetite change, diaphoresis and fever.  HENT: Positive for congestion, ear pain, postnasal drip and rhinorrhea. Negative for ear discharge, sinus pressure, sinus pain, sneezing, sore throat and voice change.   Eyes: Negative.   Respiratory: Negative for cough, chest tightness and shortness of breath.   Cardiovascular: Negative for chest pain, palpitations and leg swelling.  Gastrointestinal: Negative for abdominal distention, abdominal pain, constipation, diarrhea, nausea and vomiting.  Musculoskeletal: Negative.   Neurological: Negative for dizziness, numbness and headaches.  Psychiatric/Behavioral: Negative.     Per HPI unless specifically indicated above     Objective:    BP 117/81   Pulse 76   Temp 98.1 F (36.7 C) (Oral)   Ht 5\' 9"  (1.753 m)   LMP 02/09/2017 (Within Weeks)   SpO2 (!) 76%   BMI 38.25 kg/m   Wt Readings from Last 3 Encounters:  03/07/18 259 lb (117.5 kg)  02/27/18 260 lb (117.9 kg)  12/21/17 263 lb 12.8 oz (119.7 kg)    Physical Exam Vitals signs and nursing note reviewed.  Constitutional:      General: She is awake.     Appearance: She is well-developed and overweight. She is ill-appearing.  HENT:     Head: Normocephalic. No raccoon eyes.     Right Ear: Hearing, ear canal and external ear normal. A middle ear effusion is present. Tympanic membrane is not injected.     Left Ear: Hearing, ear canal and external ear normal. A middle ear effusion is present. Tympanic  membrane is injected.     Ears:     Comments: Both ears cloudy with poor visual of bony landmarks.    Nose: Nose normal.     Right Sinus: No maxillary sinus tenderness or frontal sinus tenderness.     Left Sinus: No maxillary sinus tenderness or frontal sinus tenderness.     Comments: Erythema under external nares and congested sounding.    Mouth/Throat:     Mouth: Mucous membranes are moist.     Pharynx: No  pharyngeal swelling, oropharyngeal exudate or posterior oropharyngeal erythema.  Eyes:     General:        Right eye: No discharge.        Left eye: No discharge.     Conjunctiva/sclera: Conjunctivae normal.     Pupils: Pupils are equal, round, and reactive to light.  Neck:     Musculoskeletal: Normal range of motion and neck supple.     Thyroid: No thyromegaly.     Vascular: No carotid bruit or JVD.  Cardiovascular:     Rate and Rhythm: Normal rate and regular rhythm.     Heart sounds: Normal heart sounds.  Pulmonary:     Effort: Pulmonary effort is normal.     Breath sounds: Normal breath sounds.  Abdominal:     General: Bowel sounds are normal.     Palpations: Abdomen is soft.  Lymphadenopathy:     Head:     Right side of head: No submental, submandibular or tonsillar adenopathy.     Left side of head: No submental, submandibular or tonsillar adenopathy.     Cervical: No cervical adenopathy.  Skin:    General: Skin is warm and dry.  Neurological:     Mental Status: She is alert and oriented to person, place, and time.  Psychiatric:        Attention and Perception: Attention normal.        Mood and Affect: Mood normal.        Behavior: Behavior normal. Behavior is cooperative.        Thought Content: Thought content normal.        Judgment: Judgment normal.     Results for orders placed or performed in visit on 03/10/18  CBC and differential  Result Value Ref Range   Hemoglobin 13.0 12.0 - 16.0   HCT 40 36 - 46   WBC 4.7   Basic metabolic panel  Result Value Ref Range   Glucose 86    BUN 11 4 - 21   Potassium 4.5 3.4 - 5.3   Sodium 142 137 - 147  Lipid panel  Result Value Ref Range   LDl/HDL Ratio 3.0    Triglycerides 101 40 - 160   Cholesterol 166 0 - 200   HDL 56 35 - 70   LDL Cholesterol 90   Hepatic function panel  Result Value Ref Range   Alkaline Phosphatase 91 25 - 125   ALT 20 7 - 35   AST 13 13 - 35   Bilirubin, Total 0.3       Assessment &  Plan:   Problem List Items Addressed This Visit      Nervous and Auditory   Acute otitis media    Appears to be recurrent in nature and she is followed by ENT.  Left ear > right ear on exam.  Amoxicillin 875 MG x 10 days sent to pharmacy.  Claritin or Zyrtec daily until symptoms.  Continue at home  neti pot and Flonase.  Humidifier at home.  May use OTC Mucinex.  Return for worsening or continued symptoms.      Relevant Medications   amoxicillin (AMOXIL) 875 MG tablet       Follow up plan: Return if symptoms worsen or fail to improve.

## 2018-03-28 NOTE — Assessment & Plan Note (Signed)
Appears to be recurrent in nature and she is followed by ENT.  Left ear > right ear on exam.  Amoxicillin 875 MG x 10 days sent to pharmacy.  Claritin or Zyrtec daily until symptoms.  Continue at home neti pot and Flonase.  Humidifier at home.  May use OTC Mucinex.  Return for worsening or continued symptoms.

## 2018-03-28 NOTE — Progress Notes (Signed)
Normal test results noted.  Please call patient and make them aware of normal results and will continue to monitor at regular visits.  Have a great day

## 2018-03-28 NOTE — Patient Instructions (Addendum)
Claritin or Zyrtec daily at home.  Upper Respiratory Infection, Adult An upper respiratory infection (URI) affects the nose, throat, and upper air passages. URIs are caused by germs (viruses). The most common type of URI is often called "the common cold." Medicines cannot cure URIs, but you can do things at home to relieve your symptoms. URIs usually get better within 7-10 days. Follow these instructions at home: Activity  Rest as needed.  If you have a fever, stay home from work or school until your fever is gone, or until your doctor says you may return to work or school. ? You should stay home until you cannot spread the infection anymore (you are not contagious). ? Your doctor may have you wear a face mask so you have less risk of spreading the infection. Relieving symptoms  Gargle with a salt-water mixture 3-4 times a day or as needed. To make a salt-water mixture, completely dissolve -1 tsp of salt in 1 cup of warm water.  Use a cool-mist humidifier to add moisture to the air. This can help you breathe more easily. Eating and drinking   Drink enough fluid to keep your pee (urine) pale yellow.  Eat soups and other clear broths. General instructions   Take over-the-counter and prescription medicines only as told by your doctor. These include cold medicines, fever reducers, and cough suppressants.  Do not use any products that contain nicotine or tobacco. These include cigarettes and e-cigarettes. If you need help quitting, ask your doctor.  Avoid being where people are smoking (avoid secondhand smoke).  Make sure you get regular shots and get the flu shot every year.  Keep all follow-up visits as told by your doctor. This is important. How to avoid spreading infection to others   Wash your hands often with soap and water. If you do not have soap and water, use hand sanitizer.  Avoid touching your mouth, face, eyes, or nose.  Cough or sneeze into a tissue or your sleeve  or elbow. Do not cough or sneeze into your hand or into the air. Contact a doctor if:  You are getting worse, not better.  You have any of these: ? A fever. ? Chills. ? Brown or red mucus in your nose. ? Yellow or brown fluid (discharge)coming from your nose. ? Pain in your face, especially when you bend forward. ? Swollen neck glands. ? Pain with swallowing. ? White areas in the back of your throat. Get help right away if:  You have shortness of breath that gets worse.  You have very bad or constant: ? Headache. ? Ear pain. ? Pain in your forehead, behind your eyes, and over your cheekbones (sinus pain). ? Chest pain.  You have long-lasting (chronic) lung disease along with any of these: ? Wheezing. ? Long-lasting cough. ? Coughing up blood. ? A change in your usual mucus.  You have a stiff neck.  You have changes in your: ? Vision. ? Hearing. ? Thinking. ? Mood. Summary  An upper respiratory infection (URI) is caused by a germ called a virus. The most common type of URI is often called "the common cold."  URIs usually get better within 7-10 days.  Take over-the-counter and prescription medicines only as told by your doctor. This information is not intended to replace advice given to you by your health care provider. Make sure you discuss any questions you have with your health care provider. Document Released: 09/02/2007 Document Revised: 11/06/2016 Document Reviewed: 11/06/2016 Elsevier  Interactive Patient Education  [employer].

## 2018-03-28 NOTE — Telephone Encounter (Signed)
Pt left vm to r/s her procedure she states she has a head cold and was told we could not do the procedure with a cold

## 2018-03-31 ENCOUNTER — Ambulatory Visit: Payer: BLUE CROSS/BLUE SHIELD | Admitting: Nurse Practitioner

## 2018-04-01 ENCOUNTER — Other Ambulatory Visit: Payer: Self-pay

## 2018-04-01 DIAGNOSIS — Z1211 Encounter for screening for malignant neoplasm of colon: Secondary | ICD-10-CM

## 2018-04-01 NOTE — Telephone Encounter (Signed)
Returned patients call to reschedule her missed colonoscopy.  She has been rescheduled to 04/19/18 with Dr. Allen Norris at [facility].  Thanks [employer]

## 2018-04-18 ENCOUNTER — Telehealth: Payer: Self-pay | Admitting: Gastroenterology

## 2018-04-18 NOTE — Telephone Encounter (Signed)
Patients call has been returned.  Explained to her that on 03/29/19 she requested to cancel her colonoscopy originally scheduled at [facility] and she was rescheduled to 01/21 at [facility] and that's why her instructions are for [facility].  I informed her that I would be more than happy to reschedule her to 04/27/18 she said she was not inclined to have her procedure with [place] now.  Colonoscopy has been canceled.  Cancellation Fee Applies.  Thanks [employer]

## 2018-04-18 NOTE — Telephone Encounter (Signed)
Pt left vm she states she was under the impression her procedure was In [place] on 04/27/18 and she is receiving paper work that that's it is 04/19/18 In [facility] she is confused and would like a call back

## 2018-04-19 ENCOUNTER — Ambulatory Visit: Admit: 2018-04-19 | Payer: BLUE CROSS/BLUE SHIELD | Admitting: Gastroenterology

## 2018-04-19 SURGERY — COLONOSCOPY WITH PROPOFOL
Anesthesia: General

## 2018-06-27 ENCOUNTER — Telehealth: Payer: BLUE CROSS/BLUE SHIELD | Admitting: Family

## 2018-06-27 DIAGNOSIS — H9203 Otalgia, bilateral: Secondary | ICD-10-CM

## 2018-06-27 NOTE — Progress Notes (Signed)
Based on what you shared with me, I feel your condition warrants further evaluation and I recommend that you be seen for a face to face office visit.     NOTE: If you entered your credit card information for this eVisit, you will not be charged. You may see a "hold" on your card for the $35 but that hold will drop off and you will not have a charge processed.  If you are having a true medical emergency please call 911.  If you need an urgent face to face visit, [facility] has four urgent care centers for your convenience.    PLEASE NOTE: THE [facility] LOCATIONS AND URGENT CARE CLINICS DO NOT HAVE THE TESTING FOR CORONAVIRUS COVID19 AVAILABLE.  IF YOU FEEL YOU NEED THIS TEST YOU MUST GO TO A TRIAGE LOCATION AT [facility]   [email or web address] to reserve your spot online an avoid wait times  [facility] [address] [place], [place] [address] 8 am to 8 pm Monday-Friday 10 am to 4 pm Saturday-Sunday *Across the street from [employer]  [place], [address] 8 am to 5 pm Monday-Friday * In the [facility] on the [facility]   The following sites will take your insurance:  . [facility] [place] a Provider at this Location  [address] [place], [place] [address] . 10 am to 8 pm Monday-Friday . 12 pm to 8 pm Saturday-Sunday   . [facility] at [facility] a Provider at this Location  [place], [place], [place] [address] . 8 am to 8 pm Monday-Friday . 9 am to 6 pm Saturday . 11 am to 6 pm Sunday   . [facility] at [place] Get Driving Directions  [address] [place], [place] [address] . 8 am to 8 pm Monday-Friday . 8 am to 4 pm Saturday-Sunday   Your e-visit answers were  reviewed by a board certified advanced clinical practitioner to complete your personal care plan.  Thank you for using e-Visits.

## 2018-07-19 ENCOUNTER — Telehealth: Payer: Self-pay | Admitting: Cardiovascular Disease

## 2018-07-19 NOTE — Telephone Encounter (Signed)
Virtual Visit Pre-Appointment Phone Call  "(Name), I am calling you today to discuss your upcoming appointment. We are currently trying to limit exposure to the virus that causes COVID-19 by seeing patients at home rather than in the office."  1. "What is the BEST phone number to call the day of the visit?" - include this in appointment notes  2. Do you have or have access to (through a family member/friend) a smartphone with video capability that we can use for your visit?" a. If yes - list this number in appt notes as cell (if different from BEST phone #) and list the appointment type as a VIDEO visit in appointment notes b. If no - list the appointment type as a PHONE visit in appointment notes  3. Confirm consent - "In the setting of the current Covid19 crisis, you are scheduled for a (phone or video) visit with your provider on (date) at (time).  Just as we do with many in-office visits, in order for you to participate in this visit, we must obtain consent.  If you'd like, I can send this to your mychart (if signed up) or email for you to review.  Otherwise, I can obtain your verbal consent now.  All virtual visits are billed to your insurance company just like a normal visit would be.  By agreeing to a virtual visit, we'd like you to understand that the technology does not allow for your provider to perform an examination, and thus may limit your provider's ability to fully assess your condition. If your provider identifies any concerns that need to be evaluated in person, we will make arrangements to do so.  Finally, though the technology is pretty good, we cannot assure that it will always work on either your or our end, and in the setting of a video visit, we may have to convert it to a phone-only visit.  In either situation, we cannot ensure that we have a secure connection.  Are you willing to proceed?" STAFF: Did the patient verbally acknowledge consent to telehealth visit? Document  YES/NO here: Yes  4. Advise patient to be prepared - "Two hours prior to your appointment, go ahead and check your blood pressure, pulse, oxygen saturation, and your weight (if you have the equipment to check those) and write them all down. When your visit starts, your provider will ask you for this information. If you have an Apple Watch or Kardia device, please plan to have heart rate information ready on the day of your appointment. Please have a pen and paper handy nearby the day of the visit as well."  5. Give patient instructions for MyChart download to smartphone OR Doximity/Doxy.me as below if video visit (depending on what platform provider is using)  6. Inform patient they will receive a phone call 15 minutes prior to their appointment time (may be from unknown caller ID) so they should be prepared to answer    TELEPHONE CALL NOTE  Cathy Mitchell has been deemed a candidate for a follow-up tele-health visit to limit community exposure during the Covid-19 pandemic. I spoke with the patient via phone to ensure availability of phone/video source, confirm preferred email & phone number, and discuss instructions and expectations.  I reminded Cathy Mitchell to be prepared with any vital sign and/or heart rhythm information that could potentially be obtained via home monitoring, at the time of her visit. I reminded Cathy Mitchell to expect a phone call prior to  her visit.  Ace Gins 07/19/2018 3:59 PM   INSTRUCTIONS FOR DOWNLOADING THE MYCHART APP TO SMARTPHONE  - The patient must first make sure to have activated MyChart and know their login information - If Apple, go to [employer] and type in MyChart in the search bar and download the app. If Android, ask patient to go to [employer] and type in [place] in the search bar and download the app. The app is free but as with any other app downloads, their phone may require them to verify saved payment information or Apple/Android  password.  - The patient will need to then log into the app with their MyChart username and password, and select [facility] as their healthcare provider to link the account. When it is time for your visit, go to the MyChart app, find appointments, and click Begin Video Visit. Be sure to Select Allow for your device to access the Microphone and Camera for your visit. You will then be connected, and your provider will be with you shortly.  **If they have any issues connecting, or need assistance please contact MyChart service desk [phone])**  **If using a computer, in order to ensure the best quality for their visit they will need to use either of the following Internet Browsers: [employer], or Google Chrome**  IF USING DOXIMITY or DOXY.ME - The patient will receive a link just prior to their visit by text.     FULL LENGTH CONSENT FOR TELE-HEALTH VISIT   I hereby voluntarily request, consent and authorize [facility] and its employed or contracted physicians, physician assistants, nurse practitioners or other licensed health care professionals (the Practitioner), to provide me with telemedicine health care services (the Services") as deemed necessary by the treating Practitioner. I acknowledge and consent to receive the Services by the Practitioner via telemedicine. I understand that the telemedicine visit will involve communicating with the Practitioner through live audiovisual communication technology and the disclosure of certain medical information by electronic transmission. I acknowledge that I have been given the opportunity to request an in-person assessment or other available alternative prior to the telemedicine visit and am voluntarily participating in the telemedicine visit.  I understand that I have the right to withhold or withdraw my consent to the use of telemedicine in the course of my care at any time, without affecting my right to future care or treatment,  and that the Practitioner or I may terminate the telemedicine visit at any time. I understand that I have the right to inspect all information obtained and/or recorded in the course of the telemedicine visit and may receive copies of available information for a reasonable fee.  I understand that some of the potential risks of receiving the Services via telemedicine include:   Delay or interruption in medical evaluation due to technological equipment failure or disruption;  Information transmitted may not be sufficient (e.g. poor resolution of images) to allow for appropriate medical decision making by the Practitioner; and/or   In rare instances, security protocols could fail, causing a breach of personal health information.  Furthermore, I acknowledge that it is my responsibility to provide information about my medical history, conditions and care that is complete and accurate to the best of my ability. I acknowledge that Practitioner's advice, recommendations, and/or decision may be based on factors not within their control, such as incomplete or inaccurate data provided by me or distortions of diagnostic images or specimens that may result from electronic transmissions. I understand  that the practice of medicine is not an exact science and that Practitioner makes no warranties or guarantees regarding treatment outcomes. I acknowledge that I will receive a copy of this consent concurrently upon execution via email to the email address I last provided but may also request a printed copy by calling the office of [facility].    I understand that my insurance will be billed for this visit.   I have read or had this consent read to me.  I understand the contents of this consent, which adequately explains the benefits and risks of the Services being provided via telemedicine.   I have been provided ample opportunity to ask questions regarding this consent and the Services and have had my questions  answered to my satisfaction.  I give my informed consent for the services to be provided through the use of telemedicine in my medical care  By participating in this telemedicine visit I agree to the above.

## 2018-07-20 ENCOUNTER — Other Ambulatory Visit: Payer: Self-pay

## 2018-07-20 ENCOUNTER — Telehealth (INDEPENDENT_AMBULATORY_CARE_PROVIDER_SITE_OTHER): Payer: BLUE CROSS/BLUE SHIELD | Admitting: Cardiovascular Disease

## 2018-07-20 ENCOUNTER — Encounter: Payer: Self-pay | Admitting: Cardiovascular Disease

## 2018-07-20 VITALS — Ht 69.0 in | Wt 259.0 lb

## 2018-07-20 DIAGNOSIS — I493 Ventricular premature depolarization: Secondary | ICD-10-CM | POA: Diagnosis not present

## 2018-07-20 NOTE — Progress Notes (Signed)
Virtual Visit via Video Note   This visit type was conducted due to national recommendations for restrictions regarding the COVID-19 Pandemic (e.g. social distancing) in an effort to limit this patient's exposure and mitigate transmission in our community.  Due to her co-morbid illnesses, this patient is at least at moderate risk for complications without adequate follow up.  This format is felt to be most appropriate for this patient at this time.  All issues noted in this document were discussed and addressed.  A limited physical exam was performed with this format.  Please refer to the patient's chart for her consent to telehealth for [facility].   Evaluation Performed:  Follow-up visit  Date:  07/20/2018   ID:  Cathy Mitchell, DOB 1966/02/19, MRN [id number]  Patient Location: Other:  her car Provider Location: Office  PCP:  Venita Lick, NP  Cardiologist:  Kathlyn Sacramento, MD  Electrophysiologist:  None   Chief Complaint: Follow-up visit regarding PVCs.  History of Present Illness:    Cathy Mitchell is a 53 y.o. female was seen via video conferencing.  This is a follow-up visit regarding frequent symptomatic PVCs. She underwent a treadmill stress test in September 2016 before having gastric bypass surgery.  The stress test was normal.  She has no history of diabetes or hyperlipidemia. She does have sleep apnea and refuses CPAP.  She has no family history of premature coronary artery disease.  She is not a smoker and does not consume excessive amount of caffeine.  She does not drink alcohol.  Holter monitor in December 2018 showed very frequent PVCs with a total of 24,000 beats in 48 hours representing 12% burden. Echocardiogram showed normal LV systolic function with mild mitral regurgitation.   She was treated with metoprolol 25 mg twice daily initially with very good response but  Had worsening symptoms last year and was switched to diltiazem.  Repeat Holter monitor showed  5000 PVCs in 24 hours representing a 5% burden. She has felt significantly better since switching to diltiazem and currently has minimal palpitations.  No chest pain or shortness of breath.  The patient does not have symptoms concerning for COVID-19 infection (fever, chills, cough, or new shortness of breath).    Past Medical History:  Diagnosis Date  . Anxiety   . Arthritis   . Fibromyalgia   . Hypertension    no longer has hypertension  . Neuromuscular disorder ([place])    L-sided tremors  . Sleep apnea    does not use C-PAP   Past Surgical History:  Procedure Laterality Date  . CARPAL TUNNEL RELEASE Bilateral 2007  . DILATION AND CURETTAGE OF UTERUS    . GANGLION CYST EXCISION Bilateral   . GANGLION CYST EXCISION Right 10/08/2015   Procedure: REMOVAL GANGLION OF WRIST;  Surgeon: Hessie Knows, MD;  Location: [facility] ORS;  Service: Orthopedics;  Laterality: Right;  . GASTRIC BYPASS    . HAND SURGERY Bilateral Resection  . HYSTEROSCOPY W/D&C N/A 05/24/2017   Procedure: DILATATION AND CURETTAGE /HYSTEROSCOPY;  Surgeon: Defrancesco, Alanda Slim, MD;  Location: [facility] ORS;  Service: Gynecology;  Laterality: N/A;  . INCONTINENCE SURGERY  2009  . LEEP N/A 05/24/2017   Procedure: LOOP ELECTROSURGICAL EXCISION PROCEDURE (LEEP);  Surgeon: Brayton Mars, MD;  Location: [facility] ORS;  Service: Gynecology;  Laterality: N/A;  . TUBAL LIGATION       Current Meds  Medication Sig  . acetaminophen (TYLENOL) 500 MG tablet Take 1,500 mg by mouth daily  as needed for mild pain.  . Ascorbic Acid (VITAMIN C) 1000 MG tablet Take 1,000 mg by mouth daily.  [place] Kitchen azelastine (OPTIVAR) 0.05 % ophthalmic solution Place 1 drop into both eyes 2 (two) times daily.  . cetirizine (ZYRTEC) 10 MG tablet Take 1 tablet (10 mg total) by mouth daily.  . cholecalciferol (VITAMIN D) 1000 units tablet Take 1,000 Units by mouth daily.  . cyanocobalamin 1000 MCG tablet Take 1,000 mcg by mouth daily.  . diclofenac sodium (VOLTAREN)  1 % GEL Apply 2 g topically daily as needed (pain).  Mar[place] Kitchen diltiazem (CARDIZEM CD) 120 MG 24 hr capsule Take 1 capsule (120 mg total) by mouth daily.  . fluticasone (FLONASE) 50 MCG/ACT nasal spray Place 2 sprays into both nostrils daily as needed for allergies.  Mar[place] Kitchen glucosamine-chondroitin 500-400 MG tablet Take 1 tablet by mouth daily.  . Multiple Minerals-Vitamins (CAL-MAG-ZINC-D PO) Take 1 tablet by mouth daily.  . Multiple Vitamins-Minerals (MULTIPLE VITAMINS/WOMENS) tablet Take 2 tablets by mouth daily. women's 50+  . Potassium 99 MG TABS Take 99 mg by mouth daily.  . Turmeric 500 MG TABS Take 1 tablet by mouth daily.  . Zinc 50 MG TABS Take 1 tablet by mouth daily.     Allergies:   Oxycodone-acetaminophen; Nsaids; Other; and Percocet [oxycodone-acetaminophen]   Social History   Tobacco Use  . Smoking status: Former Smoker    Packs/day: 0.50    Years: 29.00    Pack years: 14.50    Types: Cigarettes    Last attempt to quit: 01/29/2015    Years since quitting: 3.4  . Smokeless tobacco: Never Used  Substance Use Topics  . Alcohol use: No    Alcohol/week: 0.0 standard drinks  . Drug use: No     Family Hx: The patient's family history includes COPD in her father; Diabetes in her paternal grandfather; Eczema in her son; Heart disease in her maternal grandfather and paternal grandfather; Thyroid disease in her daughter.  ROS:   Please see the history of present illness.     All other systems reviewed and are negative.   Prior CV studies:   The following studies were reviewed today: Reviewed most recent Holter monitor results with her.   Labs/Other Tests and Data Reviewed:    EKG:  No ECG reviewed.  Recent Labs: 03/08/2018: Creatinine, Ser 0.53; Platelets 365 03/09/2018: ALT 20; BUN 11; Hemoglobin 13.0; Potassium 4.5; Sodium 142   Recent Lipid Panel Lab Results  Component Value Date/Time   CHOL 166 03/09/2018 02:35 PM   CHOL 145 07/31/2016 11:10 AM   TRIG 101  03/09/2018 02:35 PM   HDL 56 03/09/2018 02:35 PM   HDL 48 07/31/2016 11:10 AM   CHOLHDL 4.3 12/03/2014 04:19 PM   LDLCALC 90 03/09/2018 02:35 PM   LDLCALC 73 07/31/2016 11:10 AM    Wt Readings from Last 3 Encounters:  07/20/18 259 lb (117.5 kg)  03/07/18 259 lb (117.5 kg)  02/27/18 260 lb (117.9 kg)     Objective:    Vital Signs:  Ht 5\' 9"  (1.753 m)   Wt 259 lb (117.5 kg)   LMP 02/09/2017 (Within Weeks)   BMI 38.25 kg/m    VITAL SIGNS:  reviewed GEN:  no acute distress EYES:  sclerae anicteric, EOMI - Extraocular Movements Intact RESPIRATORY:  normal respiratory effort, symmetric expansion CARDIOVASCULAR:  no peripheral edema NEURO:  alert and oriented x 3, no obvious focal deficit PSYCH:  normal affect  ASSESSMENT & PLAN:  1. Symptomatic PVCs: Significant improvement in symptoms with diltiazem.  No evidence of ischemic or structural heart disease on previous testing. 2. Sleep apnea: She refuses CPAP and currently does not seem to be very symptomatic.  COVID-19 Education: The signs and symptoms of COVID-19 were discussed with the patient and how to seek care for testing (follow up with PCP or arrange E-visit).  The importance of social distancing was discussed today.  Time:   Today, I have spent 15 minutes with the patient with telehealth technology discussing the above problems.     Medication Adjustments/Labs and Tests Ordered: Current medicines are reviewed at length with the patient today.  Concerns regarding medicines are outlined above.   Tests Ordered: No orders of the defined types were placed in this encounter.   Medication Changes: No orders of the defined types were placed in this encounter.   Disposition:  Follow up in 6 month(s)  Signed, Kathlyn Sacramento, MD  07/20/2018 11:30 AM     [facility]

## 2018-07-20 NOTE — Patient Instructions (Signed)
Medication Instructions:  Continue same medications If you need a refill on your cardiac medications before your next appointment, please call your pharmacy.   Lab work: None If you have labs (blood work) drawn today and your tests are completely normal, you will receive your results only by: . MyChart Message (if you have MyChart) OR . A paper copy in the mail If you have any lab test that is abnormal or we need to change your treatment, we will call you to review the results.  Testing/Procedures: None  Follow-Up: At [facility], you and your health needs are our priority.  As part of our continuing mission to provide you with exceptional heart care, we have created designated Provider Care Teams.  These Care Teams include your primary Cardiologist (physician) and Advanced Practice Providers (APPs -  Physician Assistants and Nurse Practitioners) who all work together to provide you with the care you need, when you need it. You will need a follow up appointment in 6 months.  Please call our office 2 months in advance to schedule this appointment.  You may see Muhammad Arida, MD or one of the following Advanced Practice Providers on your designated Care Team:   Christopher Berge, NP Ryan Dunn, PA-C . Jacquelyn Visser, PA-C   

## 2018-08-30 DIAGNOSIS — M79671 Pain in right foot: Secondary | ICD-10-CM | POA: Diagnosis not present

## 2018-08-30 DIAGNOSIS — M65871 Other synovitis and tenosynovitis, right ankle and foot: Secondary | ICD-10-CM | POA: Diagnosis not present

## 2018-09-05 ENCOUNTER — Encounter: Payer: Self-pay | Admitting: Nurse Practitioner

## 2018-09-05 ENCOUNTER — Ambulatory Visit: Payer: Self-pay | Admitting: *Deleted

## 2018-09-05 ENCOUNTER — Other Ambulatory Visit: Payer: Self-pay

## 2018-09-05 ENCOUNTER — Ambulatory Visit (INDEPENDENT_AMBULATORY_CARE_PROVIDER_SITE_OTHER): Payer: BC Managed Care – PPO | Admitting: Nurse Practitioner

## 2018-09-05 ENCOUNTER — Telehealth: Payer: Self-pay | Admitting: Nurse Practitioner

## 2018-09-05 DIAGNOSIS — Z20828 Contact with and (suspected) exposure to other viral communicable diseases: Secondary | ICD-10-CM

## 2018-09-05 DIAGNOSIS — Z20822 Contact with and (suspected) exposure to covid-19: Secondary | ICD-10-CM | POA: Insufficient documentation

## 2018-09-05 NOTE — Telephone Encounter (Signed)
Called pt to schedule covid-19 testing. Pt is scheduled for tomorrow 09/06/18 at 8:15 at the [facility] in [place]. Pt advised to wear a mask, stay in car with windows rolled up until time for testing. Pt voiced understanding. She will be tested with her son.

## 2018-09-05 NOTE — Telephone Encounter (Signed)
Pt called stating that she and her son were exposed to [place] at church on 08/28/2018; she says that multiple people are sick; dizziness, chest pressure, headache; recommendations made per nurse triage protocol; she verbalized understanding; call transferred to [place], at [facility] for scheduling. Reason for Disposition . Chest pain or pressure  Answer Assessment - Initial Assessment Questions 1. COVID-19 DIAGNOSIS: "Who made your Coronavirus (COVID-19) diagnosis?" "Was it confirmed by a positive lab test?" If not diagnosed by a HCP, ask "Are there lots of cases (community spread) where you live?" (See public health department website, if unsure)     Community spread 2. ONSET: "When did the COVID-19 symptoms start?"      08/31/2018 3. WORST SYMPTOM: "What is your worst symptom?" (e.g., cough, fever, shortness of breath, muscle aches)    Dizziness like vertigo 4. COUGH: "Do you have a cough?" If so, ask: "How bad is the cough?"       Yes, not like if she had a cold 5. FEVER: "Do you have a fever?" If so, ask: "What is your temperature, how was it measured, and when did it start?"     no 6. RESPIRATORY STATUS: "Describe your breathing?" (e.g., shortness of breath, wheezing, unable to speak)      Chest pressure/tightness rated 1 out of 10 7. BETTER-SAME-WORSE: "Are you getting better, staying the same or getting worse compared to yesterday?"  If getting worse, ask, "In what way?"     The same 8. HIGH RISK DISEASE: "Do you have any chronic medical problems?" (e.g., asthma, heart or lung disease, weak immune system, etc.)    Fibromyalgia, arrthytmia (takes diltiazem)   9. PREGNANCY: "Is there any chance you are pregnant?" "When was your last menstrual period?"     No no 10. OTHER SYMPTOMS: "Do you have any other symptoms?"  (e.g., chills, fatigue, headache, loss of smell or taste, muscle pain, sore throat)       Headache, dzziness,  Protocols used: CORONAVIRUS (COVID-19) DIAGNOSED  OR SUSPECTED-A-AH

## 2018-09-05 NOTE — Progress Notes (Signed)
Temp (!) 95.3 F (35.2 C) (Oral)   Ht 5\' 9"  (1.753 m)   Wt 280 lb (127 kg)   LMP 02/09/2017 (Within Weeks)   BMI 41.35 kg/m    Subjective:    Patient ID: Cathy Mitchell, female    DOB: [birth year], 53 y.o.   MRN: [id number]  HPI: Cathy Mitchell is a 53 y.o. female  Chief Complaint  Patient presents with  . Cough    pt states she has been exposed to covid 19 last Sunday  . Headache  . 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  Current patient symptoms started 24 hours ago.   Worst symptom: dizziness Fever: no Cough: yes Shortness of breath: no Wheezing: no Chest pain: no Chest tightness: no Chest congestion: no Nasal congestion: no Runny nose: no Post nasal drip: no Sneezing: no Sore throat: no Swollen glands: no Sinus pressure: no Headache: yes Face pain: no Toothache: no Ear pain: none Ear pressure: none Eyes red/itching:no Eye drainage/crusting: no  Vomiting: no Rash: no Fatigue: yes Sick contacts: yes Strep contacts: no  Context: fluctuating Recurrent sinusitis: no Relief with OTC cold/cough medications: no  Treatments attempted: none   Relevant past medical, surgical, family and social history reviewed and updated as indicated. Interim medical history since our last visit reviewed. Allergies and medications reviewed and updated.  Review of Systems  Constitutional: Positive for fatigue. Negative for activity change, appetite change, diaphoresis and fever.  HENT: Negative for congestion, ear discharge, ear pain, facial swelling, postnasal drip, rhinorrhea, sinus pressure, sinus pain, sneezing, sore throat and voice change.   Eyes: Negative for pain and visual disturbance.  Respiratory: Positive for cough. Negative for chest tightness, shortness of breath and wheezing.    Cardiovascular: Negative for chest pain, palpitations and leg swelling.  Gastrointestinal: Negative for abdominal distention, abdominal pain, constipation, diarrhea, nausea and vomiting.  Endocrine: Negative.   Musculoskeletal: Negative for myalgias.  Neurological: Positive for dizziness and headaches. Negative for weakness and numbness.  Psychiatric/Behavioral: Negative.     Per HPI unless specifically indicated above     Objective:    Temp (!) 95.3 F (35.2 C) (Oral)   Ht 5\' 9"  (1.753 m)   Wt 280 lb (127 kg)   LMP 02/09/2017 (Within Weeks)   BMI 41.35 kg/m   Wt Readings from Last 3 Encounters:  09/05/18 280 lb (127 kg)  07/20/18 259 lb (117.5 kg)  03/07/18 259 lb (117.5 kg)    Physical Exam   Unable to perform video exam due to no virtual access.  Results for orders placed or performed in visit on 03/28/18  UA/M w/rflx Culture, Routine  Result Value Ref Range   Specific Gravity, UA 1.020 1.005 - 1.030   pH, UA 5.5 5.0 - 7.5   Color, UA Yellow Yellow   Appearance Ur Clear Clear   Leukocytes, UA Negative Negative   Protein, UA Negative Negative/Trace   Glucose, UA Negative Negative   Ketones, UA Negative Negative   RBC, UA Negative Negative   Bilirubin, UA Negative Negative   Urobilinogen, Ur 0.2 0.2 - 1.0 mg/dL   Nitrite, UA Negative Negative      Assessment & Plan:   Problem List Items Addressed This Visit      Other   Close Exposure to Covid-19 Virus    With  acute symptoms x 24 hours.  Message to [facility] testing pool to obtain testing.   Have recommended maintaining a 14 day at home quarantine at this time based on current symptoms and exposure.  Patient agrees with this POC.  They will return in 2 weeks for work clearance and lung check.  Recommend simple treatment at home with OTC cold medications as needed.  To be seen immediately if worsening symptoms, to ER.  Return in 14 days for follow-up.         I discussed the assessment and treatment plan with the  patient. The patient was provided an opportunity to ask questions and all were answered. The patient agreed with the plan and demonstrated an understanding of the instructions.   The patient was advised to call back or seek an in-person evaluation if the symptoms worsen or if the condition fails to improve as anticipated.   I provided 15 minutes of time during this encounter.  Follow up plan: Return in about 2 weeks (around 09/19/2018) for covid follow-up.

## 2018-09-05 NOTE — Assessment & Plan Note (Signed)
With acute symptoms x 24 hours.  Message to [facility] testing pool to obtain testing.   Have recommended maintaining a 14 day at home quarantine at this time based on current symptoms and exposure.  Patient agrees with this POC.  They will return in 2 weeks for work clearance and lung check.  Recommend simple treatment at home with OTC cold medications as needed.  To be seen immediately if worsening symptoms, to ER.  Return in 14 days for follow-up.

## 2018-09-05 NOTE — Patient Instructions (Signed)
° ° °  Person Under Monitoring Name: Cathy Mitchell  Location: [address] [place] [address]   CORONAVIRUS DISEASE 2019 (COVID-19) Guidance for Persons Under Investigation You are being tested for the virus that causes coronavirus disease 2019 (COVID-19). Public health actions are necessary to ensure protection of your health and the health of others, and to prevent further spread of infection. COVID-19 is caused by a virus that can cause symptoms, such as fever, cough, and shortness of breath. The primary transmission from person to person is by coughing or sneezing. On April 28, 2018, the [place] announced a [employer] Emergency of International Concern and on April 29, 2018 the U.[place]. Department of Health and Human Services declared a public health emergency. If the virus that causesCOVID-19 spreads in the community, it could have severe public health consequences.  As a person under investigation for COVID-19, the [place] advises you to adhere to the following guidance until your test results are reported to you. If your test result is positive, you will receive additional information from your provider and your local health department at that time.   Remain at home until you are cleared by your health provider or public health authorities.   Keep a log of visitors to your home using the form provided. Any visitors to your home must be aware of your isolation status.  If you plan to move to a new address or leave the county, notify the local health department in your county.  Call a doctor or seek care if you have an urgent medical need. Before seeking medical care, call ahead and get instructions from the provider before arriving at the medical office, clinic or hospital. Notify them that you are being tested for the virus that causes COVID-19 so arrangements can be made, as necessary, to prevent  transmission to others in the healthcare setting. Next, notify the local health department in your county.  If a medical emergency arises and you need to call 911, inform the first responders that you are being tested for the virus that causes COVID-19. Next, notify the local health department in your county.  Adhere to all guidance set forth by the [place] for [facility] of patients that is based on guidance from the Center for Disease Control and Prevention with suspected or confirmed COVID-19. It is provided with this guidance for Persons Under Investigation.  Your health and the health of our community are our top priorities. Public Health officials remain available to provide assistance and counseling to you about COVID-19 and compliance with this guidance.  Provider: ____________________________________________________________ Date: ______/_____/_________  By signing below, you acknowledge that you have read and agree to comply with this Guidance for Persons Under Investigation. ______________________________________________________________ Date: ______/_____/_________  WHO DO I CALL? You can find a list of local health departments here: [email or web address] Health Department: ____________________________________________________________________ Contact Name: ________________________________________________________________________ Telephone: ___________________________________________________________________________  Marice Potter, [place], Communicable Disease Branch COVID-19 Guidance for Persons Under Investigation June 04, 2018

## 2018-09-06 ENCOUNTER — Other Ambulatory Visit: Payer: BLUE CROSS/BLUE SHIELD

## 2018-09-06 DIAGNOSIS — R6889 Other general symptoms and signs: Secondary | ICD-10-CM | POA: Diagnosis not present

## 2018-09-06 DIAGNOSIS — Z20822 Contact with and (suspected) exposure to covid-19: Secondary | ICD-10-CM

## 2018-09-08 ENCOUNTER — Telehealth: Payer: Self-pay | Admitting: Nurse Practitioner

## 2018-09-08 NOTE — Telephone Encounter (Signed)
Pt called looking for covid-19 test results. No results noted.

## 2018-09-09 LAB — NOVEL CORONAVIRUS, NAA: SARS-CoV-2, NAA: NOT DETECTED

## 2018-09-13 DIAGNOSIS — M79671 Pain in right foot: Secondary | ICD-10-CM | POA: Diagnosis not present

## 2018-09-13 DIAGNOSIS — M65871 Other synovitis and tenosynovitis, right ankle and foot: Secondary | ICD-10-CM | POA: Diagnosis not present

## 2018-09-21 ENCOUNTER — Other Ambulatory Visit: Payer: Self-pay

## 2018-09-21 ENCOUNTER — Encounter: Payer: Self-pay | Admitting: Family Medicine

## 2018-09-21 ENCOUNTER — Ambulatory Visit: Payer: BC Managed Care – PPO | Admitting: Family Medicine

## 2018-09-21 VITALS — BP 104/72 | HR 73 | Temp 98.0°F | Ht 69.0 in

## 2018-09-21 DIAGNOSIS — J069 Acute upper respiratory infection, unspecified: Secondary | ICD-10-CM | POA: Diagnosis not present

## 2018-09-21 NOTE — Progress Notes (Signed)
   BP 104/72   Pulse 73   Temp 98 F (36.7 C) (Oral)   Ht 5\' 9"  (1.753 m)   LMP 02/09/2017 (Within Weeks)   SpO2 96%   BMI 41.35 kg/m    Subjective:    Patient ID: Cathy Mitchell, female    DOB: 03/28/1966, 53 y.o.   MRN: [id number]  HPI: Cathy Mitchell is a 53 y.o. female  Chief Complaint  Patient presents with  . Covid 19    f/u   Here today for 2 week suspected COVID f/u. Tested negative but had positive exposures and had dizziness, cough, and headache. Feeling completely better at this time. Denies fever, chills, CP, SOB.   Relevant past medical, surgical, family and social history reviewed and updated as indicated. Interim medical history since our last visit reviewed. Allergies and medications reviewed and updated.  Review of Systems  Per HPI unless specifically indicated above     Objective:    BP 104/72   Pulse 73   Temp 98 F (36.7 C) (Oral)   Ht 5\' 9"  (1.753 m)   LMP 02/09/2017 (Within Weeks)   SpO2 96%   BMI 41.35 kg/m   Wt Readings from Last 3 Encounters:  09/05/18 280 lb (127 kg)  07/20/18 259 lb (117.5 kg)  03/07/18 259 lb (117.5 kg)    Physical Exam Vitals signs and nursing note reviewed.  Constitutional:      Appearance: Normal appearance. She is not ill-appearing.  HENT:     Head: Atraumatic.     Right Ear: Tympanic membrane normal.     Left Ear: Tympanic membrane normal.     Nose: Nose normal.     Mouth/Throat:     Mouth: Mucous membranes are moist.     Pharynx: Oropharynx is clear.  Eyes:     Extraocular Movements: Extraocular movements intact.     Conjunctiva/sclera: Conjunctivae normal.  Neck:     Musculoskeletal: Normal range of motion and neck supple.  Cardiovascular:     Rate and Rhythm: Normal rate and regular rhythm.     Heart sounds: Normal heart sounds.  Pulmonary:     Effort: Pulmonary effort is normal.     Breath sounds: Normal breath sounds.  Musculoskeletal: Normal range of motion.  Skin:    General: Skin is warm  and dry.  Neurological:     Mental Status: She is alert and oriented to person, place, and time.  Psychiatric:        Mood and Affect: Mood normal.        Thought Content: Thought content normal.        Judgment: Judgment normal.     Results for orders placed or performed in visit on 09/06/18  Novel Coronavirus, NAA ([facility])  Result Value Ref Range   SARS-CoV-2, NAA Not Detected Not Detected      Assessment & Plan:   Problem List Items Addressed This Visit    None    Visit Diagnoses    Upper respiratory tract infection, unspecified type    -  Primary   Resolved without lingering issues. F/u if sxs return       Follow up plan: Return for as scheduled.

## 2018-11-16 ENCOUNTER — Other Ambulatory Visit: Payer: BC Managed Care – PPO

## 2018-11-16 ENCOUNTER — Other Ambulatory Visit: Payer: Self-pay

## 2018-11-16 DIAGNOSIS — R300 Dysuria: Secondary | ICD-10-CM | POA: Diagnosis not present

## 2018-11-17 ENCOUNTER — Ambulatory Visit (INDEPENDENT_AMBULATORY_CARE_PROVIDER_SITE_OTHER): Payer: BC Managed Care – PPO | Admitting: Nurse Practitioner

## 2018-11-17 ENCOUNTER — Encounter: Payer: Self-pay | Admitting: Nurse Practitioner

## 2018-11-17 DIAGNOSIS — N390 Urinary tract infection, site not specified: Secondary | ICD-10-CM | POA: Insufficient documentation

## 2018-11-17 DIAGNOSIS — N3001 Acute cystitis with hematuria: Secondary | ICD-10-CM

## 2018-11-17 MED ORDER — NITROFURANTOIN MONOHYD MACRO 100 MG PO CAPS: 100.0000 mg | ORAL_CAPSULE | Freq: Two times a day (BID) | ORAL | 0 refills | Status: DC

## 2018-11-17 NOTE — Assessment & Plan Note (Signed)
Acute with UA noting 1+ BLD and 1+ LEU and mod bacteria.  Script for [employer] sent, will adjust regimen if required upon return of culture.  Recommend continued Vit C daily and increased fluid intake during this time.  If continued recurrence consider treatment for vaginal atrophy, discussed with patient.  Return if worsening or continued symptoms.

## 2018-11-17 NOTE — Progress Notes (Signed)
LMP 02/09/2017 (Within Weeks)    Subjective:    Patient ID: Cathy Mitchell, female    DOB: May 25, 1965, 53 y.o.   MRN: [id number]  HPI: Cathy Mitchell is a 53 y.o. female  Chief Complaint  Patient presents with  . Urinary Tract Infection    pt states she has had urinary urgency recently     . This visit was completed via Doximity due to the restrictions of the COVID-19 pandemic. All issues as above were discussed and addressed. Physical exam was done as above through visual confirmation on Doximity. If it was felt that the patient should be evaluated in the office, they were directed there. The patient verbally consented to this visit. . Location of the patient: home . Location of the provider: home . Those involved with this call:  . Provider: Marnee Guarneri, DNP . CMA: Yvonna Alanis, CMA . Front Desk/Registration: Jill Side  . Time spent on call: 15 minutes with patient face to face via video conference. More than 50% of this time was spent in counseling and coordination of care. 10 minutes total spent in review of patient's record and preparation of their chart.  . I verified patient identity using two factors (patient name and date of birth). Patient consents verbally to being seen via telemedicine visit today.    URINARY SYMPTOMS Started having urgency yesterday and reports if she does not jump on them then she will start to get severe back pain.  She reports this is her third UTI this year, prior to this her previous UTI was 2014.  States her last was 3 months ago, has been treated with Macrobid in past. Dysuria: no Urinary frequency: no Urgency: yes Small volume voids: yes Symptom severity: yes Urinary incontinence: no Foul odor: no Hematuria: no Abdominal pain: no Back pain: no Suprapubic pain/pressure: no Flank pain: no Fever:  no Vomiting: no Relief with cranberry juice: not taking Relief with pyridium: not taking Status: stable Previous urinary tract  infection: yes Recurrent urinary tract infection: has had three UTI's Sexual activity: No sexually active/monogomous/practicing safe sex History of sexually transmitted disease: no Treatments attempted: increasing fluids   Relevant past medical, surgical, family and social history reviewed and updated as indicated. Interim medical history since our last visit reviewed. Allergies and medications reviewed and updated.  Review of Systems  Constitutional: Negative for activity change, appetite change, diaphoresis, fatigue and fever.  Respiratory: Negative for cough, chest tightness and shortness of breath.   Cardiovascular: Negative for chest pain, palpitations and leg swelling.  Gastrointestinal: Negative for abdominal distention, abdominal pain, constipation, diarrhea, nausea and vomiting.  Genitourinary: Positive for decreased urine volume and urgency. Negative for dysuria, frequency and hematuria.  Psychiatric/Behavioral: Negative.     Per HPI unless specifically indicated above     Objective:    LMP 02/09/2017 (Within Weeks)   Wt Readings from Last 3 Encounters:  09/05/18 280 lb (127 kg)  07/20/18 259 lb (117.5 kg)  03/07/18 259 lb (117.5 kg)    Physical Exam Vitals signs and nursing note reviewed.  Constitutional:      General: She is awake. She is not in acute distress.    Appearance: She is well-developed. She is not ill-appearing.  HENT:     Head: Normocephalic.     Right Ear: Hearing normal.     Left Ear: Hearing normal.  Eyes:     General: Lids are normal.        Right eye: No discharge.  Left eye: No discharge.     Conjunctiva/sclera: Conjunctivae normal.  Neck:     Musculoskeletal: Normal range of motion.  Cardiovascular:     Comments: Unable to auscultate due to virtual exam only  Pulmonary:     Effort: Pulmonary effort is normal. No accessory muscle usage or respiratory distress.     Comments: Unable to auscultate due to virtual exam only   Abdominal:     Palpations: Abdomen is soft.     Tenderness: There is abdominal tenderness in the suprapubic area.     Comments: Unable to auscultate due to virtual exam only.  Patient reports mild suprapubic tenderness on self palpation.   Neurological:     Mental Status: She is alert and oriented to person, place, and time.  Psychiatric:        Attention and Perception: Attention normal.        Mood and Affect: Mood normal.        Behavior: Behavior normal. Behavior is cooperative.        Thought Content: Thought content normal.        Judgment: Judgment normal.     Results for orders placed or performed in visit on 11/16/18  Microscopic Examination   URINE  Result Value Ref Range   WBC, UA >30 (A) 0 - 5 /hpf   RBC 3-10 (A) 0 - 2 /hpf   Epithelial Cells (non renal) 0-10 0 - 10 /hpf   Bacteria, UA Moderate (A) None seen/Few  Urine Culture, Reflex   URINE  Result Value Ref Range   Urine Culture, Routine WILL FOLLOW   UA/M w/rflx Culture, Routine   Specimen: Urine   URINE  Result Value Ref Range   Specific Gravity, UA 1.020 1.005 - 1.030   pH, UA 5.5 5.0 - 7.5   Color, UA Yellow Yellow   Appearance Ur Cloudy (A) Clear   Leukocytes,UA 1+ (A) Negative   Protein,UA Negative Negative/Trace   Glucose, UA Negative Negative   Ketones, UA Negative Negative   RBC, UA 1+ (A) Negative   Bilirubin, UA Negative Negative   Urobilinogen, Ur 0.2 0.2 - 1.0 mg/dL   Nitrite, UA Negative Negative   Microscopic Examination See below:    Urinalysis Reflex Comment       Assessment & Plan:   Problem List Items Addressed This Visit      Genitourinary   Urinary tract infection    Acute with UA noting 1+ BLD and 1+ LEU and mod bacteria.  Script for [employer] sent, will adjust regimen if required upon return of culture.  Recommend continued Vit C daily and increased fluid intake during this time.  If continued recurrence consider treatment for vaginal atrophy, discussed with patient.  Return  if worsening or continued symptoms.      Relevant Medications   nitrofurantoin, macrocrystal-monohydrate, (MACROBID) 100 MG capsule      I discussed the assessment and treatment plan with the patient. The patient was provided an opportunity to ask questions and all were answered. The patient agreed with the plan and demonstrated an understanding of the instructions.   The patient was advised to call back or seek an in-person evaluation if the symptoms worsen or if the condition fails to improve as anticipated.   I provided 15 minutes of time during this encounter.  Follow up plan: Return if symptoms worsen or fail to improve.

## 2018-11-17 NOTE — Patient Instructions (Signed)
Urinary Tract Infection, Adult A urinary tract infection (UTI) is an infection of any part of the urinary tract. The urinary tract includes:  The kidneys.  The ureters.  The bladder.  The urethra. These organs make, store, and get rid of pee (urine) in the body. What are the causes? This is caused by germs (bacteria) in your genital area. These germs grow and cause swelling (inflammation) of your urinary tract. What increases the risk? You are more likely to develop this condition if:  You have a small, thin tube (catheter) to drain pee.  You cannot control when you pee or poop (incontinence).  You are female, and: ? You use these methods to prevent pregnancy: ? A medicine that kills sperm (spermicide). ? A device that blocks sperm (diaphragm). ? You have low levels of a female hormone (estrogen). ? You are pregnant.  You have genes that add to your risk.  You are sexually active.  You take antibiotic medicines.  You have trouble peeing because of: ? A prostate that is bigger than normal, if you are female. ? A blockage in the part of your body that drains pee from the bladder (urethra). ? A kidney stone. ? A nerve condition that affects your bladder (neurogenic bladder). ? Not getting enough to drink. ? Not peeing often enough.  You have other conditions, such as: ? Diabetes. ? A weak disease-fighting system (immune system). ? Sickle cell disease. ? Gout. ? Injury of the spine. What are the signs or symptoms? Symptoms of this condition include:  Needing to pee right away (urgently).  Peeing often.  Peeing small amounts often.  Pain or burning when peeing.  Blood in the pee.  Pee that smells bad or not like normal.  Trouble peeing.  Pee that is cloudy.  Fluid coming from the vagina, if you are female.  Pain in the belly or lower back. Other symptoms include:  Throwing up (vomiting).  No urge to eat.  Feeling mixed up (confused).  Being tired  and grouchy (irritable).  A fever.  Watery poop (diarrhea). How is this treated? This condition may be treated with:  Antibiotic medicine.  Other medicines.  Drinking enough water. Follow these instructions at home:  Medicines  Take over-the-counter and prescription medicines only as told by your doctor.  If you were prescribed an antibiotic medicine, take it as told by your doctor. Do not stop taking it even if you start to feel better. General instructions  Make sure you: ? Pee until your bladder is empty. ? Do not hold pee for a long time. ? Empty your bladder after sex. ? Wipe from front to back after pooping if you are a female. Use each tissue one time when you wipe.  Drink enough fluid to keep your pee pale yellow.  Keep all follow-up visits as told by your doctor. This is important. Contact a doctor if:  You do not get better after 1-2 days.  Your symptoms go away and then come back. Get help right away if:  You have very bad back pain.  You have very bad pain in your lower belly.  You have a fever.  You are sick to your stomach (nauseous).  You are throwing up. Summary  A urinary tract infection (UTI) is an infection of any part of the urinary tract.  This condition is caused by germs in your genital area.  There are many risk factors for a UTI. These include having a small, thin   tube to drain pee and not being able to control when you pee or poop.  Treatment includes antibiotic medicines for germs.  Drink enough fluid to keep your pee pale yellow. This information is not intended to replace advice given to you by your health care provider. Make sure you discuss any questions you have with your health care provider. Document Released: 09/02/2007 Document Revised: 03/03/2018 Document Reviewed: 09/23/2017 Elsevier Patient Education  2020 [employer]  

## 2018-11-19 LAB — UA/M W/RFLX CULTURE, ROUTINE
Bilirubin, UA: NEGATIVE
Glucose, UA: NEGATIVE
Ketones, UA: NEGATIVE
Nitrite, UA: NEGATIVE
Protein,UA: NEGATIVE
Specific Gravity, UA: 1.02 (ref 1.005–1.030)
Urobilinogen, Ur: 0.2 mg/dL (ref 0.2–1.0)
pH, UA: 5.5 (ref 5.0–7.5)

## 2018-11-19 LAB — MICROSCOPIC EXAMINATION: WBC, UA: >30 /hpf — AB (ref 0–5)

## 2018-11-19 LAB — URINE CULTURE, REFLEX

## 2018-11-21 ENCOUNTER — Other Ambulatory Visit: Payer: Self-pay

## 2018-11-21 ENCOUNTER — Other Ambulatory Visit: Payer: Self-pay | Admitting: Nurse Practitioner

## 2018-11-21 MED ORDER — AMOXICILLIN-POT CLAVULANATE 500-125 MG PO TABS: 1.0000 | ORAL_TABLET | Freq: Two times a day (BID) | ORAL | 0 refills | Status: AC

## 2018-11-21 MED ORDER — DILTIAZEM HCL ER COATED BEADS 120 MG PO CP24: 120.0000 mg | ORAL_CAPSULE | Freq: Every day | ORAL | 3 refills | Status: DC

## 2018-11-21 NOTE — Progress Notes (Signed)
Change from Macrobid to Augmentin based on culture results.

## 2019-01-19 ENCOUNTER — Ambulatory Visit (INDEPENDENT_AMBULATORY_CARE_PROVIDER_SITE_OTHER): Payer: BC Managed Care – PPO | Admitting: Cardiovascular Disease

## 2019-01-19 ENCOUNTER — Other Ambulatory Visit: Payer: Self-pay

## 2019-01-19 ENCOUNTER — Encounter: Payer: Self-pay | Admitting: Cardiovascular Disease

## 2019-01-19 VITALS — BP 120/74 | HR 77 | Temp 97.0°F | Ht 67.5 in | Wt 284.8 lb

## 2019-01-19 DIAGNOSIS — I493 Ventricular premature depolarization: Secondary | ICD-10-CM | POA: Diagnosis not present

## 2019-01-19 MED ORDER — DILTIAZEM HCL ER COATED BEADS 180 MG PO CP24: 180.0000 mg | ORAL_CAPSULE | Freq: Every day | ORAL | 3 refills | Status: DC

## 2019-01-19 NOTE — Progress Notes (Signed)
Cardiology Office Note   Date:  01/19/2019   ID:  Cathy Mitchell, DOB 07/20/1965, MRN [id number]  PCP:  Venita Lick, NP  Cardiologist:   Kathlyn Sacramento, MD   Chief Complaint  Patient presents with  . othe r    6 month follow up. Meds reviewed by the pt. verbally. Pt. c/o occas. PVC's.       History of Present Illness: Cathy Mitchell is a 53 y.o. female who is here for a follow-up visit regarding frequent symptomatic PVCs. She underwent a treadmill stress test in September 2016 before having gastric bypass surgery.  The stress test was normal.  She has no history of diabetes or hyperlipidemia. She does have sleep apnea and refuses CPAP.  She has no family history of premature coronary artery disease.  She is not a smoker and does not consume excessive amount of caffeine.    Holter monitor in December 2018 showed very frequent PVCs with a total of 24,000 beats in 48 hours representing 12% burden. Echocardiogram showed normal LV systolic function with mild mitral regurgitation.   Had PVCs responded better to diltiazem and metoprolol.   Repeat Holter monitor showed 5000 PVCs in 24 hours representing a 5% burden.   She has been doing reasonably well overall with no recent chest pain or shortness of breath.  She continues to complain of palpitations but not as frequent as before and mostly happening at night.  She did gain 26 pounds over the last 6 months and she is trying to lose again.    Past Medical History:  Diagnosis Date  . Anxiety   . Arthritis   . Fibromyalgia   . Hypertension    no longer has hypertension  . Neuromuscular disorder ([place])    L-sided tremors  . Sleep apnea    does not use C-PAP    Past Surgical History:  Procedure Laterality Date  . CARPAL TUNNEL RELEASE Bilateral 2007  . DILATION AND CURETTAGE OF UTERUS    . GANGLION CYST EXCISION Bilateral   . GANGLION CYST EXCISION Right 10/08/2015   Procedure: REMOVAL GANGLION OF WRIST;  Surgeon: Hessie Knows, MD;  Location: [facility] OR[facility];  Service: Orthopedics;  Laterality: Right;  . GASTRIC BYPASS    . HAND SURGERY Bilateral Resection  . HYSTEROSCOPY W/D&C N/A 05/24/2017   Procedure: DILATATION AND CURETTAGE /HYSTEROSCOPY;  Surgeon: Mitchell, Cathy Slim, MD;  Location: [facility] OR[facility];  Service: Gynecology;  Laterality: N/A;  . INCONTINENCE SURGERY  2009  . LEEP N/A 05/24/2017   Procedure: LOOP ELECTROSURGICAL EXCISION PROCEDURE (LEEP);  Surgeon: Brayton Mars, MD;  Location: [facility] OR[facility];  Service: Gynecology;  Laterality: N/A;  . TUBAL LIGATION       Current Outpatient Medications  Medication Sig Dispense Refill  . acetaminophen (TYLENOL) 500 MG tablet Take 1,500 mg by mouth daily as needed for mild pain.    . Ascorbic Acid (VITAMIN C) 1000 MG tablet Take 1,000 mg by mouth daily.    . cetirizine (ZYRTEC) 10 MG tablet Take 1 tablet (10 mg total) by mouth daily. 30 tablet 11  . cholecalciferol (VITAMIN D) 1000 units tablet Take 1,000 Units by mouth daily.    . cyanocobalamin 1000 MCG tablet Take 1,000 mcg by mouth daily.    . diclofenac sodium (VOLTAREN) 1 % GEL Apply 2 g topically daily as needed (pain).    Marland Kitchen diltiazem (CARDIZEM CD) 180 MG 24 hr capsule Take 1 capsule (180 mg total) by mouth daily.  90 capsule 3  . fluticasone (FLONASE) 50 MCG/ACT nasal spray Place 2 sprays into both nostrils daily as needed for allergies. 16 g 6  . glucosamine-chondroitin 500-400 MG tablet Take 1 tablet by mouth daily.    . Multiple Minerals-Vitamins (CAL-MAG-ZINC-D PO) Take 1 tablet by mouth daily.    . Multiple Vitamins-Minerals (MULTIPLE VITAMINS/WOMENS) tablet Take 2 tablets by mouth daily. women's 50+    . Potassium 99 MG TABS Take 99 mg by mouth daily.    . Zinc 50 MG TABS Take 1 tablet by mouth daily.     No current facility-administered medications for this visit.     Allergies:   Oxycodone-acetaminophen, Nsaids, Other, and Percocet [oxycodone-acetaminophen]    Social History:  The patient   reports that she quit smoking about 3 years ago. Her smoking use included cigarettes. She has a 14.50 pack-year smoking history. She has never used smokeless tobacco. She reports that she does not drink alcohol or use drugs.   Family History:  The patient's family history includes COPD in her father; Diabetes in her paternal grandfather; Eczema in her son; Heart disease in her maternal grandfather and paternal grandfather; Thyroid disease in her daughter.    ROS:  Please see the history of present illness.   Otherwise, review of systems are positive for none.   All other systems are reviewed and negative.    PHYSICAL EXAM: VS:  BP 120/74 (BP Location: Left Arm, Patient Position: Sitting, Cuff Size: Large)   Pulse 77   Temp (!) 97 F (36.1 C)   Ht 5' 7.5" (1.715 m)   Wt 284 lb 12 oz (129.2 kg)   LMP 02/09/2017 (Within Weeks)   BMI 43.94 kg/m  , BMI Body mass index is 43.94 kg/m. GEN: Well nourished, well developed, in no acute distress  HEENT: normal  Neck: no JVD, carotid bruits, or masses Cardiac: RRR with premature beats; no murmurs, rubs, or gallops,no edema  Respiratory:  clear to auscultation bilaterally, normal work of breathing GI: soft, nontender, nondistended, + BS MS: no deformity or atrophy  Skin: warm and dry, no rash Neuro:  Strength and sensation are intact Psych: euthymic mood, full affect   EKG:  EKG is ordered today. The ekg ordered today demonstrates sinus rhythm with frequent PVCs.  Incomplete right bundle branch block.  Recent Labs: 03/08/2018: Creatinine, Ser 0.53; Platelets 365 03/09/2018: ALT 20; BUN 11; Hemoglobin 13.0; Potassium 4.5; Sodium 142    Lipid Panel    Component Value Date/Time   CHOL 166 03/09/2018 1435   CHOL 145 07/31/2016 1110   TRIG 101 03/09/2018 1435   HDL 56 03/09/2018 1435   HDL 48 07/31/2016 1110   CHOLHDL 4.3 12/03/2014 1619   VLDL 27 12/03/2014 1619   LDLCALC 90 03/09/2018 1435   LDLCALC 73 07/31/2016 1110      Wt  Readings from Last 3 Encounters:  01/19/19 284 lb 12 oz (129.2 kg)  09/05/18 280 lb (127 kg)  07/20/18 259 lb (117.5 kg)       PAD Screen 03/02/2017  Previous PAD dx? No  Previous surgical procedure? No  Pain with walking? No  Feet/toe relief with dangling? No  Painful, non-healing ulcers? No  Extremities discolored? No      ASSESSMENT AND PLAN:  1. Symptomatic PVCs: Reasonably controlled with diltiazem but she continues to have symptoms mostly at night.  I elected to increase diltiazem extended release to 180 mg once daily.  2. Sleep apnea: She reports intolerance  to CPAP.  3.  Obesity: She gained 26 pounds in the last 6 months and I discussed with her the importance of getting back on track.  She did have weight loss surgery in 2016.   Disposition:   FU with me in 6 months  Signed,  Kathlyn Sacramento, MD  01/19/2019 3:52 PM    [place]

## 2019-01-19 NOTE — Patient Instructions (Signed)
Medication Instructions:  Your physician has recommended you make the following change in your medication:   INCREASE Diltiazem to 180mg  daily. An Rx has been sent to your pharmacy.  *If you need a refill on your cardiac medications before your next appointment, please call your pharmacy*  Lab Work: None ordered If you have labs (blood work) drawn today and your tests are completely normal, you will receive your results only by: Marland Kitchen MyChart Message (if you have MyChart) OR . A paper copy in the mail If you have any lab test that is abnormal or we need to change your treatment, we will call you to review the results.  Testing/Procedures: None ordered  Follow-Up: At [facility], you and your health needs are our priority.  As part of our continuing mission to provide you with exceptional heart care, we have created designated Provider Care Teams.  These Care Teams include your primary Cardiologist (physician) and Advanced Practice Providers (APPs -  Physician Assistants and Nurse Practitioners) who all work together to provide you with the care you need, when you need it.  Your next appointment:   12 months  The format for your next appointment:   In Person  Provider:    You may see Kathlyn Sacramento, MD or one of the following Advanced Practice Providers on your designated Care Team:    Murray Hodgkins, NP  Christell Faith, PA-C  Marrianne Mood, PA-C   Other Instructions N/A

## 2019-03-17 ENCOUNTER — Encounter: Payer: Self-pay | Admitting: Nurse Practitioner

## 2019-03-17 ENCOUNTER — Other Ambulatory Visit: Payer: Self-pay | Admitting: Nurse Practitioner

## 2019-03-17 MED ORDER — CHOLECALCIFEROL 1.25 MG (50000 UT) PO TABS: 1.0000 | ORAL_TABLET | ORAL | 1 refills | Status: DC

## 2019-03-17 MED ORDER — CELECOXIB 100 MG PO CAPS: 100.0000 mg | ORAL_CAPSULE | ORAL | 0 refills | Status: DC | PRN

## 2019-03-17 NOTE — Progress Notes (Signed)
Spoke to patient on telephone and reviewed labs sent over from [facility].  GFR in 100's stable and Vitamin D level remains low at 26.9 with daily Vit D.  Will send in weekly dosing.  She also requests a few days of antiinflammatory until her appointment next week.  Due to HTN discussed with her to use minimally.  She is not allergic to NSAIDS, only was told not to take often due to past CKD from taking all the time for her arthritis.

## 2019-03-21 ENCOUNTER — Other Ambulatory Visit: Payer: Self-pay

## 2019-03-21 ENCOUNTER — Ambulatory Visit (INDEPENDENT_AMBULATORY_CARE_PROVIDER_SITE_OTHER): Payer: BC Managed Care – PPO | Admitting: Family Medicine

## 2019-03-21 ENCOUNTER — Encounter: Payer: Self-pay | Admitting: Family Medicine

## 2019-03-21 VITALS — Wt 280.0 lb

## 2019-03-21 DIAGNOSIS — M170 Bilateral primary osteoarthritis of knee: Secondary | ICD-10-CM

## 2019-03-21 DIAGNOSIS — M1611 Unilateral primary osteoarthritis, right hip: Secondary | ICD-10-CM

## 2019-03-21 MED ORDER — CELECOXIB 100 MG PO CAPS: 100.0000 mg | ORAL_CAPSULE | Freq: Every day | ORAL | 1 refills | Status: DC | PRN

## 2019-03-21 NOTE — Assessment & Plan Note (Signed)
Will continue rare prn use of celebrex, continue more frequent usage of diclofenac gel and tylenol for majority of pain sxs. Weight loss, activity reviewed

## 2019-03-21 NOTE — Progress Notes (Signed)
Wt 280 lb (127 kg)   LMP 02/09/2017 (Within Weeks)   BMI 43.21 kg/m    Subjective:    Patient ID: Cathy Mitchell, female    DOB: October 02, 1965, 53 y.o.   MRN: [id number]  HPI: Cathy Mitchell is a 53 y.o. female  Chief Complaint  Patient presents with  . Hip Pain    right. x a few months  . Knee Pain    billateral     . This visit was completed via WebEx due to the restrictions of the COVID-19 pandemic. All issues as above were discussed and addressed. Physical exam was done as above through visual confirmation on WebEx. If it was felt that the patient should be evaluated in the office, they were directed there. The patient verbally consented to this visit. . Location of the patient: home . Location of the provider: home . Those involved with this call:  . Provider: Merrie Roof, PA-C . CMA: Lesle Chris, [place] . Front Desk/Registration: Jill Side  . Time spent on call: 15 minutes with patient face to face via video conference. More than 50% of this time was spent in counseling and coordination of care. 5 minutes total spent in review of patient's record and preparation of their chart. I verified patient identity using two factors (patient name and date of birth). Patient consents verbally to being seen via telemedicine visit today.   Patient presenting today for right hip pain and b/l knee pain ongoing but worsening due to winter time weather changes. Known hx of OA in these areas, on tylenol and diclofenac gel but still having significant breakthrough sxs. Avoids NSAIDs due to several years of mild CKD but recent labs showed improved renal function so she was told by PCP she could have some celebrex here and there. This has worked very well when she's taken it, tries to take rarely. Denies joint redness, fevers, new injuries. States home BPs have continued to be 120s/70s when checked despite medication.    Relevant past medical, surgical, family and social history reviewed and updated  as indicated. Interim medical history since our last visit reviewed. Allergies and medications reviewed and updated.  Review of Systems  Per HPI unless specifically indicated above     Objective:    Wt 280 lb (127 kg)   LMP 02/09/2017 (Within Weeks)   BMI 43.21 kg/m   Wt Readings from Last 3 Encounters:  03/21/19 280 lb (127 kg)  01/19/19 284 lb 12 oz (129.2 kg)  09/05/18 280 lb (127 kg)    Physical Exam Vitals and nursing note reviewed.  Constitutional:      General: She is not in acute distress.    Appearance: Normal appearance.  HENT:     Head: Atraumatic.     Right Ear: External ear normal.     Left Ear: External ear normal.     Nose: Nose normal. No congestion.     Mouth/Throat:     Mouth: Mucous membranes are moist.     Pharynx: Oropharynx is clear. No posterior oropharyngeal erythema.  Eyes:     Extraocular Movements: Extraocular movements intact.     Conjunctiva/sclera: Conjunctivae normal.  Cardiovascular:     Comments: Unable to assess via virtual visit Pulmonary:     Effort: Pulmonary effort is normal. No respiratory distress.  Musculoskeletal:        General: Normal range of motion.     Cervical back: Normal range of motion.  Skin:  General: Skin is dry.     Findings: No erythema.  Neurological:     Mental Status: She is alert and oriented to person, place, and time.  Psychiatric:        Mood and Affect: Mood normal.        Thought Content: Thought content normal.        Judgment: Judgment normal.     Results for orders placed or performed in visit on 11/16/18  Microscopic Examination   URINE  Result Value Ref Range   WBC, UA >30 (A) 0 - 5 /hpf   RBC 3-10 (A) 0 - 2 /hpf   Epithelial Cells (non renal) 0-10 0 - 10 /hpf   Bacteria, UA Moderate (A) None seen/Few  Urine Culture, Reflex   URINE  Result Value Ref Range   Urine Culture, Routine Final report (A)    Organism ID, Bacteria Klebsiella pneumoniae (A)    Antimicrobial Susceptibility  Comment   UA/M w/rflx Culture, Routine   Specimen: Urine   URINE  Result Value Ref Range   Specific Gravity, UA 1.020 1.005 - 1.030   pH, UA 5.5 5.0 - 7.5   Color, UA Yellow Yellow   Appearance Ur Cloudy (A) Clear   Leukocytes,UA 1+ (A) Negative   Protein,UA Negative Negative/Trace   Glucose, UA Negative Negative   Ketones, UA Negative Negative   RBC, UA 1+ (A) Negative   Bilirubin, UA Negative Negative   Urobilinogen, Ur 0.2 0.2 - 1.0 mg/dL   Nitrite, UA Negative Negative   Microscopic Examination See below:    Urinalysis Reflex Comment       Assessment & Plan:   Problem List Items Addressed This Visit      Musculoskeletal and Integument   Osteoarthritis of knee (Bilateral) (R>L) - Primary (Chronic)    Will continue rare prn use of celebrex, continue more frequent usage of diclofenac gel and tylenol for majority of pain sxs. Weight loss, activity reviewed      Relevant Medications   celecoxib (CELEBREX) 100 MG capsule   Osteoarthritis of hip (Right) (Chronic)    Will continue rare prn use of celebrex, continue more frequent usage of diclofenac gel and tylenol for majority of pain sxs. Weight loss, activity reviewed      Relevant Medications   celecoxib (CELEBREX) 100 MG capsule       Follow up plan: Return for CPE - patient will call to schedule when ready.

## 2019-04-25 DIAGNOSIS — H6690 Otitis media, unspecified, unspecified ear: Secondary | ICD-10-CM | POA: Diagnosis not present

## 2019-04-30 ENCOUNTER — Encounter: Payer: Self-pay | Admitting: Nurse Practitioner

## 2019-05-01 ENCOUNTER — Ambulatory Visit: Payer: Self-pay | Admitting: *Deleted

## 2019-05-01 ENCOUNTER — Telehealth: Payer: Self-pay | Admitting: Nurse Practitioner

## 2019-05-01 NOTE — Telephone Encounter (Signed)
Pt called stating she is having a hard time breathing for the past few weeks; she had meclizine, and amoxicillin; she says that she has SOB with exertion; the pt says her lung on the left side still hurts; the pt says she needs an xray; the pt says she has a "wet cough"; the pt states that she would like to have a chest x-ray; recommendations made per nurse triage protocol; she sees [employer]; pt transferred to [place] for scheduling.   Reason for Disposition . [1] MILD difficulty breathing (e.g., minimal/no SOB at rest, SOB with walking, pulse <100) AND [2] NEW-onset or WORSE than normal  Answer Assessment - Initial Assessment Questions 1. RESPIRATORY STATUS: "Describe your breathing?" (e.g., wheezing, shortness of breath, unable to speak, severe coughing)      Shortness of breath with exertion 2. ONSET: "When did this breathing problem begin?"     A few weeks ago 3. PATTERN "Does the difficult breathing come and go, or has it been constant since it started?"      intermittent 4. SEVERITY: "How bad is your breathing?" (e.g., mild, moderate, severe)    - MILD: No SOB at rest, mild SOB with walking, speaks normally in sentences, can lay down, no retractions, pulse < 100.    - MODERATE: SOB at rest, SOB with minimal exertion and prefers to sit, cannot lie down flat, speaks in phrases, mild retractions, audible wheezing, pulse 100-120.    - SEVERE: Very SOB at rest, speaks in single words, struggling to breathe, sitting hunched forward, retractions, pulse > 120      mild 5. RECURRENT SYMPTOM: "Have you had difficulty breathing before?" If so, ask: "When was the last time?" and "What happened that time?"      Ongoing for the past few weeks 6. CARDIAC HISTORY: "Do you have any history of heart disease?" (e.g., heart attack, angina, bypass surgery, angioplasty)     PVCs 7. LUNG HISTORY: "Do you have any history of lung disease?"  (e.g., pulmonary embolus, asthma, emphysema)      8. CAUSE: "What do you think is causing the breathing problem?"    ? Bronchitis or pneumonia 9. OTHER SYMPTOMS: "Do you have any other symptoms? (e.g., dizziness, runny nose, cough, chest pain, fever)     "wet cough" 10. PREGNANCY: "Is there any chance you are pregnant?" "When was your last menstrual period?"   no 11. TRAVEL: "Have you traveled out of the country in the last month?" (e.g., travel history, exposures)  Protocols used: BREATHING DIFFICULTY-A-AH

## 2019-05-01 NOTE — Telephone Encounter (Signed)
Routing to provider  

## 2019-05-01 NOTE — Telephone Encounter (Signed)
Noted, she is scheduled to be seen tomorrow.

## 2019-05-01 NOTE — Telephone Encounter (Signed)
Copied from [place] [id number]. Topic: General - Other >> May 01, 2019 11:00 AM Ke[facility] Breath wrote: Reason for CRM: Patient is calling to request an x-ray of her lungs.  She sent an email to the doctor yesterday and still has not gotten a response back.  Please call patient to confirm message and get a referral for an x-ray.  Patient states that she does not have COVID and no fever.

## 2019-05-02 ENCOUNTER — Other Ambulatory Visit: Payer: Self-pay

## 2019-05-02 ENCOUNTER — Telehealth (INDEPENDENT_AMBULATORY_CARE_PROVIDER_SITE_OTHER): Payer: BC Managed Care – PPO | Admitting: Nurse Practitioner

## 2019-05-02 ENCOUNTER — Encounter: Payer: Self-pay | Admitting: Nurse Practitioner

## 2019-05-02 ENCOUNTER — Ambulatory Visit
Admission: RE | Admit: 2019-05-02 | Discharge: 2019-05-02 | Disposition: A | Discharge status: Home / Self Care | Payer: BC Managed Care – PPO | Source: Ambulatory Visit | Attending: Nurse Practitioner | Admitting: Nurse Practitioner

## 2019-05-02 ENCOUNTER — Telehealth: Payer: Self-pay | Admitting: Nurse Practitioner

## 2019-05-02 DIAGNOSIS — R0602 Shortness of breath: Secondary | ICD-10-CM

## 2019-05-02 DIAGNOSIS — R5383 Other fatigue: Secondary | ICD-10-CM | POA: Diagnosis not present

## 2019-05-02 DIAGNOSIS — T7840XD Allergy, unspecified, subsequent encounter: Secondary | ICD-10-CM

## 2019-05-02 DIAGNOSIS — R05 Cough: Secondary | ICD-10-CM | POA: Diagnosis not present

## 2019-05-02 MED ORDER — OLOPATADINE HCL 0.2 % OP SOLN: 1.0000 [drp] | Freq: Every day | OPHTHALMIC | 5 refills | Status: DC

## 2019-05-02 MED ORDER — GUAIFENESIN ER 600 MG PO TB12: 600.0000 mg | ORAL_TABLET | Freq: Two times a day (BID) | ORAL | 0 refills | Status: AC | PRN

## 2019-05-02 MED ORDER — PREDNISONE 10 MG PO TABS: ORAL_TABLET | ORAL | 0 refills | Status: AC

## 2019-05-02 NOTE — Patient Instructions (Signed)
Shortness of Breath, Adult Shortness of breath is when a person has trouble breathing enough air or when a person feels like she or he is having trouble breathing in enough air. Shortness of breath could be a sign of a medical problem. Follow these instructions at home:   Pay attention to any changes in your symptoms.  Do not use any products that contain nicotine or tobacco, such as cigarettes, e-cigarettes, and chewing tobacco.  Do not smoke. Smoking is a common cause of shortness of breath. If you need help quitting, ask your health care provider.  Avoid things that can irritate your airways, such as: ? Mold. ? Dust. ? Air pollution. ? Chemical fumes. ? Things that can cause allergy symptoms (allergens), if you have allergies.  Keep your living space clean and free of mold and dust.  Rest as needed. Slowly return to your usual activities.  Take over-the-counter and prescription medicines only as told by your health care provider. This includes oxygen therapy and inhaled medicines.  Keep all follow-up visits as told by your health care provider. This is important. Contact a health care provider if:  Your condition does not improve as soon as expected.  You have a hard time doing your normal activities, even after you rest.  You have new symptoms. Get help right away if:  Your shortness of breath gets worse.  You have shortness of breath when you are resting.  You feel light-headed or you faint.  You have a cough that is not controlled with medicines.  You cough up blood.  You have pain with breathing.  You have pain in your chest, arms, shoulders, or abdomen.  You have a fever.  You cannot walk up stairs or exercise the way that you normally do. These symptoms may represent a serious problem that is an emergency. Do not wait to see if the symptoms will go away. Get medical help right away. Call your local emergency services (911 in the [place] Do not drive yourself  to the hospital. Summary  Shortness of breath is when a person has trouble breathing enough air. It can be a sign of a medical problem.  Avoid things that irritate your lungs, such as smoking, pollution, mold, and dust.  Pay attention to changes in your symptoms and contact your health care provider if you have a hard time completing daily activities because of shortness of breath. This information is not intended to replace advice given to you by your health care provider. Make sure you discuss any questions you have with your health care provider. Document Revised: 08/16/2017 Document Reviewed: 08/16/2017 Elsevier Patient Education  2020 [employer]  

## 2019-05-02 NOTE — Progress Notes (Signed)
LMP 02/09/2017 (Within Weeks)    Subjective:    Patient ID: Cathy Mitchell Guardian, female    DOB: May 23, 1965, 54 y.o.   MRN: [id number]  HPI: [name] is a 54 y.o. female  Chief Complaint  Patient presents with  . Cough    Ongoing 3-4 weeks. Complains patient left lung hurts. Productive. Just finished amoxicillin.  . Shortness of Breath  . Chest Pain   UPPER RESPIRATORY TRACT INFECTION Onset: 3-4 weeks; started with cold symptoms, then developed ear pain/dizziness, now congested non-productive cough. Worst symptom:  Shortness of breath Fever: no Cough: yes, wet cough Sputum production: no Shortness of breath: yes, left side Wheezing: no  Chest pain: no Chest tightness: no Chest congestion: yes Nasal congestion: no Runny nose: yes Post nasal drip: yes Sneezing: no Sore throat: no Swollen glands: no Sinus pressure: no Headache: no Face pain: no Toothache: no Ear pain: no  Ear pressure: no  Eyes red/itching:no                             Eye drainage/crusting: no  Diarrhea: no  Nausea/Vomiting: no Rash: no Fatigue: yes Sick contacts: no Strep contacts: no  Context: fluctuating - breathing is getting worse Recurrent sinusitis: no Relief with OTC cold/cough medications: no  Treatments attempted: Amoxicillin, Tylenol   Patient states, "I don't have COVID, what I have is a cold."  States it started about 3-4 weeks ago with a runny nose and watery/red eyes.  The next week, it turned to pressure in her ears and dizziness.  She was started on meclizine and started taking amoxicillin.  She has finished the course of amoxicillin and all of her symptoms have now resolved except for the runny nose, cough, chest congestion, and shortness of breath with activity.  She states she does not think this is COVID, but is scheduled to get a test this morning at 9:30am.  Allergies  Allergen Reactions  . Oxycodone-Acetaminophen Itching and Other (See Comments)    Reaction:  Dizziness  and blurred vision   . Other Other (See Comments)    Patient states she is allergic to all narcotics  . Percocet [Oxycodone-Acetaminophen] Nausea And Vomiting and Other (See Comments)    Reaction:  Dizziness and blurred vision  Reaction:  Dizziness and blurred vision    Outpatient Encounter Medications as of 05/02/2019  Medication Sig  . acetaminophen (TYLENOL) 500 MG tablet Take 1,500 mg by mouth daily as needed for mild pain.  . Ascorbic Acid (VITAMIN C) 1000 MG tablet Take 1,000 mg by mouth daily.  Marland Kitchen CALCIUM-MAGNESIUM-ZINC PO Take 1 Dose by mouth daily.  . celecoxib (CELEBREX) 100 MG capsule Take 1 capsule (100 mg total) by mouth daily as needed (as needed once daily for pain).  . cetirizine (ZYRTEC) 10 MG tablet Take 1 tablet (10 mg total) by mouth daily.  . Cholecalciferol 1.25 MG (50000 UT) TABS Take 1 tablet by mouth once a week. For 8 weeks and then stop.  Return to office for lab draw.  . cyanocobalamin 1000 MCG tablet Take 1,000 mcg by mouth daily.  Marland Kitchen diltiazem (CARDIZEM CD) 180 MG 24 hr capsule Take 1 capsule (180 mg total) by mouth daily.  . fluticasone (FLONASE) 50 MCG/ACT nasal spray Place 2 sprays into both nostrils daily as needed for allergies.  Marland Kitchen glucosamine-chondroitin 500-400 MG tablet Take 1 tablet by mouth daily.  . Multiple Vitamins-Minerals (MULTIPLE VITAMINS/WOMENS) tablet Take 2  tablets by mouth daily. women's 50+  . Potassium 99 MG TABS Take 99 mg by mouth daily.  Marland Kitchen guaiFENesin (MUCINEX) 600 MG 12 hr tablet Take 1 tablet (600 mg total) by mouth 2 (two) times daily as needed for to loosen phlegm.  . predniSONE (DELTASONE) 10 MG tablet Take 6 tablets (60 mg total) by mouth daily with breakfast for 1 day, THEN 5 tablets (50 mg total) daily with breakfast for 1 day, THEN 4 tablets (40 mg total) daily with breakfast for 1 day, THEN 3 tablets (30 mg total) daily with breakfast for 1 day, THEN 2 tablets (20 mg total) daily with breakfast for 1 day, THEN 1 tablet (10 mg total)  daily with breakfast for 1 day.  . [DISCONTINUED] diclofenac sodium (VOLTAREN) 1 % GEL Apply 2 g topically daily as needed (pain).  . [DISCONTINUED] Multiple Minerals-Vitamins (CAL-MAG-ZINC-D PO) Take 1 tablet by mouth daily.  . [DISCONTINUED] Zinc 50 MG TABS Take 1 tablet by mouth daily.   No facility-administered encounter medications on file as of 05/02/2019.   Patient Active Problem List   Diagnosis Date Noted  . Urinary tract infection 11/17/2018  . Acute otitis media 03/28/2018  . S/P D&C (status post dilation and curettage) 05/24/2017  . S/P LEEP 05/24/2017  . Postmenopausal vaginal bleeding 04/30/2017  . Obesity (BMI 35.0-39.9 without comorbidity) 03/22/2017  . Occipital headaches (Bilateral) (L>R) 08/27/2016  . Cervicogenic headache (Bilateral) (L>R) 08/27/2016  . Tricompartment osteoarthritis of knee (Right) 08/04/2016  . Osteoarthritis of knee (Right) 08/04/2016  . Osteoarthritis 08/04/2016  . Long term (current) use of opiate analgesic 08/04/2016  . Long term prescription opiate use 08/04/2016  . Opiate use 08/04/2016  . Chronic knee pain (Location of Primary Source of Pain) (Bilateral) (R>L) 08/04/2016  . Osteoarthritis of knee (Bilateral) (R>L) 08/04/2016  . Chronic neck pain (Location of Secondary source of pain) (Bilateral) 08/04/2016  . Osteoarthritis of hip (Right) 08/04/2016  . Eczema of right hand 07/31/2016  . Chronic knee pain (Right) 07/17/2016  . Wheat allergy 02/12/2016  . Chronic hip pain (Location of Tertiary source of pain) (Right) 02/12/2016  . Pain management 02/12/2016  . Shortness of breath 11/13/2015  . Rosacea 04/29/2015  . Numbness 12/03/2014  . Chronic kidney disease, stage 1, normal or increased GFR 10/15/2014  . Sleep apnea 09/11/2014  . Extreme obesity 09/11/2014  . Primary generalized hypertrophic osteoarthrosis 09/10/2014  . Obstructive sleep apnea 09/10/2014  . Chronic pain syndrome 09/10/2014  . Irritable bowel syndrome 09/10/2014  .  Tobacco abuse 09/10/2014  . Acute anxiety 09/10/2014  . Fibromyalgia 09/10/2014   Past Medical History:  Diagnosis Date  . Anxiety   . Arthritis   . Fibromyalgia   . Hypertension    no longer has hypertension  . Neuromuscular disorder ([place])    L-sided tremors  . Sleep apnea    does not use C-PAP   Relevant past medical, surgical, family and social history reviewed and updated as indicated. Interim medical history since our last visit reviewed.   Review of Systems  Constitutional: Positive for activity change and fatigue. Negative for fever.  HENT: Positive for congestion (chest), postnasal drip and rhinorrhea. Negative for ear discharge, ear pain, sinus pressure, sinus pain, sneezing and sore throat.   Eyes: Negative.  Negative for pain, discharge, redness and itching.  Respiratory: Positive for shortness of breath. Negative for chest tightness and wheezing.   Cardiovascular: Negative.  Negative for chest pain and palpitations.  Gastrointestinal: Negative.  Negative for  blood in stool, constipation, diarrhea, nausea and vomiting.  Musculoskeletal: Negative.  Negative for gait problem, myalgias, neck pain and neck stiffness.  Skin: Negative.  Negative for rash.  Neurological: Negative.  Negative for dizziness, tremors, weakness, numbness and headaches.  Psychiatric/Behavioral: Negative.  Negative for agitation and confusion. The patient is not nervous/anxious.     Per HPI unless specifically indicated above     Objective:    LMP 02/09/2017 (Within Weeks)   Wt Readings from Last 3 Encounters:  03/21/19 280 lb (127 kg)  01/19/19 284 lb 12 oz (129.2 kg)  09/05/18 280 lb (127 kg)    Physical Exam  Physical exam unable to be performed due to lack of equipment.  Results for orders placed or performed in visit on 11/16/18  Microscopic Examination   URINE  Result Value Ref Range   WBC, UA >30 (A) 0 - 5 /hpf   RBC 3-10 (A) 0 - 2 /hpf   Epithelial Cells (non renal) 0-10 0 -  10 /hpf   Bacteria, UA Moderate (A) None seen/Few  Urine Culture, Reflex   URINE  Result Value Ref Range   Urine Culture, Routine Final report (A)    Organism ID, Bacteria Klebsiella pneumoniae (A)    Antimicrobial Susceptibility Comment   UA/M w/rflx Culture, Routine   Specimen: Urine   URINE  Result Value Ref Range   Specific Gravity, UA 1.020 1.005 - 1.030   pH, UA 5.5 5.0 - 7.5   Color, UA Yellow Yellow   Appearance Ur Cloudy (A) Clear   Leukocytes,UA 1+ (A) Negative   Protein,UA Negative Negative/Trace   Glucose, UA Negative Negative   Ketones, UA Negative Negative   RBC, UA 1+ (A) Negative   Bilirubin, UA Negative Negative   Urobilinogen, Ur 0.2 0.2 - 1.0 mg/dL   Nitrite, UA Negative Negative   Microscopic Examination See below:    Urinalysis Reflex Comment       Assessment & Plan:   Problem List Items Addressed This Visit      Other   Shortness of breath - Primary    Differentials include bronchitis vs. Pneumonia.  Not likely to be viral at this point, symptoms have been present >3 weeks.  Will start expectorant and steroid taper.  Will f/u on covid test and chest x-ray.  If no pneumonia on chest x-ray, may still start on quinolone given length of symptoms.       Relevant Medications   predniSONE (DELTASONE) 10 MG tablet   guaiFENesin (MUCINEX) 600 MG 12 hr tablet   Other Relevant Orders   DG Chest 2 View       Follow up plan: Return if symptoms worsen or fail to improve.  This visit was completed via telephone due to the restrictions of the COVID-19 pandemic. All issues as above were discussed and addressed but no physical exam was performed. If it was felt that the patient should be evaluated in the office, they were directed there. The patient verbally consented to this visit. Patient was unable to complete an audio/visual visit due to Lack of equipment. . Location of the patient: home . Location of the provider: work . Those involved with this call:   . Provider: Carnella Guadalajara, DNP . CMA: Merilyn Baba, CMA . Front Desk/Registration: Don Perking  . Time spent on call: 18 minutes on the phone discussing health concerns. 30 minutes total spent in review of patient's record and preparation of their chart.

## 2019-05-02 NOTE — Telephone Encounter (Signed)
Sent in eye drops

## 2019-05-02 NOTE — Telephone Encounter (Signed)
I have ordered Pataday eye drops and sent them to [facility] in [place].  Let us know if these are not the correct eye drops.

## 2019-05-02 NOTE — Assessment & Plan Note (Signed)
Differentials include bronchitis vs. Pneumonia.  Not likely to be viral at this point, symptoms have been present >3 weeks.  Will start expectorant and steroid taper.  Will f/u on covid test and chest x-ray.  If no pneumonia on chest x-ray, may still start on quinolone given length of symptoms.

## 2019-05-13 ENCOUNTER — Other Ambulatory Visit: Payer: Self-pay | Admitting: Family Medicine

## 2019-06-30 ENCOUNTER — Other Ambulatory Visit: Payer: Self-pay | Admitting: Nurse Practitioner

## 2019-06-30 NOTE — Telephone Encounter (Signed)
  Requested medications are on the active medication list [place] 8 WEEKS AND COME BACK FOR LABS  Notes to clinic Not Delegated

## 2019-08-02 DIAGNOSIS — R197 Diarrhea, unspecified: Secondary | ICD-10-CM | POA: Diagnosis not present

## 2019-08-07 ENCOUNTER — Encounter: Payer: Self-pay | Admitting: Nurse Practitioner

## 2019-08-07 ENCOUNTER — Ambulatory Visit (INDEPENDENT_AMBULATORY_CARE_PROVIDER_SITE_OTHER): Payer: BC Managed Care – PPO | Admitting: Nurse Practitioner

## 2019-08-07 DIAGNOSIS — M25561 Pain in right knee: Secondary | ICD-10-CM | POA: Diagnosis not present

## 2019-08-07 DIAGNOSIS — K580 Irritable bowel syndrome with diarrhea: Secondary | ICD-10-CM | POA: Diagnosis not present

## 2019-08-07 MED ORDER — DICYCLOMINE HCL 20 MG PO TABS: 20.0000 mg | ORAL_TABLET | Freq: Three times a day (TID) | ORAL | 4 refills | Status: DC

## 2019-08-07 NOTE — Progress Notes (Signed)
LMP 02/09/2017 (Within Weeks)    Subjective:    Patient ID: Cathy Mitchell, female    DOB: 02-19-1966, 54 y.o.   MRN: [id number]  HPI: Cathy Mitchell is a 54 y.o. female  Chief Complaint  Patient presents with  . Gas    pt states she has been having trouble with excessive gas lately   . Knee Pain    pt states she has been having terrible R knee pain with her torn meniscus, sees ortho on 08/16/19    . This visit was completed via telephone due to the restrictions of the COVID-19 pandemic. All issues as above were discussed and addressed but no physical exam was performed. If it was felt that the patient should be evaluated in the office, they were directed there. The patient verbally consented to this visit. Patient was unable to complete an audio/visual visit due to Lack of equipment. Due to the catastrophic nature of the COVID-19 pandemic, this visit was done through audio contact only. . Location of the patient: home . Location of the provider: work . Those involved with this call:  . Provider: Marnee Guarneri, DNP . CMA: Yvonna Alanis, CMA . Front Desk/Registration: Don Perking  . Time spent on call: 15 minutes on the phone discussing health concerns. 10 minutes total spent in review of patient's record and preparation of their chart.  . I verified patient identity using two factors (patient name and date of birth). Patient consents verbally to being seen via telemedicine visit today.    GAS PAIN Has history of gastric bypass March 18, 2015.  Has been on Bentyl 10 MG BID, was provided 15 of these from an NP at her husband's work. Started this about one week ago.  She reports "walking farts" and diarrhea daily, states with Bentyl it is lumpy and not pure liquid anymore, but still there.  States the Bentyl allows her to get out of house for short period.  Is unable to eat salads due to irritable bowel symptoms, which frustrates her as she likes salads.  Has become issue  over past 3 months and over past 6 weeks can not leave house.  Bentyl allows her to leave house for 3 hours.   Duration:weeks Onset: sudden Severity: mild Quality: cramping Location:  diffuse  Radiation: no Frequency: intermittent Alleviating factors: Bentyl Aggravating factors: certain foods -- salad Status: fluctuating Treatments attempted: none Fever: no Nausea: yes Vomiting: no Weight loss: no Decreased appetite: no Diarrhea: yes Constipation: no Blood in stool: no Heartburn: no Jaundice: no Recurrent NSAID use: no   KNEE PAIN Has torn meniscus right knee and sees ortho 08/16/19.  She is dealing with pain and is trying home Celebrex as needed, not often.  Takes Tylenol as needed.  Can not take narcotics. Duration: weeks Involved knee: right Mechanism of injury: unknown Location:anterior Onset: gradual Severity: moderate  Quality:  dull and aching Frequency: intermittent Radiation: no Aggravating factors: weight bearing, walking and movement  Alleviating factors: ice, APAP and rest , Celecoxib Status: fluctuating Treatments attempted: rest and APAP , Celecoxib Relief with NSAIDs?:  moderate Weakness with weight bearing or walking: yes Sensation of giving way: no Locking: no Popping: no Bruising: no Swelling: yes Redness: no Paresthesias/decreased sensation: no Fevers: no  Relevant past medical, surgical, family and social history reviewed and updated as indicated. Interim medical history since our last visit reviewed. Allergies and medications reviewed and updated.  Review of Systems  Constitutional: Negative for activity change, appetite  change, diaphoresis, fatigue and fever.  Respiratory: Negative for cough, chest tightness and shortness of breath.   Cardiovascular: Negative for chest pain, palpitations and leg swelling.  Gastrointestinal: Positive for abdominal pain, diarrhea and nausea. Negative for abdominal distention, constipation and vomiting.   Endocrine: Negative for cold intolerance and heat intolerance.  Musculoskeletal: Positive for arthralgias.  Neurological: Negative.   Psychiatric/Behavioral: Negative.     Per HPI unless specifically indicated above     Objective:    LMP 02/09/2017 (Within Weeks)   Wt Readings from Last 3 Encounters:  03/21/19 280 lb (127 kg)  01/19/19 284 lb 12 oz (129.2 kg)  09/05/18 280 lb (127 kg)    Physical Exam   Unable to perform due to telephone visit only.  Results for orders placed or performed in visit on 11/16/18  Microscopic Examination   URINE  Result Value Ref Range   WBC, UA >30 (A) 0 - 5 /hpf   RBC 3-10 (A) 0 - 2 /hpf   Epithelial Cells (non renal) 0-10 0 - 10 /hpf   Bacteria, UA Moderate (A) None seen/Few  Urine Culture, Reflex   URINE  Result Value Ref Range   Urine Culture, Routine Final report (A)    Organism ID, Bacteria Klebsiella pneumoniae (A)    Antimicrobial Susceptibility Comment   UA/M w/rflx Culture, Routine   Specimen: Urine   URINE  Result Value Ref Range   Specific Gravity, UA 1.020 1.005 - 1.030   pH, UA 5.5 5.0 - 7.5   Color, UA Yellow Yellow   Appearance Ur Cloudy (A) Clear   Leukocytes,UA 1+ (A) Negative   Protein,UA Negative Negative/Trace   Glucose, UA Negative Negative   Ketones, UA Negative Negative   RBC, UA 1+ (A) Negative   Bilirubin, UA Negative Negative   Urobilinogen, Ur 0.2 0.2 - 1.0 mg/dL   Nitrite, UA Negative Negative   Microscopic Examination See below:    Urinalysis Reflex Comment       Assessment & Plan:   Problem List Items Addressed This Visit      Digestive   Irritable bowel syndrome - Primary    Chronic, ongoing with current exacerbation.  H/O gastric bypass in 2016.  Will trial increase in Bentyl, since is offering some benefit.  Increase to 20 MG TID, script sent.  Recommend keeping food journal and avoiding trigger foods.  If ongoing will consider referral to GI and would benefit from face to face visit with  labs.  Plan to follow-up in 4 weeks.      Relevant Medications   dicyclomine (BENTYL) 20 MG tablet     Other   Right knee pain    Ongoing, patient reports a torn meniscus and has appointment with ortho on 08/16/2019.  Recommend she maintain this appointment.  Alternate ice/heat at home to knee + continue Tylenol (may take up to 3000 MG daily).  Recommend minimal use of Celebrex due to past history of CKD.  May try Lidocaine patches OTC, Icy/Hot and Voltaren gel as needed.  Encouraged her to rest knee until she sees ortho upcoming.  Will review ortho notes once available.        I discussed the assessment and treatment plan with the patient. The patient was provided an opportunity to ask questions and all were answered. The patient agreed with the plan and demonstrated an understanding of the instructions.   The patient was advised to call back or seek an in-person evaluation if the symptoms worsen  or if the condition fails to improve as anticipated.   I provided 15+ minutes of time during this encounter.   Follow up plan: Return in about 4 weeks (around 09/04/2019) for IBS.

## 2019-08-07 NOTE — Assessment & Plan Note (Addendum)
Chronic, ongoing with current exacerbation.  H/O gastric bypass in 2016.  Will trial increase in Bentyl, since is offering some benefit.  Increase to 20 MG TID, script sent.  Recommend keeping food journal and avoiding trigger foods.  If ongoing will consider referral to GI and would benefit from face to face visit with labs.  Plan to follow-up in 4 weeks.

## 2019-08-07 NOTE — Patient Instructions (Signed)
Diet for Irritable Bowel Syndrome  When you have irritable bowel syndrome (IBS), it is very important to eat the foods and follow the eating habits that are best for your condition. IBS may cause various symptoms such as pain in the abdomen, constipation, or diarrhea. Choosing the right foods can help to ease the discomfort from these symptoms. Work with your health care provider and diet and nutrition specialist (dietitian) to find the eating plan that will help to control your symptoms. What are tips for following this plan?      Keep a food diary. This will help you identify foods that cause symptoms. Write down: ? What you eat and when you eat it. ? What symptoms you have. ? When symptoms occur in relation to your meals, such as "pain in abdomen 2 hours after dinner."  Eat your meals slowly and in a relaxed setting.  Aim to eat 5-6 small meals per day. Do not skip meals.  Drink enough fluid to keep your urine pale yellow.  Ask your health care provider if you should take an over-the-counter probiotic to help restore healthy bacteria in your gut (digestive tract). ? Probiotics are foods that contain good bacteria and yeasts.  Your dietitian may have specific dietary recommendations for you based on your symptoms. He or she may recommend that you: ? Avoid foods that cause symptoms. Talk with your dietitian about other ways to get the same nutrients that are in those problem foods. ? Avoid foods with gluten. Gluten is a protein that is found in rye, wheat, and barley. ? Eat more foods that contain soluble fiber. Examples of foods with high soluble fiber include oats, seeds, and certain fruits and vegetables. Take a fiber supplement if directed by your dietitian. ? Reduce or avoid certain foods called FODMAPs. These are foods that contain carbohydrates that are hard to digest. Ask your doctor which foods contain these carbohydrates. What foods are not recommended? The following are some  foods and drinks that may make your symptoms worse:  Fatty foods, such as french fries.  Foods that contain gluten, such as pasta and cereal.  Dairy products, such as milk, cheese, and ice cream.  Chocolate.  Alcohol.  Products with caffeine, such as coffee.  Carbonated drinks, such as soda.  Foods that are high in FODMAPs. These include certain fruits and vegetables.  Products with sweeteners such as honey, high fructose corn syrup, sorbitol, and mannitol. The items listed above may not be a complete list of foods and beverages you should avoid. Contact a dietitian for more information. What foods are good sources of fiber? Your health care provider or dietitian may recommend that you eat more foods that contain fiber. Fiber can help to reduce constipation and other IBS symptoms. Add foods with fiber to your diet a little at a time so your body can get used to them. Too much fiber at one time might cause gas and swelling of your abdomen. The following are some foods that are good sources of fiber:  Berries, such as raspberries, strawberries, and blueberries.  Tomatoes.  Carrots.  Brown rice.  Oats.  Seeds, such as chia and pumpkin seeds. The items listed above may not be a complete list of recommended sources of fiber. Contact your dietitian for more options. Where to find more information  International Foundation for Functional Gastrointestinal Disorders: www.iffgd.org  National Institute of Diabetes and Digestive and Kidney Diseases: www.niddk.nih.gov Summary  When you have irritable bowel syndrome (IBS), it   is very important to eat the foods and follow the eating habits that are best for your condition.  IBS may cause various symptoms such as pain in the abdomen, constipation, or diarrhea.  Choosing the right foods can help to ease the discomfort that comes from symptoms.  Keep a food diary. This will help you identify foods that cause symptoms.  Your health  care provider or diet and nutrition specialist (dietitian) may recommend that you eat more foods that contain fiber. This information is not intended to replace advice given to you by your health care provider. Make sure you discuss any questions you have with your health care provider. Document Revised: 07/06/2018 Document Reviewed: 11/17/2016 Elsevier Patient Education  2020 Elsevier Inc.  

## 2019-08-07 NOTE — Assessment & Plan Note (Signed)
Ongoing, patient reports a torn meniscus and has appointment with ortho on 08/16/2019.  Recommend she maintain this appointment.  Alternate ice/heat at home to knee + continue Tylenol (may take up to 3000 MG daily).  Recommend minimal use of Celebrex due to past history of CKD.  May try Lidocaine patches OTC, Icy/Hot and Voltaren gel as needed.  Encouraged her to rest knee until she sees ortho upcoming.  Will review ortho notes once available.

## 2019-08-16 DIAGNOSIS — M222X1 Patellofemoral disorders, right knee: Secondary | ICD-10-CM | POA: Diagnosis not present

## 2019-08-16 DIAGNOSIS — M25561 Pain in right knee: Secondary | ICD-10-CM | POA: Diagnosis not present

## 2019-08-16 DIAGNOSIS — M1711 Unilateral primary osteoarthritis, right knee: Secondary | ICD-10-CM | POA: Diagnosis not present

## 2019-08-29 ENCOUNTER — Other Ambulatory Visit: Payer: Self-pay | Admitting: Orthopedic Surgery

## 2019-08-29 DIAGNOSIS — M25561 Pain in right knee: Secondary | ICD-10-CM

## 2019-08-31 ENCOUNTER — Ambulatory Visit
Admission: RE | Admit: 2019-08-31 | Discharge: 2019-08-31 | Disposition: A | Discharge status: Home / Self Care | Payer: BC Managed Care – PPO | Source: Ambulatory Visit | Attending: Orthopedic Surgery | Admitting: Orthopedic Surgery

## 2019-08-31 ENCOUNTER — Other Ambulatory Visit: Payer: Self-pay

## 2019-08-31 DIAGNOSIS — M25561 Pain in right knee: Secondary | ICD-10-CM | POA: Diagnosis not present

## 2019-09-07 ENCOUNTER — Encounter: Payer: Self-pay | Admitting: Nurse Practitioner

## 2019-09-07 ENCOUNTER — Telehealth (INDEPENDENT_AMBULATORY_CARE_PROVIDER_SITE_OTHER): Payer: BC Managed Care – PPO | Admitting: Nurse Practitioner

## 2019-09-07 DIAGNOSIS — K580 Irritable bowel syndrome with diarrhea: Secondary | ICD-10-CM

## 2019-09-07 DIAGNOSIS — G8929 Other chronic pain: Secondary | ICD-10-CM | POA: Diagnosis not present

## 2019-09-07 DIAGNOSIS — M25561 Pain in right knee: Secondary | ICD-10-CM | POA: Diagnosis not present

## 2019-09-07 NOTE — Patient Instructions (Signed)

## 2019-09-07 NOTE — Assessment & Plan Note (Signed)
Chronic, with improvement at this time.  H/O gastric bypass in 2016.  Will continue Bentyl 20 MG TID, which is offering improvement.  Recommend keeping food journal and avoiding trigger foods.  If ongoing will consider referral to GI and would benefit from face to face visit with labs.  Plan to follow-up in 6 months.

## 2019-09-07 NOTE — Progress Notes (Signed)
LMP 02/09/2017 (Within Weeks)    Subjective:    Patient ID: Cathy Mitchell Guardian, female    DOB: [birth year], 54 y.o.   MRN: [id number]  HPI: BERLYNN WARSAME is a 54 y.o. female  Chief Complaint  Patient presents with  . Irritable Bowel Syndrome    4 week f/up  . Results    patient wants to discuss MRI results    . This visit was completed via telephone due to the restrictions of the COVID-19 pandemic. All issues as above were discussed and addressed but no physical exam was performed. If it was felt that the patient should be evaluated in the office, they were directed there. The patient verbally consented to this visit. Patient was unable to complete an audio/visual visit due to Technical difficulties,Lack of internet. Due to the catastrophic nature of the COVID-19 pandemic, this visit was done through audio contact only. . Location of the patient: home . Location of the provider: work . Those involved with this call:  . Provider: Marnee Guarneri, DNP . CMA: Yvonna Alanis, CMA . Front Desk/Registration: Don Perking  . Time spent on call: 20 minutes on the phone discussing health concerns. 15 minutes total spent in review of patient's record and preparation of their chart.  . I verified patient identity using two factors (patient name and date of birth). Patient consents verbally to being seen via telemedicine visit today.    GAS PAIN Has history of gastric bypass March 18, 2015.  Increased Bentyl to 20 MG TID last visit, as lower dose was offering some benefit, but not 100%.   Is unable to eat salads due to irritable bowel symptoms, which frustrates her as she likes salads.  Reports the increased dose of Bentyl is working well and she can leave house now. Duration:weeks Onset: sudden Severity: mild Quality: cramping Location:  diffuse  Radiation: no Frequency: intermittent Alleviating factors: Bentyl Aggravating factors: certain foods -- salad Status:  fluctuating Treatments attempted: none Fever: no Nausea: yes Vomiting: no Weight loss: no Decreased appetite: no Diarrhea: yes Constipation: no Blood in stool: no Heartburn: no Jaundice: no Recurrent NSAID use: no   KNEE PAIN Has torn meniscus right knee and saw ortho 08/16/19.  She is dealing with pain and is trying home Celebrex as needed, not often.  Takes Tylenol as needed.  Can not take narcotics, stated only one she can take is Vicodin.  Had MRI on 08/31/19 noting degenerative changes with no acute bony findings or discrete tears.  She states she can not "go on cortisone as it messes" her up.  Reports she was told she would need replacement at some point.  Duration: weeks Involved knee: right  Mechanism of injury: unknown Location:anterior Onset: gradual Severity: moderate  Quality:  dull and aching Frequency: intermittent Radiation: no Aggravating factors: weight bearing, walking and movement  Alleviating factors: ice, APAP and rest , Celecoxib Status: fluctuating Treatments attempted: rest and APAP , Celecoxib Relief with NSAIDs?:  moderate Weakness with weight bearing or walking: yes Sensation of giving way: no Locking: no Popping: no Bruising: no Swelling: yes Redness: no Paresthesias/decreased sensation: no Fevers: no  Relevant past medical, surgical, family and social history reviewed and updated as indicated. Interim medical history since our last visit reviewed. Allergies and medications reviewed and updated.  Review of Systems  Constitutional: Negative for activity change, appetite change, diaphoresis, fatigue and fever.  Respiratory: Negative for cough, chest tightness and shortness of breath.   Cardiovascular: Negative for chest  pain, palpitations and leg swelling.  Gastrointestinal: Negative for abdominal distention, abdominal pain, constipation, diarrhea, nausea and vomiting.  Endocrine: Negative for cold intolerance and heat intolerance.   Musculoskeletal: Positive for arthralgias.  Neurological: Negative.   Psychiatric/Behavioral: Negative.     Per HPI unless specifically indicated above     Objective:    LMP 02/09/2017 (Within Weeks)   Wt Readings from Last 3 Encounters:  03/21/19 280 lb (127 kg)  01/19/19 284 lb 12 oz (129.2 kg)  09/05/18 280 lb (127 kg)    Physical Exam   Unable to perform due to telephone visit only.  Results for orders placed or performed in visit on 11/16/18  Microscopic Examination   URINE  Result Value Ref Range   WBC, UA >30 (A) 0 - 5 /hpf   RBC 3-10 (A) 0 - 2 /hpf   Epithelial Cells (non renal) 0-10 0 - 10 /hpf   Bacteria, UA Moderate (A) None seen/Few  Urine Culture, Reflex   URINE  Result Value Ref Range   Urine Culture, Routine Final report (A)    Organism ID, Bacteria Klebsiella pneumoniae (A)    Antimicrobial Susceptibility Comment   UA/M w/rflx Culture, Routine   Specimen: Urine   URINE  Result Value Ref Range   Specific Gravity, UA 1.020 1.005 - 1.030   pH, UA 5.5 5.0 - 7.5   Color, UA Yellow Yellow   Appearance Ur Cloudy (A) Clear   Leukocytes,UA 1+ (A) Negative   Protein,UA Negative Negative/Trace   Glucose, UA Negative Negative   Ketones, UA Negative Negative   RBC, UA 1+ (A) Negative   Bilirubin, UA Negative Negative   Urobilinogen, Ur 0.2 0.2 - 1.0 mg/dL   Nitrite, UA Negative Negative   Microscopic Examination See below:    Urinalysis Reflex Comment       Assessment & Plan:   Problem List Items Addressed This Visit      Digestive   Irritable bowel syndrome - Primary    Chronic, with improvement at this time.  H/O gastric bypass in 2016.  Will continue Bentyl 20 MG TID, which is offering improvement.  Recommend keeping food journal and avoiding trigger foods.  If ongoing will consider referral to GI and would benefit from face to face visit with labs.  Plan to follow-up in 6 months.        Other   Right knee pain    Ongoing and being followed by  orthopedics.  Alternate ice/heat at home to knee + continue Tylenol (may take up to 3000 MG daily).  Recommend minimal use of Celebrex due to past history of CKD.  May try Lidocaine patches OTC, Icy/Hot and Voltaren gel as needed.  Encouraged her to rest knee until she sees ortho for follow-up.  Will review ortho notes once available.         I discussed the assessment and treatment plan with the patient. The patient was provided an opportunity to ask questions and all were answered. The patient agreed with the plan and demonstrated an understanding of the instructions.   The patient was advised to call back or seek an in-person evaluation if the symptoms worsen or if the condition fails to improve as anticipated.   I provided 21+ minutes of time during this encounter.  Follow up plan: Return in about 6 months (around 03/08/2020) for Chronic disease.

## 2019-09-07 NOTE — Assessment & Plan Note (Signed)
Ongoing and being followed by orthopedics.  Alternate ice/heat at home to knee + continue Tylenol (may take up to 3000 MG daily).  Recommend minimal use of Celebrex due to past history of CKD.  May try Lidocaine patches OTC, Icy/Hot and Voltaren gel as needed.  Encouraged her to rest knee until she sees ortho for follow-up.  Will review ortho notes once available.

## 2019-09-15 DIAGNOSIS — M170 Bilateral primary osteoarthritis of knee: Secondary | ICD-10-CM | POA: Diagnosis not present

## 2019-09-15 DIAGNOSIS — M25561 Pain in right knee: Secondary | ICD-10-CM | POA: Diagnosis not present

## 2019-09-15 DIAGNOSIS — M25562 Pain in left knee: Secondary | ICD-10-CM | POA: Diagnosis not present

## 2019-09-15 DIAGNOSIS — G8929 Other chronic pain: Secondary | ICD-10-CM | POA: Diagnosis not present

## 2019-10-03 ENCOUNTER — Telehealth: Payer: Self-pay

## 2019-10-03 DIAGNOSIS — H9209 Otalgia, unspecified ear: Secondary | ICD-10-CM | POA: Diagnosis not present

## 2019-10-03 NOTE — Telephone Encounter (Signed)
Contacted pt to follow up where she had called in over the weekend. Not received stated pt complained of chest heaviness and cough as well as dizziness and vomiting, pt assumed ear infection. Pt stated to me she never made any complaints other than an ear infection and all she needed was amoxicillin and since the nurse told her she had Covid or a stroke, she waited until things got much worse and went elsewhere to get amoxicillin. I attempted to apologize to the pt for her experience and she disconnected the call.

## 2019-10-09 ENCOUNTER — Other Ambulatory Visit: Payer: Self-pay | Admitting: Nurse Practitioner

## 2019-10-09 DIAGNOSIS — H938X3 Other specified disorders of ear, bilateral: Secondary | ICD-10-CM

## 2019-10-09 NOTE — Telephone Encounter (Signed)
Copied from [place] [phone]. Topic: Referral - Request for Referral >> Oct 09, 2019  8:07 AM Celene Kras wrote: Has patient seen PCP for this complaint? Yes.   *If NO, is insurance requiring patient see PCP for this issue before PCP can refer them? Referral for which specialty: ENT Preferred provider/office: [facility] Reason for referral: Pt has had fluid in her ears for 3wks. Please advise.

## 2019-10-10 DIAGNOSIS — H8111 Benign paroxysmal vertigo, right ear: Secondary | ICD-10-CM | POA: Diagnosis not present

## 2019-10-10 DIAGNOSIS — R42 Dizziness and giddiness: Secondary | ICD-10-CM | POA: Diagnosis not present

## 2019-10-16 ENCOUNTER — Other Ambulatory Visit: Payer: Self-pay | Admitting: Nurse Practitioner

## 2019-10-16 NOTE — Telephone Encounter (Signed)
Requested medication (s) are due for refill today: yes  Requested medication (s) are on the active medication list: yes  Last refill:  08/22/2019  Future visit scheduled: no  Notes to clinic:  this refill cannot be delegated    Requested Prescriptions  Pending Prescriptions Disp Refills   Cholecalciferol (VITAMIN D3) 1.25 MG (50000 UT) CAPS [Pharmacy Med Name: VITAMIN D3 50,000 UNIT CAPSULE] 8 capsule 1    Sig: TAKE 1 TABLET BY MOUTH ONCE A WEEK. FOR 8 WEEKS AND THEN STOP. RETURN TO OFFICE FOR LAB DRAW.      Endocrinology:  Vitamins - Vitamin D Supplementation Failed - 10/16/2019  1:27 AM      Failed - 50,000 IU strengths are not delegated      Failed - Ca in normal range and within 360 days    Calcium  Date Value Ref Range Status  03/08/2018 8.9 8.9 - 10.3 mg/dL Final   Calcium, Total  Date Value Ref Range Status  06/19/2012 8.5 8.5 - 10.1 mg/dL Final          Failed - Phosphate in normal range and within 360 days    No results found for: PHOS        Failed - Vitamin D in normal range and within 360 days    25-Hydroxy, Vitamin D-3  Date Value Ref Range Status  08/04/2016 30 ng/mL Final    Comment:    (NOTE) Performed At: [facility] [place], [place] [id number] [place] Sheral Apley MD [id number]    25-Hydroxy, Vitamin D-2  Date Value Ref Range Status  08/04/2016 2.1 ng/mL Final   25-Hydroxy, Vitamin D  Date Value Ref Range Status  08/04/2016 32 ng/mL Final    Comment:    (NOTE) Reference Range: All Ages: Target levels 30 - 100           Passed - Valid encounter within last 12 months    Recent Outpatient Visits           1 month ago Irritable bowel syndrome with diarrhea   [facility] [place], [place] T, NP   2 months ago Irritable bowel syndrome with diarrhea   [facility] [place], [place] T, NP   5 months ago Shortness of breath   [place], NP   6 months  ago Osteoarthritis of knee (Bilateral) (R>L)   [place], [place], [place]   11 months ago Acute cystitis with hematuria   [facility] Venita Lick, NP

## 2019-10-17 DIAGNOSIS — H8111 Benign paroxysmal vertigo, right ear: Secondary | ICD-10-CM | POA: Diagnosis not present

## 2019-10-17 DIAGNOSIS — R42 Dizziness and giddiness: Secondary | ICD-10-CM | POA: Diagnosis not present

## 2019-10-25 ENCOUNTER — Encounter: Payer: Self-pay | Admitting: Unknown Physician Specialty

## 2019-10-25 ENCOUNTER — Ambulatory Visit (INDEPENDENT_AMBULATORY_CARE_PROVIDER_SITE_OTHER): Payer: BC Managed Care – PPO | Admitting: Unknown Physician Specialty

## 2019-10-25 ENCOUNTER — Other Ambulatory Visit: Payer: Self-pay

## 2019-10-25 VITALS — BP 140/87 | HR 82 | Temp 97.9°F | Wt 294.2 lb

## 2019-10-25 DIAGNOSIS — R635 Abnormal weight gain: Secondary | ICD-10-CM | POA: Diagnosis not present

## 2019-10-25 DIAGNOSIS — M170 Bilateral primary osteoarthritis of knee: Secondary | ICD-10-CM | POA: Diagnosis not present

## 2019-10-25 DIAGNOSIS — H8110 Benign paroxysmal vertigo, unspecified ear: Secondary | ICD-10-CM

## 2019-10-25 MED ORDER — MECLIZINE HCL 25 MG PO TABS: 25.0000 mg | ORAL_TABLET | Freq: Three times a day (TID) | ORAL | 0 refills | Status: DC | PRN

## 2019-10-25 NOTE — Assessment & Plan Note (Signed)
Severe OA and pt  Would like to consider a knee replacement.  Discussed f/u with the physician who gave her a knee injection to discuss further management (as suggested in his last note).  Will start PT for strengthening and vertigo management.  Discussed using assistive devices.  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  Alternative exercise plans given.

## 2019-10-25 NOTE — Progress Notes (Signed)
BP (!) 140/87   Pulse 82   Temp 97.9 F (36.6 C) (Oral)   Wt (!) 294 lb 3.2 oz (133.4 kg)   LMP 02/09/2017 (Within Weeks)   SpO2 98%   BMI 45.40 kg/m    Subjective:    Patient ID: LESSLY STIGLER, female    DOB: Dec 24, 1965, 54 y.o.   MRN: [id number]  HPI: CHANCEY CULLINANE is a 54 y.o. female  Chief Complaint  Patient presents with  . Dizziness    pt states she has been having spells of vertigo and falling for the last few months     Vertigo/Falls risk:   Shimeka is today concerned about her increased falling.  This is due primarily to severe OA of bilateral knees, right worse than left.  I reviewed her chart.  One month ago she had symvisc injection of her knee.  This is not helping and she would like to consider a knee replacement.  She is worried about her weight, which has been climbing because she is unable to exercise.  She had been doing intense exercise to help manage her weight in the past.  She founds that a hinged knee brace helps get her back into the gym. However, she also has right rotator cuff issues and plantar fasciitis.  Xray and MRI show moderate to severe OA without a discrete meniscal tear  She is also having a "touch of" vertigo.  This is a recurring problem and has worked with ENT for Epley exercises with much improvement. She still has problems at times and asking for Meclizine.     Relevant past medical, surgical, family and social history reviewed and updated as indicated. Interim medical history since our last visit reviewed. Allergies and medications reviewed and updated.  Review of Systems  Per HPI unless specifically indicated above     Objective:    BP (!) 140/87   Pulse 82   Temp 97.9 F (36.6 C) (Oral)   Wt (!) 294 lb 3.2 oz (133.4 kg)   LMP 02/09/2017 (Within Weeks)   SpO2 98%   BMI 45.40 kg/m   Wt Readings from Last 3 Encounters:  10/25/19 (!) 294 lb 3.2 oz (133.4 kg)  03/21/19 280 lb (127 kg)  01/19/19 284 lb 12 oz (129.2 kg)      Physical Exam Constitutional:      General: She is not in acute distress.    Appearance: Normal appearance. She is well-developed.  HENT:     Head: Normocephalic and atraumatic.     Right Ear: Tympanic membrane and ear canal normal.     Left Ear: Tympanic membrane and ear canal normal.     Mouth/Throat:     Mouth: Mucous membranes are moist.     Pharynx: Oropharynx is clear.  Eyes:     General: Lids are normal. No scleral icterus.       Right eye: No discharge.        Left eye: No discharge.     Conjunctiva/sclera: Conjunctivae normal.  Neck:     Vascular: No carotid bruit or JVD.  Cardiovascular:     Rate and Rhythm: Normal rate and regular rhythm.     Heart sounds: Normal heart sounds.  Pulmonary:     Effort: Pulmonary effort is normal.     Breath sounds: Normal breath sounds.  Abdominal:     Palpations: There is no hepatomegaly or splenomegaly.  Musculoskeletal:        General: Normal range  of motion.     Cervical back: Normal range of motion and neck supple.  Skin:    General: Skin is warm and dry.     Coloration: Skin is not pale.     Findings: No rash.  Neurological:     Mental Status: She is alert and oriented to person, place, and time.  Psychiatric:        Behavior: Behavior normal.        Thought Content: Thought content normal.        Judgment: Judgment normal.      Assessment & Plan:   Problem List Items Addressed This Visit      Unprioritized   Osteoarthritis of knee (Bilateral) (R>L) - Primary (Chronic)    Severe OA and pt  Would like to consider a knee replacement.  Discussed f/u with the physician who gave her a knee injection to discuss further management (as suggested in his last note).  Will start PT for strengthening and vertigo management.  Discussed using assistive devices.  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  Alternative exercise plans given.        Relevant Orders   Ambulatory referral to Physical Therapy    Vertigo, benign positional    Intermittent problem.  Discussed PT for continued management.  Will refill Meclizine but caution drowsiness and may not help her more intermittent and mild episodes.  Will work with PT for self epley maneuvers.        Relevant Orders   Ambulatory referral to Physical Therapy   Weight gain    Frustrated with weight gain but discussed exercise the way she has done is not possible.  Alternative exercises given and consult with PT for guidance.  Encouraged to go back on her vegan diet which has helped in the past.             Follow up plan: Return if symptoms worsen or fail to improve and with Orthopedics.Marland Kitchen

## 2019-10-25 NOTE — Assessment & Plan Note (Signed)
Frustrated with weight gain but discussed exercise the way she has done is not possible.  Alternative exercises given and consult with PT for guidance.  Encouraged to go back on her vegan diet which has helped in the past.

## 2019-10-25 NOTE — Patient Instructions (Addendum)
1. Call Rosalia Hammers for f/u of injection results to discuss next steps.    2. Start with PT for strenthening and alternative exercises.  Discuss with them your vertigo as well for maintenance.    3.  Use your walker everywhere as you are too unsteady to go without it.    Suggested Exercises:  Pool walking Exercise bike - start with 2 minutes - spin and work up the time.   Upper body exercises for no more than 10 minutes, almost no weight or use bands Pahla Bean - Exercises for people over 50 - start with beginner  4. Continue with vegan diet. Ignore salt precautions  5. Get a hair analysis test and we will discuss the results  PT:    [address] [place], [place] [address] [phone] Fax: [phone]

## 2019-10-25 NOTE — Assessment & Plan Note (Signed)
Intermittent problem.  Discussed PT for continued management.  Will refill Meclizine but caution drowsiness and may not help her more intermittent and mild episodes.  Will work with PT for self epley maneuvers.

## 2019-10-26 DIAGNOSIS — M25361 Other instability, right knee: Secondary | ICD-10-CM | POA: Diagnosis not present

## 2019-10-29 ENCOUNTER — Other Ambulatory Visit: Payer: Self-pay | Admitting: Nurse Practitioner

## 2019-11-01 DIAGNOSIS — M25561 Pain in right knee: Secondary | ICD-10-CM | POA: Diagnosis not present

## 2019-11-01 DIAGNOSIS — M25562 Pain in left knee: Secondary | ICD-10-CM | POA: Diagnosis not present

## 2019-11-03 DIAGNOSIS — M25561 Pain in right knee: Secondary | ICD-10-CM | POA: Diagnosis not present

## 2019-11-03 DIAGNOSIS — M25562 Pain in left knee: Secondary | ICD-10-CM | POA: Diagnosis not present

## 2019-11-28 DIAGNOSIS — M1711 Unilateral primary osteoarthritis, right knee: Secondary | ICD-10-CM | POA: Diagnosis not present

## 2019-12-05 DIAGNOSIS — M1711 Unilateral primary osteoarthritis, right knee: Secondary | ICD-10-CM | POA: Diagnosis not present

## 2019-12-12 DIAGNOSIS — M1711 Unilateral primary osteoarthritis, right knee: Secondary | ICD-10-CM | POA: Diagnosis not present

## 2019-12-26 DIAGNOSIS — Z0189 Encounter for other specified special examinations: Secondary | ICD-10-CM | POA: Diagnosis not present

## 2019-12-26 DIAGNOSIS — Z23 Encounter for immunization: Secondary | ICD-10-CM | POA: Diagnosis not present

## 2019-12-26 DIAGNOSIS — M1712 Unilateral primary osteoarthritis, left knee: Secondary | ICD-10-CM | POA: Diagnosis not present

## 2020-01-01 ENCOUNTER — Other Ambulatory Visit: Payer: Self-pay | Admitting: Nurse Practitioner

## 2020-01-01 MED ORDER — VITAMIN D3 1.25 MG (50000 UT) PO CAPS: ORAL_CAPSULE | ORAL | 1 refills | Status: DC

## 2020-01-02 DIAGNOSIS — M1712 Unilateral primary osteoarthritis, left knee: Secondary | ICD-10-CM | POA: Diagnosis not present

## 2020-01-06 IMAGING — CT CT ABD-PELV W/ CM
2 of 5 series · 17 of 46 positions shown, 19 images · IV contrast (APPLIED)
Comparison: 06/19/2012.  Abdominal ultrasound 11/08/2014

CLINICAL DATA: Right lower quadrant abdominal pain.

EXAM:
CT ABDOMEN AND PELVIS WITH CONTRAST
TECHNIQUE: Multidetector CT imaging of the abdomen and pelvis was performed
using the standard protocol following bolus administration of
intravenous contrast.
CONTRAST:  150mL OMNIPAQUE IOHEXOL 300 MG/ML  SOLN

[Series 2: routine abd/pel with · axial · 0.93mm/px · z∈[-1008,-573]mm · 14 of 99 slices shown, 16 images]
[im 6/99  soft-tissue]
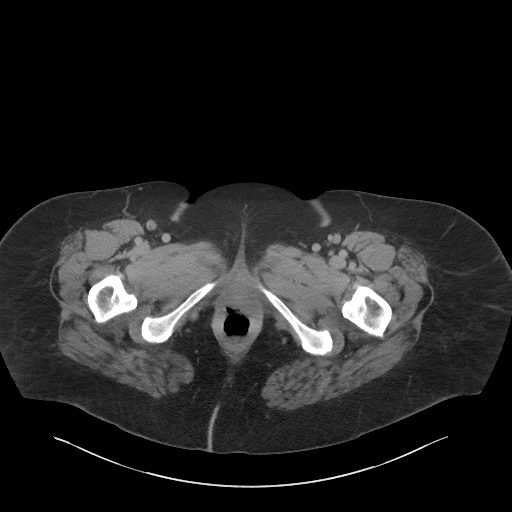
[im 6/99  bone]
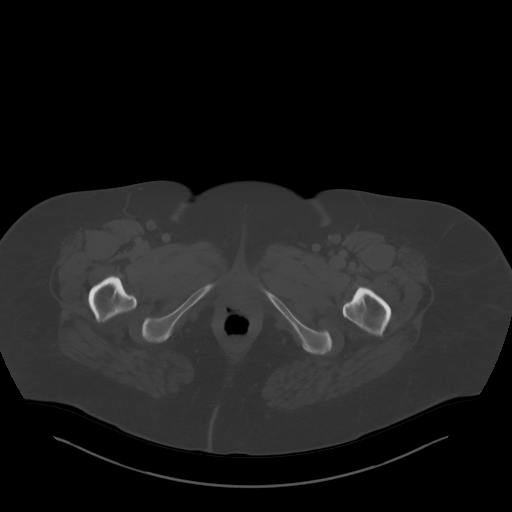
[im 11/99  soft-tissue]
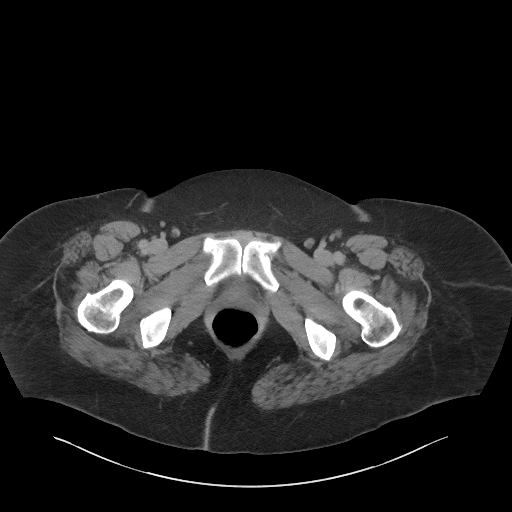
[im 22/99  soft-tissue]
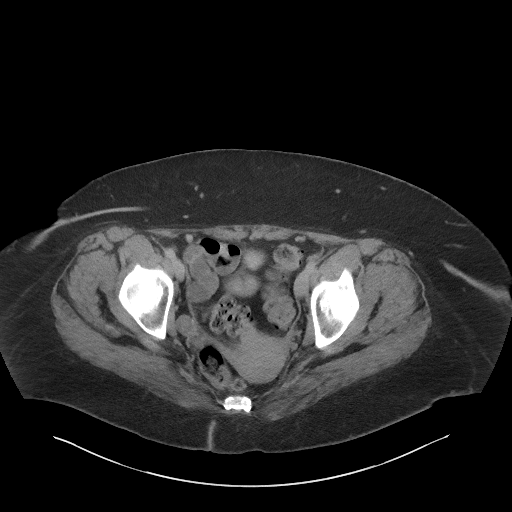
[im 28/99  soft-tissue]
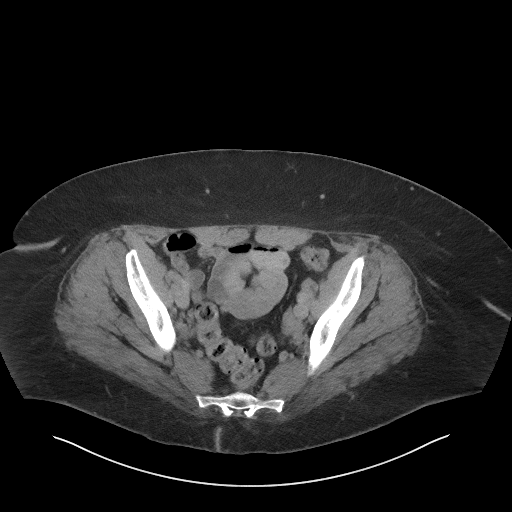
[im 33/99  soft-tissue]
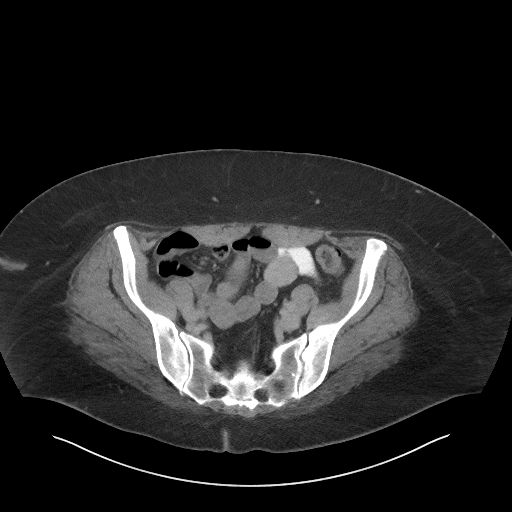
[im 39/99  soft-tissue]
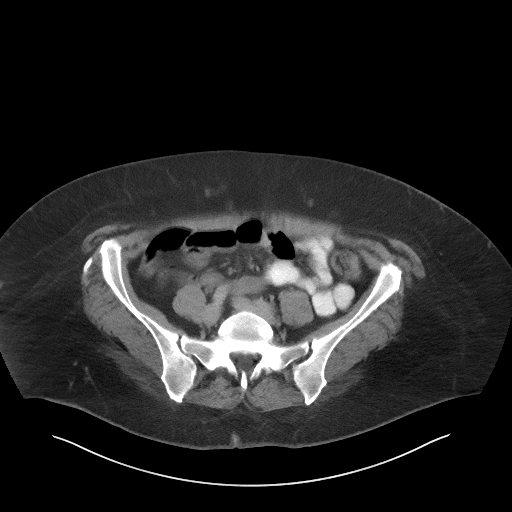
[im 44/99  soft-tissue]
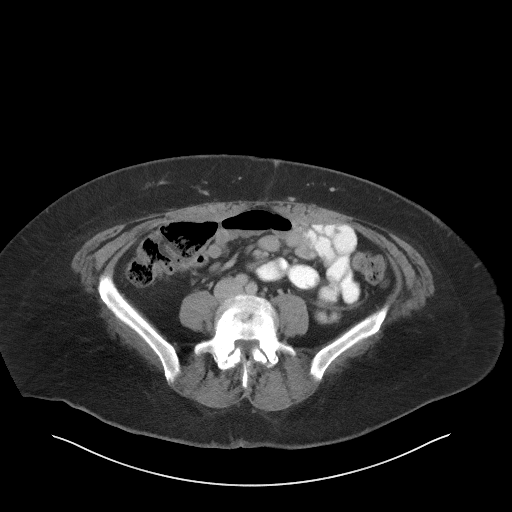
[im 55/99  soft-tissue]
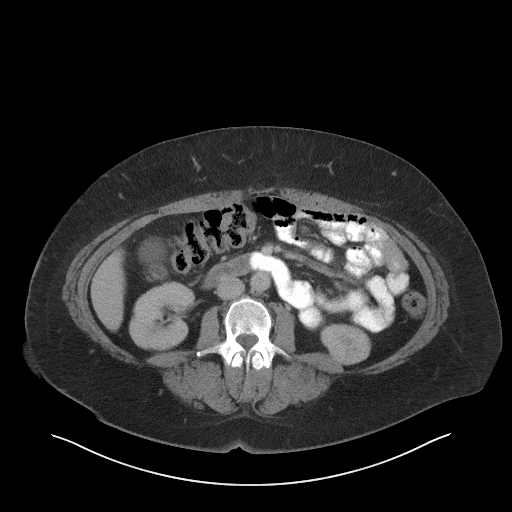
[im 60/99  soft-tissue]
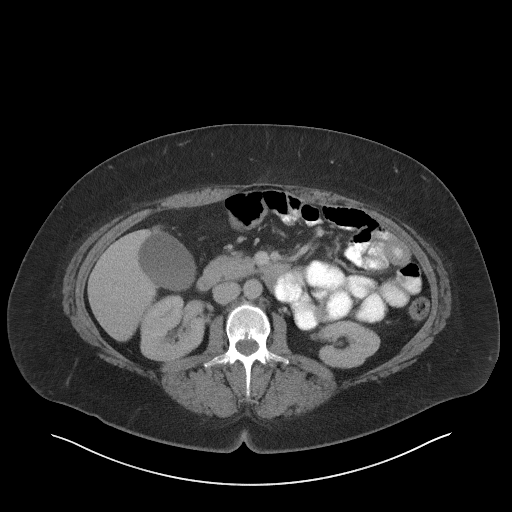
[im 60/99  bone]
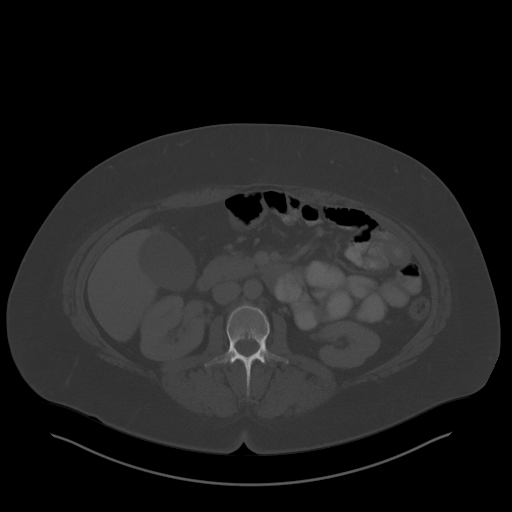
[im 66/99  soft-tissue]
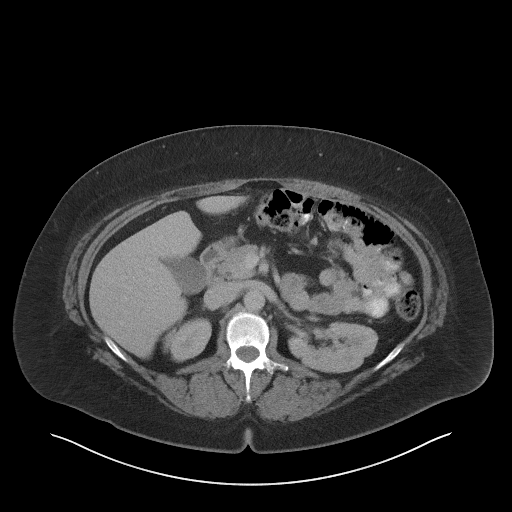
[im 71/99  soft-tissue]
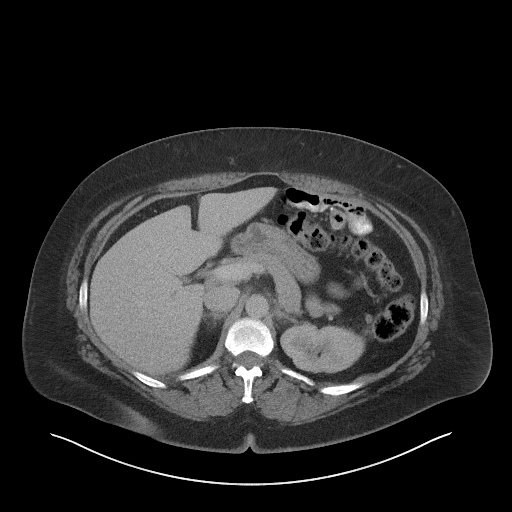
[im 77/99  soft-tissue]
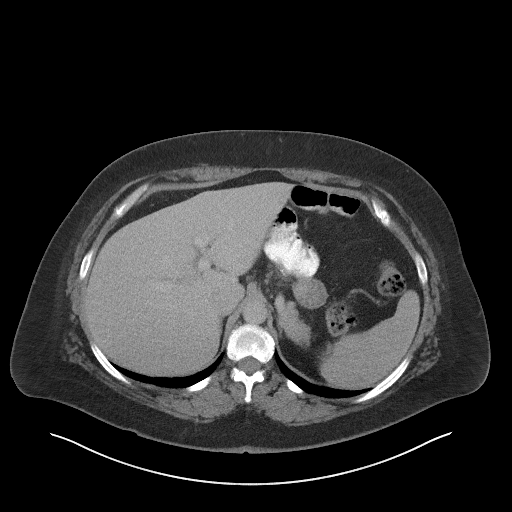
[im 88/99  soft-tissue]
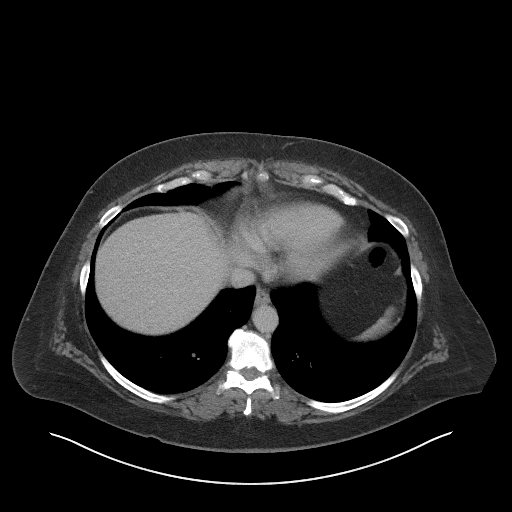
[im 93/99  soft-tissue]
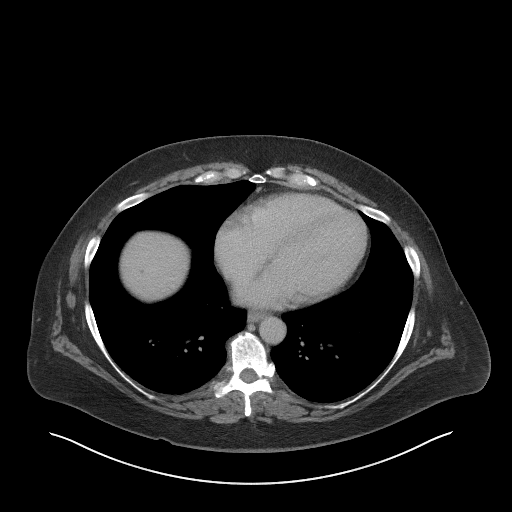

[Series 5: coronal st · coronal · 0.96mm/px · 3 of 97 slices shown]
[im 33/97  soft-tissue]
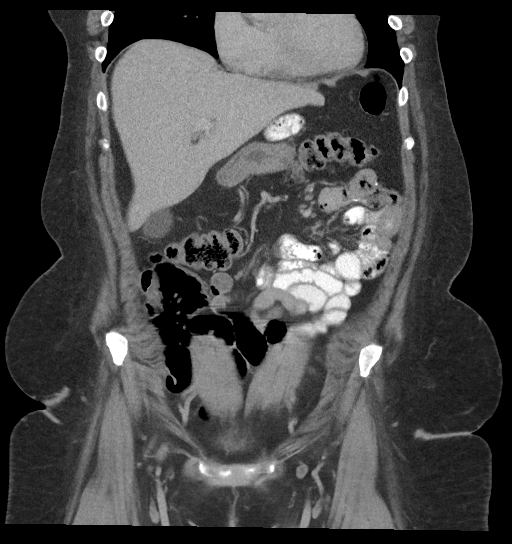
[im 43/97  soft-tissue]
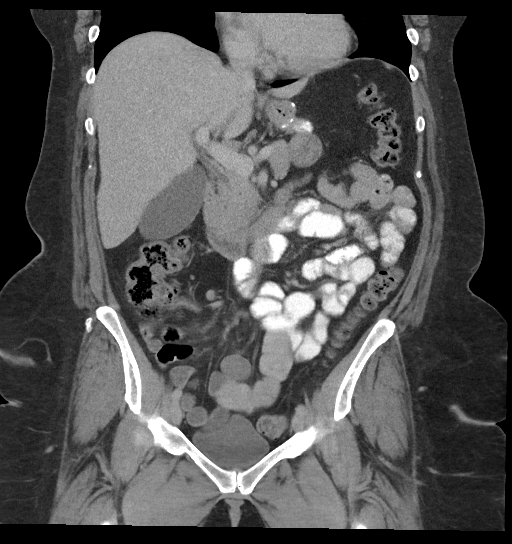
[im 54/97  soft-tissue]
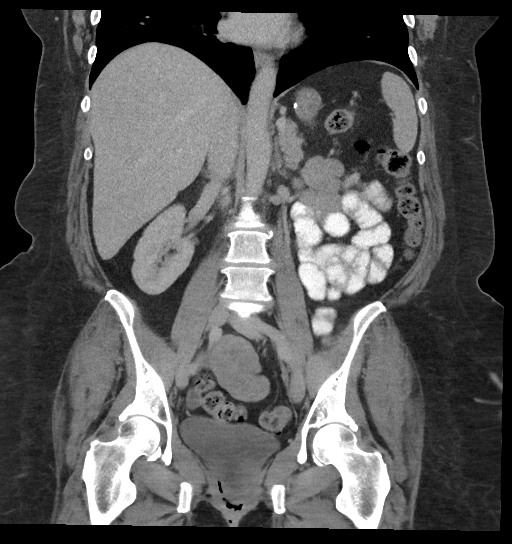

[17 of 46 positions shown; findings below may reference images not displayed]

FINDINGS: Lower chest: Lung bases are clear. No effusions. Heart is normal
size.

Hepatobiliary: No focal hepatic abnormality. Gallbladder
unremarkable.

Pancreas: No focal abnormality or ductal dilatation.

Spleen: No focal abnormality.  Normal size.

Adrenals/Urinary Tract: No adrenal abnormality. No focal renal
abnormality. No stones or hydronephrosis. Urinary bladder is
unremarkable.

Stomach/Bowel: Appendix is not definitively seen. No pericecal
inflammation. No evidence of bowel obstruction. Prior gastric
bypass.

Vascular/Lymphatic: No evidence of aneurysm or adenopathy.

Reproductive: Uterus and adnexa unremarkable.  No mass.

Other: No free fluid or free air.

Musculoskeletal: No acute bony abnormality.
IMPRESSION: Appendix not definitively seen.  No visible pericecal inflammation.

Prior gastric bypass.

No acute findings.

## 2020-01-09 ENCOUNTER — Other Ambulatory Visit: Payer: Self-pay | Admitting: Cardiovascular Disease

## 2020-01-09 DIAGNOSIS — M1712 Unilateral primary osteoarthritis, left knee: Secondary | ICD-10-CM | POA: Diagnosis not present

## 2020-01-09 NOTE — Telephone Encounter (Signed)
Patient scheduled 10/14

## 2020-01-09 NOTE — Telephone Encounter (Signed)
Please schedule 12 month F/U appointment with Dr. Fletcher Anon. Thank you!

## 2020-01-10 NOTE — Progress Notes (Signed)
Office Visit    Patient Name: Cathy Mitchell Date of Encounter: 01/11/2020  Primary Care Provider:  Venita Lick, NP Primary Cardiologist:  Cathy Sacramento, MD  Chief Complaint    Chief Complaint  Patient presents with   Other    12 month f/u no complaints today. Meds reviewed verbally with pt.   54 year old female with history of frequent symptomatic PVCs, gastric bypass surgery, sleep apnea not on CPAP, diastolic dysfunction, mild MR, prior history of tobacco use, and here today for 88-month follow-up.  Past Medical History    Past Medical History:  Diagnosis Date   Anxiety    Arthritis    Fibromyalgia    Hypertension    no longer has hypertension   Neuromuscular disorder ([place])    L-sided tremors   Sleep apnea    does not use C-PAP   Past Surgical History:  Procedure Laterality Date   CARPAL TUNNEL RELEASE Bilateral 2007   DILATION AND CURETTAGE OF UTERUS     GANGLION CYST EXCISION Bilateral    GANGLION CYST EXCISION Right 10/08/2015   Procedure: REMOVAL GANGLION OF WRIST;  Surgeon: Cathy Knows, MD;  Location: [facility] OR[facility];  Service: Orthopedics;  Laterality: Right;   GASTRIC BYPASS     HAND SURGERY Bilateral Resection   HYSTEROSCOPY WITH D & C N/A 05/24/2017   Procedure: DILATATION AND CURETTAGE /HYSTEROSCOPY;  Surgeon: Mitchell, Cathy Slim, MD;  Location: [facility] OR[facility];  Service: Gynecology;  Laterality: N/A;   INCONTINENCE SURGERY  2009   LEEP N/A 05/24/2017   Procedure: LOOP ELECTROSURGICAL EXCISION PROCEDURE (LEEP);  Surgeon: Cathy Mars, MD;  Location: [facility];  Service: Gynecology;  Laterality: N/A;   TUBAL LIGATION      Allergies  Allergies  Allergen Reactions   Oxycodone-Acetaminophen Itching and Other (See Comments)    Reaction:  Dizziness and blurred vision    Other Other (See Comments)    Patient states she is allergic to all narcotics   Percocet [Oxycodone-Acetaminophen] Nausea And Vomiting and Other (See Comments)      Reaction:  Dizziness and blurred vision  Reaction:  Dizziness and blurred vision     History of Present Illness    Cathy Mitchell is a 54 y.o. female with PMH as above.  She has no history of diabetes, hyperlipidemia, current tobacco use (though does have past history of tobacco use), or excessive caffeine use.  She quit smoking in the early 2020s.  She does not have a family history of premature coronary artery disease.  She does have family history of heart disease in her maternaln grandfather and paternal grandfather.  She does have a history of sleep apnea but refuses CPAP.  She also has a history of symptomatic PVCs.    She underwent treadmill stress test 11/2014 before gastric bypass surgery that was without significant ischemia and ruled low risk.  2018 Holter monitoring showed very frequent PVCs, totaling 24,000 beats in 48 hours and representing 12% burden.  Subsequent echo showed normal LVSF with mild MR.  PVCs reportedly responded better to diltiazem and metoprolol.  Repeat monitoring showed 5,000 PVCs in 24 hours, representing 5% burden.  She was last seen by her primary cardiologist, Dr. Fletcher Mitchell, 01/19/2019.  At that time, she was doing reasonably well with no recent chest pain or shortness of breath.  She continued to report palpitations, though not as frequent as before and mostly at night.  She had gained 26 pounds over the last 6 months and  was attempting to lose this weight again.  Today, 01/11/2020, she presents to clinic and notes ongoing frustration with sedentary lifestyle and weight gain.  She reports that she recently discovered that she needs bilateral knee surgery.  She has been getting gel injections in her knees for the ongoing knee pain with right leg worse than that of the left but needs to discontinue the injections.  Since discontinuing the gel injections, she has started to feel the clicking and popping come back, which is very distressing to her.  She is currently  using a walker to assist with ambulation.  She had to give up gardening due to her knee pain, despite elevated plantar beds.  Due to her knee pain, she is very sedentary, and has not been maintaining the active lifestyle she is accustomed to, because she used to be "gym rat."  She reports that she is still attempting to do cardio, though very limited as she cannot use her lower body.  [facility]/water aerobics exercises.  She denies any chest pain or shortness of breath with ambulation.  No chest pain or shortness of breath at rest.  She reports fluttering in her chest that occurs when she lays down at night, lasting only 1 second duration.  No presyncope or syncope.  No recent falls.  She denies any significant lower extremity edema, abdominal distention, orthopnea, PND, or early satiety.  Long discussion regarding her CPAP and that she does not tolerate it at night.  She states that she is never gotten very much sleep at night, often sleeping for 2 hours per night.  She states this runs in her family.  She reports her PCP monitors her thyroid very closely.  She recently discovered that the flavored water that she drinks during the day, called "workout explosion," has some caffeine and without it she noted a slight headache once in the past.  The caffeinated water has not increased her palpitations to her knowledge.  She reports that she is trying to adjust her diet to go vegan.  She recently lost her nutritionist and agreeable for referral for an additional nutritionist to help guide her lifestyle changes, including diet.  Previous smoker and no current tobacco use. She denies any alcohol intake, reporting that she feels allergic to alcohol given her response to smelling it.  She denies any signs or symptoms of bleeding.  She reports medication compliance.  Home Medications    Current Outpatient Medications on File Prior to Visit  Medication Sig Dispense  Refill   acetaminophen (TYLENOL) 500 MG tablet Take 1,500 mg by mouth daily as needed for mild pain.     Ascorbic Acid (VITAMIN C) 1000 MG tablet Take 1,000 mg by mouth daily.     CALCIUM-MAGNESIUM-ZINC PO Take 1 Dose by mouth daily.     celecoxib (CELEBREX) 100 MG capsule TAKE 1 CAPSULE BY MOUTH EVERY DAY AS NEEDED FOR PAIN 30 capsule 9   Cholecalciferol (VITAMIN D3) 1.25 MG (50000 UT) CAPS TAKE 1 TABLET BY MOUTH ONCE A WEEK. FOR 8 WEEKS AND THEN STOP. RETURN TO OFFICE FOR LAB DRAW. 8 capsule 1   cyanocobalamin 1000 MCG tablet Take 1,000 mcg by mouth daily.     dicyclomine (BENTYL) 20 MG tablet TAKE 1 TABLET BY MOUTH THREE TIMES A DAY 270 tablet 1   diltiazem (CARDIZEM CD) 180 MG 24 hr capsule TAKE 1 CAPSULE BY MOUTH EVERY DAY 90 capsule 0  fluticasone (FLONASE) 50 MCG/ACT nasal spray Place 2 sprays into both nostrils daily as needed for allergies. 16 g 6   glucosamine-chondroitin 500-400 MG tablet Take 1 tablet by mouth daily.     meclizine (ANTIVERT) 25 MG tablet Take 1 tablet (25 mg total) by mouth 3 (three) times daily as needed for dizziness. 30 tablet 0   Multiple Vitamins-Minerals (MULTIPLE VITAMINS/WOMENS) tablet Take 2 tablets by mouth daily. women's 50+     Olopatadine HCl (PATADAY) 0.2 % SOLN Apply 1 drop to eye daily. Apply one drop to each eye daily as needed for dry and itchy eyes. 2.5 mL 5   Potassium 99 MG TABS Take 99 mg by mouth daily.     No current facility-administered medications on file prior to visit.    Review of Systems    She reports palpitations at night, lasting only 1 second, and when she lays down to go to sleep.  She reports sleeping only 2 hours per night, which is ongoing and runs in the family.  She reports an instance of slight headache when she did not drink her caffeinated water but that this caffeinated water does not worsen her palpitations.  She has bilateral knee pain.  She denies chest pain, shortness of breath, dyspnea, pnd, orthopnea,  n, v, dizziness, syncope, edema,  or early satiety.  She continues to work on weight loss.  Bilateral tremor noted on exam, reportedly chronic per patient and mostly on the left side.  All other systems reviewed and are otherwise negative except as noted above.  Physical Exam    VS:  BP 120/78 (BP Location: Left Arm, Patient Position: Sitting, Cuff Size: Large)    Pulse 70    Ht 5\' 9"  (1.753 m)    Wt 294 lb 2 oz (133.4 kg)    LMP 02/09/2017 (Within Weeks)    SpO2 98%    BMI 43.43 kg/m  , BMI Body mass index is 43.43 kg/m. GEN: Well nourished, well developed, in no acute distress. HEENT: normal. Neck: Supple.  JVD difficult to assess due to body habitus.  No carotid bruits or masses. Cardiac: RRR, 1/6 systolic murmurs, rubs, or gallops. No clubbing, cyanosis, edema.  Radials/DP/PT 2+ and equal bilaterally.  Respiratory:  Respirations regular and unlabored, clear to auscultation bilaterally. GI: Soft, nontender, nondistended, BS + x 4. MS: no deformity or atrophy.  Bilateral tremor appreciated, worse on the left side, which she reports is chronic and not new for her. Skin: warm and dry, no rash. Neuro:  Strength and sensation are intact.  Tremor noted, worse on the left side Psych: Normal affect.  Accessory Clinical Findings    ECG personally reviewed by me today -NSR, 70 bpm, QRS 96 ms, QTC 477 ms, resolution of previously seen PVCs, poor R wave progression in the inferior leads as seen in prior tracings, poor R wave progression in anterior leads (consider lead placement)- no acute changes.  VITALS Reviewed today   Temp Readings from Last 3 Encounters:  10/25/19 97.9 F (36.6 C) (Oral)  01/19/19 (!) 97 F (36.1 C)  09/21/18 98 F (36.7 C) (Oral)   BP Readings from Last 3 Encounters:  01/11/20 120/78  10/25/19 (!) 140/87  01/19/19 120/74   Pulse Readings from Last 3 Encounters:  01/11/20 70  10/25/19 82  01/19/19 77    Wt Readings from Last 3 Encounters:  01/11/20 294 lb 2 oz  (133.4 kg)  10/25/19 (!) 294 lb 3.2 oz (133.4 kg)  03/21/19  280 lb (127 kg)     LABS  reviewed today   Lab Results  Component Value Date   WBC 4.7 03/09/2018   HGB 13.0 03/09/2018   HCT 40 03/09/2018   MCV 88.6 03/08/2018   PLT 365 03/08/2018   Lab Results  Component Value Date   CREATININE 0.53 03/08/2018   BUN 11 03/09/2018   NA 142 03/09/2018   K 4.5 03/09/2018   CL 105 03/08/2018   CO2 28 03/08/2018   Lab Results  Component Value Date   ALT 20 03/09/2018   AST 13 03/09/2018   ALKPHOS 91 03/09/2018   BILITOT 0.5 03/08/2018   Lab Results  Component Value Date   CHOL 166 03/09/2018   HDL 56 03/09/2018   LDLCALC 90 03/09/2018   TRIG 101 03/09/2018   CHOLHDL 4.3 12/03/2014    Lab Results  Component Value Date   HGBA1C 5.7 10/15/2014   Lab Results  Component Value Date   TSH 0.674 03/22/2017     STUDIES/PROCEDURES reviewed today   Cardiac monitoring 01/12/2018 Normal sinus rhythm.  Average heart rate was 75 bpm. Frequent PVCs with a total of 5000 beats in 24 hours representing a 5% burden. Occasional PACs.  Echo 03/12/2017 - Left ventricle: The cavity size was normal. Systolic function was  normal. The estimated ejection fraction was in the range of 60%  to 65%. Wall motion was normal; there were no regional wall  motion abnormalities. Doppler parameters are consistent with  abnormal left ventricular relaxation (grade 1 diastolic  dysfunction).  - Mitral valve: There was mild regurgitation.  - Left atrium: The atrium was mildly dilated.  - Right ventricle: Systolic function was normal.  - Pulmonary arteries: Systolic pressure was within the normal  range.  Impressions:  - Frequent PVCs noted.   Assessment & Plan    Symptomatic PVCs, improved --Reports ongoing improvement in her symptoms with diltiazem extended release 180 mg daily.  No PVCs visualized on EKG today.  She describes palpitations at night when lying down to sleep,  lasting only at 1 second.  She does not appreciate any palpitations during the day.  Most recent monitor as above. Previous echo as above from 2018 shows EF 60 to 65% and G1DD.  She denies any SOB/DOE. Euvolemic and well compensated on exam.  Not on on a a standing diuretic and without an indication to start one today.  Continue current diltiazem extended release 180 mg daily.  No medication changes.      Mitral regurgitation, mild --No signs or symptoms of worsening valvular disease.  Previous 2018 echo as above showed mild MR.  Continue to monitor.  No indication to update echo today. Reassess at RTC.  Sleep apnea, not on CPAP --Long discussion regarding sleep apnea and CPAP use.  She is concerned about cleaning the CPAP mask, as well as overall intolerance of the full facemask.  We reviewed recent advances in the CPAP mask and cleaning supplies.  Also discussed were alternatives to the CPAP mask, which she could look into between visits.  CPAP use recommended, if tolerated, given cardiovascular benefits and for risk factor modification. Reviewed benefits of treating OSA, eluding improved quality of sleep and assistance with weight loss.  BMI 43.43 kg/m s/p weight loss surgery 2016 --She continues to try to lose weight; however, given her recent bilateral knee pain, this has been challenging for her, as she is unable to complete any weightbearing/high impact cardiovascular exercises.  She is looking into  low impact water aerobics.  She also is working towards knee surgery.  Recommend ongoing dietary and lifestyle changes, as tolerated by her knee pain.  As previously noted, she did have weight loss surgery in 2016.  She is eager to get back on track and requests/is agreeable to a nutritionist referral today.  Medication changes: None Labs ordered: None Studies / Imaging ordered: None.  Dietitian referral provided. Disposition: RTC 6 months or sooner if needed   Arvil Chaco,  PA-C 01/11/2020

## 2020-01-11 ENCOUNTER — Ambulatory Visit: Payer: BC Managed Care – PPO | Admitting: Physician Assistant

## 2020-01-11 ENCOUNTER — Other Ambulatory Visit: Payer: Self-pay

## 2020-01-11 ENCOUNTER — Encounter: Payer: Self-pay | Admitting: Physician Assistant

## 2020-01-11 VITALS — BP 120/78 | HR 70 | Ht 69.0 in | Wt 294.1 lb

## 2020-01-11 DIAGNOSIS — M170 Bilateral primary osteoarthritis of knee: Secondary | ICD-10-CM

## 2020-01-11 DIAGNOSIS — I340 Nonrheumatic mitral (valve) insufficiency: Secondary | ICD-10-CM

## 2020-01-11 DIAGNOSIS — I493 Ventricular premature depolarization: Secondary | ICD-10-CM | POA: Diagnosis not present

## 2020-01-11 DIAGNOSIS — G4733 Obstructive sleep apnea (adult) (pediatric): Secondary | ICD-10-CM | POA: Diagnosis not present

## 2020-01-11 DIAGNOSIS — Z6841 Body Mass Index (BMI) 40.0 and over, adult: Secondary | ICD-10-CM | POA: Diagnosis not present

## 2020-01-11 NOTE — Patient Instructions (Addendum)
Medication Instructions:   1. Your physician recommends that you continue on your current medications as directed. Please refer to the Current Medication list given to you today.  *If you need a refill on your cardiac medications before your next appointment, please call your pharmacy*   Lab Work:  1. None Ordered  If you have labs (blood work) drawn today and your tests are completely normal, you will receive your results only by: Marland Kitchen MyChart Message (if you have MyChart) OR . A paper copy in the mail If you have any lab test that is abnormal or we need to change your treatment, we will call you to review the results.   Testing/Procedures:  1. None Ordered   Follow-Up: At [facility], you and your health needs are our priority.  As part of our continuing mission to provide you with exceptional heart care, we have created designated Provider Care Teams.  These Care Teams include your primary Cardiologist (physician) and Advanced Practice Providers (APPs -  Physician Assistants and Nurse Practitioners) who all work together to provide you with the care you need, when you need it.  We recommend signing up for the patient portal called "MyChart".  Sign up information is provided on this After Visit Summary.  MyChart is used to connect with patients for Virtual Visits (Telemedicine).  Patients are able to view lab/test results, encounter notes, upcoming appointments, etc.  Non-urgent messages can be sent to your provider as well.   To learn more about what you can do with MyChart, go to [email or web address].    Your next appointment:   6 month(s)  The format for your next appointment:   In Person  Provider:   You may see Kathlyn Sacramento, MD or one of the following Advanced Practice Providers on your designated Care Team:     Marrianne Mood, PA-C     Other Instructions  We are referring you to a Dietitian, if you do not hear anything within 2 weeks you can follow up with  then. The number is available on the next page.

## 2020-01-22 ENCOUNTER — Other Ambulatory Visit: Payer: Self-pay

## 2020-01-22 ENCOUNTER — Encounter: Payer: BC Managed Care – PPO | Attending: Physician Assistant | Admitting: Dietician

## 2020-01-22 ENCOUNTER — Encounter: Payer: Self-pay | Admitting: Dietician

## 2020-01-22 VITALS — Ht 69.0 in | Wt 299.7 lb

## 2020-01-22 DIAGNOSIS — Z6841 Body Mass Index (BMI) 40.0 and over, adult: Secondary | ICD-10-CM | POA: Diagnosis not present

## 2020-01-22 NOTE — Progress Notes (Signed)
Medical Nutrition Therapy: Visit start time: 0900  end time: 1030  Assessment:  Diagnosis: obesity Past medical history: sleep apnea, arthritis, fibromyaglia  Psychosocial issues/ stress concerns: pt rates stress level as "high" and feels "poorly" about stress management skills   Preferred learning method:  . Auditory . Visual . Hands-on  Current weight: 299.7lbs  Height: 5'9" Medications, supplements: will reconcile at follow up   Progress and evaluation:   Pt states she had bariatric surgery in 2017, used to weigh around 387 lbs   Lost 100 lbs and has gained back 30 lbs   Pt reports sleep quality is poor (averages 2-4 hours/night); has sleep apnea and does not currently use her CPAP  Pt states she feels like she has no ambition and feels depressed  States she is having to 'force herself to do 99% of her tasks'   Pt  states she has seen therapist and states her therapist said she is "fine but too self aware"   Pt reports her son has many food sensitivities that she now has to cook for  wheat, dairy, banana, tomato, onion, walnut, pine nut, egg    Pt avoids bread, fish, and [place]; stating these foods make her feel poorly   Pt states she has been trying keto for over a year, and now interested in trying a vegan style of eating  Limiting carb intake to 20g Carb/day and at least 90g Protein/day  Lately she has not been following keto-style diet, reverted to increased snacking and increased intake of sweets    Pt goals: 40-lb weight loss by March, build muscle around knee for surgery in March 2022  Pt states she needs double knee replacement   Pt states she can not exercise how she used to due to knee pain  Pt states gym used to be a stress reliever, but now exercise is a stressor due to stress of losing weight in time for insurance approval of surgery    Pt states she is confused about nutrition due to conflicting advice she has received (e.g. eat carbs v. don't eat  carbs)  Physical activity: 60-75 min recumbent-style bike, 3-4 days/week   Dietary Intake:  Usual eating pattern includes 2-3 meals and 1-2 snacks per day. Dining out frequency: 1-2 meals per week.  Breakfast: eggs with bacon; low sugar yogurt with walnuts and berries   Lunch: not reported  Supper: not reported  Snack: low sugar yogurt with walnuts and berries; cookies and sweets    Beverages: low calorie sports drinks, unsweet tea   Nutrition Care Education: Basic nutrition: basic food groups, appropriate nutrient balance    Advanced nutrition: nutrition label reading  Other lifestyle changes:  Importance of mental health   Nutritional Diagnosis:  NB-1.1 Food and nutrition-related knowledge deficit As related to weight management .  As evidenced by pt diet recall and discussion. [place]-3.3 Overweight/obesity As related to excess caloric intake and inadequate physical activity.  As evidenced by pt current BMI of 44.26.  Intervention:  Discussion and instruction cover basic nutrition today. Pt described feeling a lack of motivation, overwhelmed with cooking and planning meals, and fatigued from trying to follow all the conflicting nutrition and health advice she has received.  In addition to all of that, her son has just been diagnosed with several food sensitivities the pt has to learn how to cook for.  Reviewed some recipes and websites that cater to cooking for individuals with food sensitivities.  Discussed options for meals and snacks  that require minimal prep work, as long as pt understands what to look for on the label of packaged foods.  Pt appeared burned out and exhausted from not reaching her goals in the timeline she anticipated.  I believe pt may benefit from additional mental health counseling.  Provided pt with contact information for pastoral care.  Established goals for additional change.     Education Materials given:  . 10 healthy diet changes  . Goals/  instructions  Learner/ who was taught:  . Patient   Level of understanding: . Partial understanding; needs review/ practice  Demonstrated degree of understanding via:   Teach back Learning barriers: . None  Willingness to learn/ readiness for change: . Hesitance, contemplating change  Monitoring and Evaluation:  Dietary intake, exercise, and body weight      follow up: November 15 at [facility]

## 2020-01-22 NOTE — Patient Instructions (Signed)
   Try some of the new recipes from the websites discussed   [email or web address]  [email or web address]   Call pastoral care to set up an initial therapy visit   When snacking pair your carb with a protein

## 2020-02-12 ENCOUNTER — Other Ambulatory Visit: Payer: Self-pay

## 2020-02-12 ENCOUNTER — Encounter: Payer: BC Managed Care – PPO | Attending: Physician Assistant | Admitting: Dietician

## 2020-02-12 VITALS — Ht 69.0 in | Wt 292.7 lb

## 2020-02-12 DIAGNOSIS — Z6841 Body Mass Index (BMI) 40.0 and over, adult: Secondary | ICD-10-CM | POA: Diagnosis not present

## 2020-02-12 NOTE — Progress Notes (Signed)
Medical Nutrition Therapy: Visit start time: 0900  end time: 1000  Assessment:  Diagnosis: obesity  Medical history changes: none Psychosocial issues/ stress concerns: has follow up counseling visit 11/18  Current weight: 292.7 lbs  Height: 5'9" Medications, supplement changes: none  Progress and evaluation:   Pt weight down 7 lbs but pt states she is surprised to see her weight is down   Pt states she is averaging 2-3 hours of sleep a night; often wakes up around midnight   Pt reports her husband and son both have several food allergies that are challenging to cook for; especially because it is not clear what foods cause her son to have a reaction   Pt reports she would like to meal prep things that she can freeze  Pt states she often has mental fog that she attributes to her fibromyalgia   Pt notes poor dietary habits and high stress level make fog worse   Pt reports stress level remains high about if she will be approved for surgery   Physical activity: ADLs  Dietary Intake:  Usual eating pattern includes 2-3 meals and 1-2 snacks per day. Dining out frequency: 1-2 meals per week.  Breakfast: light greek yogurt with blueberries and 1/4 cup walnuts Snack: wheat thins (3-4 servings) with fruit and cheese Lunch: low carb tortilla wrap; chicken salad with triscuits   Supper: [employer] frozen lasagna meals; soup  Snack: banana with PB Beverages: water  Nutrition Care Education: Advanced nutrition: food label reading Diabetes:   appropriate meal and snack schedule, appropriate carb intake and balance, role of fiber, protein Other:  healthy and unhealthy fats, probiotics Other lifestyle changes:  benefits of making changes, importance of mental health  Nutritional Diagnosis:  NB-1.1 Food and nutrition-related knowledge deficit As related to weight management.  As evidenced by pt diet recall and discussion. [place]-3.3 Overweight/obesity As related to excess caloric intake and  inadequate physical activity.  As evidenced by pt current BMI of 43.22.  Intervention:  Discussion and instruction as noted above.  Discussed how exercise may look different with varying levels of mobility.  Answered questions about what to look for in a probiotic supplement.  Pt has had phone visit with pastoral care and follow up call is scheduled for this Thursday.  Encouraged pt to continue to meet with therapist.  Emphasized the benefits of a coordinated approach to help pt meet her health goals.  Established goals for positive change.   Education Materials given:  . [facility] [facility] meal prep ideas . Plate planner with food lists  . Goals/ instructions  Learner/ who was taught:  . Patient   Level of understanding: Marland Kitchen Verbalizes/ demonstrates competency  Demonstrated degree of understanding via:   Teach back Learning barriers: . None  Willingness to learn/ readiness for change: . Acceptance, ready for change  Monitoring and Evaluation:  Dietary intake, exercise, and body weight      follow up: December 13 at [facility]

## 2020-02-12 NOTE — Patient Instructions (Addendum)
   Continue to meet with therapist   Continue to moderate carb intake   2-3 servings at meals   1-2 servings at snacks   Pair a protein with carbs at snacks

## 2020-02-20 NOTE — Telephone Encounter (Signed)
Appointment to discuss alternate medication options would be good.  Looks like she is already seeing a Nutritionist, if she would like referral to Medical Weight Management we can do this as well (they prescribe the medication she is asking for).

## 2020-02-27 ENCOUNTER — Other Ambulatory Visit: Payer: Self-pay

## 2020-02-27 ENCOUNTER — Encounter: Payer: Self-pay | Admitting: Nurse Practitioner

## 2020-02-27 ENCOUNTER — Ambulatory Visit (INDEPENDENT_AMBULATORY_CARE_PROVIDER_SITE_OTHER): Payer: BC Managed Care – PPO | Admitting: Nurse Practitioner

## 2020-02-27 VITALS — BP 141/90 | HR 91 | Temp 97.8°F | Ht 66.77 in | Wt 298.1 lb

## 2020-02-27 DIAGNOSIS — Z6841 Body Mass Index (BMI) 40.0 and over, adult: Secondary | ICD-10-CM | POA: Diagnosis not present

## 2020-02-27 DIAGNOSIS — Z713 Dietary counseling and surveillance: Secondary | ICD-10-CM | POA: Diagnosis not present

## 2020-02-27 MED ORDER — SAXENDA 18 MG/3ML ~~LOC~~ SOPN: PEN_INJECTOR | SUBCUTANEOUS | 0 refills | Status: DC

## 2020-02-27 NOTE — Patient Instructions (Signed)
Liraglutide injection (Weight Management) What is this medicine? LIRAGLUTIDE (LIR a GLOO tide) is used to help people lose weight and maintain weight loss. It is used with a reduced-calorie diet and exercise. This medicine may be used for other purposes; ask your health care provider or pharmacist if you have questions. COMMON BRAND NAME(S): Saxenda What should I tell my health care provider before I take this medicine? They need to know if you have any of these conditions:  endocrine tumors (MEN 2) or if someone in your family had these tumors  gallbladder disease  high cholesterol  history of alcohol abuse problem  history of pancreatitis  kidney disease or if you are on dialysis  liver disease  previous swelling of the tongue, face, or lips with difficulty breathing, difficulty swallowing, hoarseness, or tightening of the throat  stomach problems  suicidal thoughts, plans, or attempt; a previous suicide attempt by you or a family member  thyroid cancer or if someone in your family had thyroid cancer  an unusual or allergic reaction to liraglutide, other medicines, foods, dyes, or preservatives  pregnant or trying to get pregnant  breast-feeding How should I use this medicine? This medicine is for injection under the skin of your upper leg, stomach area, or upper arm. You will be taught how to prepare and give this medicine. Use exactly as directed. Take your medicine at regular intervals. Do not take it more often than directed. This drug comes with INSTRUCTIONS FOR USE. Ask your pharmacist for directions on how to use this drug. Read the information carefully. Talk to your pharmacist or health care provider if you have questions. It is important that you put your used needles and syringes in a special sharps container. Do not put them in a trash can. If you do not have a sharps container, call your pharmacist or healthcare provider to get one. A special MedGuide will be  given to you by the pharmacist with each prescription and refill. Be sure to read this information carefully each time. Talk to your pediatrician regarding the use of this medicine in children. Special care may be needed. Overdosage: If you think you have taken too much of this medicine contact a poison control center or emergency room at once. NOTE: This medicine is only for you. Do not share this medicine with others. What if I miss a dose? If you miss a dose, take it as soon as you can. If it is almost time for your next dose, take only that dose. Do not take double or extra doses. If you miss your dose for 3 days or more, call your doctor or health care professional to talk about how to restart this medicine. What may interact with this medicine?  insulin and other medicines for diabetes This list may not describe all possible interactions. Give your health care provider a list of all the medicines, herbs, non-prescription drugs, or dietary supplements you use. Also tell them if you smoke, drink alcohol, or use illegal drugs. Some items may interact with your medicine. What should I watch for while using this medicine? Visit your doctor or health care professional for regular checks on your progress. Drink plenty of fluids while taking this medicine. Check with your doctor or health care professional if you get an attack of severe diarrhea, nausea, and vomiting. The loss of too much body fluid can make it dangerous for you to take this medicine. This medicine may affect blood sugar levels. Ask your healthcare  provider if changes in diet or medicines are needed if you have diabetes. Patients and their families should watch out for worsening depression or thoughts of suicide. Also watch out for sudden changes in feelings such as feeling anxious, agitated, panicky, irritable, hostile, aggressive, impulsive, severely restless, overly excited and hyperactive, or not being able to sleep. If this happens,  especially at the beginning of treatment or after a change in dose, call your health care professional. Women should inform their health care provider if they wish to become pregnant or think they might be pregnant. Losing weight while pregnant is not advised and may cause harm to the unborn child. Talk to your health care provider for more information. What side effects may I notice from receiving this medicine? Side effects that you should report to your doctor or health care professional as soon as possible:  allergic reactions like skin rash, itching or hives, swelling of the face, lips, or tongue  breathing problems  diarrhea that continues or is severe  lump or swelling on the neck  severe nausea  signs and symptoms of infection like fever or chills; cough; sore throat; pain or trouble passing urine  signs and symptoms of low blood sugar such as feeling anxious; confusion; dizziness; increased hunger; unusually weak or tired; increased sweating; shakiness; cold, clammy skin; irritable; headache; blurred vision; fast heartbeat; loss of consciousness  signs and symptoms of kidney injury like trouble passing urine or change in the amount of urine  trouble swallowing  unusual stomach upset or pain  vomiting Side effects that usually do not require medical attention (report to your doctor or health care professional if they continue or are bothersome):  constipation  decreased appetite  diarrhea  fatigue  headache  nausea  pain, redness, or irritation at site where injected  stomach upset  stuffy or runny nose This list may not describe all possible side effects. Call your doctor for medical advice about side effects. You may report side effects to FDA at 1-800-FDA-1088. Where should I keep my medicine? Keep out of the reach of children. Store unopened pen in a refrigerator between 2 and 8 degrees C (36 and 46 degrees F). Do not freeze or use if the medicine has been  frozen. Protect from light and excessive heat. After you first use the pen, it can be stored at room temperature between 15 and 30 degrees C (59 and 86 degrees F) or in a refrigerator. Throw away your used pen after 30 days or after the expiration date, whichever comes first. Do not store your pen with the needle attached. If the needle is left on, medicine may leak from the pen. NOTE: This sheet is a summary. It may not cover all possible information. If you have questions about this medicine, talk to your doctor, pharmacist, or health care provider.  2020 Elsevier/Gold Standard (2019-01-19 21:16:59)

## 2020-02-27 NOTE — Progress Notes (Addendum)
BP (!) 141/90   Pulse 91   Temp 97.8 F (36.6 C)   Ht 5' 6.77" (1.696 m)   Wt 298 lb 2 oz (135.2 kg)   LMP 02/09/2017 (Within Weeks)   SpO2 98%   BMI 47.01 kg/m    Subjective:    Patient ID: Cathy Mitchell, female    DOB: [birth year], 54 y.o.   MRN: [id number]  HPI: Cathy Mitchell is a 54 y.o. female presenting to discuss options for weight loss.  Chief Complaint  Patient presents with  . Obesity    Patient needs bilateral knee replacement and needs to loose weight before she has it done. Patient would like something to help decrease her appetite   DESIRE FOR APPETITE SUPPRESSANT Patient is here because she "needs a medication for appetite suppression."  States she had gastric bypass in 2016.  In 2017, was given a medication that worked wonders for stopping her "bad habit," and used this medication for about 3 months.  She has been working hard on her diet and increasing exercise, however recently her knee pain has stopped her from being able to go to the gym and exercise.  She would like to lose weight to have greater success when she recovers from her bilateral knee replacements scheduled for March.   Duration: chronic Previous attempts at weight loss: yes Complications of obesity: knee pain, fibromyalgia Peak weight:  299 Weight loss goal: ~10-20 lbs Requesting obesity pharmacotherapy: yes Current weight loss supplements/medications: no Previous weight loss supplements/meds: yes  Allergies  Allergen Reactions  . Oxycodone-Acetaminophen Itching and Other (See Comments)    Reaction:  Dizziness and blurred vision   . Other Other (See Comments)    Patient states she is allergic to all narcotics  . Percocet [Oxycodone-Acetaminophen] Nausea And Vomiting and Other (See Comments)    Reaction:  Dizziness and blurred vision  Reaction:  Dizziness and blurred vision    Outpatient Encounter Medications as of 02/27/2020  Medication Sig  . acetaminophen (TYLENOL) 500 MG tablet Take  1,500 mg by mouth daily as needed for mild pain.  . Ascorbic Acid (VITAMIN C) 1000 MG tablet Take 1,000 mg by mouth daily.  Marland Kitchen CALCIUM-MAGNESIUM-ZINC PO Take 1 Dose by mouth daily.  . celecoxib (CELEBREX) 100 MG capsule TAKE 1 CAPSULE BY MOUTH EVERY DAY AS NEEDED FOR PAIN  . Cholecalciferol (VITAMIN D3) 1.25 MG (50000 UT) CAPS TAKE 1 TABLET BY MOUTH ONCE A WEEK. FOR 8 WEEKS AND THEN STOP. RETURN TO OFFICE FOR LAB DRAW.  . cyanocobalamin 1000 MCG tablet Take 1,000 mcg by mouth daily.  Marland Kitchen dicyclomine (BENTYL) 20 MG tablet TAKE 1 TABLET BY MOUTH THREE TIMES A DAY  . fluticasone (FLONASE) 50 MCG/ACT nasal spray Place 2 sprays into both nostrils daily as needed for allergies.  Marland Kitchen glucosamine-chondroitin 500-400 MG tablet Take 1 tablet by mouth daily.  . Liraglutide -Weight Management (SAXENDA) 18 MG/3ML SOPN Inject 0.6 mg into the skin daily for 7 days, THEN 1.2 mg daily for 7 days, THEN 1.8 mg daily for 7 days, THEN 2.4 mg daily for 7 days, THEN 3 mg daily.  . meclizine (ANTIVERT) 25 MG tablet Take 1 tablet (25 mg total) by mouth 3 (three) times daily as needed for dizziness.  . Multiple Vitamins-Minerals (MULTIPLE VITAMINS/WOMENS) tablet Take 2 tablets by mouth daily. women's 50+  . Olopatadine HCl (PATADAY) 0.2 % SOLN Apply 1 drop to eye daily. Apply one drop to each eye daily as needed for dry  and itchy eyes.  . Potassium 99 MG TABS Take 99 mg by mouth daily.  . [DISCONTINUED] diltiazem (CARDIZEM CD) 180 MG 24 hr capsule TAKE 1 CAPSULE BY MOUTH EVERY DAY   No facility-administered encounter medications on file as of 02/27/2020.   Patient Active Problem List   Diagnosis Date Noted  . BMI 40.0-44.9, adult ([place]) 02/27/2020  . Weight gain 10/25/2019  . Vertigo, benign positional 10/25/2019  . S/P D&C (status post dilation and curettage) 05/24/2017  . S/P LEEP 05/24/2017  . Postmenopausal vaginal bleeding 04/30/2017  . Obesity (BMI 35.0-39.9 without comorbidity) 03/22/2017  . Occipital headaches  (Bilateral) (L>R) 08/27/2016  . Cervicogenic headache (Bilateral) (L>R) 08/27/2016  . Tricompartment osteoarthritis of knee (Right) 08/04/2016  . Osteoarthritis 08/04/2016  . Osteoarthritis of knee (Bilateral) (R>L) 08/04/2016  . Chronic neck pain (Location of Secondary source of pain) (Bilateral) 08/04/2016  . Osteoarthritis of hip (Right) 08/04/2016  . Eczema of right hand 07/31/2016  . Wheat allergy 02/12/2016  . Chronic hip pain (Location of Tertiary source of pain) (Right) 02/12/2016  . Rosacea 04/29/2015  . Obstructive sleep apnea 09/10/2014  . Chronic pain syndrome 09/10/2014  . Irritable bowel syndrome 09/10/2014  . Nicotine dependence, cigarettes, uncomplicated 97/41/6384  . Acute anxiety 09/10/2014  . Fibromyalgia 09/10/2014   Past Medical History:  Diagnosis Date  . Anxiety   . Arthritis   . Fibromyalgia   . Hypertension    no longer has hypertension  . Neuromuscular disorder ([place])    L-sided tremors  . Sleep apnea    does not use C-PAP   Relevant past medical, surgical, family and social history reviewed and updated as indicated. Interim medical history since our last visit reviewed.  Review of Systems  Constitutional: Negative.   Respiratory: Negative.   Cardiovascular: Negative.   Musculoskeletal: Positive for arthralgias, gait problem and joint swelling. Negative for back pain, myalgias, neck pain and neck stiffness.  Skin: Negative.   Allergic/Immunologic: Negative.   Neurological: Negative for dizziness, light-headedness and headaches.  Psychiatric/Behavioral: Negative.     Per HPI unless specifically indicated above     Objective:    BP (!) 141/90   Pulse 91   Temp 97.8 F (36.6 C)   Ht 5' 6.77" (1.696 m)   Wt 298 lb 2 oz (135.2 kg)   LMP 02/09/2017 (Within Weeks)   SpO2 98%   BMI 47.01 kg/m   Wt Readings from Last 3 Encounters:  02/27/20 298 lb 2 oz (135.2 kg)  02/12/20 292 lb 11.2 oz (132.8 kg)  01/22/20 299 lb 11.2 oz (135.9 kg)     Physical Exam Vitals and nursing note reviewed.  Constitutional:      General: She is not in acute distress.    Appearance: Normal appearance. She is obese. She is not toxic-appearing.  Cardiovascular:     Rate and Rhythm: Normal rate. Rhythm irregular.     Heart sounds: Normal heart sounds. No murmur heard.   Pulmonary:     Effort: Pulmonary effort is normal. No respiratory distress.     Breath sounds: No wheezing, rhonchi or rales.  Abdominal:     General: Abdomen is flat. Bowel sounds are normal.  Skin:    General: Skin is warm and dry.     Coloration: Skin is not jaundiced or pale.     Findings: No erythema.  Neurological:     General: No focal deficit present.     Mental Status: She is alert and oriented to person,  place, and time.     Motor: No weakness.     Comments: Walks with 2 wheeled walker  Psychiatric:        Behavior: Behavior normal.        Thought Content: Thought content normal.        Assessment & Plan:   Problem List Items Addressed This Visit      Other   BMI 40.0-44.9, adult ([place])    Ongoing.  Discussed options with referral to weight management clinic for ongoing support and possible prescription for phentermine, however patient does not desire referral today.  Desires medication to help suppress appetite and prevent bad habit of snacking.  Will trial on Saxenda 0.6 mg for 7 days, then 1.2 mg for 7 days, and increase by 0.6 until at goal of 3 mg daily.  Follow-up in 4 weeks to ensure tolerating medication well.  Prescription given for 3 months.      Relevant Medications   Liraglutide -Weight Management (SAXENDA) 18 MG/3ML SOPN    Other Visit Diagnoses    Weight loss counseling, encounter for    -  Primary       Follow up plan: Return in about 4 weeks (around 03/26/2020) for [facility] follow up.

## 2020-02-27 NOTE — Assessment & Plan Note (Signed)
Ongoing.  Discussed options with referral to weight management clinic for ongoing support and possible prescription for phentermine, however patient does not desire referral today.  Desires medication to help suppress appetite and prevent bad habit of snacking.  Will trial on Saxenda 0.6 mg for 7 days, then 1.2 mg for 7 days, and increase by 0.6 until at goal of 3 mg daily.  Follow-up in 4 weeks to ensure tolerating medication well.  Prescription given for 3 months.

## 2020-02-28 ENCOUNTER — Other Ambulatory Visit: Payer: Self-pay | Admitting: Cardiovascular Disease

## 2020-02-29 ENCOUNTER — Other Ambulatory Visit: Payer: Self-pay

## 2020-02-29 MED ORDER — NOVOFINE PEN NEEDLE 32G X 6 MM MISC: 1.0000 | Freq: Every day | 3 refills | Status: DC

## 2020-02-29 NOTE — Telephone Encounter (Signed)
RX request for needles for Saxenda.

## 2020-03-04 DIAGNOSIS — M65311 Trigger thumb, right thumb: Secondary | ICD-10-CM | POA: Diagnosis not present

## 2020-03-04 DIAGNOSIS — M79644 Pain in right finger(s): Secondary | ICD-10-CM | POA: Diagnosis not present

## 2020-03-05 DIAGNOSIS — Z0189 Encounter for other specified special examinations: Secondary | ICD-10-CM | POA: Diagnosis not present

## 2020-03-05 LAB — COMPREHENSIVE METABOLIC PANEL
Calcium: 9.3 (ref 8.7–10.7)
GFR calc Af Amer: 119
GFR calc non Af Amer: 103

## 2020-03-05 LAB — CBC: RBC: 4.93 (ref 3.87–5.11)

## 2020-03-05 LAB — CBC AND DIFFERENTIAL
HCT: 41 (ref 36–46)
Hemoglobin: 13.4 (ref 12.0–16.0)
Platelets: 365 (ref 150–399)
WBC: 8.6

## 2020-03-05 LAB — BASIC METABOLIC PANEL
BUN: 12 (ref 4–21)
Chloride: 98 — AB (ref 99–108)
Creatinine: 0.6 (ref <=1.1)
Glucose: 81
Potassium: 4.4 (ref 3.4–5.3)
Sodium: 137 (ref 137–147)

## 2020-03-05 LAB — HEPATIC FUNCTION PANEL
ALT: 13 (ref 7–35)
AST: 12 — AB (ref 13–35)
Alkaline Phosphatase: 107 (ref 25–125)
Bilirubin, Total: 0.2

## 2020-03-05 LAB — LIPID PANEL
Cholesterol: 165 (ref 0–200)
HDL: 58 (ref 35–70)
LDL Cholesterol: 87
Triglycerides: 113 (ref 40–160)

## 2020-03-05 LAB — IRON,TIBC AND FERRITIN PANEL: Iron: 66

## 2020-03-05 LAB — VITAMIN D 25 HYDROXY (VIT D DEFICIENCY, FRACTURES): Vit D, 25-Hydroxy: 52.7

## 2020-03-05 LAB — TSH: TSH: 0.91 (ref <=5.90)

## 2020-03-11 ENCOUNTER — Ambulatory Visit: Payer: BC Managed Care – PPO | Admitting: Dietician

## 2020-03-12 DIAGNOSIS — Z043 Encounter for examination and observation following other accident: Secondary | ICD-10-CM | POA: Diagnosis not present

## 2020-03-12 DIAGNOSIS — Z713 Dietary counseling and surveillance: Secondary | ICD-10-CM | POA: Diagnosis not present

## 2020-03-13 DIAGNOSIS — M65311 Trigger thumb, right thumb: Secondary | ICD-10-CM | POA: Diagnosis not present

## 2020-03-20 NOTE — Telephone Encounter (Signed)
Do you know where they are

## 2020-04-05 ENCOUNTER — Encounter: Payer: Self-pay | Admitting: Nurse Practitioner

## 2020-04-05 ENCOUNTER — Other Ambulatory Visit: Payer: Self-pay

## 2020-04-05 ENCOUNTER — Ambulatory Visit: Payer: BC Managed Care – PPO | Admitting: Nurse Practitioner

## 2020-04-05 DIAGNOSIS — Z6841 Body Mass Index (BMI) 40.0 and over, adult: Secondary | ICD-10-CM | POA: Diagnosis not present

## 2020-04-05 DIAGNOSIS — Z713 Dietary counseling and surveillance: Secondary | ICD-10-CM | POA: Diagnosis not present

## 2020-04-05 MED ORDER — LIRAGLUTIDE -WEIGHT MANAGEMENT 18 MG/3ML ~~LOC~~ SOPN: 3.0000 mg | PEN_INJECTOR | Freq: Every day | SUBCUTANEOUS | 4 refills | Status: DC

## 2020-04-05 NOTE — Patient Instructions (Signed)
Healthy Eating Following a healthy eating pattern may help you to achieve and maintain a healthy body weight, reduce the risk of chronic disease, and live a long and productive life. It is important to follow a healthy eating pattern at an appropriate calorie level for your body. Your nutritional needs should be met primarily through food by choosing a variety of nutrient-rich foods. What are tips for following this plan? Reading food labels  Read labels and choose the following: ? Reduced or low sodium. ? Juices with 100% fruit juice. ? Foods with low saturated fats and high polyunsaturated and monounsaturated fats. ? Foods with whole grains, such as whole wheat, cracked wheat, brown rice, and wild rice. ? Whole grains that are fortified with folic acid. This is recommended for women who are pregnant or who want to become pregnant.  Read labels and avoid the following: ? Foods with a lot of added sugars. These include foods that contain brown sugar, corn sweetener, corn syrup, dextrose, fructose, glucose, high-fructose corn syrup, honey, invert sugar, lactose, malt syrup, maltose, molasses, raw sugar, sucrose, trehalose, or turbinado sugar.  Do not eat more than the following amounts of added sugar per day:  6 teaspoons (25 g) for women.  9 teaspoons (38 g) for men. ? Foods that contain processed or refined starches and grains. ? Refined grain products, such as white flour, degermed cornmeal, white bread, and white rice. Shopping  Choose nutrient-rich snacks, such as vegetables, whole fruits, and nuts. Avoid high-calorie and high-sugar snacks, such as potato chips, fruit snacks, and candy.  Use oil-based dressings and spreads on foods instead of solid fats such as butter, stick margarine, or cream cheese.  Limit pre-made sauces, mixes, and "instant" products such as flavored rice, instant noodles, and ready-made pasta.  Try more plant-protein sources, such as tofu, tempeh, black beans,  edamame, lentils, nuts, and seeds.  Explore eating plans such as the Mediterranean diet or vegetarian diet. Cooking  Use oil to saut or stir-fry foods instead of solid fats such as butter, stick margarine, or lard.  Try baking, boiling, grilling, or broiling instead of frying.  Remove the fatty part of meats before cooking.  Steam vegetables in water or broth. Meal planning   At meals, imagine dividing your plate into fourths: ? One-half of your plate is fruits and vegetables. ? One-fourth of your plate is whole grains. ? One-fourth of your plate is protein, especially lean meats, poultry, eggs, tofu, beans, or nuts.  Include low-fat dairy as part of your daily diet. Lifestyle  Choose healthy options in all settings, including home, work, school, restaurants, or stores.  Prepare your food safely: ? Wash your hands after handling raw meats. ? Keep food preparation surfaces clean by regularly washing with hot, soapy water. ? Keep raw meats separate from ready-to-eat foods, such as fruits and vegetables. ? Cook seafood, meat, poultry, and eggs to the recommended internal temperature. ? Store foods at safe temperatures. In general:  Keep cold foods at 59F (4.4C) or below.  Keep hot foods at 159F (60C) or above.  Keep your freezer at [facility] (-17.8C) or below.  Foods are no longer safe to eat when they have been between the temperatures of 40-159F (4.4-60C) for more than 2 hours. What foods should I eat? Fruits Aim to eat 2 cup-equivalents of fresh, canned (in natural juice), or frozen fruits each day. Examples of 1 cup-equivalent of fruit include 1 small apple, 8 large strawberries, 1 cup canned fruit,  cup  dried fruit, or 1 cup 100% juice. Vegetables Aim to eat 2-3 cup-equivalents of fresh and frozen vegetables each day, including different varieties and colors. Examples of 1 cup-equivalent of vegetables include 2 medium carrots, 2 cups raw, leafy greens, 1 cup chopped  vegetable (raw or cooked), or 1 medium baked potato. Grains Aim to eat 6 ounce-equivalents of whole grains each day. Examples of 1 ounce-equivalent of grains include 1 slice of bread, 1 cup ready-to-eat cereal, 3 cups popcorn, or  cup cooked rice, pasta, or cereal. Meats and other proteins Aim to eat 5-6 ounce-equivalents of protein each day. Examples of 1 ounce-equivalent of protein include 1 egg, 1/2 cup nuts or seeds, or 1 tablespoon (16 g) peanut butter. A cut of meat or fish that is the size of a deck of cards is about 3-4 ounce-equivalents.  Of the protein you eat each week, try to have at least 8 ounces come from seafood. This includes salmon, trout, herring, and anchovies. Dairy Aim to eat 3 cup-equivalents of fat-free or low-fat dairy each day. Examples of 1 cup-equivalent of dairy include 1 cup (240 mL) milk, 8 ounces (250 g) yogurt, 1 ounces (44 g) natural cheese, or 1 cup (240 mL) fortified soy milk. Fats and oils  Aim for about 5 teaspoons (21 g) per day. Choose monounsaturated fats, such as canola and olive oils, avocados, peanut butter, and most nuts, or polyunsaturated fats, such as sunflower, corn, and soybean oils, walnuts, pine nuts, sesame seeds, sunflower seeds, and flaxseed. Beverages  Aim for six 8-oz glasses of water per day. Limit coffee to three to five 8-oz cups per day.  Limit caffeinated beverages that have added calories, such as soda and energy drinks.  Limit alcohol intake to no more than 1 drink a day for nonpregnant women and 2 drinks a day for men. One drink equals 12 oz of beer (355 mL), 5 oz of wine (148 mL), or 1 oz of hard liquor (44 mL). Seasoning and other foods  Avoid adding excess amounts of salt to your foods. Try flavoring foods with herbs and spices instead of salt.  Avoid adding sugar to foods.  Try using oil-based dressings, sauces, and spreads instead of solid fats. This information is based on general [place] nutrition guidelines. For more  information, visit [employer].es. Exact amounts may vary based on your nutrition needs. Summary  A healthy eating plan may help you to maintain a healthy weight, reduce the risk of chronic diseases, and stay active throughout your life.  Plan your meals. Make sure you eat the right portions of a variety of nutrient-rich foods.  Try baking, boiling, grilling, or broiling instead of frying.  Choose healthy options in all settings, including home, work, school, restaurants, or stores. This information is not intended to replace advice given to you by your health care provider. Make sure you discuss any questions you have with your health care provider. Document Revised: 06/28/2017 Document Reviewed: 06/28/2017 Elsevier Patient Education  [place].

## 2020-04-05 NOTE — Assessment & Plan Note (Signed)
Ongoing.  At this time continue Saxenda, refills sent in on this.  Plan to follow-up in 3 months.  Currently has lost 7 pounds.  Recommend continue chair exercises at home.

## 2020-04-05 NOTE — Progress Notes (Signed)
BP 105/72   Pulse 87   Temp 98.2 F (36.8 C) (Oral)   Ht 5' 7.24" (1.708 m)   Wt 291 lb 9.6 oz (132.3 kg)   LMP 02/09/2017 (Within Weeks)   SpO2 98%   BMI 45.34 kg/m    Subjective:    Patient ID: Cathy Mitchell, female    DOB: 08-11-1965, 55 y.o.   MRN: [id number]  HPI: Cathy Mitchell is a 55 y.o. female  Chief Complaint  Patient presents with  . Obesity   OBESITY: Has goal of weight loss of getting to 100 range vs her her current 200 MG range.  Taking Saxenda 2.4 MG -- last dose of this dose is tomorrow.  Then goes to 3 MG daily.  She reports tolerating this well.  Is enjoying this new medication and feels difference.  Is not weighing self at home.  Her weigh gain started with pregnancy -- this is common in women in her family per her report. She would like to get knee replacement surgery, but wishes to lose weight first. At this time has difficulty working out due to her injuries -- will not go to gym without her walker.  Her goal is to have surgery in March.  Relevant past medical, surgical, family and social history reviewed and updated as indicated. Interim medical history since our last visit reviewed. Allergies and medications reviewed and updated.  Review of Systems  Constitutional: Negative.   Respiratory: Negative.   Cardiovascular: Negative.   Neurological: Negative.   Psychiatric/Behavioral: Negative.     Per HPI unless specifically indicated above     Objective:    BP 105/72   Pulse 87   Temp 98.2 F (36.8 C) (Oral)   Ht 5' 7.24" (1.708 m)   Wt 291 lb 9.6 oz (132.3 kg)   LMP 02/09/2017 (Within Weeks)   SpO2 98%   BMI 45.34 kg/m   Wt Readings from Last 3 Encounters:  04/05/20 291 lb 9.6 oz (132.3 kg)  02/27/20 298 lb 2 oz (135.2 kg)  02/12/20 292 lb 11.2 oz (132.8 kg)    Physical Exam Vitals and nursing note reviewed.  Constitutional:      General: She is awake. She is not in acute distress.    Appearance: She is well-developed and  well-groomed. She is morbidly obese. She is not ill-appearing.  HENT:     Head: Normocephalic.     Right Ear: Hearing normal.     Left Ear: Hearing normal.  Eyes:     General: Lids are normal.        Right eye: No discharge.        Left eye: No discharge.     Conjunctiva/sclera: Conjunctivae normal.     Pupils: Pupils are equal, round, and reactive to light.  Neck:     Thyroid: No thyromegaly.     Vascular: No carotid bruit or JVD.  Cardiovascular:     Rate and Rhythm: Normal rate and regular rhythm.     Heart sounds: Normal heart sounds. No murmur heard. No gallop.   Pulmonary:     Effort: Pulmonary effort is normal. No accessory muscle usage or respiratory distress.     Breath sounds: Normal breath sounds.  Abdominal:     General: Bowel sounds are normal.     Palpations: Abdomen is soft.  Musculoskeletal:     Cervical back: Normal range of motion and neck supple.     Right lower leg: No edema.  Left lower leg: No edema.  Lymphadenopathy:     Cervical: No cervical adenopathy.  Skin:    General: Skin is warm and dry.  Neurological:     Mental Status: She is alert and oriented to person, place, and time.  Psychiatric:        Attention and Perception: Attention normal.        Mood and Affect: Mood normal.        Speech: Speech normal.        Behavior: Behavior normal. Behavior is cooperative.        Thought Content: Thought content normal.     Results for orders placed or performed in visit on 03/07/20  Iron, TIBC and Ferritin Panel  Result Value Ref Range   Iron 66   CBC and differential  Result Value Ref Range   Hemoglobin 13.4 12.0 - 16.0   HCT 41 36 - 46   Platelets 365 150 - 399   WBC 8.6   CBC  Result Value Ref Range   RBC 4.93 3.87 - 5.11  VITAMIN D 25 Hydroxy (Vit-D Deficiency, Fractures)  Result Value Ref Range   Vit D, 25-Hydroxy 92.0   Basic metabolic panel  Result Value Ref Range   Glucose 81    BUN 12 4 - 21   Creatinine 0.6 0.5 - 1.1    Potassium 4.4 3.4 - 5.3   Sodium 137 137 - 147   Chloride 98 (A) 99 - 108  Comprehensive metabolic panel  Result Value Ref Range   GFR calc Af Amer 119    GFR calc non Af Amer 103    Calcium 9.3 8.7 - 10.7  Lipid panel  Result Value Ref Range   Triglycerides 113 40 - 160   Cholesterol 165 0 - 200   HDL 58 35 - 70   LDL Cholesterol 87   Hepatic function panel  Result Value Ref Range   Alkaline Phosphatase 107 25 - 125   ALT 13 7 - 35   AST 12 (A) 13 - 35   Bilirubin, Total 0.2   TSH  Result Value Ref Range   TSH 0.91 0.41 - 5.90      Assessment & Plan:   Problem List Items Addressed This Visit      Other   BMI 40.0-44.9, adult ([place])    Ongoing.  At this time continue Saxenda, refills sent in on this.  Plan to follow-up in 3 months.  Currently has lost 7 pounds.  Recommend continue chair exercises at home.      Relevant Medications   Liraglutide -Weight Management 18 MG/3ML SOPN       Follow up plan: Return in about 3 months (around 07/04/2020) for Obesity.

## 2020-04-15 ENCOUNTER — Ambulatory Visit: Payer: BC Managed Care – PPO | Admitting: Dietician

## 2020-04-23 DIAGNOSIS — M25541 Pain in joints of right hand: Secondary | ICD-10-CM | POA: Diagnosis not present

## 2020-05-02 DIAGNOSIS — R300 Dysuria: Secondary | ICD-10-CM | POA: Diagnosis not present

## 2020-05-07 DIAGNOSIS — R300 Dysuria: Secondary | ICD-10-CM | POA: Diagnosis not present

## 2020-05-12 ENCOUNTER — Other Ambulatory Visit: Payer: Self-pay | Admitting: Nurse Practitioner

## 2020-05-12 NOTE — Telephone Encounter (Signed)
Requested Prescriptions  Pending Prescriptions Disp Refills  . celecoxib (CELEBREX) 100 MG capsule [Pharmacy Med Name: CELECOXIB 100 MG CAPSULE] 30 capsule 3    Sig: TAKE 1 CAPSULE BY MOUTH EVERY DAY AS NEEDED FOR PAIN     Analgesics:  COX2 Inhibitors Passed - 05/12/2020 12:44 AM      Passed - HGB in normal range and within 360 days    Hemoglobin  Date Value Ref Range Status  03/05/2020 13.4 12.0 - 16.0 Final  03/22/2017 13.2 11.1 - 15.9 g/dL Final         Passed - Cr in normal range and within 360 days    Creatinine  Date Value Ref Range Status  03/05/2020 0.6 0.5 - 1.1 Final  06/19/2012 0.77 0.60 - 1.30 mg/dL Final   Creatinine, Ser  Date Value Ref Range Status  03/08/2018 0.53 0.44 - 1.00 mg/dL Final         Passed - Patient is not pregnant      Passed - Valid encounter within last 12 months    Recent Outpatient Visits          1 month ago BMI 40.0-44.9, adult ([place])   [place], Henrine Screws T, NP   2 months ago Weight loss counseling, encounter for   [facility] Eulogio Bear, NP   6 months ago Osteoarthritis of knee (Bilateral) (R>L)   [facility] Kathrine Haddock, NP   8 months ago Irritable bowel syndrome with diarrhea   [facility] [place], Jolene T, NP   9 months ago Irritable bowel syndrome with diarrhea   [place], Barbaraann Faster, NP      Future Appointments            In 1 month [place], Barbaraann Faster, NP [employer]

## 2020-05-15 ENCOUNTER — Other Ambulatory Visit: Payer: Self-pay | Admitting: Nurse Practitioner

## 2020-05-15 MED ORDER — NOVOFINE PEN NEEDLE 32G X 6 MM MISC: 1.0000 | Freq: Every day | 3 refills | Status: DC

## 2020-05-27 ENCOUNTER — Ambulatory Visit: Payer: BC Managed Care – PPO | Admitting: Dietician

## 2020-06-06 ENCOUNTER — Encounter: Payer: Self-pay | Admitting: Cardiovascular Disease

## 2020-06-06 ENCOUNTER — Ambulatory Visit (INDEPENDENT_AMBULATORY_CARE_PROVIDER_SITE_OTHER): Payer: BC Managed Care – PPO

## 2020-06-06 ENCOUNTER — Encounter: Payer: BC Managed Care – PPO | Attending: Nurse Practitioner | Admitting: Dietician

## 2020-06-06 ENCOUNTER — Other Ambulatory Visit: Payer: Self-pay

## 2020-06-06 ENCOUNTER — Ambulatory Visit: Payer: BC Managed Care – PPO | Admitting: Cardiovascular Disease

## 2020-06-06 VITALS — BP 148/100 | HR 74 | Ht 69.0 in | Wt 283.0 lb

## 2020-06-06 DIAGNOSIS — G4730 Sleep apnea, unspecified: Secondary | ICD-10-CM | POA: Diagnosis not present

## 2020-06-06 DIAGNOSIS — R002 Palpitations: Secondary | ICD-10-CM | POA: Diagnosis not present

## 2020-06-06 DIAGNOSIS — R000 Tachycardia, unspecified: Secondary | ICD-10-CM

## 2020-06-06 DIAGNOSIS — I493 Ventricular premature depolarization: Secondary | ICD-10-CM | POA: Diagnosis not present

## 2020-06-06 DIAGNOSIS — Z6841 Body Mass Index (BMI) 40.0 and over, adult: Secondary | ICD-10-CM

## 2020-06-06 NOTE — Patient Instructions (Signed)
Medication Instructions:  Your physician recommends that you continue on your current medications as directed. Please refer to the Current Medication list given to you today.  *If you need a refill on your cardiac medications before your next appointment, please call your pharmacy*   Lab Work: None ordered If you have labs (blood work) drawn today and your tests are completely normal, you will receive your results only by: Marland Kitchen MyChart Message (if you have MyChart) OR . A paper copy in the mail If you have any lab test that is abnormal or we need to change your treatment, we will call you to review the results.   Testing/Procedures: Your physician has recommended that you wear a Zio monitor. (To be worn for 14 days) This monitor is a medical device that records the heart's electrical activity. Doctors most often use these monitors to diagnose arrhythmias. Arrhythmias are problems with the speed or rhythm of the heartbeat. The monitor is a small device applied to your chest. You can wear one while you do your normal daily activities. While wearing this monitor if you have any symptoms to push the button and record what you felt. Once you have worn this monitor for the period of time provider prescribed (Usually 14 days), you will return the monitor device in the postage paid box. Once it is returned they will download the data collected and provide [place] with a report which the provider will then review and we will call you with those results. Important tips:  1. Avoid showering during the first 24 hours of wearing the monitor. 2. Avoid excessive sweating to help maximize wear time. 3. Do not submerge the device, no hot tubs, and no swimming pools. 4. Keep any lotions or oils away from the patch. 5. After 24 hours you may shower with the patch on. Take brief showers with your back facing the shower head.  6. Do not remove patch once it has been placed because that will interrupt data and decrease  adhesive wear time. 7. Push the button when you have any symptoms and write down what you were feeling. 8. Once you have completed wearing your monitor, remove and place into box which has postage paid and place in your outgoing mailbox.  9. If for some reason you have misplaced your box then call our office and we can provide another box and/or mail it off for you.         Follow-Up: At [facility], you and your health needs are our priority.  As part of our continuing mission to provide you with exceptional heart care, we have created designated Provider Care Teams.  These Care Teams include your primary Cardiologist (physician) and Advanced Practice Providers (APPs -  Physician Assistants and Nurse Practitioners) who all work together to provide you with the care you need, when you need it.  We recommend signing up for the patient portal called "MyChart".  Sign up information is provided on this After Visit Summary.  MyChart is used to connect with patients for Virtual Visits (Telemedicine).  Patients are able to view lab/test results, encounter notes, upcoming appointments, etc.  Non-urgent messages can be sent to your provider as well.   To learn more about what you can do with MyChart, go to [email or web address].    Your next appointment:   Your physician wants you to follow-up in: 6 months You will receive a reminder letter in the mail two months in advance. If you don't receive a letter,  please call our office to schedule the follow-up appointment.   The format for your next appointment:   In Person  Provider:   You may see Kathlyn Sacramento, MD or one of the following Advanced Practice Providers on your designated Care Team:    Murray Hodgkins, NP  Christell Faith, PA-C  Marrianne Mood, PA-C  Cadence [place], [place]  Laurann Montana, NP    Other Instructions N/A

## 2020-06-06 NOTE — Progress Notes (Signed)
Medical Nutrition Therapy: Visit start time: 7867  end time: 6720  Visit shortened due to another medical appointment; unable to complete updates regarding pain, tobacco/ alcohol use, medication changes.  Assessment:  Diagnosis: obesity Medical history changes: none Psychosocial issues/ stress concerns: voices feelings of defeat; no longer seeing therapist because they all tell her she is too self-aware  Current weight: 288.2lbs Height: 5'9" BMI: 42.56 Medications, supplement changes: not assessed due to time limitation  Progress and evaluation:  . Patient reports starting Saxenda after previous MNT visit (02/12/20), and was losing weight steadily, but began to have side effects so stopped; has since regained some weight due to increased hunger. Weight is currently 4lbs less than previous visit. . She reports lifelong grazing pattern of eating and does now know how to stop.  . With weight gain she will likely be unable to have knee surgery; she is at high fall risk due to knee issues, so currently unable to exercise. . Patient reports some ongoing effort at healthy food choices, but she feels (since unable to take Saxenda) that she will inevitably continue to gain weight and not qualify for her knee surgery.   Physical activity: none at this time  Dietary Intake:  Usual eating pattern includes 3 meals and several snacks per day. Dining out frequency: not assessed today.  Breakfast: 2 small meals an hour apart, long term practice Snack: yogurt with blueberries and walnuts; banana with peanut butter Lunch: light meal/ snack Snack: same as am Supper: small meal/ snack Snack: same as am Beverages:   Nutrition Care Education: Topics covered:  Weight control: reviewed progress since previous visit, options of establishing a meal and snack schedule and/or tracking food intake to help control grazing; dealing with frustrations and feelings of guilt leading to feeling defeated.   Nutritional  Diagnosis:  [place]-3.3 Overweight/obesity As related to history of excess calories and inadequate physical activity due to orthopedic pain and need for surgery.  As evidenced by patient with current BMI of 42.56.  Intervention:  . Discussion as noted above. . Visit was short due to patient having another medical appointment. . Commended patient for maintaining healthy food choices and assured her that her ongoing effort at a healthy, balanced eating pattern is benefiting her overall health.  . No follow up scheduled at this time; encouraged patient to call and schedule later if she feels ready.    Learner/ who was taught:  . Patient   Level of understanding: Marland Kitchen Verbalizes/ demonstrates competency   Demonstrated degree of understanding via:   Teach back Learning barriers: . None  Willingness to learn/ readiness for change: . Eager, change in progress, but currently feels defeated/ unmotivated  Monitoring and Evaluation:  Dietary intake, exercise, and body weight      follow up: prn

## 2020-06-06 NOTE — Progress Notes (Signed)
Cardiology Office Note   Date:  06/06/2020   ID:  Cathy Mitchell, DOB [birth year], MRN [id number]  PCP:  Venita Lick, NP  Cardiologist:   Kathlyn Sacramento, MD   Chief Complaint  Patient presents with  . Follow-up    6 months      History of Present Illness: Cathy Mitchell is a 55 y.o. female who is here for a follow-up visit regarding frequent symptomatic PVCs. Previous treadmill stress test in 2016 before gastric bypass surgery was normal.   She has no history of diabetes or hyperlipidemia. She does have sleep apnea and refuses CPAP.  She has no family history of premature coronary artery disease.  She is not a smoker and does not consume excessive amount of caffeine.   Holter monitor in December 2018 showed very frequent PVCs with a total of 24,000 beats in 48 hours representing 12% burden. Echocardiogram showed normal LV systolic function with mild mitral regurgitation.   Her PVCs responded better to diltiazem than metoprolol. Repeat Holter monitor showed 5000 PVCs in 24 hours representing a 5% burden.   She comes for follow-up today and she seems to be very irritated.  She reports almost daily palpitations when she is trying to sleep described as "helicopter blades" that prevent her from sleep at night.  She seems to be very anxious.  She also seems to be indifferent.  She reports being depressed and wanting to be comfort care?!! No chest pain.   Past Medical History:  Diagnosis Date  . Anxiety   . Arthritis   . Fibromyalgia   . Hypertension    no longer has hypertension  . Neuromuscular disorder ([place])    L-sided tremors  . Sleep apnea    does not use C-PAP    Past Surgical History:  Procedure Laterality Date  . CARPAL TUNNEL RELEASE Bilateral 2007  . DILATION AND CURETTAGE OF UTERUS    . GANGLION CYST EXCISION Bilateral   . GANGLION CYST EXCISION Right 10/08/2015   Procedure: REMOVAL GANGLION OF WRIST;  Surgeon: Hessie Knows, MD;  Location: [facility] ORS;   Service: Orthopedics;  Laterality: Right;  . GASTRIC BYPASS    . HAND SURGERY Bilateral Resection  . HYSTEROSCOPY WITH D & C N/A 05/24/2017   Procedure: DILATATION AND CURETTAGE /HYSTEROSCOPY;  Surgeon: Defrancesco, Alanda Slim, MD;  Location: [facility] ORS;  Service: Gynecology;  Laterality: N/A;  . INCONTINENCE SURGERY  2009  . LEEP N/A 05/24/2017   Procedure: LOOP ELECTROSURGICAL EXCISION PROCEDURE (LEEP);  Surgeon: Brayton Mars, MD;  Location: [facility] ORS;  Service: Gynecology;  Laterality: N/A;  . TUBAL LIGATION       Current Outpatient Medications  Medication Sig Dispense Refill  . acetaminophen (TYLENOL) 500 MG tablet Take 1,500 mg by mouth daily as needed for mild pain.    . Ascorbic Acid (VITAMIN C) 1000 MG tablet Take 1,000 mg by mouth daily.    Marland Kitchen CALCIUM-MAGNESIUM-ZINC PO Take 1 Dose by mouth daily.    . celecoxib (CELEBREX) 100 MG capsule TAKE 1 CAPSULE BY MOUTH EVERY DAY AS NEEDED FOR PAIN 30 capsule 3  . Cholecalciferol (VITAMIN D3) 1.25 MG (50000 UT) CAPS TAKE 1 TABLET BY MOUTH ONCE A WEEK. FOR 8 WEEKS AND THEN STOP. RETURN TO OFFICE FOR LAB DRAW. 8 capsule 1  . cyanocobalamin 1000 MCG tablet Take 1,000 mcg by mouth daily.    Marland Kitchen dicyclomine (BENTYL) 10 MG capsule Take 10 mg by mouth 2 (two) times daily as  needed.    . dicyclomine (BENTYL) 20 MG tablet TAKE 1 TABLET BY MOUTH THREE TIMES A DAY 270 tablet 1  . diltiazem (CARDIZEM CD) 180 MG 24 hr capsule TAKE 1 CAPSULE BY MOUTH EVERY DAY 90 capsule 0  . fluticasone (FLONASE) 50 MCG/ACT nasal spray Place 2 sprays into both nostrils daily as needed for allergies. 16 g 6  . glucosamine-chondroitin 500-400 MG tablet Take 1 tablet by mouth daily.    . Insulin Pen Needle (NOVOFINE PEN NEEDLE) 32G X 6 MM MISC 1 each by Does not apply route daily. Use to inject Saxenda 100 each 3  . Liraglutide -Weight Management 18 MG/3ML SOPN Inject 3 mg into the skin daily. 35 mL 4  . meclizine (ANTIVERT) 25 MG tablet Take 1 tablet (25 mg total) by mouth  3 (three) times daily as needed for dizziness. 30 tablet 0  . Multiple Vitamins-Minerals (MULTIPLE VITAMINS/WOMENS) tablet Take 2 tablets by mouth daily. women's 50+    . Olopatadine HCl (PATADAY) 0.2 % SOLN Apply 1 drop to eye daily. Apply one drop to each eye daily as needed for dry and itchy eyes. 2.5 mL 5  . Potassium 99 MG TABS Take 99 mg by mouth daily.     No current facility-administered medications for this visit.    Allergies:   Oxycodone-acetaminophen, Other, and Percocet [oxycodone-acetaminophen]    Social History:  The patient  reports that she quit smoking about 5 years ago. Her smoking use included cigarettes. She has a 14.50 pack-year smoking history. She has never used smokeless tobacco. She reports that she does not drink alcohol and does not use drugs.   Family History:  The patient's family history includes COPD in her father; Diabetes in her paternal grandfather; Eczema in her son; Heart disease in her maternal grandfather and paternal grandfather; Thyroid disease in her daughter.    ROS:  Please see the history of present illness.   Otherwise, review of systems are positive for none.   All other systems are reviewed and negative.    PHYSICAL EXAM: VS:  BP (!) 148/100   Pulse 74   Ht 5\' 9"  (1.753 m)   Wt 283 lb (128.4 kg) Comment: pt refused to get accurate weight for visit today  LMP 02/09/2017 (Within Weeks)   BMI 41.79 kg/m  , BMI Body mass index is 41.79 kg/m. GEN: Well nourished, well developed, in no acute distress  HEENT: normal  Neck: no JVD, carotid bruits, or masses Cardiac: RRR with premature beats; no murmurs, rubs, or gallops,no edema  Respiratory:  clear to auscultation bilaterally, normal work of breathing GI: soft, nontender, nondistended, + BS MS: no deformity or atrophy  Skin: warm and dry, no rash Neuro:  Strength and sensation are intact Psych: euthymic mood, full affect   EKG:  EKG is ordered today. The ekg ordered today demonstrates  sinus rhythm .  Incomplete right bundle branch block.   Recent Labs: 03/05/2020: ALT 13; BUN 12; Creatinine 0.6; Hemoglobin 13.4; Platelets 365; Potassium 4.4; Sodium 137; TSH 0.91    Lipid Panel    Component Value Date/Time   CHOL 165 03/05/2020 0000   CHOL 145 07/31/2016 1110   TRIG 113 03/05/2020 0000   HDL 58 03/05/2020 0000   HDL 48 07/31/2016 1110   CHOLHDL 4.3 12/03/2014 1619   VLDL 27 12/03/2014 1619   LDLCALC 87 03/05/2020 0000   LDLCALC 73 07/31/2016 1110      Wt Readings from Last 3 Encounters:  06/06/20 283 lb (128.4 kg)  06/06/20 288 lb 3.2 oz (130.7 kg)  04/05/20 291 lb 9.6 oz (132.3 kg)       PAD Screen 03/02/2017  Previous PAD dx? No  Previous surgical procedure? No  Pain with walking? No  Feet/toe relief with dangling? No  Painful, non-healing ulcers? No  Extremities discolored? No      ASSESSMENT AND PLAN:  1. Symptomatic PVCs: No significant PVCs noted by exam or EKG.  However, she reports almost daily palpitations at night event and her from sleep.  I am going to obtain a 2-week ZIO monitor.    2. Sleep apnea: She reports intolerance to CPAP.  3.  Anxiety and depression: She seems to be very anxious today and might also be depressed.  She denies suicidal thoughts but she is insisting on " being made comfort care or fix my problems".  She might need treatment for this.  I will forward to primary care physician.   Disposition:   FU with me in 6 months  Signed,  Kathlyn Sacramento, MD  06/06/2020 2:28 PM    [facility]

## 2020-06-10 ENCOUNTER — Other Ambulatory Visit: Payer: Self-pay | Admitting: Cardiovascular Disease

## 2020-06-11 ENCOUNTER — Ambulatory Visit: Payer: BC Managed Care – PPO | Admitting: Nurse Practitioner

## 2020-06-11 ENCOUNTER — Other Ambulatory Visit: Payer: Self-pay

## 2020-06-11 ENCOUNTER — Encounter: Payer: Self-pay | Admitting: Nurse Practitioner

## 2020-06-11 VITALS — BP 128/86 | HR 71 | Temp 97.9°F | Wt 288.0 lb

## 2020-06-11 DIAGNOSIS — F331 Major depressive disorder, recurrent, moderate: Secondary | ICD-10-CM | POA: Diagnosis not present

## 2020-06-11 DIAGNOSIS — Z6841 Body Mass Index (BMI) 40.0 and over, adult: Secondary | ICD-10-CM | POA: Diagnosis not present

## 2020-06-11 DIAGNOSIS — F32A Depression, unspecified: Secondary | ICD-10-CM | POA: Insufficient documentation

## 2020-06-11 MED ORDER — BUPROPION HCL ER (XL) 150 MG PO TB24: 150.0000 mg | ORAL_TABLET | Freq: Every day | ORAL | 4 refills | Status: DC

## 2020-06-11 NOTE — Patient Instructions (Signed)

## 2020-06-11 NOTE — Assessment & Plan Note (Signed)
History of depression and anxiety, at this time would prefer to avoid medication that could cause weight gain.  Will trial Wellbutrin 150 MG XL dosing once daily to start, this may benefit mood and no have weight gain concern.  Recommend she start taking daily and continue therapy sessions, as would be beneficial.  She reports history of chronic PTSD diagnosis in past.  Denies SI/HI at this time.  Return in 4 weeks for follow-up, sooner if worsening mood.

## 2020-06-11 NOTE — Progress Notes (Signed)
BP 128/86    Pulse 71    Temp 97.9 F (36.6 C) (Oral)    Wt 288 lb (130.6 kg)    LMP 02/09/2017 (Within Weeks)    SpO2 98%    BMI 42.53 kg/m    Subjective:    Patient ID: Cathy Mitchell, female    DOB: 1966/01/11, 55 y.o.   MRN: [id number]  HPI: Cathy Mitchell is a 55 y.o. female  Chief Complaint  Patient presents with   Anxiety   Medication Reaction   DEPRESSION/ANXIETY/STRESS Recently saw cardiology on 06/06/2020, currently wearing Holter monitor.  She is struggling with her son's illness -- mediterranean fever -- constantly gripped with pain when eating and she is working on monitoring diet to ensure he does not take in anything that he is food sensitive too -- she endorses this as difficult since he has multiple allergies.  Reports frustration over not being able to help him, but at same time feels guilty for resenting him.  Also reports frustration over not being able to get knee surgery -- due to inability to Mitchell weight, and frustration at ortho providers for "not being honest with me" as she feels she wasted lots of time with them.  Goes to therapy via video therapy -- does "not bother" anymore as was told she was "too truthful" and "too much a realist" and did not find it beneficial.  Reports frustration with weight and being unable to Mitchell weight + people always telling her "how good my labs are".  Feels she could just curl up in bed and sleep all day, which she reports she has done before in past.  Denies suicidal ideation or homicidal ideation, but does report "if anything happened to one of my children, then I would not want to be here".  Recent labs noted TSH 0.91. Duration:uncontrolled Anxious mood: yes  Excessive worrying: yes Irritability: yes  Sweating: no Nausea: no Palpitations:no Hyperventilation: no Panic attacks: no Agoraphobia: no  Obscessions/compulsions: no Depressed mood: yes Depression screen [facility] 2/9 06/11/2020 04/05/2020 01/22/2020 09/07/2019 11/22/2017   Decreased Interest 1 3 3 1  0  Down, Depressed, Hopeless 3 3 3 1  0  PHQ - 2 Score 4 6 6 2  0  Altered sleeping 0 3 - - -  Tired, decreased energy 1 3 - - -  Change in appetite 2 3 - - -  Feeling bad or failure about yourself  3 3 - - -  Trouble concentrating 3 3 - - -  Moving slowly or fidgety/restless 0 2 - - -  Suicidal thoughts 0 0 - - -  PHQ-9 Score 13 23 - - -  Difficult doing work/chores - Very difficult - - -   Anhedonia: no Weight changes: no Insomnia: yes hard to stay asleep  Hypersomnia: no Fatigue/loss of energy: yes Feelings of worthlessness: yes Feelings of guilt: yes Impaired concentration/indecisiveness: yes Suicidal ideations: no  Crying spells: yes Recent Stressors/Life Changes: yes   Relationship problems: no   Family stress: yes     Financial stress: no    Job stress: no    Recent death/loss: no  Relevant past medical, surgical, family and social history reviewed and updated as indicated. Interim medical history since our last visit reviewed. Allergies and medications reviewed and updated.  Review of Systems  Constitutional: Negative for activity change, appetite change, diaphoresis, fatigue and unexpected weight change.  Respiratory: Negative.   Cardiovascular: Negative.   Gastrointestinal: Negative.   Neurological: Negative.  Psychiatric/Behavioral: Positive for decreased concentration and sleep disturbance. Negative for self-injury and suicidal ideas. The patient is nervous/anxious.     Per HPI unless specifically indicated above     Objective:    BP 128/86    Pulse 71    Temp 97.9 F (36.6 C) (Oral)    Wt 288 lb (130.6 kg)    LMP 02/09/2017 (Within Weeks)    SpO2 98%    BMI 42.53 kg/m   Wt Readings from Last 3 Encounters:  06/11/20 288 lb (130.6 kg)  06/06/20 283 lb (128.4 kg)  06/06/20 288 lb 3.2 oz (130.7 kg)    Physical Exam Vitals and nursing note reviewed.  Constitutional:      General: She is awake. She is not in acute  distress.    Appearance: She is well-developed and well-groomed. She is morbidly obese. She is not ill-appearing.  HENT:     Head: Normocephalic.     Right Ear: Hearing normal.     Left Ear: Hearing normal.  Eyes:     General: Lids are normal.        Right eye: No discharge.        Left eye: No discharge.     Conjunctiva/sclera: Conjunctivae normal.     Pupils: Pupils are equal, round, and reactive to light.  Neck:     Thyroid: No thyromegaly.     Vascular: No carotid bruit or JVD.  Cardiovascular:     Rate and Rhythm: Normal rate and regular rhythm.     Heart sounds: Normal heart sounds. No murmur heard. No gallop.   Pulmonary:     Effort: Pulmonary effort is normal. No accessory muscle usage or respiratory distress.     Breath sounds: Normal breath sounds.  Abdominal:     General: Bowel sounds are normal.     Palpations: Abdomen is soft.  Musculoskeletal:     Cervical back: Normal range of motion and neck supple.     Right lower leg: No edema.     Left lower leg: No edema.  Lymphadenopathy:     Cervical: No cervical adenopathy.  Skin:    General: Skin is warm and dry.  Neurological:     Mental Status: She is alert and oriented to person, place, and time.  Psychiatric:        Attention and Perception: Attention normal.        Mood and Affect: Affect is flat and tearful.        Speech: Speech normal.        Behavior: Behavior normal. Behavior is cooperative.        Thought Content: Thought content normal. Thought content does not include suicidal plan.    Results for orders placed or performed in visit on 03/07/20  Iron, TIBC and Ferritin Panel  Result Value Ref Range   Iron 66   CBC and differential  Result Value Ref Range   Hemoglobin 13.4 12.0 - 16.0   HCT 41 36 - 46   Platelets 365 150 - 399   WBC 8.6   CBC  Result Value Ref Range   RBC 4.93 3.87 - 5.11  VITAMIN D 25 Hydroxy (Vit-D Deficiency, Fractures)  Result Value Ref Range   Vit D, 25-Hydroxy 16.9    Basic metabolic panel  Result Value Ref Range   Glucose 81    BUN 12 4 - 21   Creatinine 0.6 0.5 - 1.1   Potassium 4.4 3.4 - 5.3  Sodium 137 137 - 147   Chloride 98 (A) 99 - 108  Comprehensive metabolic panel  Result Value Ref Range   GFR calc Af Amer 119    GFR calc non Af Amer 103    Calcium 9.3 8.7 - 10.7  Lipid panel  Result Value Ref Range   Triglycerides 113 40 - 160   Cholesterol 165 0 - 200   HDL 58 35 - 70   LDL Cholesterol 87   Hepatic function panel  Result Value Ref Range   Alkaline Phosphatase 107 25 - 125   ALT 13 7 - 35   AST 12 (A) 13 - 35   Bilirubin, Total 0.2   TSH  Result Value Ref Range   TSH 0.91 0.41 - 5.90      Assessment & Plan:   Problem List Items Addressed This Visit      Other   Obesity    Ongoing and has major frustrations with inability to Mitchell weight + effect on ability to obtain knee surgery.  At this time will continue Victoza, has recent refills sent in.  Trial Wellbutrin for mood, which may also benefit weight.  Referral to medical weight management in [place] placed and recommend she try a visit with them to further discuss options and have full work-up with them.  Return in 4 weeks for follow-up.      BMI 40.0-44.9, adult [facility])    Refer to obesity plan of care.      Relevant Orders   Amb Ref to Medical Weight Management   Depression - Primary    History of depression and anxiety, at this time would prefer to avoid medication that could cause weight gain.  Will trial Wellbutrin 150 MG XL dosing once daily to start, this may benefit mood and no have weight gain concern.  Recommend she start taking daily and continue therapy sessions, as would be beneficial.  She reports history of chronic PTSD diagnosis in past.  Denies SI/HI at this time.  Return in 4 weeks for follow-up, sooner if worsening mood.      Relevant Medications   buPROPion (WELLBUTRIN XL) 150 MG 24 hr tablet      Time: 35 minutes, >50% spent counseling and  therapeutic listening   Follow up plan: Return in about 4 weeks (around 07/09/2020) for MOOD.

## 2020-06-11 NOTE — Assessment & Plan Note (Signed)
Refer to obesity plan of care. 

## 2020-06-11 NOTE — Assessment & Plan Note (Signed)
Ongoing and has major frustrations with inability to lose weight + effect on ability to obtain knee surgery.  At this time will continue Victoza, has recent refills sent in.  Trial Wellbutrin for mood, which may also benefit weight.  Referral to medical weight management in [place] placed and recommend she try a visit with them to further discuss options and have full work-up with them.  Return in 4 weeks for follow-up.

## 2020-06-19 ENCOUNTER — Other Ambulatory Visit: Payer: Self-pay | Admitting: Nurse Practitioner

## 2020-06-19 NOTE — Telephone Encounter (Signed)
Requested medications are due for refill today. unknown  Requested medications are on the active medications list.  yes  Last refill. 01/01/2020  Future visit scheduled.   yes  Notes to clinic.  Medication not delegated.

## 2020-06-19 NOTE — Telephone Encounter (Signed)
Routing to provider  

## 2020-07-01 DIAGNOSIS — R002 Palpitations: Secondary | ICD-10-CM | POA: Diagnosis not present

## 2020-07-01 DIAGNOSIS — R000 Tachycardia, unspecified: Secondary | ICD-10-CM | POA: Diagnosis not present

## 2020-07-04 ENCOUNTER — Telehealth: Payer: Self-pay

## 2020-07-04 MED ORDER — DILTIAZEM HCL ER COATED BEADS 240 MG PO CP24: 240.0000 mg | ORAL_CAPSULE | Freq: Every day | ORAL | 2 refills | Status: DC

## 2020-07-04 NOTE — Telephone Encounter (Signed)
-----   Message from Wellington Hampshire, MD sent at 07/04/2020  1:16 PM EDT ----- Inform patient that monitor showed frequent PVCs.  Recommend increasing diltiazem extended release to 240 mg once daily.

## 2020-07-04 NOTE — Telephone Encounter (Addendum)
Patient made aware of zio results and Dr. Tyrell Antonio recommendation. Patient is agreeable with increasing Diltiazem to 240 mg daily. Rx has been sent to the patients pharmacy. Adv the patient to f/u as planned, she is to contact the office sooner if needed. Patient verbalized understanding and voiced appreciation for the call.

## 2020-07-08 ENCOUNTER — Telehealth (INDEPENDENT_AMBULATORY_CARE_PROVIDER_SITE_OTHER): Payer: Self-pay

## 2020-07-08 ENCOUNTER — Ambulatory Visit: Payer: BC Managed Care – PPO | Admitting: Nurse Practitioner

## 2020-07-10 ENCOUNTER — Telehealth: Payer: Self-pay

## 2020-07-10 ENCOUNTER — Ambulatory Visit (INDEPENDENT_AMBULATORY_CARE_PROVIDER_SITE_OTHER): Payer: BC Managed Care – PPO | Admitting: Nurse Practitioner

## 2020-07-10 ENCOUNTER — Other Ambulatory Visit: Payer: Self-pay | Admitting: Nurse Practitioner

## 2020-07-10 ENCOUNTER — Encounter: Payer: Self-pay | Admitting: Nurse Practitioner

## 2020-07-10 DIAGNOSIS — F331 Major depressive disorder, recurrent, moderate: Secondary | ICD-10-CM

## 2020-07-10 DIAGNOSIS — Z6841 Body Mass Index (BMI) 40.0 and over, adult: Secondary | ICD-10-CM

## 2020-07-10 DIAGNOSIS — F418 Other specified anxiety disorders: Secondary | ICD-10-CM

## 2020-07-10 DIAGNOSIS — E66813 Obesity, class 3: Secondary | ICD-10-CM

## 2020-07-10 MED ORDER — BUSPIRONE HCL 5 MG PO TABS: 10.0000 mg | ORAL_TABLET | Freq: Every day | ORAL | 4 refills | Status: DC

## 2020-07-10 MED ORDER — BUPROPION HCL ER (XL) 300 MG PO TB24: 300.0000 mg | ORAL_TABLET | Freq: Every day | ORAL | 4 refills | Status: DC

## 2020-07-10 NOTE — Patient Instructions (Signed)
[email or web address].[email or web address]">  Generalized Anxiety Disorder, Adult Generalized anxiety disorder (GAD) is a mental health condition. Unlike normal worries, anxiety related to GAD is not triggered by a specific event. These worries do not fade or get better with time. GAD interferes with relationships, work, and school. GAD symptoms can vary from mild to severe. People with severe GAD can have intense waves of anxiety with physical symptoms that are similar to panic attacks. What are the causes? The exact cause of GAD is not known, but the following are believed to have an impact:  Differences in natural brain chemicals.  Genes passed down from parents to children.  Differences in the way threats are perceived.  Development during childhood.  Personality. What increases the risk? The following factors may make you more likely to develop this condition:  Being female.  Having a family history of anxiety disorders.  Being very shy.  Experiencing very stressful life events, such as the death of a loved one.  Having a very stressful family environment. What are the signs or symptoms? People with GAD often worry excessively about many things in their lives, such as their health and family. Symptoms may also include:  Mental and emotional symptoms: ? Worrying excessively about natural disasters. ? Fear of being late. ? Difficulty concentrating. ? Fears that others are judging your performance.  Physical symptoms: ? Fatigue. ? Headaches, muscle tension, muscle twitches, trembling, or feeling shaky. ? Feeling like your heart is pounding or beating very fast. ? Feeling out of breath or like you cannot take a deep breath. ? Having trouble falling asleep or staying asleep, or experiencing restlessness. ? Sweating. ? Nausea, diarrhea, or irritable bowel syndrome (IBS).  Behavioral symptoms: ? Experiencing erratic moods or irritability. ? Avoidance of new situations. ? Avoidance of  people. ? Extreme difficulty making decisions. How is this diagnosed? This condition is diagnosed based on your symptoms and medical history. You will also have a physical exam. Your health care provider may perform tests to rule out other possible causes of your symptoms. To be diagnosed with GAD, a person must have anxiety that:  Is out of his or her control.  Affects several different aspects of his or her life, such as work and relationships.  Causes distress that makes him or her unable to take part in normal activities.  Includes at least three symptoms of GAD, such as restlessness, fatigue, trouble concentrating, irritability, muscle tension, or sleep problems. Before your health care provider can confirm a diagnosis of GAD, these symptoms must be present more days than they are not, and they must last for 6 months or longer. How is this treated? This condition may be treated with:  Medicine. Antidepressant medicine is usually prescribed for long-term daily control. Anti-anxiety medicines may be added in severe cases, especially when panic attacks occur.  Talk therapy (psychotherapy). Certain types of talk therapy can be helpful in treating GAD by providing support, education, and guidance. Options include: ? Cognitive behavioral therapy (CBT). People learn coping skills and self-calming techniques to ease their physical symptoms. They learn to identify unrealistic thoughts and behaviors and to replace them with more appropriate thoughts and behaviors. ? Acceptance and commitment therapy (ACT). This treatment teaches people how to be mindful as a way to cope with unwanted thoughts and feelings. ? Biofeedback. This process trains you to manage your body's response (physiological response) through breathing techniques and relaxation methods. You will work with a therapist while machines are used to monitor your physical   symptoms.  Stress management techniques. These include yoga,  meditation, and exercise. A mental health specialist can help determine which treatment is best for you. Some people see improvement with one type of therapy. However, other people require a combination of therapies.   Follow these instructions at home: Lifestyle  Maintain a consistent routine and schedule.  Anticipate stressful situations. Create a plan, and allow extra time to work with your plan.  Practice stress management or self-calming techniques that you have learned from your therapist or your health care provider. General instructions  Take over-the-counter and prescription medicines only as told by your health care provider.  Understand that you are likely to have setbacks. Accept this and be kind to yourself as you persist to take better care of yourself.  Recognize and accept your accomplishments, even if you judge them as small.  Keep all follow-up visits as told by your health care provider. This is important. Contact a health care provider if:  Your symptoms do not get better.  Your symptoms get worse.  You have signs of depression, such as: ? A persistently sad or irritable mood. ? Loss of enjoyment in activities that used to bring you joy. ? Change in weight or eating. ? Changes in sleeping habits. ? Avoiding friends or family members. ? Loss of energy for normal tasks. ? Feelings of guilt or worthlessness. Get help right away if:  You have serious thoughts about hurting yourself or others. If you ever feel like you may hurt yourself or others, or have thoughts about taking your own life, get help right away. Go to your nearest emergency department or:  Call your local emergency services (911 in the [place]  Call a suicide crisis helpline, such as the National Suicide Prevention Lifeline at 1-800-273-8255. This is open 24 hours a day in the [place]  Text the Crisis Text Line at 741741 (in the [place] Summary  Generalized anxiety disorder (GAD) is a mental  health condition that involves worry that is not triggered by a specific event.  People with GAD often worry excessively about many things in their lives, such as their health and family.  GAD may cause symptoms such as restlessness, trouble concentrating, sleep problems, frequent sweating, nausea, diarrhea, headaches, and trembling or muscle twitching.  A mental health specialist can help determine which treatment is best for you. Some people see improvement with one type of therapy. However, other people require a combination of therapies. This information is not intended to replace advice given to you by your health care provider. Make sure you discuss any questions you have with your health care provider. Document Revised: 01/04/2019 Document Reviewed: 01/04/2019 Elsevier Patient Education  2021 [employer].  

## 2020-07-10 NOTE — Telephone Encounter (Signed)
Pt sated she did not get th Buspar 5mg  that was not sent to the pharmamacy  and she only got the 300 mg and the celecoxib (CELEBREX) 100 MG capsule.Please advise.

## 2020-07-10 NOTE — Assessment & Plan Note (Signed)
Refer to obesity plan of care. 

## 2020-07-10 NOTE — Telephone Encounter (Signed)
I sent in early, resent now to [facility], if any issues let me know:)

## 2020-07-10 NOTE — Assessment & Plan Note (Signed)
Ongoing and has major frustrations with inability to lose weight + effect on ability to obtain knee surgery.  She stopped Victoza as made her body feel bad.  She is scheduled to see weight management upcoming, will review note when available.

## 2020-07-10 NOTE — Telephone Encounter (Addendum)
Called to notify patient and unable to leave message for patient due to mailbox being full.

## 2020-07-10 NOTE — Progress Notes (Signed)
LMP 02/09/2017 (Within Weeks)    Subjective:    Patient ID: Cathy Mitchell, female    DOB: Jan 21, 1966, 55 y.o.   MRN: [id number]  HPI: Cathy Mitchell is a 55 y.o. female  Chief Complaint  Patient presents with  . Anxiety    Patient states she is doing ok. Patient states the medication helped her the first two weeks and then the last week she says the anxiety has crept back and feels like it is getting worse every day.     . This visit was completed via telephone due to the restrictions of the COVID-19 pandemic. All issues as above were discussed and addressed but no physical exam was performed. If it was felt that the patient should be evaluated in the office, they were directed there. The patient verbally consented to this visit. Patient was unable to complete an audio/visual visit due to Technical difficulties, Lack of internet. Due to the catastrophic nature of the COVID-19 pandemic, this visit was done through audio contact only. . Location of the patient: home . Location of the provider: home . Those involved with this call:  . Provider: Marnee Guarneri, DNP . CMA: Irena Reichmann, CMA . Front Desk/Registration: Jill Side  . Time spent on call: 21 minutes on the phone discussing health concerns. 15 minutes total spent in review of patient's record and preparation of their chart.  . I verified patient identity using two factors (patient name and date of birth). Patient consents verbally to being seen via telemedicine visit today.   DEPRESSION/ANXIETY/STRESS Follow-up for anxiety and depression -- started on Wellbutrin 150MG  XL daily last visit. This initially worked well, until past week the anxiety has returned.  She is struggling with her son's illness -- mediterranean fever -- she endorses this as difficult since he has multiple allergies.  Reports frustration over not being able to help him, but at same time feels guilty for resenting him.  Also reports frustration over not  being able to get knee surgery -- due to inability to lose weight, and frustration at ortho providers.  Did go to therapy via video therapy -- but stopped due to not being beneficial.  Reports frustration with weight and being unable to lose weight.  Feels she could just curl up in bed and sleep all day, which she reports she has done before in past.  Denies suicidal ideation or homicidal ideation, but does report "if anything happened to one of my children, then I would not want to be here". She is going to weight management next week to discuss options for weight loss -- she is working towards more vegan diet.  Recent labs noted TSH 0.91. Duration:uncontrolled Anxious mood: yes  Excessive worrying: yes Irritability: yes  Sweating: no Nausea: no Palpitations:no Hyperventilation: no Panic attacks: no Agoraphobia: no  Obscessions/compulsions: no Depressed mood: yes Depression screen [facility] 2/9 07/10/2020 06/11/2020 04/05/2020 01/22/2020 09/07/2019  Decreased Interest 3 1 3 3 1   Down, Depressed, Hopeless 3 3 3 3 1   PHQ - 2 Score 6 4 6 6 2   Altered sleeping 3 0 3 - -  Tired, decreased energy 3 1 3  - -  Change in appetite 3 2 3  - -  Feeling bad or failure about yourself  3 3 3  - -  Trouble concentrating 3 3 3  - -  Moving slowly or fidgety/restless 0 0 2 - -  Suicidal thoughts 0 0 0 - -  PHQ-9 Score 21 13 23  - -  Difficult doing work/chores - - Very difficult - -   Anhedonia: no Weight changes: no Insomnia: yes hard to stay asleep  Hypersomnia: no Fatigue/loss of energy: yes Feelings of worthlessness: yes Feelings of guilt: yes Impaired concentration/indecisiveness: yes Suicidal ideations: no  Crying spells: yes Recent Stressors/Life Changes: yes   Relationship problems: no   Family stress: yes     Financial stress: no    Job stress: no    Recent death/loss: no GAD 7 : Generalized Anxiety Score 07/10/2020  Nervous, Anxious, on Edge 3  Control/stop worrying 3  Worry too much -  different things 2  Trouble relaxing 2  Restless 1  Easily annoyed or irritable 2  Afraid - awful might happen 2  Total GAD 7 Score 15  Anxiety Difficulty Very difficult   Relevant past medical, surgical, family and social history reviewed and updated as indicated. Interim medical history since our last visit reviewed. Allergies and medications reviewed and updated.  Review of Systems  Constitutional: Negative for activity change, appetite change, diaphoresis, fatigue and unexpected weight change.  Respiratory: Negative.   Cardiovascular: Negative.   Gastrointestinal: Negative.   Neurological: Negative.   Psychiatric/Behavioral: Positive for decreased concentration and sleep disturbance. Negative for self-injury and suicidal ideas. The patient is nervous/anxious.     Per HPI unless specifically indicated above     Objective:    LMP 02/09/2017 (Within Weeks)   Wt Readings from Last 3 Encounters:  06/11/20 288 lb (130.6 kg)  06/06/20 283 lb (128.4 kg)  06/06/20 288 lb 3.2 oz (130.7 kg)    Physical Exam   Unable to perform due to telephone visit only.  Results for orders placed or performed in visit on 03/07/20  Iron, TIBC and Ferritin Panel  Result Value Ref Range   Iron 66   CBC and differential  Result Value Ref Range   Hemoglobin 13.4 12.0 - 16.0   HCT 41 36 - 46   Platelets 365 150 - 399   WBC 8.6   CBC  Result Value Ref Range   RBC 4.93 3.87 - 5.11  VITAMIN D 25 Hydroxy (Vit-D Deficiency, Fractures)  Result Value Ref Range   Vit D, 25-Hydroxy 54.2   Basic metabolic panel  Result Value Ref Range   Glucose 81    BUN 12 4 - 21   Creatinine 0.6 0.5 - 1.1   Potassium 4.4 3.4 - 5.3   Sodium 137 137 - 147   Chloride 98 (A) 99 - 108  Comprehensive metabolic panel  Result Value Ref Range   GFR calc Af Amer 119    GFR calc non Af Amer 103    Calcium 9.3 8.7 - 10.7  Lipid panel  Result Value Ref Range   Triglycerides 113 40 - 160   Cholesterol 165 0 - 200    HDL 58 35 - 70   LDL Cholesterol 87   Hepatic function panel  Result Value Ref Range   Alkaline Phosphatase 107 25 - 125   ALT 13 7 - 35   AST 12 (A) 13 - 35   Bilirubin, Total 0.2   TSH  Result Value Ref Range   TSH 0.91 0.41 - 5.90      Assessment & Plan:   Problem List Items Addressed This Visit      Other   Obesity    Ongoing and has major frustrations with inability to lose weight + effect on ability to obtain knee surgery.  She stopped  Victoza as made her body feel bad.  She is scheduled to see weight management upcoming, will review note when available.      BMI 40.0-44.9, adult [facility])    Refer to obesity plan of care.      Depression - Primary    History of depression and anxiety, at this time would prefer to avoid medication that could cause weight gain.  Will increase Wellbutrin to 300 MG XL dosing once daily as initially offering benefit at 150 MG, this may benefit mood and no have weight gain concern.  Add on Buspar 10 MG daily and adjust as needed.  Recommend she start taking daily and continue therapy sessions, as would be beneficial.  She reports history of chronic PTSD diagnosis in past.  Denies SI/HI at this time.  Return in 4 weeks for follow-up, sooner if worsening mood.      Relevant Medications   buPROPion (WELLBUTRIN XL) 300 MG 24 hr tablet   busPIRone (BUSPAR) 5 MG tablet   Situational anxiety    Refer to depression plan of care.      Relevant Medications   buPROPion (WELLBUTRIN XL) 300 MG 24 hr tablet   busPIRone (BUSPAR) 5 MG tablet     I discussed the assessment and treatment plan with the patient. The patient was provided an opportunity to ask questions and all were answered. The patient agreed with the plan and demonstrated an understanding of the instructions.   The patient was advised to call back or seek an in-person evaluation if the symptoms worsen or if the condition fails to improve as anticipated.   I provided 21+ minutes of time  during this encounter.  Follow up plan: Return in about 4 weeks (around 08/07/2020) for Anxiety.

## 2020-07-10 NOTE — Assessment & Plan Note (Signed)
History of depression and anxiety, at this time would prefer to avoid medication that could cause weight gain.  Will increase Wellbutrin to 300 MG XL dosing once daily as initially offering benefit at 150 MG, this may benefit mood and no have weight gain concern.  Add on Buspar 10 MG daily and adjust as needed.  Recommend she start taking daily and continue therapy sessions, as would be beneficial.  She reports history of chronic PTSD diagnosis in past.  Denies SI/HI at this time.  Return in 4 weeks for follow-up, sooner if worsening mood.

## 2020-07-10 NOTE — Assessment & Plan Note (Signed)
Refer to depression plan of care. 

## 2020-07-11 NOTE — Telephone Encounter (Signed)
Patient notified and verbalized understanding. 

## 2020-07-23 ENCOUNTER — Other Ambulatory Visit: Payer: Self-pay

## 2020-07-23 ENCOUNTER — Ambulatory Visit (INDEPENDENT_AMBULATORY_CARE_PROVIDER_SITE_OTHER): Payer: BC Managed Care – PPO | Admitting: Family Medicine

## 2020-07-23 ENCOUNTER — Encounter (INDEPENDENT_AMBULATORY_CARE_PROVIDER_SITE_OTHER): Payer: Self-pay | Admitting: Family Medicine

## 2020-07-23 VITALS — BP 144/76 | HR 61 | Temp 98.3°F | Wt 281.0 lb

## 2020-07-23 DIAGNOSIS — M170 Bilateral primary osteoarthritis of knee: Secondary | ICD-10-CM | POA: Diagnosis not present

## 2020-07-23 DIAGNOSIS — R5383 Other fatigue: Secondary | ICD-10-CM

## 2020-07-23 DIAGNOSIS — Z0289 Encounter for other administrative examinations: Secondary | ICD-10-CM

## 2020-07-23 DIAGNOSIS — Z0189 Encounter for other specified special examinations: Secondary | ICD-10-CM | POA: Diagnosis not present

## 2020-07-23 DIAGNOSIS — Z1331 Encounter for screening for depression: Secondary | ICD-10-CM

## 2020-07-23 DIAGNOSIS — Z6841 Body Mass Index (BMI) 40.0 and over, adult: Secondary | ICD-10-CM

## 2020-07-23 DIAGNOSIS — R0602 Shortness of breath: Secondary | ICD-10-CM

## 2020-07-23 LAB — CBC AND DIFFERENTIAL
HCT: 41 (ref 36–46)
Hemoglobin: 13.1 (ref 12.0–16.0)
Platelets: 373 (ref 150–399)
WBC: 7.1

## 2020-07-23 LAB — BASIC METABOLIC PANEL
BUN: 7 (ref 4–21)
CO2: 25 — AB (ref 13–22)
Chloride: 99 (ref 99–108)
Creatinine: 0.8 (ref 0.5–1.1)
Glucose: 92
Potassium: 4.2 (ref 3.4–5.3)
Sodium: 138 (ref 137–147)

## 2020-07-23 LAB — LIPID PANEL
HDL: 56 (ref 35–70)
LDL Cholesterol: 74
LDl/HDL Ratio: 1.3
Triglycerides: 113 (ref 40–160)

## 2020-07-23 LAB — HEMOGLOBIN A1C: Hemoglobin A1C: 5.5

## 2020-07-23 LAB — VITAMIN D 25 HYDROXY (VIT D DEFICIENCY, FRACTURES): Vit D, 25-Hydroxy: 38.6

## 2020-07-23 LAB — CBC: RBC: 4.75 (ref 3.87–5.11)

## 2020-07-24 NOTE — Progress Notes (Signed)
Chief Complaint:   OBESITY Cathy Mitchell (MR# [id number]) is a 55 y.o. female who presents for evaluation and treatment of obesity and related comorbidities. Current BMI is Body mass index is 41.5 kg/m. Cathy Mitchell has been struggling with her weight for many years and has been unsuccessful in either losing weight, maintaining weight loss, or reaching her healthy weight goal.  Cathy Mitchell states she is here today to start weight loss medications. She is specifically not interested in nutritional education as she feels she has enough knowledge. She wants to exercise but she is unable to do everything else that she wants due to severe bilateral knee pain.  Cathy Mitchell's habits were reviewed today and are as follows: her desired weight loss is to be in the 100's, she has been heavy most of her life, she started gaining weight after the birth of her first child, her heaviest weight ever was 287 pounds, she has significant food cravings issues, she snacks frequently in the evenings, she wakes up frequently in the middle of the night to eat, she is trying to follow a vegan diet, she frequently makes poor food choices, she has problems with excessive hunger, she has binge eating behaviors and she struggles with emotional eating.  Depression Screen Cathy Mitchell's Food and Mood (modified PHQ-9) score was 25.  Depression screen [facility] 2/9 07/23/2020  Decreased Interest 2  Down, Depressed, Hopeless 3  PHQ - 2 Score 5  Altered sleeping 3  Tired, decreased energy 3  Change in appetite 3  Feeling bad or failure about yourself  3  Trouble concentrating 3  Moving slowly or fidgety/restless 3  Suicidal thoughts 2  PHQ-9 Score 25  Difficult doing work/chores Very difficult   Subjective:   1. Other fatigue Cathy Mitchell denies daytime somnolence and denies waking up still tired. Patent has a history of symptoms of Epworth sleepiness scale. Cathy Mitchell generally gets 2 or 3 hours of sleep per night, and states that she has difficulty  falling asleep and nightime awakenings. Snoring is not present. Apneic episodes are present. Epworth Sleepiness Score is 0.  2. Shortness of breath on exertion Cathy Mitchell notes increasing shortness of breath with exercising and seems to be worsening over time with weight gain. She notes getting out of breath sooner with activity than she used to. This has not gotten worse recently. Cathy Mitchell denies shortness of breath at rest or orthopnea.  3. Osteoarthritis of knee (Bilateral) (R>L) Vrinda needs to lose weight to get her BMI below 40. Her exercise is limited due to knee pain and damage.  Assessment/Plan:   1. Other fatigue Cathy Mitchell does feel that her weight is causing her energy to be lower than it should be. Fatigue may be related to obesity, depression or many other causes. Labs will be ordered, and in the meanwhile, Cathy Mitchell will focus on self care including making healthy food choices, increasing physical activity and focusing on stress reduction.  - Vitamin B12 - CBC with Differential/Platelet - Comprehensive metabolic panel - Folate - Hemoglobin A1c - Insulin, random - T3 - T4 - Lipid Panel With LDL/HDL Ratio - TSH - VITAMIN D 25 Hydroxy (Vit-D Deficiency, Fractures)  2. Shortness of breath on exertion Cathy Mitchell does feel that she gets out of breath more easily that she used to when she exercises. Cathy Mitchell's shortness of breath appears to be obesity related and exercise induced. She has agreed to work on weight loss and gradually increase exercise to treat her exercise induced shortness of breath. Will  continue to monitor closely.  3. Osteoarthritis of knee (Bilateral) (R>L) Cathy Mitchell will work on diet and exercise, and will follow up as directed.  4. Screening for depression Cathy Mitchell had a positive depression screening. Depression is commonly associated with obesity and often results in emotional eating behaviors. We will monitor this closely and work on CBT to help improve the non-hunger eating patterns.  Referral to Psychology may be required if no improvement is seen as she continues in our clinic.  5. Class 3 severe obesity with serious comorbidity and body mass index (BMI) of 40.0 to 44.9 in adult, unspecified obesity type ([place]) Cathy Mitchell is not currently in the action stage of change and her goal is to continue with weight loss efforts. I recommend Cathy Mitchell begin the structured treatment plan as follows:  Cathy Mitchell does not appear to be in the action stage of change. I will monitor her progress to see how we can help her best.  She has agreed to practicing portion control and making smarter food choices, such as increasing vegetables and decreasing simple carbohydrates.  Exercise goals: All adults should avoid inactivity. Some physical activity is better than none, and adults who participate in any amount of physical activity gain some health benefits.   Behavioral modification strategies: increasing lean protein intake and decreasing simple carbohydrates.  She was informed of the importance of frequent follow-up visits to maximize her success with intensive lifestyle modifications for her multiple health conditions. She was informed we would discuss her lab results at her next visit unless there is a critical issue that needs to be addressed sooner. Cathy Mitchell agreed to keep her next visit at the agreed upon time to discuss these results.  Objective:   Blood pressure (!) 144/76, pulse 61, temperature 98.3 F (36.8 C), weight 281 lb (127.5 kg), last menstrual period 02/09/2017, SpO2 98 %. Body mass index is 41.5 kg/m.  EKG: Normal sinus rhythm, rate 74 BPM.  Indirect Calorimeter completed today shows a VO2 of (unable to obtain) and a REE of (unable to obtain).  Her calculated basal metabolic rate is (unable to obtain) thus her basal metabolic rate is (unable to obtain) than expected.  General: Cooperative, alert, well developed, in no acute distress. HEENT: Conjunctivae and lids  unremarkable. Cardiovascular: Regular rhythm.  Lungs: Normal work of breathing. Neurologic: No focal deficits.   Lab Results  Component Value Date   CREATININE 0.6 03/05/2020   BUN 12 03/05/2020   NA 137 03/05/2020   K 4.4 03/05/2020   CL 98 (A) 03/05/2020   CO2 28 03/08/2018   Lab Results  Component Value Date   ALT 13 03/05/2020   AST 12 (A) 03/05/2020   ALKPHOS 107 03/05/2020   BILITOT 0.5 03/08/2018   Lab Results  Component Value Date   HGBA1C 5.7 10/15/2014   No results found for: INSULIN Lab Results  Component Value Date   TSH 0.91 03/05/2020   Lab Results  Component Value Date   CHOL 165 03/05/2020   HDL 58 03/05/2020   LDLCALC 87 03/05/2020   TRIG 113 03/05/2020   CHOLHDL 4.3 12/03/2014   Lab Results  Component Value Date   WBC 8.6 03/05/2020   HGB 13.4 03/05/2020   HCT 41 03/05/2020   MCV 88.6 03/08/2018   PLT 365 03/05/2020   Lab Results  Component Value Date   IRON 66 03/05/2020   Attestation Statements:   Reviewed by clinician on day of visit: allergies, medications, problem list, medical history, surgical history, family  history, social history, and previous encounter notes.  Time spent on visit including pre-visit chart review and post-visit charting and care was 45 minutes.    I, Trixie Dredge, am acting as transcriptionist for Dennard Nip, MD.  I have reviewed the above documentation for accuracy and completeness, and I agree with the above. - Dennard Nip, MD

## 2020-07-26 DIAGNOSIS — F331 Major depressive disorder, recurrent, moderate: Secondary | ICD-10-CM

## 2020-07-26 DIAGNOSIS — F418 Other specified anxiety disorders: Secondary | ICD-10-CM

## 2020-08-06 ENCOUNTER — Other Ambulatory Visit: Payer: Self-pay

## 2020-08-06 ENCOUNTER — Ambulatory Visit (INDEPENDENT_AMBULATORY_CARE_PROVIDER_SITE_OTHER): Payer: BC Managed Care – PPO | Admitting: Family Medicine

## 2020-08-06 ENCOUNTER — Encounter (INDEPENDENT_AMBULATORY_CARE_PROVIDER_SITE_OTHER): Payer: Self-pay | Admitting: Family Medicine

## 2020-08-06 VITALS — BP 122/79 | HR 84 | Temp 97.6°F | Ht 68.0 in | Wt 281.0 lb

## 2020-08-06 DIAGNOSIS — F418 Other specified anxiety disorders: Secondary | ICD-10-CM | POA: Diagnosis not present

## 2020-08-06 DIAGNOSIS — E559 Vitamin D deficiency, unspecified: Secondary | ICD-10-CM | POA: Diagnosis not present

## 2020-08-06 DIAGNOSIS — Z6841 Body Mass Index (BMI) 40.0 and over, adult: Secondary | ICD-10-CM | POA: Diagnosis not present

## 2020-08-06 MED ORDER — VITAMIN D (ERGOCALCIFEROL) 1.25 MG (50000 UNIT) PO CAPS: 50000.0000 [IU] | ORAL_CAPSULE | ORAL | 0 refills | Status: DC

## 2020-08-07 ENCOUNTER — Ambulatory Visit: Payer: BC Managed Care – PPO | Admitting: Nurse Practitioner

## 2020-08-07 ENCOUNTER — Other Ambulatory Visit: Payer: Self-pay

## 2020-08-07 ENCOUNTER — Encounter: Payer: Self-pay | Admitting: Nurse Practitioner

## 2020-08-07 VITALS — BP 128/78 | HR 84 | Temp 97.5°F | Wt 284.6 lb

## 2020-08-07 DIAGNOSIS — F331 Major depressive disorder, recurrent, moderate: Secondary | ICD-10-CM | POA: Diagnosis not present

## 2020-08-07 DIAGNOSIS — Z6841 Body Mass Index (BMI) 40.0 and over, adult: Secondary | ICD-10-CM | POA: Diagnosis not present

## 2020-08-07 DIAGNOSIS — F418 Other specified anxiety disorders: Secondary | ICD-10-CM | POA: Diagnosis not present

## 2020-08-07 MED ORDER — QUETIAPINE FUMARATE 25 MG PO TABS: ORAL_TABLET | ORAL | 4 refills | Status: DC

## 2020-08-07 NOTE — Assessment & Plan Note (Signed)
Refer to depression plan of care. 

## 2020-08-07 NOTE — Assessment & Plan Note (Signed)
Refer to obesity plan of care. 

## 2020-08-07 NOTE — Progress Notes (Signed)
BP 128/78   Pulse 84   Temp (!) 97.5 F (36.4 C) (Oral)   Wt 284 lb 9.6 oz (129.1 kg)   LMP 02/09/2017 (Within Weeks)   SpO2 97%   BMI 43.27 kg/m    Subjective:    Patient ID: Cathy Mitchell, female    DOB: 12-Mar-1966, 55 y.o.   MRN: [id number]  HPI: Cathy Mitchell is a 55 y.o. female  Chief Complaint  Patient presents with  . Anxiety    Patient states she is still not noticing any change.   . Medication Problem    Patient states she was seen by weight specialist and state she was told that she needed to increase her dose of Vitamin D3 from 50000 UT to 100000 UT. Patient states she wanted to take to provider before pick up the new prescription.    DEPRESSION/ANXIETY/STRESS Started on Wellbutrin 300 MG at this time at last visit + Buspar 10 MG daily -- these have not been working for her.  Has referral to [facility] for psychiatry locally -- reports they are not taking new patients.  Her husband is not supportive when it comes to her mood.  She states her mood feeds off how others are doing around her.  She is struggling with her son's illness -- mediterranean fever -- constantly gripped with pain when eating and she is working on monitoring diet to ensure he does not take in anything that he is food sensitive too -- she endorses this as difficult since he has multiple allergies.    Reports frustration over not being able to help him, but at same time feels guilty for resenting him.  Also reports frustration over not being able to get knee surgery -- due to inability to lose weight, and frustration at ortho providers.  Went to therapy via video therapy.  Reports frustration with weight and being unable to lose weight + people always telling her.  She did see weight management recently on 08/06/20 and was prescribed Vitamin D 100000 unit -- she has not started yet. Reports frustration with weight management and states they told her to see psychiatry -- they referred her to [facility] and advised her to go that day -- she did not attend.  Vitamin D currently 52.7.    Recent labs noted TSH 0.91. Duration:uncontrolled Anxious mood: yes  Excessive worrying: yes Irritability: yes  Sweating: no Nausea: no Palpitations:no Hyperventilation: no Panic attacks: no Agoraphobia: no  Obscessions/compulsions: no Depressed mood: yes Depression screen [facility] 2/9 08/07/2020 07/23/2020 07/10/2020 06/11/2020 04/05/2020  Decreased Interest 3 2 3 1 3   Down, Depressed, Hopeless 3 3 3 3 3   PHQ - 2 Score 6 5 6 4 6   Altered sleeping 3 3 3  0 3  Tired, decreased energy 3 3 3 1 3   Change in appetite 3 3 3 2 3   Feeling bad or failure about yourself  0 3 3 3 3   Trouble concentrating 3 3 3 3 3   Moving slowly or fidgety/restless 3 3 0 0 2  Suicidal thoughts 0 2 0 0 0  PHQ-9 Score 21 25 21 13 23   Difficult doing work/chores - Very difficult - - Very difficult   Anhedonia: no Weight changes: no Insomnia: yes hard to stay asleep  Hypersomnia: no Fatigue/loss of energy: yes Feelings of worthlessness: yes Feelings of guilt: yes Impaired concentration/indecisiveness: yes Suicidal ideations: no  Crying spells: yes Recent Stressors/Life Changes: yes  Relationship problems: no   Family stress: yes     Financial stress: no    Job stress: no    Recent death/loss: no GAD 7 : Generalized Anxiety Score 08/07/2020 07/10/2020  Nervous, Anxious, on Edge 3 3  Control/stop worrying 3 3  Worry too much - different things 2 2  Trouble relaxing 2 2  Restless 1 1  Easily annoyed or irritable 2 2  Afraid - awful might happen 2 2  Total GAD 7 Score 15 15  Anxiety Difficulty Somewhat difficult Very difficult   Relevant past medical, surgical, family and social history reviewed and updated as indicated. Interim medical history since our last visit reviewed. Allergies and medications reviewed and updated.  Review of Systems  Constitutional: Negative for activity change,  appetite change, diaphoresis, fatigue and unexpected weight change.  Respiratory: Negative.   Cardiovascular: Negative.   Gastrointestinal: Negative.   Neurological: Negative.   Psychiatric/Behavioral: Positive for decreased concentration and sleep disturbance. Negative for self-injury and suicidal ideas. The patient is nervous/anxious.     Per HPI unless specifically indicated above     Objective:    BP 128/78   Pulse 84   Temp (!) 97.5 F (36.4 C) (Oral)   Wt 284 lb 9.6 oz (129.1 kg)   LMP 02/09/2017 (Within Weeks)   SpO2 97%   BMI 43.27 kg/m   Wt Readings from Last 3 Encounters:  08/07/20 284 lb 9.6 oz (129.1 kg)  08/06/20 281 lb (127.5 kg)  07/23/20 281 lb (127.5 kg)    Physical Exam Vitals and nursing note reviewed.  Constitutional:      General: She is awake. She is not in acute distress.    Appearance: She is well-developed and well-groomed. She is morbidly obese. She is not ill-appearing.  HENT:     Head: Normocephalic.     Right Ear: Hearing normal.     Left Ear: Hearing normal.  Eyes:     General: Lids are normal.        Right eye: No discharge.        Left eye: No discharge.     Conjunctiva/sclera: Conjunctivae normal.     Pupils: Pupils are equal, round, and reactive to light.  Neck:     Thyroid: No thyromegaly.     Vascular: No carotid bruit or JVD.  Cardiovascular:     Rate and Rhythm: Normal rate and regular rhythm.     Heart sounds: Normal heart sounds. No murmur heard. No gallop.   Pulmonary:     Effort: Pulmonary effort is normal. No accessory muscle usage or respiratory distress.     Breath sounds: Normal breath sounds.  Abdominal:     General: Bowel sounds are normal.     Palpations: Abdomen is soft.  Musculoskeletal:     Cervical back: Normal range of motion and neck supple.     Right lower leg: No edema.     Left lower leg: No edema.  Lymphadenopathy:     Cervical: No cervical adenopathy.  Skin:    General: Skin is warm and dry.   Neurological:     Mental Status: She is alert and oriented to person, place, and time.  Psychiatric:        Attention and Perception: Attention normal.        Mood and Affect: Mood is anxious.        Speech: Speech is rapid and pressured.        Behavior: Behavior normal. Behavior  is cooperative.        Thought Content: Thought content normal. Thought content does not include suicidal plan.    Results for orders placed or performed in visit on 03/07/20  Iron, TIBC and Ferritin Panel  Result Value Ref Range   Iron 66   CBC and differential  Result Value Ref Range   Hemoglobin 13.4 12.0 - 16.0   HCT 41 36 - 46   Platelets 365 150 - 399   WBC 8.6   CBC  Result Value Ref Range   RBC 4.93 3.87 - 5.11  VITAMIN D 25 Hydroxy (Vit-D Deficiency, Fractures)  Result Value Ref Range   Vit D, 25-Hydroxy 65.7   Basic metabolic panel  Result Value Ref Range   Glucose 81    BUN 12 4 - 21   Creatinine 0.6 0.5 - 1.1   Potassium 4.4 3.4 - 5.3   Sodium 137 137 - 147   Chloride 98 (A) 99 - 108  Comprehensive metabolic panel  Result Value Ref Range   GFR calc Af Amer 119    GFR calc non Af Amer 103    Calcium 9.3 8.7 - 10.7  Lipid panel  Result Value Ref Range   Triglycerides 113 40 - 160   Cholesterol 165 0 - 200   HDL 58 35 - 70   LDL Cholesterol 87   Hepatic function panel  Result Value Ref Range   Alkaline Phosphatase 107 25 - 125   ALT 13 7 - 35   AST 12 (A) 13 - 35   Bilirubin, Total 0.2   TSH  Result Value Ref Range   TSH 0.91 0.41 - 5.90      Assessment & Plan:   Problem List Items Addressed This Visit      Other   Obesity    Ongoing and has major frustrations with inability to lose weight + effect on ability to obtain knee surgery.  She stopped Victoza as made her body feel bad.  Continue to collaborate with weight management and recommend she continue Vitamin D supplement.      BMI 40.0-44.9, adult [facility])    Refer to obesity plan of care.      Depression -  Primary    Ongoing with anxiety, has had no benefit with Wellbutrin (which was started for mood and her concern weight gain with other medications) and Buspar. ?other element present -- such as Bipolar due to heightened and lower periods.  Will benefit psychiatry evaluation to further guide treatment, reached out to referral coordinator to further assist on recent referral and she will look into [place] or [place] locations.  Discussed with patient.  Will trial Seroquel 50 MG QHS -- this may offer benefit to mood and sleep pattern.  Recommend she start taking every night and continue therapy sessions, as would be beneficial.  She reports history of chronic PTSD diagnosis in past.  Denies SI/HI at this time.  Return in 4 weeks for follow-up, sooner if worsening mood.      Situational anxiety    Refer to depression plan of care.         Time: 35 minutes, >50% spent counseling and therapeutic listening   Follow up plan: Return in about 4 weeks (around 09/04/2020) for MOOD.

## 2020-08-07 NOTE — Assessment & Plan Note (Signed)
Ongoing with anxiety, has had no benefit with Wellbutrin (which was started for mood and her concern weight gain with other medications) and Buspar. ?other element present -- such as Bipolar due to heightened and lower periods.  Will benefit psychiatry evaluation to further guide treatment, reached out to referral coordinator to further assist on recent referral and she will look into [place] or [place] locations.  Discussed with patient.  Will trial Seroquel 50 MG QHS -- this may offer benefit to mood and sleep pattern.  Recommend she start taking every night and continue therapy sessions, as would be beneficial.  She reports history of chronic PTSD diagnosis in past.  Denies SI/HI at this time.  Return in 4 weeks for follow-up, sooner if worsening mood.
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
## 2020-08-07 NOTE — Patient Instructions (Signed)
[email or web address].[email or web address]">  Generalized Anxiety Disorder, Adult Generalized anxiety disorder (GAD) is a mental health condition. Unlike normal worries, anxiety related to GAD is not triggered by a specific event. These worries do not fade or get better with time. GAD interferes with relationships, work, and school. GAD symptoms can vary from mild to severe. People with severe GAD can have intense waves of anxiety with physical symptoms that are similar to panic attacks. What are the causes? The exact cause of GAD is not known, but the following are believed to have an impact:  Differences in natural brain chemicals.  Genes passed down from parents to children.  Differences in the way threats are perceived.  Development during childhood.  Personality. What increases the risk? The following factors may make you more likely to develop this condition:  Being female.  Having a family history of anxiety disorders.  Being very shy.  Experiencing very stressful life events, such as the death of a loved one.  Having a very stressful family environment. What are the signs or symptoms? People with GAD often worry excessively about many things in their lives, such as their health and family. Symptoms may also include:  Mental and emotional symptoms: ? Worrying excessively about natural disasters. ? Fear of being late. ? Difficulty concentrating. ? Fears that others are judging your performance.  Physical symptoms: ? Fatigue. ? Headaches, muscle tension, muscle twitches, trembling, or feeling shaky. ? Feeling like your heart is pounding or beating very fast. ? Feeling out of breath or like you cannot take a deep breath. ? Having trouble falling asleep or staying asleep, or experiencing restlessness. ? Sweating. ? Nausea, diarrhea, or irritable bowel syndrome (IBS).  Behavioral symptoms: ? Experiencing erratic moods or irritability. ? Avoidance of new situations. ? Avoidance of  people. ? Extreme difficulty making decisions. How is this diagnosed? This condition is diagnosed based on your symptoms and medical history. You will also have a physical exam. Your health care provider may perform tests to rule out other possible causes of your symptoms. To be diagnosed with GAD, a person must have anxiety that:  Is out of his or her control.  Affects several different aspects of his or her life, such as work and relationships.  Causes distress that makes him or her unable to take part in normal activities.  Includes at least three symptoms of GAD, such as restlessness, fatigue, trouble concentrating, irritability, muscle tension, or sleep problems. Before your health care provider can confirm a diagnosis of GAD, these symptoms must be present more days than they are not, and they must last for 6 months or longer. How is this treated? This condition may be treated with:  Medicine. Antidepressant medicine is usually prescribed for long-term daily control. Anti-anxiety medicines may be added in severe cases, especially when panic attacks occur.  Talk therapy (psychotherapy). Certain types of talk therapy can be helpful in treating GAD by providing support, education, and guidance. Options include: ? Cognitive behavioral therapy (CBT). People learn coping skills and self-calming techniques to ease their physical symptoms. They learn to identify unrealistic thoughts and behaviors and to replace them with more appropriate thoughts and behaviors. ? Acceptance and commitment therapy (ACT). This treatment teaches people how to be mindful as a way to cope with unwanted thoughts and feelings. ? Biofeedback. This process trains you to manage your body's response (physiological response) through breathing techniques and relaxation methods. You will work with a therapist while machines are used to monitor your physical   symptoms.  Stress management techniques. These include yoga,  meditation, and exercise. A mental health specialist can help determine which treatment is best for you. Some people see improvement with one type of therapy. However, other people require a combination of therapies.   Follow these instructions at home: Lifestyle  Maintain a consistent routine and schedule.  Anticipate stressful situations. Create a plan, and allow extra time to work with your plan.  Practice stress management or self-calming techniques that you have learned from your therapist or your health care provider. General instructions  Take over-the-counter and prescription medicines only as told by your health care provider.  Understand that you are likely to have setbacks. Accept this and be kind to yourself as you persist to take better care of yourself.  Recognize and accept your accomplishments, even if you judge them as small.  Keep all follow-up visits as told by your health care provider. This is important. Contact a health care provider if:  Your symptoms do not get better.  Your symptoms get worse.  You have signs of depression, such as: ? A persistently sad or irritable mood. ? Loss of enjoyment in activities that used to bring you joy. ? Change in weight or eating. ? Changes in sleeping habits. ? Avoiding friends or family members. ? Loss of energy for normal tasks. ? Feelings of guilt or worthlessness. Get help right away if:  You have serious thoughts about hurting yourself or others. If you ever feel like you may hurt yourself or others, or have thoughts about taking your own life, get help right away. Go to your nearest emergency department or:  Call your local emergency services (911 in the [place]  Call a suicide crisis helpline, such as the National Suicide Prevention Lifeline at 1-800-273-8255. This is open 24 hours a day in the [place]  Text the Crisis Text Line at 741741 (in the [place] Summary  Generalized anxiety disorder (GAD) is a mental  health condition that involves worry that is not triggered by a specific event.  People with GAD often worry excessively about many things in their lives, such as their health and family.  GAD may cause symptoms such as restlessness, trouble concentrating, sleep problems, frequent sweating, nausea, diarrhea, headaches, and trembling or muscle twitching.  A mental health specialist can help determine which treatment is best for you. Some people see improvement with one type of therapy. However, other people require a combination of therapies. This information is not intended to replace advice given to you by your health care provider. Make sure you discuss any questions you have with your health care provider. Document Revised: 01/04/2019 Document Reviewed: 01/04/2019 Elsevier Patient Education  2021 [employer].  

## 2020-08-07 NOTE — Assessment & Plan Note (Signed)
Ongoing and has major frustrations with inability to lose weight + effect on ability to obtain knee surgery.  She stopped Victoza as made her body feel bad.  Continue to collaborate with weight management and recommend she continue Vitamin D supplement.

## 2020-08-14 ENCOUNTER — Encounter (INDEPENDENT_AMBULATORY_CARE_PROVIDER_SITE_OTHER): Payer: Self-pay | Admitting: Family Medicine

## 2020-08-29 ENCOUNTER — Other Ambulatory Visit (INDEPENDENT_AMBULATORY_CARE_PROVIDER_SITE_OTHER): Payer: Self-pay | Admitting: Family Medicine

## 2020-08-29 DIAGNOSIS — E559 Vitamin D deficiency, unspecified: Secondary | ICD-10-CM

## 2020-09-02 ENCOUNTER — Other Ambulatory Visit: Payer: Self-pay | Admitting: Nurse Practitioner

## 2020-09-10 ENCOUNTER — Encounter: Payer: Self-pay | Admitting: Nurse Practitioner

## 2020-09-10 ENCOUNTER — Other Ambulatory Visit: Payer: Self-pay

## 2020-09-10 ENCOUNTER — Ambulatory Visit: Payer: BC Managed Care – PPO | Admitting: Nurse Practitioner

## 2020-09-10 VITALS — BP 124/80 | HR 73 | Temp 98.0°F | Wt 287.2 lb

## 2020-09-10 DIAGNOSIS — Z6841 Body Mass Index (BMI) 40.0 and over, adult: Secondary | ICD-10-CM

## 2020-09-10 DIAGNOSIS — F331 Major depressive disorder, recurrent, moderate: Secondary | ICD-10-CM | POA: Diagnosis not present

## 2020-09-10 DIAGNOSIS — F418 Other specified anxiety disorders: Secondary | ICD-10-CM

## 2020-09-10 MED ORDER — OZEMPIC (0.25 OR 0.5 MG/DOSE) 2 MG/1.5ML ~~LOC~~ SOPN: PEN_INJECTOR | SUBCUTANEOUS | 3 refills | Status: DC

## 2020-09-10 NOTE — Assessment & Plan Note (Signed)
Refer to depression plan of care. 

## 2020-09-10 NOTE — Patient Instructions (Signed)
Obesity, Adult Obesity is having too much body fat. Being obese means that your weight is morethan what is healthy for you. BMI is a number that explains how much body fat you have. If you have a BMI of 30 or more, you are obese. Obesity is often caused by eating or drinking morecalories than your body uses. Changing your lifestyle can help you lose weight. Obesity can cause serious health problems, such as: Stroke. Coronary artery disease (CAD). Type 2 diabetes. Some types of cancer, including cancers of the colon, breast, uterus, and gallbladder. Osteoarthritis. High blood pressure (hypertension). High cholesterol. Sleep apnea. Gallbladder stones. Infertility problems. What are the causes? Eating meals each day that are high in calories, sugar, and fat. Being born with genes that may make you more likely to become obese. Having a medical condition that causes obesity. Taking certain medicines. Sitting a lot (having a sedentary lifestyle). Not getting enough sleep. Drinking a lot of drinks that have sugar in them. What increases the risk? Having a family history of obesity. Being an [place] American woman. Being a Hispanic man. Living in an area with limited access to: [place], recreation centers, or sidewalks. Healthy food choices, such as grocery stores and farmers' markets. What are the signs or symptoms? The main sign is having too much body fat. How is this treated? Treatment for this condition often includes changing your lifestyle. Treatment may include: Changing your diet. This may include making a healthy meal plan. Exercise. This may include activity that causes your heart to beat faster (aerobic exercise) and strength training. Work with your doctor to design a program that works for you. Medicine to help you lose weight. This may be used if you are not able to lose 1 pound a week after 6 weeks of healthy eating and more exercise. Treating conditions that cause the  obesity. Surgery. Options may include gastric banding and gastric bypass. This may be done if: Other treatments have not helped to improve your condition. You have a BMI of 40 or higher. You have life-threatening health problems related to obesity. Follow these instructions at home: Eating and drinking  Follow advice from your doctor about what to eat and drink. Your doctor may tell you to: Limit fast food, sweets, and processed snack foods. Choose low-fat options. For example, choose low-fat milk instead of whole milk. Eat 5 or more servings of fruits or vegetables each day. Eat at home more often. This gives you more control over what you eat. Choose healthy foods when you eat out. Learn to read food labels. This will help you learn how much food is in 1 serving. Keep low-fat snacks available. Avoid drinks that have a lot of sugar in them. These include soda, fruit juice, iced tea with sugar, and flavored milk. Drink enough water to keep your pee (urine) pale yellow. Do not go on fad diets.  Physical activity Exercise often, as told by your doctor. Most adults should get up to 150 minutes of moderate-intensity exercise every week.Ask your doctor: What types of exercise are safe for you. How often you should exercise. Warm up and stretch before being active. Do slow stretching after being active (cool down). Rest between times of being active. Lifestyle Work with your doctor and a food expert (dietitian) to set a weight-loss goal that is best for you. Limit your screen time. Find ways to reward yourself that do not involve food. Do not drink alcohol if: Your doctor tells you not to drink.  You are pregnant, may be pregnant, or are planning to become pregnant. If you drink alcohol: Limit how much you use to: 0-1 drink a day for women. 0-2 drinks a day for men. Be aware of how much alcohol is in your drink. In the [place] one drink equals one 12 oz bottle of beer (355 mL), one 5 oz  glass of wine (148 mL), or one 1 oz glass of hard liquor (44 mL). General instructions Keep a weight-loss journal. This can help you keep track of: The food that you eat. How much exercise you get. Take over-the-counter and prescription medicines only as told by your doctor. Take vitamins and supplements only as told by your doctor. Think about joining a support group. Keep all follow-up visits as told by your doctor. This is important. Contact a doctor if: You cannot meet your weight loss goal after you have changed your diet and lifestyle for 6 weeks. Get help right away if you: Are having trouble breathing. Are having thoughts of harming yourself. Summary Obesity is having too much body fat. Being obese means that your weight is more than what is healthy for you. Work with your doctor to set a weight-loss goal. Get regular exercise as told by your doctor. This information is not intended to replace advice given to you by your health care provider. Make sure you discuss any questions you have with your healthcare provider. Document Revised: 11/18/2017 Document Reviewed: 11/18/2017 Elsevier Patient Education  2022 [employer].

## 2020-09-10 NOTE — Progress Notes (Signed)
BP 124/80   Pulse 73   Temp 98 F (36.7 C) (Oral)   Wt 287 lb 3.2 oz (130.3 kg)   LMP 02/09/2017 (Within Weeks)   SpO2 98%   BMI 43.67 kg/m    Subjective:    Patient ID: Cathy Mitchell, female    DOB: November 14, 1965, 55 y.o.   MRN: [id number]  HPI: Cathy Mitchell is a 55 y.o. female  Chief Complaint  Patient presents with   Mood    Patient is here to follow up on Mood. Patient states her mood has been about the same.    DEPRESSION/ANXIETY/STRESS Tried Wellbutrin 300 MG + Buspar 10 MG daily weeks back  -- these have not been working for her and she stopped.  Tried psychiatry referrals, but she reports [facility] not taking new patients.  She reports she has sent her paperwork to psychiatry in [place], but appointment is scheduled months out.  She reports her anxiety is not as crippling as it was before. Added Seroquel last visit which she reports some benefit from.  Her husband is not supportive when it comes to her mood.  She states her mood feeds off how others are doing around her.  She is struggling with her son's illness -- mediterranean fever -- constantly gripped with pain when eating and she is working on monitoring diet to ensure he does not take in anything that he is food sensitive too -- she endorses this as difficult since he has multiple allergies.    Reports frustration over not being able to help him, but at same time feels guilty for resenting him.  Also reports frustration over not being able to get knee surgery -- due to inability to lose weight, and frustration at ortho providers -- she does not understand why some people her weight can get surgery.  Went to therapy via video therapy.  Reports frustration with weight and being unable to lose weight + people always telling her.  She feels due to her knees in the next year she will be in a wheelchair.  She reports grieving over this loss -- and will NEVER focus on knee replacement again -- states once she says never she  means never and her previous PCP understood this about her.    Reports is always hungry and she does not understand why she has to eat all the time.  Feels she is in a no win situation.  She will not write things down and reports the weight loss people tell her to write things down.  She feels weight loss clinics are "pyramid skins" and she states she can not lose weight so it is off the table.  Reports he fibromyalgia fog gets in the way often.  She has tried [place] in past without much benefit.  Is interested in trying Ozempic after discussion with PCP today.   She did see weight management on 08/06/20 and was prescribed Vitamin D 100000 unit -- she has not started yet. Vitamin D currently 52.7.    Recent labs noted TSH 0.91. Duration: stable Anxious mood: yes  Excessive worrying: yes Irritability: yes  Sweating: no Nausea: no Palpitations:no Hyperventilation: no Panic attacks: no Agoraphobia: no  Obscessions/compulsions: no Depressed mood: yes Depression screen [facility] 2/9 09/10/2020 08/07/2020 07/23/2020 07/10/2020 06/11/2020  Decreased Interest 2 3 2 3 1   Down, Depressed, Hopeless 2 3 3 3 3   PHQ - 2 Score 4 6 5 6 4   Altered sleeping 2 3 3  3  0  Tired, decreased energy 2 3 3 3 1   Change in appetite 3 3 3 3 2   Feeling bad or failure about yourself  2 0 3 3 3   Trouble concentrating 1 3 3 3 3   Moving slowly or fidgety/restless 0 3 3 0 0  Suicidal thoughts 0 0 2 0 0  PHQ-9 Score 14 21 25 21 13   Difficult doing work/chores Somewhat difficult - Very difficult - -   Anhedonia: no Weight changes: no Insomnia: yes hard to stay asleep  Hypersomnia: no Fatigue/loss of energy: yes Feelings of worthlessness: yes Feelings of guilt: yes Impaired concentration/indecisiveness: yes Suicidal ideations: no  Crying spells: yes Recent Stressors/Life Changes: yes   Relationship problems: no   Family stress: yes     Financial stress: no    Job stress: no    Recent death/loss: no GAD 7 :  Generalized Anxiety Score 09/10/2020 08/07/2020 07/10/2020  Nervous, Anxious, on Edge 2 3 3   Control/stop worrying 3 3 3   Worry too much - different things 3 2 2   Trouble relaxing 2 2 2   Restless 1 1 1   Easily annoyed or irritable 2 2 2   Afraid - awful might happen 1 2 2   Total GAD 7 Score 14 15 15   Anxiety Difficulty Somewhat difficult Somewhat difficult Very difficult   Relevant past medical, surgical, family and social history reviewed and updated as indicated. Interim medical history since our last visit reviewed. Allergies and medications reviewed and updated.  Review of Systems  Constitutional:  Negative for activity change, appetite change, diaphoresis, fatigue and unexpected weight change.  Respiratory: Negative.    Cardiovascular: Negative.   Gastrointestinal: Negative.   Neurological: Negative.   Psychiatric/Behavioral:  Positive for decreased concentration and sleep disturbance. Negative for self-injury and suicidal ideas. The patient is nervous/anxious.    Per HPI unless specifically indicated above     Objective:    BP 124/80   Pulse 73   Temp 98 F (36.7 C) (Oral)   Wt 287 lb 3.2 oz (130.3 kg)   LMP 02/09/2017 (Within Weeks)   SpO2 98%   BMI 43.67 kg/m   Wt Readings from Last 3 Encounters:  09/10/20 287 lb 3.2 oz (130.3 kg)  08/07/20 284 lb 9.6 oz (129.1 kg)  08/06/20 281 lb (127.5 kg)    Physical Exam Vitals and nursing note reviewed.  Constitutional:      General: She is awake. She is not in acute distress.    Appearance: She is well-developed and well-groomed. She is morbidly obese. She is not ill-appearing.  HENT:     Head: Normocephalic.     Right Ear: Hearing normal.     Left Ear: Hearing normal.  Eyes:     General: Lids are normal.        Right eye: No discharge.        Left eye: No discharge.     Conjunctiva/sclera: Conjunctivae normal.     Pupils: Pupils are equal, round, and reactive to light.  Neck:     Thyroid: No thyromegaly.      Vascular: No carotid bruit or JVD.  Cardiovascular:     Rate and Rhythm: Normal rate and regular rhythm.     Heart sounds: Normal heart sounds. No murmur heard.   No gallop.  Pulmonary:     Effort: Pulmonary effort is normal. No accessory muscle usage or respiratory distress.     Breath sounds: Normal breath sounds.  Abdominal:  General: Bowel sounds are normal.     Palpations: Abdomen is soft.  Musculoskeletal:     Cervical back: Normal range of motion and neck supple.     Right lower leg: No edema.     Left lower leg: No edema.  Lymphadenopathy:     Cervical: No cervical adenopathy.  Skin:    General: Skin is warm and dry.  Neurological:     Mental Status: She is alert and oriented to person, place, and time.  Psychiatric:        Attention and Perception: Attention normal.        Mood and Affect: Mood is anxious.        Speech: Speech is rapid and pressured.        Behavior: Behavior normal. Behavior is cooperative.        Thought Content: Thought content normal. Thought content does not include suicidal plan.   Results for orders placed or performed in visit on 08/14/20  CBC and differential  Result Value Ref Range   Hemoglobin 13.1 12.0 - 16.0   HCT 41 36 - 46   Platelets 373 150 - 399   WBC 7.1   CBC  Result Value Ref Range   RBC 4.75 3.87 - 5.11  VITAMIN D 25 Hydroxy (Vit-D Deficiency, Fractures)  Result Value Ref Range   Vit D, 25-Hydroxy 29.9   Basic metabolic panel  Result Value Ref Range   Glucose 92    BUN 7 4 - 21   CO2 25 (A) 13 - 22   Creatinine 0.8 0.5 - 1.1   Potassium 4.2 3.4 - 5.3   Sodium 138 137 - 147   Chloride 99 99 - 108  Lipid panel  Result Value Ref Range   LDl/HDL Ratio 1.3    Triglycerides 113 40 - 160   HDL 56 35 - 70   LDL Cholesterol 74   Hemoglobin A1c  Result Value Ref Range   Hemoglobin A1C 5.5       Assessment & Plan:   Problem List Items Addressed This Visit       Other   Obesity    BMI 43.67.  Ongoing and has  major frustrations with inability to lose weight + effect on ability to obtain knee surgery.  She stopped Victoza as made her body feel bad.  Will trial Ozempic -- sent script in and discussed at length with her.  Suspect if we can get psych med stabilized then may offer benefit to weight management.  Return in 6 weeks.       Relevant Medications   Semaglutide,0.25 or 0.5MG /DOS, (OZEMPIC, 0.25 OR 0.5 MG/DOSE,) 2 MG/1.5ML SOPN   Depression - Primary    Ongoing with anxiety, has had no benefit with Wellbutrin (which was started for mood and her concern weight gain with other medications) and Buspar. ?other element present -- such as Bipolar due to manic and lower periods.  Will benefit psychiatry evaluation to further guide treatment, which she is working on.  Discussed with patient.  Will continue Seroquel 50 MG QHS at this time -- this is offering some benefit.  Recommend she start taking every night and continue therapy sessions, as would be beneficial.  She reports history of chronic PTSD diagnosis in past.  Denies SI/HI at this time.  Return in 6 weeks for follow-up, sooner if worsening mood.       Situational anxiety    Refer to depression plan of care.  Time: 35 minutes, >50% spent counseling and therapeutic listening   Follow up plan: Return in about 6 weeks (around 10/22/2020) for WEIGHT CHECK.

## 2020-09-10 NOTE — Assessment & Plan Note (Signed)
Ongoing with anxiety, has had no benefit with Wellbutrin (which was started for mood and her concern weight gain with other medications) and Buspar. ?other element present -- such as Bipolar due to manic and lower periods.  Will benefit psychiatry evaluation to further guide treatment, which she is working on.  Discussed with patient.  Will continue Seroquel 50 MG QHS at this time -- this is offering some benefit.  Recommend she start taking every night and continue therapy sessions, as would be beneficial.  She reports history of chronic PTSD diagnosis in past.  Denies SI/HI at this time.  Return in 6 weeks for follow-up, sooner if worsening mood.

## 2020-09-10 NOTE — Assessment & Plan Note (Addendum)
BMI 43.67.  Ongoing and has major frustrations with inability to lose weight + effect on ability to obtain knee surgery.  She stopped Victoza as made her body feel bad.  Will trial Ozempic -- sent script in and discussed at length with her.  Suspect if we can get psych med stabilized then may offer benefit to weight management.  Return in 6 weeks.

## 2020-09-28 ENCOUNTER — Other Ambulatory Visit: Payer: Self-pay | Admitting: Nurse Practitioner

## 2020-09-28 NOTE — Telephone Encounter (Signed)
Requested medication (s) are due for refill today: no  Requested medication (s) are on the active medication list: yes  Last refill:  08/07/20 #120 4 RF  Future visit scheduled: yes  Notes to clinic:  med not delegated to NT to RF/// New request for Disp: 180 tablets with 4 RF///NT not delegated to refuse or refill this med   Requested Prescriptions  Pending Prescriptions Disp Refills   QUEtiapine (SEROQUEL) 25 MG tablet [Pharmacy Med Name: QUETIAPINE FUMARATE 25 MG TAB] 180 tablet 4    Sig: Start out with one tablet (25 MG) by mouth at night prior to bed for one week and if tolerating then increase to two tablets (50 MG) by mouth at night.      Not Delegated - Psychiatry:  Antipsychotics - Second Generation (Atypical) - quetiapine Failed - 09/28/2020  2:31 PM      Failed - This refill cannot be delegated      Failed - ALT in normal range and within 180 days    ALT  Date Value Ref Range Status  03/05/2020 13 7 - 35 Final   SGPT (ALT)  Date Value Ref Range Status  06/19/2012 42 12 - 78 U/L Final          Failed - AST in normal range and within 180 days    AST  Date Value Ref Range Status  03/05/2020 12 (A) 13 - 35 Final   SGOT(AST)  Date Value Ref Range Status  06/19/2012 27 15 - 37 Unit/L Final          Passed - Completed PHQ-2 or PHQ-9 in the last 360 days      Passed - Last BP in normal range    BP Readings from Last 1 Encounters:  09/10/20 124/80          Passed - Valid encounter within last 6 months    Recent Outpatient Visits           2 weeks ago Moderate episode of recurrent major depressive disorder ([place])   [place], Jolene T, NP   1 month ago Moderate episode of recurrent major depressive disorder ([place])   [place], Jolene T, NP   2 months ago Moderate episode of recurrent major depressive disorder ([place])   [place], Jolene T, NP   3 months ago Moderate episode of recurrent major  depressive disorder ([place])   [place], Jolene T, NP   5 months ago BMI 40.0-44.9, adult ([place])   [place], Barbaraann Faster, NP       Future Appointments             In 3 weeks [place], Barbaraann Faster, NP [employer], [facility]   In 2 months Ar[place], Mertie Clause, MD [place], [id number]

## 2020-10-01 NOTE — Telephone Encounter (Signed)
Pt is scheduled 7/27

## 2020-10-08 DIAGNOSIS — F5105 Insomnia due to other mental disorder: Secondary | ICD-10-CM | POA: Diagnosis not present

## 2020-10-08 DIAGNOSIS — F411 Generalized anxiety disorder: Secondary | ICD-10-CM | POA: Diagnosis not present

## 2020-10-08 DIAGNOSIS — F331 Major depressive disorder, recurrent, moderate: Secondary | ICD-10-CM | POA: Diagnosis not present

## 2020-10-08 DIAGNOSIS — F4312 Post-traumatic stress disorder, chronic: Secondary | ICD-10-CM | POA: Diagnosis not present

## 2020-10-09 DIAGNOSIS — E538 Deficiency of other specified B group vitamins: Secondary | ICD-10-CM

## 2020-10-09 MED ORDER — CYANOCOBALAMIN 1000 MCG/ML IJ SOLN
1000.0000 ug | INTRAMUSCULAR | Status: DC
  Administered 2020-10-23 – 2021-10-23 (×12): 1000 ug via INTRAMUSCULAR

## 2020-10-11 DIAGNOSIS — Z0189 Encounter for other specified special examinations: Secondary | ICD-10-CM | POA: Diagnosis not present

## 2020-10-15 ENCOUNTER — Other Ambulatory Visit: Payer: Self-pay | Admitting: Nurse Practitioner

## 2020-10-15 MED ORDER — INSULIN PEN NEEDLE 30G X 8 MM MISC: 3 refills | Status: DC

## 2020-10-21 ENCOUNTER — Other Ambulatory Visit: Payer: Self-pay | Admitting: Nurse Practitioner

## 2020-10-21 MED ORDER — OZEMPIC (0.25 OR 0.5 MG/DOSE) 2 MG/1.5ML ~~LOC~~ SOPN: PEN_INJECTOR | SUBCUTANEOUS | 3 refills | Status: DC

## 2020-10-23 ENCOUNTER — Other Ambulatory Visit: Payer: Self-pay

## 2020-10-23 ENCOUNTER — Ambulatory Visit (INDEPENDENT_AMBULATORY_CARE_PROVIDER_SITE_OTHER): Payer: BC Managed Care – PPO | Admitting: Nurse Practitioner

## 2020-10-23 ENCOUNTER — Encounter: Payer: Self-pay | Admitting: Nurse Practitioner

## 2020-10-23 VITALS — BP 119/79 | HR 84 | Wt 284.0 lb

## 2020-10-23 DIAGNOSIS — G259 Extrapyramidal and movement disorder, unspecified: Secondary | ICD-10-CM | POA: Insufficient documentation

## 2020-10-23 DIAGNOSIS — R251 Tremor, unspecified: Secondary | ICD-10-CM

## 2020-10-23 DIAGNOSIS — Z6841 Body Mass Index (BMI) 40.0 and over, adult: Secondary | ICD-10-CM | POA: Diagnosis not present

## 2020-10-23 DIAGNOSIS — E538 Deficiency of other specified B group vitamins: Secondary | ICD-10-CM | POA: Diagnosis not present

## 2020-10-23 DIAGNOSIS — F331 Major depressive disorder, recurrent, moderate: Secondary | ICD-10-CM | POA: Diagnosis not present

## 2020-10-23 DIAGNOSIS — F418 Other specified anxiety disorders: Secondary | ICD-10-CM

## 2020-10-23 MED ORDER — INSULIN PEN NEEDLE 30G X 8 MM MISC: 4 refills | Status: DC

## 2020-10-23 NOTE — Assessment & Plan Note (Signed)
Chronic, ongoing, currently being followed by psychiatry and therapy.  Denies SI/HI.  Continue current medication regimen as prescribed by them.  Discussed at length with her today.  She is to report all side effect concerns to them.  Return in 8 weeks.

## 2020-10-23 NOTE — Assessment & Plan Note (Signed)
Chronic, ongoing, currently being followed by psychiatry and therapy.  Denies SI/HI.  Continue current medication regimen as prescribed by them.  Discussed at length with her today.  Return in 8 weeks.

## 2020-10-23 NOTE — Assessment & Plan Note (Signed)
Referral to neurology placed for further assessment.  Suspect more essential vs Parkinson's related.  Possibly worse with Seroquel, which is now discontinued.  Return in 8 weeks.

## 2020-10-23 NOTE — Assessment & Plan Note (Addendum)
Has lost 4 pounds, praised for this. BMI 43.18. Will continue Ozempic -- sent refills in and discussed at length with her.  Suspect if we can get psych med stabilized then may offer benefit to weight management.  Return in 8 weeks.

## 2020-10-23 NOTE — Progress Notes (Signed)
BP 119/79   Pulse 84   Wt 284 lb (128.8 kg)   LMP 02/09/2017 (Within Weeks)   SpO2 97%   BMI 43.18 kg/m    Subjective:    Patient ID: Cathy Mitchell, female    DOB: 03/08/1966, 55 y.o.   MRN: [id number]  HPI: Cathy Mitchell is a 55 y.o. female  Chief Complaint  Patient presents with   Weight Check   Follow-up    Fiber Fog has gotten worse and states she is struggling with that. Patient states her doctor would like her blood pressure reading checked daily as her bottom number was spiked and it scared her. Patient would like to discuss with provider.    DEPRESSION/ANXIETY/STRESS Saw Dr. Nicolasa Ducking on 10/08/20 -- medication changes made.  Stopped Seroquel and Buspar.  Started Duloxetine, currently taking 30 MG BID, started Trazodone -- taking 100 MG BID.  Taking Folic acid to help with fibromyalgia fog, which she has felt was worse lately.  Is working with therapist -- Doreene Burke, via Dr. Nicolasa Ducking office as well.  Feels the Duloxetine is helping mood, but is causing neuropathy pain.  Has been monitoring BP with Duloxetine -- concerned about elevations.    Does have a resting tremor, at times active, at baseline.  She feels this is getting worse.  Bilateral upper extremities, L>R.  She continues on Ozempic for weight loss -- currently taking 0.5 MG weekly and tolerating well.  She is currently down 3 pounds. Duration: stable Anxious mood: yes  Excessive worrying: yes Irritability: yes  Sweating: no Nausea: no Palpitations:no Hyperventilation: no Panic attacks: no Agoraphobia: no  Obscessions/compulsions: no Depressed mood: yes Depression screen [facility] 2/9 10/23/2020 09/10/2020 08/07/2020 07/23/2020 07/10/2020  Decreased Interest '1 2 3 2 3  '$ Down, Depressed, Hopeless '1 2 3 3 3  '$ PHQ - 2 Score '2 4 6 5 6  '$ Altered sleeping '2 2 3 3 3  '$ Tired, decreased energy '1 2 3 3 3  '$ Change in appetite '1 3 3 3 3  '$ Feeling bad or failure about yourself  0 2 0 3 3  Trouble concentrating 0 '1 3 3 3  '$ Moving slowly or  fidgety/restless 0 0 3 3 0  Suicidal thoughts 0 0 0 2 0  PHQ-9 Score '6 14 21 25 21  '$ Difficult doing work/chores Somewhat difficult Somewhat difficult - Very difficult -  Some recent data might be hidden   Anhedonia: no Weight changes: no Insomnia: yes hard to stay asleep  Hypersomnia: no Fatigue/loss of energy: yes Feelings of worthlessness: yes Feelings of guilt: yes Impaired concentration/indecisiveness: yes Suicidal ideations: no  Crying spells: yes Recent Stressors/Life Changes: yes   Relationship problems: no   Family stress: yes     Financial stress: no    Job stress: no    Recent death/loss: no GAD 7 : Generalized Anxiety Score 10/23/2020 09/10/2020 08/07/2020 07/10/2020  Nervous, Anxious, on Edge '2 2 3 3  '$ Control/stop worrying '2 3 3 3  '$ Worry too much - different things '1 3 2 2  '$ Trouble relaxing '1 2 2 2  '$ Restless '1 1 1 1  '$ Easily annoyed or irritable '1 2 2 2  '$ Afraid - awful might happen '1 1 2 2  '$ Total GAD 7 Score '9 14 15 15  '$ Anxiety Difficulty Somewhat difficult Somewhat difficult Somewhat difficult Very difficult   Relevant past medical, surgical, family and social history reviewed and updated as indicated. Interim medical history since our last visit reviewed. Allergies and medications  reviewed and updated.  Review of Systems  Constitutional:  Negative for activity change, appetite change, diaphoresis, fatigue and unexpected weight change.  Respiratory: Negative.    Cardiovascular: Negative.   Gastrointestinal: Negative.   Neurological: Negative.   Psychiatric/Behavioral:  Positive for decreased concentration and sleep disturbance. Negative for self-injury and suicidal ideas. The patient is nervous/anxious.    Per HPI unless specifically indicated above     Objective:    BP 119/79   Pulse 84   Wt 284 lb (128.8 kg)   LMP 02/09/2017 (Within Weeks)   SpO2 97%   BMI 43.18 kg/m   Wt Readings from Last 3 Encounters:  10/23/20 284 lb (128.8 kg)  09/10/20 287 lb  3.2 oz (130.3 kg)  08/07/20 284 lb 9.6 oz (129.1 kg)    Physical Exam Vitals and nursing note reviewed.  Constitutional:      General: She is awake. She is not in acute distress.    Appearance: She is well-developed and well-groomed. She is morbidly obese. She is not ill-appearing.  HENT:     Head: Normocephalic.     Right Ear: Hearing normal.     Left Ear: Hearing normal.  Eyes:     General: Lids are normal.        Right eye: No discharge.        Left eye: No discharge.     Conjunctiva/sclera: Conjunctivae normal.     Pupils: Pupils are equal, round, and reactive to light.  Neck:     Thyroid: No thyromegaly.     Vascular: No carotid bruit or JVD.  Cardiovascular:     Rate and Rhythm: Normal rate and regular rhythm.     Heart sounds: Normal heart sounds. No murmur heard.   No gallop.  Pulmonary:     Effort: Pulmonary effort is normal. No accessory muscle usage or respiratory distress.     Breath sounds: Normal breath sounds.  Abdominal:     General: Bowel sounds are normal.     Palpations: Abdomen is soft.  Musculoskeletal:     Cervical back: Normal range of motion and neck supple.     Right lower leg: No edema.     Left lower leg: No edema.  Lymphadenopathy:     Cervical: No cervical adenopathy.  Skin:    General: Skin is warm and dry.  Neurological:     Mental Status: She is alert and oriented to person, place, and time.     Cranial Nerves: Cranial nerves are intact.     Motor: Tremor present.     Gait: Gait is intact.     Deep Tendon Reflexes:     Reflex Scores:      Brachioradialis reflexes are 1+ on the right side and 1+ on the left side.      Patellar reflexes are 1+ on the right side and 1+ on the left side.    Comments: Bilateral upper extremity tremor L>R -- varies from resting to active tremor.  No rigidity noted or cogwheel.  Normal gait.  No flat affect.  Psychiatric:        Attention and Perception: Attention normal.        Mood and Affect: Mood normal.         Speech: Speech normal.        Behavior: Behavior normal. Behavior is cooperative.        Thought Content: Thought content normal.   Results for orders placed or performed in visit on  08/14/20  CBC and differential  Result Value Ref Range   Hemoglobin 13.1 12.0 - 16.0   HCT 41 36 - 46   Platelets 373 150 - 399   WBC 7.1   CBC  Result Value Ref Range   RBC 4.75 3.87 - 5.11  VITAMIN D 25 Hydroxy (Vit-D Deficiency, Fractures)  Result Value Ref Range   Vit D, 25-Hydroxy 123456   Basic metabolic panel  Result Value Ref Range   Glucose 92    BUN 7 4 - 21   CO2 25 (A) 13 - 22   Creatinine 0.8 0.5 - 1.1   Potassium 4.2 3.4 - 5.3   Sodium 138 137 - 147   Chloride 99 99 - 108  Lipid panel  Result Value Ref Range   LDl/HDL Ratio 1.3    Triglycerides 113 40 - 160   HDL 56 35 - 70   LDL Cholesterol 74   Hemoglobin A1c  Result Value Ref Range   Hemoglobin A1C 5.5       Assessment & Plan:   Problem List Items Addressed This Visit       Other   BMI 40.0-44.9, adult ([place])    Has lost 4 pounds, praised for this. BMI 43.18. Will continue Ozempic -- sent refills in and discussed at length with her.  Suspect if we can get psych med stabilized then may offer benefit to weight management.  Return in 8 weeks.       Depression - Primary    Chronic, ongoing, currently being followed by psychiatry and therapy.  Denies SI/HI.  Continue current medication regimen as prescribed by them.  Discussed at length with her today.  She is to report all side effect concerns to them.  Return in 8 weeks.       Relevant Medications   traZODone (DESYREL) 100 MG tablet   DULoxetine (CYMBALTA) 30 MG capsule   Situational anxiety    Chronic, ongoing, currently being followed by psychiatry and therapy.  Denies SI/HI.  Continue current medication regimen as prescribed by them.  Discussed at length with her today.  Return in 8 weeks.       Relevant Medications   traZODone (DESYREL) 100 MG tablet    DULoxetine (CYMBALTA) 30 MG capsule   Tremor of both hands    Referral to neurology placed for further assessment.  Suspect more essential vs Parkinson's related.  Possibly worse with Seroquel, which is now discontinued.  Return in 8 weeks.       Relevant Orders   Ambulatory referral to Neurology     Time: 35 minutes, >50% spent counseling and therapeutic listening   Follow up plan: Return in about 8 weeks (around 12/18/2020) for MOOD, FIBROMYALGIA, TREMOR, WEIGHT CHECK.

## 2020-10-23 NOTE — Patient Instructions (Signed)
Essential Tremor A tremor is trembling or shaking that a person cannot control. Most tremors affect the hands or arms. Tremors can also affect the head, vocal cords, legs, and other parts of the body. Essential tremor is a tremor without a known cause. Usually, it occurs while a person is trying to perform an action. Ittends to get worse gradually as a person ages. What are the causes? The cause of this condition is not known. What increases the risk? You are more likely to develop this condition if: You have a family member with essential tremor. You are age 40 or older. You take certain medicines. What are the signs or symptoms? The main sign of a tremor is a rhythmic shaking of certain parts of your body that is uncontrolled and unintentional. You may: Have difficulty eating with a spoon or fork. Have difficulty writing. Nod your head up and down or side to side. Have a quivering voice. The shaking may: Get worse over time. Come and go. Be more noticeable on one side of your body. Get worse due to stress, fatigue, caffeine, and extreme heat or cold. How is this diagnosed? This condition may be diagnosed based on: Your symptoms and medical history. A physical exam. There is no single test to diagnose an essential tremor. However, your health care provider may order tests to rule out other causes of your condition. These may include: Blood and urine tests. Imaging studies of your brain, such as CT scan and MRI. A test that measures involuntary muscle movement (electromyogram). How is this treated? Treatment for essential tremor depends on the severity of the condition. Some tremors may go away without treatment. Mild tremors may not need treatment if they do not affect your day-to-day life. Severe tremors may need to be treated using one or more of the following options: Medicines. Lifestyle changes. Occupational or physical therapy. Follow these instructions at  home: Lifestyle  Do not use any products that contain nicotine or tobacco, such as cigarettes and e-cigarettes. If you need help quitting, ask your health care provider. Limit your caffeine intake as told by your health care provider. Try to get 8 hours of sleep each night. Find ways to manage your stress that fits your lifestyle and personality. Consider trying meditation or yoga. Try to anticipate stressful situations and allow extra time to manage them. If you are struggling emotionally with the effects of your tremor, consider working with a mental health provider.  General instructions Take over-the-counter and prescription medicines only as told by your health care provider. Avoid extreme heat and extreme cold. Keep all follow-up visits as told by your health care provider. This is important. Visits may include physical therapy visits. Contact a health care provider if: You experience any changes in the location or intensity of your tremors. You start having a tremor after starting a new medicine. You have tremor with other symptoms, such as: Numbness. Tingling. Pain. Weakness. Your tremor gets worse. Your tremor interferes with your daily life. You feel down, blue, or sad for at least 2 weeks in a row. Worrying about your tremor and what other people think about you interferes with your everyday life functions, including relationships, work, or school. Summary Essential tremor is a tremor without a known cause. Usually, it occurs when you are trying to perform an action. You are more likely to develop this condition if you have a family member with essential tremor. The main sign of a tremor is a rhythmic shaking of   certain parts of your body that is uncontrolled and unintentional. Treatment for essential tremor depends on the severity of the condition. This information is not intended to replace advice given to you by your health care provider. Make sure you discuss any  questions you have with your healthcare provider. Document Revised: 12/08/2019 Document Reviewed: 12/08/2019 Elsevier Patient Education  2022 Elsevier Inc.  

## 2020-10-24 ENCOUNTER — Other Ambulatory Visit (INDEPENDENT_AMBULATORY_CARE_PROVIDER_SITE_OTHER): Payer: Self-pay | Admitting: Family Medicine

## 2020-10-24 DIAGNOSIS — E559 Vitamin D deficiency, unspecified: Secondary | ICD-10-CM

## 2020-10-25 DIAGNOSIS — Z20822 Contact with and (suspected) exposure to covid-19: Secondary | ICD-10-CM | POA: Diagnosis not present

## 2020-10-28 NOTE — Telephone Encounter (Signed)
Pt last seen by Dr. Beasley.  

## 2020-10-29 DIAGNOSIS — F331 Major depressive disorder, recurrent, moderate: Secondary | ICD-10-CM | POA: Diagnosis not present

## 2020-10-29 DIAGNOSIS — F411 Generalized anxiety disorder: Secondary | ICD-10-CM | POA: Diagnosis not present

## 2020-10-29 DIAGNOSIS — F4312 Post-traumatic stress disorder, chronic: Secondary | ICD-10-CM | POA: Diagnosis not present

## 2020-10-29 DIAGNOSIS — F5105 Insomnia due to other mental disorder: Secondary | ICD-10-CM | POA: Diagnosis not present

## 2020-11-19 ENCOUNTER — Other Ambulatory Visit (INDEPENDENT_AMBULATORY_CARE_PROVIDER_SITE_OTHER): Payer: Self-pay | Admitting: Family Medicine

## 2020-11-19 DIAGNOSIS — M170 Bilateral primary osteoarthritis of knee: Secondary | ICD-10-CM

## 2020-11-19 DIAGNOSIS — E559 Vitamin D deficiency, unspecified: Secondary | ICD-10-CM

## 2020-11-19 NOTE — Telephone Encounter (Signed)
Dr.Beasley 

## 2020-11-22 ENCOUNTER — Other Ambulatory Visit: Payer: Self-pay

## 2020-11-22 ENCOUNTER — Ambulatory Visit (INDEPENDENT_AMBULATORY_CARE_PROVIDER_SITE_OTHER): Payer: BC Managed Care – PPO

## 2020-11-22 DIAGNOSIS — E538 Deficiency of other specified B group vitamins: Secondary | ICD-10-CM

## 2020-11-26 DIAGNOSIS — F331 Major depressive disorder, recurrent, moderate: Secondary | ICD-10-CM | POA: Diagnosis not present

## 2020-11-26 DIAGNOSIS — F411 Generalized anxiety disorder: Secondary | ICD-10-CM | POA: Diagnosis not present

## 2020-11-26 DIAGNOSIS — F4312 Post-traumatic stress disorder, chronic: Secondary | ICD-10-CM | POA: Diagnosis not present

## 2020-11-26 DIAGNOSIS — F5105 Insomnia due to other mental disorder: Secondary | ICD-10-CM | POA: Diagnosis not present

## 2020-12-03 DIAGNOSIS — R251 Tremor, unspecified: Secondary | ICD-10-CM | POA: Diagnosis not present

## 2020-12-03 DIAGNOSIS — R202 Paresthesia of skin: Secondary | ICD-10-CM | POA: Diagnosis not present

## 2020-12-03 DIAGNOSIS — G259 Extrapyramidal and movement disorder, unspecified: Secondary | ICD-10-CM | POA: Diagnosis not present

## 2020-12-03 DIAGNOSIS — F32A Depression, unspecified: Secondary | ICD-10-CM | POA: Diagnosis not present

## 2020-12-05 ENCOUNTER — Other Ambulatory Visit: Payer: Self-pay | Admitting: Neurology

## 2020-12-05 DIAGNOSIS — G259 Extrapyramidal and movement disorder, unspecified: Secondary | ICD-10-CM

## 2020-12-09 NOTE — Telephone Encounter (Signed)
error 

## 2020-12-10 ENCOUNTER — Other Ambulatory Visit: Payer: Self-pay

## 2020-12-10 ENCOUNTER — Ambulatory Visit: Payer: BC Managed Care – PPO | Admitting: Cardiovascular Disease

## 2020-12-10 ENCOUNTER — Encounter: Payer: Self-pay | Admitting: Cardiovascular Disease

## 2020-12-10 VITALS — BP 110/80 | HR 88 | Ht 69.0 in | Wt 277.1 lb

## 2020-12-10 DIAGNOSIS — I493 Ventricular premature depolarization: Secondary | ICD-10-CM | POA: Diagnosis not present

## 2020-12-10 DIAGNOSIS — G8929 Other chronic pain: Secondary | ICD-10-CM | POA: Diagnosis not present

## 2020-12-10 DIAGNOSIS — G4730 Sleep apnea, unspecified: Secondary | ICD-10-CM

## 2020-12-10 DIAGNOSIS — M25561 Pain in right knee: Secondary | ICD-10-CM | POA: Diagnosis not present

## 2020-12-10 DIAGNOSIS — M25562 Pain in left knee: Secondary | ICD-10-CM | POA: Diagnosis not present

## 2020-12-10 DIAGNOSIS — M170 Bilateral primary osteoarthritis of knee: Secondary | ICD-10-CM | POA: Diagnosis not present

## 2020-12-10 MED ORDER — OZEMPIC (1 MG/DOSE) 4 MG/3ML ~~LOC~~ SOPN: 1.0000 mg | PEN_INJECTOR | SUBCUTANEOUS | 4 refills | Status: DC

## 2020-12-10 NOTE — Patient Instructions (Signed)

## 2020-12-10 NOTE — Progress Notes (Signed)
Cardiology Office Note   Date:  12/10/2020   ID:  Cathy Mitchell, DOB July 08, 1965, MRN [id number]  PCP:  Venita Lick, NP  Cardiologist:   Kathlyn Sacramento, MD   Chief Complaint  Patient presents with   Other    6 month f/u no complaints today. Meds reviewed verbally with pt.       History of Present Illness: Cathy Mitchell is a 55 y.o. female who is here for a follow-up visit regarding frequent symptomatic PVCs. Previous treadmill stress test in 2016 before gastric bypass surgery was normal.   She has no history of diabetes or hyperlipidemia. She does have sleep apnea.  She has no family history of premature coronary artery disease.  She is not a smoker and does not consume excessive amount of caffeine.   Holter monitor in December 2018 showed very frequent PVCs with a total of 24,000 beats in 48 hours representing 12% burden. Echocardiogram showed normal LV systolic function with mild mitral regurgitation.   Her PVCs responded better to diltiazem than metoprolol. Repeat Holter monitor showed 5000 PVCs in 24 hours representing a 5% burden.   She reported worsening palpitations during most recent visit and thus an outpatient monitor was done which showed frequent PVCs with a burden of 13.6%.  The dose of diltiazem was increased to 240 mg once daily.  She reports significant improvement in symptoms since then.  No chest pain or worsening dyspnea.  She is now using CPAP for her sleep apnea.  She is getting work-up for tremors by neurology and is having a brain MRI tomorrow.   Past Medical History:  Diagnosis Date   Anxiety    Arthritis    Back pain    Depression    Fibromyalgia    Food allergy    [place]   Hypertension    no longer has hypertension   Joint pain    Kidney problem    Neuromuscular disorder (HCC)    L-sided tremors   Osteoarthritis    Other fatigue    Rheumatoid arthritis ([place])    Sleep apnea    does not use C-PAP    Past Surgical History:   Procedure Laterality Date   CARPAL TUNNEL RELEASE Bilateral 2007   DILATION AND CURETTAGE OF UTERUS     GANGLION CYST EXCISION Bilateral    GANGLION CYST EXCISION Right 10/08/2015   Procedure: REMOVAL GANGLION OF WRIST;  Surgeon: Hessie Knows, MD;  Location: [facility];  Service: Orthopedics;  Laterality: Right;   GASTRIC BYPASS     HAND SURGERY Bilateral Resection   HYSTEROSCOPY WITH D & C N/A 05/24/2017   Procedure: DILATATION AND CURETTAGE /HYSTEROSCOPY;  Surgeon: Defrancesco, Alanda Slim, MD;  Location: [facility];  Service: Gynecology;  Laterality: N/A;   INCONTINENCE SURGERY  2009   LEEP N/A 05/24/2017   Procedure: LOOP ELECTROSURGICAL EXCISION PROCEDURE (LEEP);  Surgeon: Brayton Mars, MD;  Location: [facility];  Service: Gynecology;  Laterality: N/A;   TUBAL LIGATION       Current Outpatient Medications  Medication Sig Dispense Refill   acetaminophen (TYLENOL) 500 MG tablet Take 1,500 mg by mouth daily as needed for mild pain.     celecoxib (CELEBREX) 100 MG capsule TAKE 1 CAPSULE BY MOUTH EVERY DAY AS NEEDED FOR PAIN 30 capsule 3   dicyclomine (BENTYL) 20 MG tablet TAKE 1 TABLET BY MOUTH THREE TIMES A DAY 270 tablet 1   diltiazem (CARDIZEM CD) 240 MG 24 hr capsule  Take 1 capsule (240 mg total) by mouth daily. 90 capsule 2   DULoxetine (CYMBALTA) 30 MG capsule Take 30 mg by mouth 2 (two) times daily.     folic acid (FOLVITE) [id number] MCG tablet Take 800 mcg by mouth daily.     Insulin Pen Needle (NOVOFINE) 30G X 8 MM MISC To use to inject Ozempic into skin weekly 100 each 4   meclizine (ANTIVERT) 25 MG tablet Take 1 tablet (25 mg total) by mouth 3 (three) times daily as needed for dizziness. 30 tablet 0   Olopatadine HCl (PATADAY) 0.2 % SOLN Apply 1 drop to eye daily. Apply one drop to each eye daily as needed for dry and itchy eyes. 2.5 mL 5   Semaglutide,0.25 or 0.'5MG'$ /DOS, (OZEMPIC, 0.25 OR 0.5 MG/DOSE,) 2 MG/1.5ML SOPN Inject 0.5 MG into skin weekly. 4.5 mL 3   traZODone (DESYREL)  100 MG tablet Take 200 mg by mouth at bedtime.     Vitamin D, Ergocalciferol, (DRISDOL) 1.25 MG (50000 UNIT) CAPS capsule Take 1 capsule (50,000 Units total) by mouth every 3 (three) days. 10 capsule 0   Ascorbic Acid (VITAMIN C) 1000 MG tablet Take 1,000 mg by mouth daily. (Patient not taking: Reported on 12/10/2020)     CALCIUM-MAGNESIUM-ZINC PO Take 1 Dose by mouth daily. (Patient not taking: Reported on 12/10/2020)     fluticasone (FLONASE) 50 MCG/ACT nasal spray Place 2 sprays into both nostrils daily as needed for allergies. (Patient not taking: Reported on 12/10/2020) 16 g 6   glucosamine-chondroitin 500-400 MG tablet Take 1 tablet by mouth daily. (Patient not taking: Reported on 12/10/2020)     Multiple Vitamins-Minerals (MULTIPLE VITAMINS/WOMENS) tablet Take 2 tablets by mouth daily. women's 50+ (Patient not taking: Reported on 12/10/2020)     Potassium 99 MG TABS Take 99 mg by mouth daily. (Patient not taking: Reported on 12/10/2020)     Current Facility-Administered Medications  Medication Dose Route Frequency Provider Last Rate Last Admin   cyanocobalamin ((VITAMIN B-12)) injection 1,000 mcg  1,000 mcg Intramuscular Q30 days Marnee Guarneri T, NP   1,000 mcg at 11/22/20 0802    Allergies:   Oxycodone-acetaminophen, Other, and Percocet [oxycodone-acetaminophen]    Social History:  The patient  reports that she quit smoking about 5 years ago. Her smoking use included cigarettes. She has a 14.50 pack-year smoking history. She has never used smokeless tobacco. She reports that she does not drink alcohol and does not use drugs.   Family History:  The patient's family history includes Anxiety disorder in her father and mother; COPD in her father; Depression in her father and mother; Diabetes in her paternal grandfather; Eczema in her son; Heart disease in her maternal grandfather and paternal grandfather; Hypertension in her father and mother; Obesity in her father and mother; Thyroid disease in  her daughter.    ROS:  Please see the history of present illness.   Otherwise, review of systems are positive for none.   All other systems are reviewed and negative.    PHYSICAL EXAM: VS:  BP 110/80 (BP Location: Left Arm, Patient Position: Sitting, Cuff Size: Large)   Pulse 88   Ht '5\' 9"'$  (1.753 m)   Wt 277 lb 2 oz (125.7 kg)   LMP 02/09/2017 (Within Weeks)   SpO2 96%   BMI 40.92 kg/m  , BMI Body mass index is 40.92 kg/m. GEN: Well nourished, well developed, in no acute distress  HEENT: normal  Neck: no JVD, carotid bruits, or masses  Cardiac: RRR with premature beats; no murmurs, rubs, or gallops,no edema  Respiratory:  clear to auscultation bilaterally, normal work of breathing GI: soft, nontender, nondistended, + BS MS: no deformity or atrophy  Skin: warm and dry, no rash Neuro:  Strength and sensation are intact Psych: euthymic mood, full affect   EKG:  EKG is ordered today. The ekg ordered today demonstrates normal sinus rhythm with no PVCs.  Poor R wave progression in the anterior leads.   Recent Labs: 03/05/2020: ALT 13; TSH 0.91 07/23/2020: BUN 7; Creatinine 0.8; Hemoglobin 13.1; Platelets 373; Potassium 4.2; Sodium 138    Lipid Panel    Component Value Date/Time   CHOL 165 03/05/2020 0000   CHOL 145 07/31/2016 1110   TRIG 113 07/23/2020 0000   HDL 56 07/23/2020 0000   HDL 48 07/31/2016 1110   CHOLHDL 4.3 12/03/2014 1619   VLDL 27 12/03/2014 1619   LDLCALC 74 07/23/2020 0000   LDLCALC 73 07/31/2016 1110      Wt Readings from Last 3 Encounters:  12/10/20 277 lb 2 oz (125.7 kg)  10/23/20 284 lb (128.8 kg)  09/10/20 287 lb 3.2 oz (130.3 kg)       PAD Screen 03/02/2017  Previous PAD dx? No  Previous surgical procedure? No  Pain with walking? No  Feet/toe relief with dangling? No  Painful, non-healing ulcers? No  Extremities discolored? No      ASSESSMENT AND PLAN:  1. Symptomatic PVCs: Significant improvement in symptoms since the dose of  diltiazem was increased to 240 mg once daily.  No PVCs noted on her twelve-lead EKG today but she does have some by auscultation.  2. Sleep apnea: She is now using the CPAP machine which should help with arrhythmia.    Disposition:   FU with me in 12 months  Signed,  Kathlyn Sacramento, MD  12/10/2020 3:28 PM    [place]

## 2020-12-11 ENCOUNTER — Ambulatory Visit
Admission: RE | Admit: 2020-12-11 | Discharge: 2020-12-11 | Disposition: A | Discharge status: Home / Self Care | Payer: BC Managed Care – PPO | Source: Ambulatory Visit | Attending: Neurology | Admitting: Neurology

## 2020-12-11 DIAGNOSIS — G259 Extrapyramidal and movement disorder, unspecified: Secondary | ICD-10-CM | POA: Diagnosis not present

## 2020-12-13 ENCOUNTER — Other Ambulatory Visit: Payer: Self-pay | Admitting: Nurse Practitioner

## 2020-12-13 ENCOUNTER — Other Ambulatory Visit (INDEPENDENT_AMBULATORY_CARE_PROVIDER_SITE_OTHER): Payer: Self-pay | Admitting: Family Medicine

## 2020-12-13 DIAGNOSIS — E559 Vitamin D deficiency, unspecified: Secondary | ICD-10-CM

## 2020-12-14 NOTE — Telephone Encounter (Signed)
Requested Prescriptions  Pending Prescriptions Disp Refills  . celecoxib (CELEBREX) 100 MG capsule [Pharmacy Med Name: CELECOXIB 100 MG CAPSULE] 30 capsule 3    Sig: TAKE 1 CAPSULE BY MOUTH EVERY DAY AS NEEDED FOR PAIN     Analgesics:  COX2 Inhibitors Passed - 12/13/2020  3:18 PM      Passed - HGB in normal range and within 360 days    Hemoglobin  Date Value Ref Range Status  07/23/2020 13.1 12.0 - 16.0 Final  03/22/2017 13.2 11.1 - 15.9 g/dL Final         Passed - Cr in normal range and within 360 days    Creatinine  Date Value Ref Range Status  07/23/2020 0.8 0.5 - 1.1 Final  06/19/2012 0.77 0.60 - 1.30 mg/dL Final   Creatinine, Ser  Date Value Ref Range Status  03/08/2018 0.53 0.44 - 1.00 mg/dL Final         Passed - Patient is not pregnant      Passed - Valid encounter within last 12 months    Recent Outpatient Visits          1 month ago Moderate episode of recurrent major depressive disorder ([place])   [place], Jolene T, NP   3 months ago Moderate episode of recurrent major depressive disorder ([place])   [place], Jolene T, NP   4 months ago Moderate episode of recurrent major depressive disorder ([place])   [place], Jolene T, NP   5 months ago Moderate episode of recurrent major depressive disorder ([place])   [place], Jolene T, NP   6 months ago Moderate episode of recurrent major depressive disorder ([place])   [place], Barbaraann Faster, NP      Future Appointments            In 4 days [place], Barbaraann Faster, NP [employer]

## 2020-12-16 DIAGNOSIS — M25561 Pain in right knee: Secondary | ICD-10-CM | POA: Diagnosis not present

## 2020-12-16 DIAGNOSIS — M25562 Pain in left knee: Secondary | ICD-10-CM | POA: Diagnosis not present

## 2020-12-16 DIAGNOSIS — Z0189 Encounter for other specified special examinations: Secondary | ICD-10-CM | POA: Diagnosis not present

## 2020-12-16 DIAGNOSIS — G8929 Other chronic pain: Secondary | ICD-10-CM | POA: Diagnosis not present

## 2020-12-18 ENCOUNTER — Ambulatory Visit: Payer: BC Managed Care – PPO | Admitting: Nurse Practitioner

## 2020-12-18 ENCOUNTER — Encounter: Payer: Self-pay | Admitting: Nurse Practitioner

## 2020-12-18 ENCOUNTER — Other Ambulatory Visit (INDEPENDENT_AMBULATORY_CARE_PROVIDER_SITE_OTHER): Payer: Self-pay | Admitting: Family Medicine

## 2020-12-18 ENCOUNTER — Other Ambulatory Visit: Payer: Self-pay

## 2020-12-18 VITALS — BP 114/79 | HR 76 | Temp 98.8°F | Wt 275.8 lb

## 2020-12-18 DIAGNOSIS — E538 Deficiency of other specified B group vitamins: Secondary | ICD-10-CM

## 2020-12-18 DIAGNOSIS — Z23 Encounter for immunization: Secondary | ICD-10-CM | POA: Diagnosis not present

## 2020-12-18 DIAGNOSIS — Z6841 Body Mass Index (BMI) 40.0 and over, adult: Secondary | ICD-10-CM | POA: Diagnosis not present

## 2020-12-18 DIAGNOSIS — F331 Major depressive disorder, recurrent, moderate: Secondary | ICD-10-CM | POA: Diagnosis not present

## 2020-12-18 DIAGNOSIS — E559 Vitamin D deficiency, unspecified: Secondary | ICD-10-CM

## 2020-12-18 DIAGNOSIS — M797 Fibromyalgia: Secondary | ICD-10-CM

## 2020-12-18 DIAGNOSIS — G259 Extrapyramidal and movement disorder, unspecified: Secondary | ICD-10-CM

## 2020-12-18 DIAGNOSIS — F418 Other specified anxiety disorders: Secondary | ICD-10-CM

## 2020-12-18 MED ORDER — GABAPENTIN 100 MG PO CAPS: 300.0000 mg | ORAL_CAPSULE | Freq: Every day | ORAL | 4 refills | Status: DC

## 2020-12-18 NOTE — Assessment & Plan Note (Signed)
Refer to obesity plan of care. 

## 2020-12-18 NOTE — Assessment & Plan Note (Signed)
Chronic, ongoing, currently being followed by psychiatry and therapy.  Denies SI/HI.  Continue current medication regimen as prescribed by them.  Discussed at length with her today.  Return in 8 weeks.

## 2020-12-18 NOTE — Assessment & Plan Note (Signed)
BMI 40.73 -- has lost 10 pounds since July, praised for this.  Continue Ozempic at 1 MG, as is offering benefit overall.  Recommended eating smaller high protein, low fat meals more frequently and exercising 30 mins a day 5 times a week with a goal of 10-15lb weight loss in the next 3 months. Patient voiced their understanding and motivation to adhere to these recommendations.

## 2020-12-18 NOTE — Progress Notes (Signed)
BP 114/79   Pulse 76   Temp 98.8 F (37.1 C) (Oral)   Wt 275 lb 12.8 oz (125.1 kg)   LMP 02/09/2017 (Within Weeks)   SpO2 97%   BMI 40.73 kg/m    Subjective:    Patient ID: Cathy Mitchell, female    DOB: [birth year], 55 y.o.   MRN: [id number]  HPI: Cathy Mitchell is a 55 y.o. female  Chief Complaint  Patient presents with   Mood   Tremors   Weight Check   Fibromyalgia   Medication Problem    Patient states her PT wants her to up her dosage of Gabapentin from 100 mg to 300 mg. Patient would like to also discuss if there is a chewable the provider recommends to replace her current vitamins. Patient states it is a lot to take a lot of pills throughout the day.    Immunizations    Patient would like to discuss if she can have her Flu and Pneumonia vaccines at today's visit.    [facility] Seeing neurology for tremor and fibromyalgia with recent MRI performed.  She is attending PT and to return to see neurology on 02/03/21.  She would like to increase her Gabapentin to 300 MG daily, as PT has recommended this. Pain status: stable Satisfied with current treatment?: no Medication side effects: no Medication compliance: good compliance Duration:  Location:  Quality: dull, aching, and burning Current pain level: mild Previous pain level: mild Aggravating factors: lifting and movement Alleviating factors: Gabapentin Previous pain specialty evaluation: no Non-narcotic analgesic meds: no Narcotic contract:no Treatments attempted: Gabapentin and PT  DEPRESSION/ANXIETY/STRESS Followed by Dr. Nicolasa Ducking -- medication changes made.  Stopped Seroquel and Buspar.  Started Duloxetine, currently taking 30 MG BID, Trazodone 200 MG BID, Celexa 20 MG daily.  Taking Folic acid to help with fibromyalgia fog.  Is working with therapist -- Doreene Burke, via Dr. Nicolasa Ducking office as well.  Feels mood is doing better.  She continues on Ozempic for weight loss -- currently taking 1 MG weekly and tolerating  well.  She is currently down 10 pounds since July. Duration: stable Anxious mood: yes  Excessive worrying: yes Irritability: yes  Sweating: no Nausea: no Palpitations:no Hyperventilation: no Panic attacks: no Agoraphobia: no  Obscessions/compulsions: no Depressed mood: yes Depression screen [facility] 2/9 12/18/2020 10/23/2020 09/10/2020 08/07/2020 07/23/2020  Decreased Interest 1 1 2 3 2   Down, Depressed, Hopeless 1 1 2 3 3   PHQ - 2 Score 2 2 4 6 5   Altered sleeping 1 2 2 3 3   Tired, decreased energy 1 1 2 3 3   Change in appetite 1 1 3 3 3   Feeling bad or failure about yourself  0 0 2 0 3  Trouble concentrating 0 0 1 3 3   Moving slowly or fidgety/restless 0 0 0 3 3  Suicidal thoughts 0 0 0 0 2  PHQ-9 Score 5 6 14 21 25   Difficult doing work/chores Not difficult at all Somewhat difficult Somewhat difficult - Very difficult  Some recent data might be hidden   Anhedonia: no Weight changes: no Insomnia: yes hard to stay asleep  Hypersomnia: no Fatigue/loss of energy: yes Feelings of worthlessness: yes Feelings of guilt: yes Impaired concentration/indecisiveness: yes Suicidal ideations: no  Crying spells: yes Recent Stressors/Life Changes: yes   Relationship problems: no   Family stress: yes     Financial stress: no    Job stress: no    Recent death/loss: no GAD 7 : Generalized Anxiety  Score 12/18/2020 10/23/2020 09/10/2020 08/07/2020  Nervous, Anxious, on Edge 1 2 2 3   Control/stop worrying 1 2 3 3   Worry too much - different things 1 1 3 2   Trouble relaxing 1 1 2 2   Restless 0 1 1 1   Easily annoyed or irritable 0 1 2 2   Afraid - awful might happen 1 1 1 2   Total GAD 7 Score 5 9 14 15   Anxiety Difficulty Not difficult at all Somewhat difficult Somewhat difficult Somewhat difficult   Relevant past medical, surgical, family and social history reviewed and updated as indicated. Interim medical history since our last visit reviewed. Allergies and medications reviewed and  updated.  Review of Systems  Constitutional:  Negative for activity change, appetite change, diaphoresis, fatigue and unexpected weight change.  Respiratory: Negative.    Cardiovascular: Negative.   Gastrointestinal: Negative.   Neurological: Negative.   Psychiatric/Behavioral:  Positive for decreased concentration and sleep disturbance. Negative for self-injury and suicidal ideas. The patient is nervous/anxious.    Per HPI unless specifically indicated above     Objective:    BP 114/79   Pulse 76   Temp 98.8 F (37.1 C) (Oral)   Wt 275 lb 12.8 oz (125.1 kg)   LMP 02/09/2017 (Within Weeks)   SpO2 97%   BMI 40.73 kg/m   Wt Readings from Last 3 Encounters:  12/18/20 275 lb 12.8 oz (125.1 kg)  12/10/20 277 lb 2 oz (125.7 kg)  10/23/20 284 lb (128.8 kg)    Physical Exam Vitals and nursing note reviewed.  Constitutional:      General: She is awake. She is not in acute distress.    Appearance: She is well-developed and well-groomed. She is morbidly obese. She is not ill-appearing.  HENT:     Head: Normocephalic.     Right Ear: Hearing normal.     Left Ear: Hearing normal.  Eyes:     General: Lids are normal.        Right eye: No discharge.        Left eye: No discharge.     Conjunctiva/sclera: Conjunctivae normal.     Pupils: Pupils are equal, round, and reactive to light.  Neck:     Thyroid: No thyromegaly.     Vascular: No carotid bruit or JVD.  Cardiovascular:     Rate and Rhythm: Normal rate and regular rhythm.     Heart sounds: Normal heart sounds. No murmur heard.   No gallop.  Pulmonary:     Effort: Pulmonary effort is normal. No accessory muscle usage or respiratory distress.     Breath sounds: Normal breath sounds.  Abdominal:     General: Bowel sounds are normal.     Palpations: Abdomen is soft.  Musculoskeletal:     Cervical back: Normal range of motion and neck supple.     Right lower leg: No edema.     Left lower leg: No edema.  Lymphadenopathy:      Cervical: No cervical adenopathy.  Skin:    General: Skin is warm and dry.  Neurological:     Mental Status: She is alert and oriented to person, place, and time.     Cranial Nerves: Cranial nerves are intact.     Motor: Tremor present.     Gait: Gait is intact.     Deep Tendon Reflexes:     Reflex Scores:      Brachioradialis reflexes are 1+ on the right side and 1+ on the  left side.      Patellar reflexes are 1+ on the right side and 1+ on the left side.    Comments: Bilateral upper extremity tremor L>R -- varies from resting to active tremor.  No rigidity noted or cogwheel.  Normal gait.  No flat affect.  Psychiatric:        Attention and Perception: Attention normal.        Mood and Affect: Mood normal.        Speech: Speech normal.        Behavior: Behavior normal. Behavior is cooperative.        Thought Content: Thought content normal.   Results for orders placed or performed in visit on 08/14/20  CBC and differential  Result Value Ref Range   Hemoglobin 13.1 12.0 - 16.0   HCT 41 36 - 46   Platelets 373 150 - 399   WBC 7.1   CBC  Result Value Ref Range   RBC 4.75 3.87 - 5.11  VITAMIN D 25 Hydroxy (Vit-D Deficiency, Fractures)  Result Value Ref Range   Vit D, 25-Hydroxy 93.2   Basic metabolic panel  Result Value Ref Range   Glucose 92    BUN 7 4 - 21   CO2 25 (A) 13 - 22   Creatinine 0.8 0.5 - 1.1   Potassium 4.2 3.4 - 5.3   Sodium 138 137 - 147   Chloride 99 99 - 108  Lipid panel  Result Value Ref Range   LDl/HDL Ratio 1.3    Triglycerides 113 40 - 160   HDL 56 35 - 70   LDL Cholesterol 74   Hemoglobin A1c  Result Value Ref Range   Hemoglobin A1C 5.5       Assessment & Plan:   Problem List Items Addressed This Visit       Nervous and Auditory   Functional movement disorder    Followed by neurology at this time, continue this collaboration.  Recent note and MRI reviewed.        Other   Fibromyalgia (Chronic)    Chronic, ongoing, followed by  neurology and PT at this time.  Recent notes and MRI reviewed.  Will increase Gabapentin to 300 MG daily.      Relevant Medications   citalopram (CELEXA) 20 MG tablet   gabapentin (NEURONTIN) 100 MG capsule   Obesity    BMI 40.73 -- has lost 10 pounds since July, praised for this.  Continue Ozempic at 1 MG, as is offering benefit overall.  Recommended eating smaller high protein, low fat meals more frequently and exercising 30 mins a day 5 times a week with a goal of 10-15lb weight loss in the next 3 months. Patient voiced their understanding and motivation to adhere to these recommendations.       BMI 40.0-44.9, adult [facility])    Refer to obesity plan of care.      Depression - Primary    Chronic, ongoing, currently being followed by psychiatry and therapy.  Denies SI/HI.  Continue current medication regimen as prescribed by them.  Discussed at length with her today.  She is to report all side effect concerns to them.  Return in 8 weeks.      Relevant Medications   citalopram (CELEXA) 20 MG tablet   Situational anxiety    Chronic, ongoing, currently being followed by psychiatry and therapy.  Denies SI/HI.  Continue current medication regimen as prescribed by them.  Discussed at length with  her today.  Return in 8 weeks.      Relevant Medications   citalopram (CELEXA) 20 MG tablet    Time: 25 minutes, >50% spent counseling/or care coordination   Follow up plan: Return in about 8 weeks (around 02/12/2021) for WEIGHT CHECK.

## 2020-12-18 NOTE — Assessment & Plan Note (Signed)
Chronic, ongoing, currently being followed by psychiatry and therapy.  Denies SI/HI.  Continue current medication regimen as prescribed by them.  Discussed at length with her today.  She is to report all side effect concerns to them.  Return in 8 weeks.

## 2020-12-18 NOTE — Assessment & Plan Note (Signed)
Followed by neurology at this time, continue this collaboration.  Recent note and MRI reviewed.

## 2020-12-18 NOTE — Patient Instructions (Signed)

## 2020-12-18 NOTE — Assessment & Plan Note (Signed)
Chronic, ongoing, followed by neurology and PT at this time.  Recent notes and MRI reviewed.  Will increase Gabapentin to 300 MG daily.

## 2020-12-19 NOTE — Telephone Encounter (Signed)
Last OV with Dr. Beasley 

## 2020-12-25 DIAGNOSIS — F411 Generalized anxiety disorder: Secondary | ICD-10-CM | POA: Diagnosis not present

## 2020-12-25 DIAGNOSIS — F4312 Post-traumatic stress disorder, chronic: Secondary | ICD-10-CM | POA: Diagnosis not present

## 2020-12-25 DIAGNOSIS — F331 Major depressive disorder, recurrent, moderate: Secondary | ICD-10-CM | POA: Diagnosis not present

## 2020-12-25 DIAGNOSIS — F5105 Insomnia due to other mental disorder: Secondary | ICD-10-CM | POA: Diagnosis not present

## 2020-12-30 DIAGNOSIS — M25562 Pain in left knee: Secondary | ICD-10-CM | POA: Diagnosis not present

## 2020-12-30 DIAGNOSIS — G8929 Other chronic pain: Secondary | ICD-10-CM | POA: Diagnosis not present

## 2020-12-30 DIAGNOSIS — M25561 Pain in right knee: Secondary | ICD-10-CM | POA: Diagnosis not present

## 2021-01-03 DIAGNOSIS — M170 Bilateral primary osteoarthritis of knee: Secondary | ICD-10-CM | POA: Diagnosis not present

## 2021-01-07 DIAGNOSIS — R413 Other amnesia: Secondary | ICD-10-CM | POA: Diagnosis not present

## 2021-01-07 NOTE — Telephone Encounter (Signed)
Pt scheduled 10/13 for nurse visit

## 2021-01-09 ENCOUNTER — Other Ambulatory Visit: Payer: Self-pay

## 2021-01-09 ENCOUNTER — Ambulatory Visit (INDEPENDENT_AMBULATORY_CARE_PROVIDER_SITE_OTHER): Payer: BC Managed Care – PPO

## 2021-01-09 VITALS — Wt 270.0 lb

## 2021-01-09 DIAGNOSIS — E538 Deficiency of other specified B group vitamins: Secondary | ICD-10-CM | POA: Diagnosis not present

## 2021-01-09 DIAGNOSIS — Z23 Encounter for immunization: Secondary | ICD-10-CM

## 2021-01-10 NOTE — Progress Notes (Signed)
Patient presented today for B-12 injection, Shingrix Vaccination, Patient tolerated well.  B-injection was given in Left deltoid, Shingrix in Right deltoid. Patient also wanted a weight check at this time.

## 2021-01-14 DIAGNOSIS — R413 Other amnesia: Secondary | ICD-10-CM | POA: Diagnosis not present

## 2021-01-20 DIAGNOSIS — R29898 Other symptoms and signs involving the musculoskeletal system: Secondary | ICD-10-CM | POA: Diagnosis not present

## 2021-01-21 DIAGNOSIS — M25561 Pain in right knee: Secondary | ICD-10-CM | POA: Diagnosis not present

## 2021-01-21 DIAGNOSIS — G8929 Other chronic pain: Secondary | ICD-10-CM | POA: Diagnosis not present

## 2021-01-21 DIAGNOSIS — M25562 Pain in left knee: Secondary | ICD-10-CM | POA: Diagnosis not present

## 2021-01-23 ENCOUNTER — Ambulatory Visit (INDEPENDENT_AMBULATORY_CARE_PROVIDER_SITE_OTHER): Payer: BC Managed Care – PPO

## 2021-01-23 ENCOUNTER — Other Ambulatory Visit: Payer: Self-pay

## 2021-01-23 DIAGNOSIS — E538 Deficiency of other specified B group vitamins: Secondary | ICD-10-CM

## 2021-01-23 MED ORDER — CYANOCOBALAMIN 1000 MCG/ML IJ SOLN
1000.0000 ug | Freq: Once | INTRAMUSCULAR | Status: AC
  Administered 2021-01-23: 1000 ug via INTRAMUSCULAR

## 2021-01-27 DIAGNOSIS — G8929 Other chronic pain: Secondary | ICD-10-CM | POA: Diagnosis not present

## 2021-01-27 DIAGNOSIS — M25562 Pain in left knee: Secondary | ICD-10-CM | POA: Diagnosis not present

## 2021-01-27 DIAGNOSIS — M25561 Pain in right knee: Secondary | ICD-10-CM | POA: Diagnosis not present

## 2021-01-27 NOTE — Progress Notes (Deleted)
Established Patient Office Visit  Subjective:  Patient ID: Cathy Mitchell, female    DOB: 23-Aug-1965  Age: 55 y.o. MRN: [id number]  CC: No chief complaint on file.   HPI Cathy Mitchell presents for a cyst on the back of her head.   Past Medical History:  Diagnosis Date   Anxiety    Arthritis    Back pain    Depression    Fibromyalgia    Food allergy    [place]   Hypertension    no longer has hypertension   Joint pain    Kidney problem    Neuromuscular disorder (HCC)    L-sided tremors   Osteoarthritis    Other fatigue    Rheumatoid arthritis ([place])    Sleep apnea    does not use C-PAP    Past Surgical History:  Procedure Laterality Date   CARPAL TUNNEL RELEASE Bilateral 2007   DILATION AND CURETTAGE OF UTERUS     GANGLION CYST EXCISION Bilateral    GANGLION CYST EXCISION Right 10/08/2015   Procedure: REMOVAL GANGLION OF WRIST;  Surgeon: Hessie Knows, MD;  Location: [facility] OR[facility];  Service: Orthopedics;  Laterality: Right;   GASTRIC BYPASS     HAND SURGERY Bilateral Resection   HYSTEROSCOPY WITH D & C N/A 05/24/2017   Procedure: DILATATION AND CURETTAGE /HYSTEROSCOPY;  Surgeon: Defrancesco, Alanda Slim, MD;  Location: [facility];  Service: Gynecology;  Laterality: N/A;   INCONTINENCE SURGERY  2009   LEEP N/A 05/24/2017   Procedure: LOOP ELECTROSURGICAL EXCISION PROCEDURE (LEEP);  Surgeon: Brayton Mars, MD;  Location: [facility];  Service: Gynecology;  Laterality: N/A;   TUBAL LIGATION      Family History  Problem Relation Age of Onset   COPD Father    Hypertension Father    Depression Father    Anxiety disorder Father    Obesity Father    Hypertension Mother    Depression Mother    Anxiety disorder Mother    Obesity Mother    Thyroid disease Daughter    Eczema Son    Heart disease Maternal Grandfather    Diabetes Paternal Grandfather    Heart disease Paternal Grandfather     Social History   Socioeconomic History   Marital status: Married    Spouse  name: Jeneen Rinks   Number of children: 2   Years of education: Not on file   Highest education level: Not on file  Occupational History   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  Tobacco Use   Smoking status: Former    Packs/day: 0.50    Years: 29.00    Pack years: 14.50    Types: Cigarettes    Quit date: 01/29/2015    Years since quitting: 6.0   Smokeless tobacco: Never  Vaping Use   Vaping Use: Never used  Substance and Sexual Activity   Alcohol use: No    Alcohol/week: 0.0 standard drinks   Drug use: No   Sexual activity: Yes  Other Topics Concern   Not on file  Social History Narrative   Not on file   Social Determinants of Health   Financial Resource Strain: Not on file  Food Insecurity: Not on file  Transportation Needs: Not on file  Physical Activity: Not on file  Stress: Not on file  Social Connections: Not on file  Intimate Partner Violence: Not on file    Outpatient Medications Prior to Visit  Medication Sig Dispense Refill   acetaminophen (TYLENOL) 500 MG  tablet Take 1,500 mg by mouth daily as needed for mild pain.     celecoxib (CELEBREX) 100 MG capsule TAKE 1 CAPSULE BY MOUTH EVERY DAY AS NEEDED FOR PAIN 30 capsule 3   citalopram (CELEXA) 20 MG tablet Take by mouth.     dicyclomine (BENTYL) 20 MG tablet TAKE 1 TABLET BY MOUTH THREE TIMES A DAY 270 tablet 1   diltiazem (CARDIZEM CD) 240 MG 24 hr capsule Take 1 capsule (240 mg total) by mouth daily. 90 capsule 2   DULoxetine (CYMBALTA) 30 MG capsule Take 30 mg by mouth 2 (two) times daily.     folic acid (FOLVITE) 937 MCG tablet Take 800 mcg by mouth daily.     gabapentin (NEURONTIN) 100 MG capsule Take 3 capsules (300 mg total) by mouth at bedtime. 270 capsule 4   Insulin Pen Needle (NOVOFINE) 30G X 8 MM MISC To use to inject Ozempic into skin weekly 100 each 4   meclizine (ANTIVERT) 25 MG tablet Take 1 tablet (25 mg total) by mouth 3 (three) times daily as needed for dizziness. 30 tablet 0   Olopatadine HCl (PATADAY)  0.2 % SOLN Apply 1 drop to eye daily. Apply one drop to each eye daily as needed for dry and itchy eyes. 2.5 mL 5   Semaglutide, 1 MG/DOSE, (OZEMPIC, 1 MG/DOSE,) 4 MG/3ML SOPN Inject 1 mg into the skin once a week. 9 mL 4   traZODone (DESYREL) 100 MG tablet Take 200 mg by mouth at bedtime.     Vitamin D, Ergocalciferol, (DRISDOL) 1.25 MG (50000 UNIT) CAPS capsule Take 1 capsule (50,000 Units total) by mouth every 3 (three) days. 10 capsule 0   Facility-Administered Medications Prior to Visit  Medication Dose Route Frequency Provider Last Rate Last Admin   cyanocobalamin ((VITAMIN B-12)) injection 1,000 mcg  1,000 mcg Intramuscular Q30 days Marnee Guarneri T, NP   1,000 mcg at 01/09/21 9024    Allergies  Allergen Reactions   Oxycodone-Acetaminophen Itching and Other (See Comments)    Reaction:  Dizziness and blurred vision    Other Other (See Comments)    Patient states she is allergic to all narcotics   Percocet [Oxycodone-Acetaminophen] Nausea And Vomiting and Other (See Comments)    Reaction:  Dizziness and blurred vision  Reaction:  Dizziness and blurred vision     ROS Review of Systems    Objective:    Physical Exam  LMP 02/09/2017 (Within Weeks)  Wt Readings from Last 3 Encounters:  01/09/21 270 lb (122.5 kg)  12/18/20 275 lb 12.8 oz (125.1 kg)  12/10/20 277 lb 2 oz (125.7 kg)     Health Maintenance Due  Topic Date Due   PAP SMEAR-Modifier  05/05/2020   TETANUS/TDAP  12/28/2020    There are no preventive care reminders to display for this patient.  Lab Results  Component Value Date   TSH 0.91 03/05/2020   Lab Results  Component Value Date   WBC 7.1 07/23/2020   HGB 13.1 07/23/2020   HCT 41 07/23/2020   MCV 88.6 03/08/2018   PLT 373 07/23/2020   Lab Results  Component Value Date   NA 138 07/23/2020   K 4.2 07/23/2020   CO2 25 (A) 07/23/2020   GLUCOSE 97 03/08/2018   BUN 7 07/23/2020   CREATININE 0.8 07/23/2020   BILITOT 0.5 03/08/2018   ALKPHOS  107 03/05/2020   AST 12 (A) 03/05/2020   ALT 13 03/05/2020   PROT 7.4 03/08/2018   ALBUMIN  4.1 03/08/2018   CALCIUM 9.3 03/05/2020   ANIONGAP 7 03/08/2018   Lab Results  Component Value Date   CHOL 165 03/05/2020   Lab Results  Component Value Date   HDL 56 07/23/2020   Lab Results  Component Value Date   LDLCALC 74 07/23/2020   Lab Results  Component Value Date   TRIG 113 07/23/2020   Lab Results  Component Value Date   CHOLHDL 4.3 12/03/2014   Lab Results  Component Value Date   HGBA1C 5.5 07/23/2020      Assessment & Plan:   Problem List Items Addressed This Visit   None   No orders of the defined types were placed in this encounter.   Follow-up: No follow-ups on file.    Charyl Dancer, NP

## 2021-01-28 ENCOUNTER — Ambulatory Visit: Payer: BC Managed Care – PPO | Admitting: Nurse Practitioner

## 2021-02-03 DIAGNOSIS — G259 Extrapyramidal and movement disorder, unspecified: Secondary | ICD-10-CM | POA: Diagnosis not present

## 2021-02-03 DIAGNOSIS — R202 Paresthesia of skin: Secondary | ICD-10-CM | POA: Diagnosis not present

## 2021-02-03 DIAGNOSIS — G4733 Obstructive sleep apnea (adult) (pediatric): Secondary | ICD-10-CM | POA: Diagnosis not present

## 2021-02-03 DIAGNOSIS — R251 Tremor, unspecified: Secondary | ICD-10-CM | POA: Diagnosis not present

## 2021-02-06 ENCOUNTER — Other Ambulatory Visit: Payer: Self-pay

## 2021-02-06 ENCOUNTER — Ambulatory Visit (INDEPENDENT_AMBULATORY_CARE_PROVIDER_SITE_OTHER): Payer: BC Managed Care – PPO

## 2021-02-06 DIAGNOSIS — E538 Deficiency of other specified B group vitamins: Secondary | ICD-10-CM

## 2021-02-10 DIAGNOSIS — F331 Major depressive disorder, recurrent, moderate: Secondary | ICD-10-CM | POA: Diagnosis not present

## 2021-02-10 DIAGNOSIS — F4312 Post-traumatic stress disorder, chronic: Secondary | ICD-10-CM | POA: Diagnosis not present

## 2021-02-10 DIAGNOSIS — F5105 Insomnia due to other mental disorder: Secondary | ICD-10-CM | POA: Diagnosis not present

## 2021-02-10 DIAGNOSIS — F411 Generalized anxiety disorder: Secondary | ICD-10-CM | POA: Diagnosis not present

## 2021-02-12 ENCOUNTER — Other Ambulatory Visit: Payer: Self-pay

## 2021-02-12 ENCOUNTER — Encounter: Payer: Self-pay | Admitting: Nurse Practitioner

## 2021-02-12 ENCOUNTER — Ambulatory Visit: Payer: BC Managed Care – PPO | Admitting: Nurse Practitioner

## 2021-02-12 VITALS — BP 104/70 | HR 69 | Ht 67.0 in | Wt 260.2 lb

## 2021-02-12 DIAGNOSIS — E559 Vitamin D deficiency, unspecified: Secondary | ICD-10-CM | POA: Insufficient documentation

## 2021-02-12 DIAGNOSIS — F331 Major depressive disorder, recurrent, moderate: Secondary | ICD-10-CM | POA: Diagnosis not present

## 2021-02-12 DIAGNOSIS — Z6841 Body Mass Index (BMI) 40.0 and over, adult: Secondary | ICD-10-CM | POA: Diagnosis not present

## 2021-02-12 DIAGNOSIS — M79672 Pain in left foot: Secondary | ICD-10-CM

## 2021-02-12 DIAGNOSIS — G4733 Obstructive sleep apnea (adult) (pediatric): Secondary | ICD-10-CM

## 2021-02-12 DIAGNOSIS — F418 Other specified anxiety disorders: Secondary | ICD-10-CM

## 2021-02-12 DIAGNOSIS — M79671 Pain in right foot: Secondary | ICD-10-CM

## 2021-02-12 DIAGNOSIS — E538 Deficiency of other specified B group vitamins: Secondary | ICD-10-CM | POA: Insufficient documentation

## 2021-02-12 DIAGNOSIS — G259 Extrapyramidal and movement disorder, unspecified: Secondary | ICD-10-CM | POA: Diagnosis not present

## 2021-02-12 DIAGNOSIS — M797 Fibromyalgia: Secondary | ICD-10-CM

## 2021-02-12 DIAGNOSIS — G3184 Mild cognitive impairment, so stated: Secondary | ICD-10-CM

## 2021-02-12 NOTE — Assessment & Plan Note (Signed)
Followed by neurology at this time, continue this collaboration.  Recent note and MRI reviewed.  Will place referral to [facility] as was noted on recent neurology note.

## 2021-02-12 NOTE — Assessment & Plan Note (Signed)
Chronic, ongoing, currently being followed by psychiatry and therapy.  Denies SI/HI.  Continue current medication regimen as prescribed by them.  Discussed at length with her today.  Return in 6 weeks.

## 2021-02-12 NOTE — Assessment & Plan Note (Signed)
Chronic, ongoing.  Recommend continued use of 100% CPAP.

## 2021-02-12 NOTE — Progress Notes (Signed)
BP 104/70   Pulse 69   Ht 5\' 7"  (1.702 m)   Wt 260 lb 3.2 oz (118 kg)   LMP 02/09/2017 (Within Weeks)   SpO2 97%   BMI 40.75 kg/m    Subjective:    Patient ID: Cathy Mitchell, female    DOB: Jul 29, 1965, 55 y.o.   MRN: [id number]  HPI: Cathy Mitchell is a 55 y.o. female  Chief Complaint  Patient presents with   Weight Check    Patient is here for weight check and follow up.    [facility] Seeing neurology for tremor and fibromyalgia with MRI performed on 12/11/20 noting chronic small vessel disease.  Had follow-up with neurology on 02/03/21 -- she did attend PT for tremor.  Neurology offered referral to [facility] for further assessment -- she does not recall this conversation, but would like to pursue this.  She is taking Gabapentin 300 MG at bedtime -- which she reports is not helping leg seizing.  Reports her ankles are on fire and feels she needs to go to foot doctor, who she previously saw at [facility].  Recently seen by psychology at [facility] in [place] for testing -- the report sent with patient noted some mild cognitive changes.  Patient reports papers were sent to neurology for review. Pain status: stable Satisfied with current treatment?: no Medication side effects: no Medication compliance: good compliance Duration:  Location:  Quality: dull, aching, and burning Current pain level: mild Previous pain level: mild Aggravating factors: lifting and movement Alleviating factors: Gabapentin Previous pain specialty evaluation: no Non-narcotic analgesic meds: no Narcotic contract:no Treatments attempted: Gabapentin and PT  DEPRESSION/ANXIETY/STRESS Followed by Dr. Nicolasa Ducking -- recent visit (Monday) with medication changes made -- Celexa to 40 MG.  Taking Celexa 40 MG, Duloxetine 30 MG BID, and Trazodone 200 MG at bedtime (she is decreasing this due to concerns for dental problems, took 100 MG last night) -- Seroquel stopped recent visits.     Taking Folic acid to help with fibromyalgia fog.  Working with therapist -- Doreene Burke, via Dr. Nicolasa Ducking office as well -- they are working on causes of anxiety.  She reports the medications do not work 100%.  Wears CPAP at home, but often ends up off her face in middle of night.  She continues on Ozempic for weight loss -- currently taking 1 MG weekly and tolerating well.  She is currently down 24 pounds since May 2022.  Continues twice a month B12 injections in office due to low levels + Vitamin D every 3 days.  To contact Fransisca Connors PA-C with ortho at [facility] when she reaches 255 lbs.   Duration: stable Anxious mood: yes  Excessive worrying: yes Irritability: yes  Sweating: no Nausea: no Palpitations:no Hyperventilation: no Panic attacks: no Agoraphobia: no  Obscessions/compulsions: no Depressed mood: yes Depression screen [facility] 2/9 02/12/2021 12/18/2020 10/23/2020 09/10/2020 08/07/2020  Decreased Interest 3 1 1 2 3   Down, Depressed, Hopeless 3 1 1 2 3   PHQ - 2 Score 6 2 2 4 6   Altered sleeping 3 1 2 2 3   Tired, decreased energy 3 1 1 2 3   Change in appetite 0 1 1 3 3   Feeling bad or failure about yourself  3 0 0 2 0  Trouble concentrating 3 0 0 1 3  Moving slowly or fidgety/restless 3 0 0 0 3  Suicidal thoughts 0 0 0 0 0  PHQ-9 Score 21 5 6 14 21   Difficult doing  work/chores - Not difficult at all Somewhat difficult Somewhat difficult -  Some recent data might be hidden   Anhedonia: no Weight changes: no Insomnia: yes hard to stay asleep  Hypersomnia: no Fatigue/loss of energy: yes Feelings of worthlessness: yes Feelings of guilt: yes Impaired concentration/indecisiveness: yes Suicidal ideations: no  Crying spells: yes Recent Stressors/Life Changes: yes   Relationship problems: no   Family stress: yes     Financial stress: no    Job stress: no    Recent death/loss: no GAD 7 : Generalized Anxiety Score 02/12/2021 12/18/2020 10/23/2020 09/10/2020  Nervous, Anxious, on Edge 3 1  2 2   Control/stop worrying 3 1 2 3   Worry too much - different things 3 1 1 3   Trouble relaxing 3 1 1 2   Restless 3 0 1 1  Easily annoyed or irritable 1 0 1 2  Afraid - awful might happen 1 1 1 1   Total GAD 7 Score 17 5 9 14   Anxiety Difficulty Extremely difficult Not difficult at all Somewhat difficult Somewhat difficult   Relevant past medical, surgical, family and social history reviewed and updated as indicated. Interim medical history since our last visit reviewed. Allergies and medications reviewed and updated.  Review of Systems  Constitutional:  Negative for activity change, appetite change, diaphoresis, fatigue and unexpected weight change.  Respiratory: Negative.    Cardiovascular: Negative.   Gastrointestinal: Negative.   Neurological: Negative.   Psychiatric/Behavioral:  Positive for decreased concentration and sleep disturbance. Negative for self-injury and suicidal ideas. The patient is nervous/anxious.    Per HPI unless specifically indicated above     Objective:    BP 104/70   Pulse 69   Ht 5\' 7"  (1.702 m)   Wt 260 lb 3.2 oz (118 kg)   LMP 02/09/2017 (Within Weeks)   SpO2 97%   BMI 40.75 kg/m   Wt Readings from Last 3 Encounters:  02/12/21 260 lb 3.2 oz (118 kg)  01/09/21 270 lb (122.5 kg)  12/18/20 275 lb 12.8 oz (125.1 kg)    Physical Exam Vitals and nursing note reviewed.  Constitutional:      General: She is awake. She is not in acute distress.    Appearance: She is well-developed and well-groomed. She is morbidly obese. She is not ill-appearing.  HENT:     Head: Normocephalic.     Right Ear: Hearing normal.     Left Ear: Hearing normal.  Eyes:     General: Lids are normal.        Right eye: No discharge.        Left eye: No discharge.     Conjunctiva/sclera: Conjunctivae normal.     Pupils: Pupils are equal, round, and reactive to light.  Neck:     Thyroid: No thyromegaly.     Vascular: No carotid bruit or JVD.  Cardiovascular:     Rate  and Rhythm: Normal rate and regular rhythm.     Heart sounds: Normal heart sounds. No murmur heard.   No gallop.  Pulmonary:     Effort: Pulmonary effort is normal. No accessory muscle usage or respiratory distress.     Breath sounds: Normal breath sounds.  Abdominal:     General: Bowel sounds are normal.     Palpations: Abdomen is soft.  Musculoskeletal:     Cervical back: Normal range of motion and neck supple.     Right lower leg: No edema.     Left lower leg: No edema.  Lymphadenopathy:     Cervical: No cervical adenopathy.  Skin:    General: Skin is warm and dry.  Neurological:     Mental Status: She is alert and oriented to person, place, and time.     Motor: Tremor present.     Gait: Gait is intact.     Deep Tendon Reflexes:     Reflex Scores:      Brachioradialis reflexes are 1+ on the right side and 1+ on the left side.      Patellar reflexes are 1+ on the right side and 1+ on the left side.    Comments: Bilateral upper extremity tremor L>R -- varies from resting to active tremor.  No rigidity noted or cogwheel.  Normal gait.  No flat affect.  Psychiatric:        Attention and Perception: Attention normal.        Mood and Affect: Mood normal.        Speech: Speech normal.        Behavior: Behavior normal. Behavior is cooperative.        Thought Content: Thought content normal.   Results for orders placed or performed in visit on 08/14/20  CBC and differential  Result Value Ref Range   Hemoglobin 13.1 12.0 - 16.0   HCT 41 36 - 46   Platelets 373 150 - 399   WBC 7.1   CBC  Result Value Ref Range   RBC 4.75 3.87 - 5.11  VITAMIN D 25 Hydroxy (Vit-D Deficiency, Fractures)  Result Value Ref Range   Vit D, 25-Hydroxy 41.3   Basic metabolic panel  Result Value Ref Range   Glucose 92    BUN 7 4 - 21   CO2 25 (A) 13 - 22   Creatinine 0.8 0.5 - 1.1   Potassium 4.2 3.4 - 5.3   Sodium 138 137 - 147   Chloride 99 99 - 108  Lipid panel  Result Value Ref Range    LDl/HDL Ratio 1.3    Triglycerides 113 40 - 160   HDL 56 35 - 70   LDL Cholesterol 74   Hemoglobin A1c  Result Value Ref Range   Hemoglobin A1C 5.5       Assessment & Plan:   Problem List Items Addressed This Visit       Respiratory   Obstructive sleep apnea    Chronic, ongoing.  Recommend continued use of 100% CPAP.        Nervous and Auditory   Functional movement disorder    Followed by neurology at this time, continue this collaboration.  Recent note and MRI reviewed.  Will place referral to [facility] as was noted on recent neurology note.      Relevant Orders   Ambulatory referral to Neurology     Other   Fibromyalgia (Chronic)    Chronic, ongoing, followed by neurology and PT at this time.  Recent notes and MRI reviewed.  Will continue Gabapentin 300 MG daily.      Relevant Medications   citalopram (CELEXA) 40 MG tablet   B12 deficiency    Chronic, ongoing.  Continue injections of B12 in office, she obtains labs via [place] and sends to provider.      BMI 40.0-44.9, adult [facility])    Refer to obesity plan of care.      Depression - Primary    Chronic, ongoing, currently being followed by psychiatry and therapy.  Denies SI/HI.  Continue  current medication regimen as prescribed by them.  Discussed at length with her today.  She is to report all side effect concerns to them.  Return in 6 weeks.      Relevant Medications   citalopram (CELEXA) 40 MG tablet   Mild cognitive impairment    Noted via psychological testing with [facility] in [place] in October 2022.  She is following with psychiatry and neurology at this time, recent MRI performed.      Obesity    BMI 40.75, praised for 24 pound weight loss -- almost at goal for knee surgery, to get to 255 lbs. Recommended eating smaller high protein, low fat meals more frequently and exercising 30 mins a day 5 times a week with a goal of 10-15lb weight loss in the next 3 months. Patient  voiced their understanding and motivation to adhere to these recommendations.       Situational anxiety    Chronic, ongoing, currently being followed by psychiatry and therapy.  Denies SI/HI.  Continue current medication regimen as prescribed by them.  Discussed at length with her today.  Return in 6 weeks.      Relevant Medications   citalopram (CELEXA) 40 MG tablet   Vitamin D deficiency    Chronic, ongoing, continue supplement.  She obtains labs via [place] and sends to provider.       Other Visit Diagnoses     Foot pain, bilateral       Referral to podiatry placed per patient request.   Relevant Orders   Ambulatory referral to Podiatry       Time: 25 minutes, >50% spent counseling/or care coordination   Follow up plan: Return in about 6 weeks (around 03/26/2021) for Weight and Mood.

## 2021-02-12 NOTE — Assessment & Plan Note (Signed)
Noted via psychological testing with [facility] in [place] in October 2022.  She is following with psychiatry and neurology at this time, recent MRI performed.

## 2021-02-12 NOTE — Assessment & Plan Note (Addendum)
BMI 40.75, praised for 24 pound weight loss -- almost at goal for knee surgery, to get to 255 lbs. Recommended eating smaller high protein, low fat meals more frequently and exercising 30 mins a day 5 times a week with a goal of 10-15lb weight loss in the next 3 months. Patient voiced their understanding and motivation to adhere to these recommendations.

## 2021-02-12 NOTE — Assessment & Plan Note (Signed)
Refer to obesity plan of care. 

## 2021-02-12 NOTE — Assessment & Plan Note (Signed)
Chronic, ongoing, currently being followed by psychiatry and therapy.  Denies SI/HI.  Continue current medication regimen as prescribed by them.  Discussed at length with her today.  She is to report all side effect concerns to them.  Return in 6 weeks.

## 2021-02-12 NOTE — Assessment & Plan Note (Signed)
Chronic, ongoing, continue supplement.  She obtains labs via [place] and sends to provider.

## 2021-02-12 NOTE — Assessment & Plan Note (Signed)
Chronic, ongoing.  Continue injections of B12 in office, she obtains labs via [facility] and sends to provider.   

## 2021-02-12 NOTE — Assessment & Plan Note (Signed)
Chronic, ongoing, followed by neurology and PT at this time.  Recent notes and MRI reviewed.  Will continue Gabapentin 300 MG daily.

## 2021-02-12 NOTE — Patient Instructions (Signed)

## 2021-02-19 ENCOUNTER — Other Ambulatory Visit: Payer: Self-pay

## 2021-02-19 ENCOUNTER — Ambulatory Visit (INDEPENDENT_AMBULATORY_CARE_PROVIDER_SITE_OTHER): Payer: BC Managed Care – PPO

## 2021-02-19 DIAGNOSIS — E538 Deficiency of other specified B group vitamins: Secondary | ICD-10-CM | POA: Diagnosis not present

## 2021-02-19 MED ORDER — CYANOCOBALAMIN 1000 MCG/ML IJ SOLN
1000.0000 ug | Freq: Once | INTRAMUSCULAR | Status: AC
  Administered 2021-02-19: 1000 ug via INTRAMUSCULAR

## 2021-02-24 ENCOUNTER — Ambulatory Visit: Payer: Self-pay

## 2021-02-24 NOTE — Telephone Encounter (Signed)
  Pt. Started having dizziness 2 days ago. States "this happens when I have an ear infection." No other symptoms. Appointment made for tomorrow.    Reason for Disposition  [1] MODERATE dizziness (e.g., interferes with normal activities) AND [2] has been evaluated by physician for this  Answer Assessment - Initial Assessment Questions 1. DESCRIPTION: "Describe your dizziness."     Dizzy 2. LIGHTHEADED: "Do you feel lightheaded?" (e.g., somewhat faint, woozy, weak upon standing)     Yes 3. VERTIGO: "Do you feel like either you or the room is spinning or tilting?" (i.e. vertigo)     Yes 4. SEVERITY: "How bad is it?"  "Do you feel like you are going to faint?" "Can you stand and walk?"   - MILD: Feels slightly dizzy, but walking normally.   - MODERATE: Feels unsteady when walking, but not falling; interferes with normal activities (e.g., school, work).   - SEVERE: Unable to walk without falling, or requires assistance to walk without falling; feels like passing out now.      Moderate 5. ONSET:  "When did the dizziness begin?"     2 days 6. AGGRAVATING FACTORS: "Does anything make it worse?" (e.g., standing, change in head position)     Movement 7. HEART RATE: "Can you tell me your heart rate?" "How many beats in 15 seconds?"  (Note: not all patients can do this)       No 8. CAUSE: "What do you think is causing the dizziness?"     Ear infection 9. RECURRENT SYMPTOM: "Have you had dizziness before?" If Yes, ask: "When was the last time?" "What happened that time?"     Yes 10. OTHER SYMPTOMS: "Do you have any other symptoms?" (e.g., fever, chest pain, vomiting, diarrhea, bleeding)       Nausea 11. PREGNANCY: "Is there any chance you are pregnant?" "When was your last menstrual period?"       N/a  Protocols used: Dizziness - Lightheadedness-A-AH

## 2021-02-25 ENCOUNTER — Ambulatory Visit: Payer: BC Managed Care – PPO | Admitting: Nurse Practitioner

## 2021-02-25 ENCOUNTER — Other Ambulatory Visit: Payer: Self-pay

## 2021-02-25 ENCOUNTER — Encounter: Payer: Self-pay | Admitting: Nurse Practitioner

## 2021-02-25 VITALS — BP 130/84 | HR 79 | Temp 98.4°F | Wt 260.0 lb

## 2021-02-25 DIAGNOSIS — M19072 Primary osteoarthritis, left ankle and foot: Secondary | ICD-10-CM | POA: Diagnosis not present

## 2021-02-25 DIAGNOSIS — H6690 Otitis media, unspecified, unspecified ear: Secondary | ICD-10-CM | POA: Diagnosis not present

## 2021-02-25 DIAGNOSIS — M19071 Primary osteoarthritis, right ankle and foot: Secondary | ICD-10-CM | POA: Diagnosis not present

## 2021-02-25 DIAGNOSIS — M2141 Flat foot [pes planus] (acquired), right foot: Secondary | ICD-10-CM | POA: Diagnosis not present

## 2021-02-25 DIAGNOSIS — M2142 Flat foot [pes planus] (acquired), left foot: Secondary | ICD-10-CM | POA: Diagnosis not present

## 2021-02-25 MED ORDER — AMOXICILLIN 875 MG PO TABS: 875.0000 mg | ORAL_TABLET | Freq: Two times a day (BID) | ORAL | 0 refills | Status: AC

## 2021-02-25 NOTE — Progress Notes (Signed)
Acute Office Visit  Subjective:    Patient ID: Cathy Mitchell, female    DOB: September 14, 1965, 55 y.o.   MRN: [id number]  Chief Complaint  Patient presents with   Ear Pain    Pt states she has been having bilateral ear pain and balance issues since last Thursday     HPI Patient is in today for bilateral ear pain and balance issues since last Thursday.  EAR PAIN  Duration: days Involved ear(s): bilateral Severity:  mild  Quality:  feels like her heart beat in her ear Fever: no Otorrhea: yes Upper respiratory infection symptoms: no Pruritus: no Hearing loss: no Water immersion no Using Q-tips: yes Recurrent otitis media: no Status: worse Treatments attempted:  took 1 dose of amoxicillin last night   Past Medical History:  Diagnosis Date   Anxiety    Arthritis    Back pain    Dementia ([place])    Depression    Fibromyalgia    Food allergy    [place]   Hypertension    no longer has hypertension   Joint pain    Kidney problem    Neuromuscular disorder (HCC)    L-sided tremors   Osteoarthritis    Other fatigue    Rheumatoid arthritis (HCC)    Sleep apnea    does not use C-PAP    Past Surgical History:  Procedure Laterality Date   CARPAL TUNNEL RELEASE Bilateral 2007   DILATION AND CURETTAGE OF UTERUS     GANGLION CYST EXCISION Bilateral    GANGLION CYST EXCISION Right 10/08/2015   Procedure: REMOVAL GANGLION OF WRIST;  Surgeon: Hessie Knows, MD;  Location: [facility];  Service: Orthopedics;  Laterality: Right;   GASTRIC BYPASS     HAND SURGERY Bilateral Resection   HYSTEROSCOPY WITH D & C N/A 05/24/2017   Procedure: DILATATION AND CURETTAGE /HYSTEROSCOPY;  Surgeon: Defrancesco, Alanda Slim, MD;  Location: [facility];  Service: Gynecology;  Laterality: N/A;   INCONTINENCE SURGERY  2009   LEEP N/A 05/24/2017   Procedure: LOOP ELECTROSURGICAL EXCISION PROCEDURE (LEEP);  Surgeon: Brayton Mars, MD;  Location: [facility];  Service: Gynecology;  Laterality: N/A;   TUBAL  LIGATION      Family History  Problem Relation Age of Onset   COPD Father    Hypertension Father    Depression Father    Anxiety disorder Father    Obesity Father    Hypertension Mother    Depression Mother    Anxiety disorder Mother    Obesity Mother    Thyroid disease Daughter    Eczema Son    Heart disease Maternal Grandfather    Diabetes Paternal Grandfather    Heart disease Paternal Grandfather     Social History   Socioeconomic History   Marital status: Married    Spouse name: Jeneen Rinks   Number of children: 2   Years of education: Not on file   Highest education level: Not on file  Occupational History   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  Tobacco Use   Smoking status: Former    Packs/day: 0.50    Years: 29.00    Pack years: 14.50    Types: Cigarettes    Quit date: 01/29/2015    Years since quitting: 6.0   Smokeless tobacco: Never  Vaping Use   Vaping Use: Never used  Substance and Sexual Activity   Alcohol use: No    Alcohol/week: 0.0 standard drinks   Drug use: No  Sexual activity: Yes  Other Topics Concern   Not on file  Social History Narrative   Not on file   Social Determinants of Health   Financial Resource Strain: Not on file  Food Insecurity: Not on file  Transportation Needs: Not on file  Physical Activity: Not on file  Stress: Not on file  Social Connections: Not on file  Intimate Partner Violence: Not on file    Outpatient Medications Prior to Visit  Medication Sig Dispense Refill   acetaminophen (TYLENOL) 500 MG tablet Take 1,500 mg by mouth daily as needed for mild pain.     celecoxib (CELEBREX) 100 MG capsule TAKE 1 CAPSULE BY MOUTH EVERY DAY AS NEEDED FOR PAIN 30 capsule 3   citalopram (CELEXA) 40 MG tablet Take 40 mg by mouth daily.     dicyclomine (BENTYL) 20 MG tablet TAKE 1 TABLET BY MOUTH THREE TIMES A DAY 270 tablet 1   diltiazem (CARDIZEM CD) 240 MG 24 hr capsule Take 1 capsule (240 mg total) by mouth daily. 90 capsule 2    DULoxetine (CYMBALTA) 30 MG capsule Take 30 mg by mouth 2 (two) times daily.     folic acid (FOLVITE) 628 MCG tablet Take 800 mcg by mouth daily.     gabapentin (NEURONTIN) 100 MG capsule Take 3 capsules (300 mg total) by mouth at bedtime. 270 capsule 4   Insulin Pen Needle (NOVOFINE) 30G X 8 MM MISC To use to inject Ozempic into skin weekly 100 each 4   meclizine (ANTIVERT) 25 MG tablet Take 1 tablet (25 mg total) by mouth 3 (three) times daily as needed for dizziness. 30 tablet 0   Olopatadine HCl (PATADAY) 0.2 % SOLN Apply 1 drop to eye daily. Apply one drop to each eye daily as needed for dry and itchy eyes. 2.5 mL 5   Semaglutide, 1 MG/DOSE, (OZEMPIC, 1 MG/DOSE,) 4 MG/3ML SOPN Inject 1 mg into the skin once a week. 9 mL 4   traZODone (DESYREL) 100 MG tablet Take 200 mg by mouth at bedtime.     Vitamin D, Ergocalciferol, (DRISDOL) 1.25 MG (50000 UNIT) CAPS capsule Take 1 capsule (50,000 Units total) by mouth every 3 (three) days. 10 capsule 0   Facility-Administered Medications Prior to Visit  Medication Dose Route Frequency Provider Last Rate Last Admin   cyanocobalamin ((VITAMIN B-12)) injection 1,000 mcg  1,000 mcg Intramuscular Q30 days Marnee Guarneri T, NP   1,000 mcg at 02/06/21 3151    Allergies  Allergen Reactions   Oxycodone-Acetaminophen Itching and Other (See Comments)    Reaction:  Dizziness and blurred vision    Other Other (See Comments)    Patient states she is allergic to all narcotics   Percocet [Oxycodone-Acetaminophen] Nausea And Vomiting and Other (See Comments)    Reaction:  Dizziness and blurred vision  Reaction:  Dizziness and blurred vision     Review of Systems  Constitutional:  Positive for fatigue. Negative for fever.  HENT:  Positive for ear pain. Negative for congestion, postnasal drip and sore throat.   Respiratory: Negative.    Cardiovascular: Negative.   Neurological:  Positive for dizziness. Negative for headaches.      Objective:    Physical  Exam Vitals and nursing note reviewed.  Constitutional:      General: She is not in acute distress.    Appearance: Normal appearance.  HENT:     Head: Normocephalic.     Right Ear: Tympanic membrane, ear canal and external ear  normal.     Left Ear: Ear canal and external ear normal. Tympanic membrane is erythematous.  Eyes:     Conjunctiva/sclera: Conjunctivae normal.  Cardiovascular:     Rate and Rhythm: Normal rate and regular rhythm.     Pulses: Normal pulses.     Heart sounds: Normal heart sounds.  Pulmonary:     Effort: Pulmonary effort is normal.     Breath sounds: Normal breath sounds.  Musculoskeletal:     Cervical back: Normal range of motion.  Skin:    General: Skin is warm.  Neurological:     General: No focal deficit present.     Mental Status: She is alert and oriented to person, place, and time.  Psychiatric:        Mood and Affect: Mood normal.        Behavior: Behavior normal.        Thought Content: Thought content normal.        Judgment: Judgment normal.    BP 130/84   Pulse 79   Temp 98.4 F (36.9 C) (Oral)   Wt 260 lb (117.9 kg)   LMP 02/09/2017 (Within Weeks)   SpO2 96%   BMI 40.72 kg/m  Wt Readings from Last 3 Encounters:  02/25/21 260 lb (117.9 kg)  02/12/21 260 lb 3.2 oz (118 kg)  01/09/21 270 lb (122.5 kg)    Health Maintenance Due  Topic Date Due   PAP SMEAR-Modifier  05/05/2020   TETANUS/TDAP  12/28/2020    There are no preventive care reminders to display for this patient.   Lab Results  Component Value Date   TSH 0.91 03/05/2020   Lab Results  Component Value Date   WBC 7.1 07/23/2020   HGB 13.1 07/23/2020   HCT 41 07/23/2020   MCV 88.6 03/08/2018   PLT 373 07/23/2020   Lab Results  Component Value Date   NA 138 07/23/2020   K 4.2 07/23/2020   CO2 25 (A) 07/23/2020   GLUCOSE 97 03/08/2018   BUN 7 07/23/2020   CREATININE 0.8 07/23/2020   BILITOT 0.5 03/08/2018   ALKPHOS 107 03/05/2020   AST 12 (A) 03/05/2020    ALT 13 03/05/2020   PROT 7.4 03/08/2018   ALBUMIN 4.1 03/08/2018   CALCIUM 9.3 03/05/2020   ANIONGAP 7 03/08/2018   Lab Results  Component Value Date   CHOL 165 03/05/2020   Lab Results  Component Value Date   HDL 56 07/23/2020   Lab Results  Component Value Date   LDLCALC 74 07/23/2020   Lab Results  Component Value Date   TRIG 113 07/23/2020   Lab Results  Component Value Date   CHOLHDL 4.3 12/03/2014   Lab Results  Component Value Date   HGBA1C 5.5 07/23/2020       Assessment & Plan:   Problem List Items Addressed This Visit   None Visit Diagnoses     Acute otitis media, unspecified otitis media type    -  Primary   Will treat with amoxicillin x7 days. Can take ibuprofen or tylenol prn pain. F/U if symptoms not improving.    Relevant Medications   amoxicillin (AMOXIL) 875 MG tablet        Meds ordered this encounter  Medications   amoxicillin (AMOXIL) 875 MG tablet    Sig: Take 1 tablet (875 mg total) by mouth 2 (two) times daily for 7 days.    Dispense:  14 tablet    Refill:  0  Charyl Dancer, NP

## 2021-02-26 DIAGNOSIS — M62838 Other muscle spasm: Secondary | ICD-10-CM | POA: Diagnosis not present

## 2021-02-26 DIAGNOSIS — G259 Extrapyramidal and movement disorder, unspecified: Secondary | ICD-10-CM | POA: Diagnosis not present

## 2021-03-04 DIAGNOSIS — Z0189 Encounter for other specified special examinations: Secondary | ICD-10-CM | POA: Diagnosis not present

## 2021-03-07 ENCOUNTER — Encounter: Payer: Self-pay | Admitting: Nurse Practitioner

## 2021-03-07 DIAGNOSIS — Z043 Encounter for examination and observation following other accident: Secondary | ICD-10-CM | POA: Diagnosis not present

## 2021-03-07 DIAGNOSIS — E559 Vitamin D deficiency, unspecified: Secondary | ICD-10-CM

## 2021-03-07 DIAGNOSIS — Z713 Dietary counseling and surveillance: Secondary | ICD-10-CM | POA: Diagnosis not present

## 2021-03-14 ENCOUNTER — Other Ambulatory Visit: Payer: Self-pay

## 2021-03-14 ENCOUNTER — Ambulatory Visit (INDEPENDENT_AMBULATORY_CARE_PROVIDER_SITE_OTHER): Payer: BC Managed Care – PPO

## 2021-03-14 DIAGNOSIS — E538 Deficiency of other specified B group vitamins: Secondary | ICD-10-CM

## 2021-03-14 DIAGNOSIS — F411 Generalized anxiety disorder: Secondary | ICD-10-CM | POA: Diagnosis not present

## 2021-03-14 DIAGNOSIS — F5105 Insomnia due to other mental disorder: Secondary | ICD-10-CM | POA: Diagnosis not present

## 2021-03-14 DIAGNOSIS — F331 Major depressive disorder, recurrent, moderate: Secondary | ICD-10-CM | POA: Diagnosis not present

## 2021-03-14 DIAGNOSIS — F4312 Post-traumatic stress disorder, chronic: Secondary | ICD-10-CM | POA: Diagnosis not present

## 2021-03-28 ENCOUNTER — Ambulatory Visit: Payer: BC Managed Care – PPO | Admitting: Nurse Practitioner

## 2021-03-28 ENCOUNTER — Other Ambulatory Visit: Payer: Self-pay | Admitting: Cardiovascular Disease

## 2021-03-28 ENCOUNTER — Other Ambulatory Visit: Payer: Self-pay

## 2021-03-28 ENCOUNTER — Encounter: Payer: Self-pay | Admitting: Nurse Practitioner

## 2021-03-28 VITALS — BP 112/82 | HR 88 | Temp 98.4°F | Wt 257.8 lb

## 2021-03-28 DIAGNOSIS — F411 Generalized anxiety disorder: Secondary | ICD-10-CM | POA: Diagnosis not present

## 2021-03-28 DIAGNOSIS — F418 Other specified anxiety disorders: Secondary | ICD-10-CM

## 2021-03-28 DIAGNOSIS — Z23 Encounter for immunization: Secondary | ICD-10-CM

## 2021-03-28 DIAGNOSIS — G259 Extrapyramidal and movement disorder, unspecified: Secondary | ICD-10-CM | POA: Diagnosis not present

## 2021-03-28 DIAGNOSIS — F900 Attention-deficit hyperactivity disorder, predominantly inattentive type: Secondary | ICD-10-CM | POA: Diagnosis not present

## 2021-03-28 DIAGNOSIS — M797 Fibromyalgia: Secondary | ICD-10-CM

## 2021-03-28 DIAGNOSIS — Z1211 Encounter for screening for malignant neoplasm of colon: Secondary | ICD-10-CM

## 2021-03-28 DIAGNOSIS — F5105 Insomnia due to other mental disorder: Secondary | ICD-10-CM | POA: Diagnosis not present

## 2021-03-28 DIAGNOSIS — E559 Vitamin D deficiency, unspecified: Secondary | ICD-10-CM

## 2021-03-28 DIAGNOSIS — G4733 Obstructive sleep apnea (adult) (pediatric): Secondary | ICD-10-CM

## 2021-03-28 DIAGNOSIS — E538 Deficiency of other specified B group vitamins: Secondary | ICD-10-CM | POA: Diagnosis not present

## 2021-03-28 DIAGNOSIS — Z1159 Encounter for screening for other viral diseases: Secondary | ICD-10-CM | POA: Diagnosis not present

## 2021-03-28 DIAGNOSIS — F4312 Post-traumatic stress disorder, chronic: Secondary | ICD-10-CM | POA: Diagnosis not present

## 2021-03-28 DIAGNOSIS — F331 Major depressive disorder, recurrent, moderate: Secondary | ICD-10-CM

## 2021-03-28 DIAGNOSIS — Z6841 Body Mass Index (BMI) 40.0 and over, adult: Secondary | ICD-10-CM

## 2021-03-28 MED ORDER — CYANOCOBALAMIN 1000 MCG/ML IJ SOLN
1000.0000 ug | Freq: Once | INTRAMUSCULAR | Status: AC
  Administered 2021-03-28: 1000 ug via INTRAMUSCULAR

## 2021-03-28 NOTE — Assessment & Plan Note (Signed)
Chronic, ongoing, followed by neurology and PT at this time.  Recent notes and MRI reviewed.  Will continue Gabapentin 300 MG daily.  Check ANA, CRP, ESR today.

## 2021-03-28 NOTE — Assessment & Plan Note (Signed)
Chronic, ongoing, currently being followed by psychiatry and therapy.  Denies SI/HI.  Continue current medication regimen as prescribed by them.  Discussed at length with her today.  Return in 6 weeks.

## 2021-03-28 NOTE — Assessment & Plan Note (Signed)
Chronic, ongoing.  Continue injections of B12 in office, she obtains labs via [place] and sends to provider.  Recent level 444.

## 2021-03-28 NOTE — Assessment & Plan Note (Signed)
Chronic, ongoing.  Recommend continued use of 100% CPAP.

## 2021-03-28 NOTE — Assessment & Plan Note (Signed)
Followed by Dr. Nicolasa Ducking with psychiatry -- currently on Adderall XL - recently started.

## 2021-03-28 NOTE — Assessment & Plan Note (Addendum)
Followed by [facility] at this time, continue this collaboration.  Recent notes and MRI reviewed. She is to start therapy via a [facility] referral.  Check ANA, CRP, ESR.

## 2021-03-28 NOTE — Assessment & Plan Note (Signed)
BMI 40.38, praised for 34 pound weight loss -- almost at goal for knee surgery, to get to 255 lbs (has 2 more pounds to go). Recommended eating smaller high protein, low fat meals more frequently and exercising 30 mins a day 5 times a week with a goal of 10-15lb weight loss in the next 3 months. Patient voiced their understanding and motivation to adhere to these recommendations.

## 2021-03-28 NOTE — Assessment & Plan Note (Addendum)
Chronic, ongoing, continue supplement.  She obtains labs via [place] and sends to provider.  Recent lab 62.3.

## 2021-03-28 NOTE — Patient Instructions (Signed)
Healthy Eating °Following a healthy eating pattern may help you to achieve and maintain a healthy body weight, reduce the risk of chronic disease, and live a long and productive life. It is important to follow a healthy eating pattern at an appropriate calorie level for your body. Your nutritional needs should be met primarily through food by choosing a variety of nutrient-rich foods. °What are tips for following this plan? °Reading food labels °Read labels and choose the following: °Reduced or low sodium. °Juices with 100% fruit juice. °Foods with low saturated fats and high polyunsaturated and monounsaturated fats. °Foods with whole grains, such as whole wheat, cracked wheat, brown rice, and wild rice. °Whole grains that are fortified with folic acid. This is recommended for women who are pregnant or who want to become pregnant. °Read labels and avoid the following: °Foods with a lot of added sugars. These include foods that contain brown sugar, corn sweetener, corn syrup, dextrose, fructose, glucose, high-fructose corn syrup, honey, invert sugar, lactose, malt syrup, maltose, molasses, raw sugar, sucrose, trehalose, or turbinado sugar. °Do not eat more than the following amounts of added sugar per day: °6 teaspoons (25 g) for women. °9 teaspoons (38 g) for men. °Foods that contain processed or refined starches and grains. °Refined grain products, such as white flour, degermed cornmeal, white bread, and white rice. °Shopping °Choose nutrient-rich snacks, such as vegetables, whole fruits, and nuts. Avoid high-calorie and high-sugar snacks, such as potato chips, fruit snacks, and candy. °Use oil-based dressings and spreads on foods instead of solid fats such as butter, stick margarine, or cream cheese. °Limit pre-made sauces, mixes, and "instant" products such as flavored rice, instant noodles, and ready-made pasta. °Try more plant-protein sources, such as tofu, tempeh, black beans, edamame, lentils, nuts, and  seeds. °Explore eating plans such as the Mediterranean diet or vegetarian diet. °Cooking °Use oil to sauté or stir-fry foods instead of solid fats such as butter, stick margarine, or lard. °Try baking, boiling, grilling, or broiling instead of frying. °Remove the fatty part of meats before cooking. °Steam vegetables in water or broth. °Meal planning ° °At meals, imagine dividing your plate into fourths: °One-half of your plate is fruits and vegetables. °One-fourth of your plate is whole grains. °One-fourth of your plate is protein, especially lean meats, poultry, eggs, tofu, beans, or nuts. °Include low-fat dairy as part of your daily diet. °Lifestyle °Choose healthy options in all settings, including home, work, school, restaurants, or stores. °Prepare your food safely: °Wash your hands after handling raw meats. °Keep food preparation surfaces clean by regularly washing with hot, soapy water. °Keep raw meats separate from ready-to-eat foods, such as fruits and vegetables. °Cook seafood, meat, poultry, and eggs to the recommended internal temperature. °Store foods at safe temperatures. In general: °Keep cold foods at 40°F (4.4°C) or below. °Keep hot foods at 140°F (60°C) or above. °Keep your freezer at 0°F (-17.8°C) or below. °Foods are no longer safe to eat when they have been between the temperatures of 40°-140°F (4.4-60°C) for more than 2 hours. °What foods should I eat? °Fruits °Aim to eat 2 cup-equivalents of fresh, canned (in natural juice), or frozen fruits each day. Examples of 1 cup-equivalent of fruit include 1 small apple, 8 large strawberries, 1 cup canned fruit, ½ cup dried fruit, or 1 cup 100% juice. °Vegetables °Aim to eat 2½-3 cup-equivalents of fresh and frozen vegetables each day, including different varieties and colors. Examples of 1 cup-equivalent of vegetables include 2 medium carrots, 2 cups raw,   leafy greens, 1 cup chopped vegetable (raw or cooked), or 1 medium baked potato. °Grains °Aim to  eat 6 ounce-equivalents of whole grains each day. Examples of 1 ounce-equivalent of grains include 1 slice of bread, 1 cup ready-to-eat cereal, 3 cups popcorn, or ½ cup cooked rice, pasta, or cereal. °Meats and other proteins °Aim to eat 5-6 ounce-equivalents of protein each day. Examples of 1 ounce-equivalent of protein include 1 egg, 1/2 cup nuts or seeds, or 1 tablespoon (16 g) peanut butter. A cut of meat or fish that is the size of a deck of cards is about 3-4 ounce-equivalents. °Of the protein you eat each week, try to have at least 8 ounces come from seafood. This includes salmon, trout, herring, and anchovies. °Dairy °Aim to eat 3 cup-equivalents of fat-free or low-fat dairy each day. Examples of 1 cup-equivalent of dairy include 1 cup (240 mL) milk, 8 ounces (250 g) yogurt, 1½ ounces (44 g) natural cheese, or 1 cup (240 mL) fortified soy milk. °Fats and oils °Aim for about 5 teaspoons (21 g) per day. Choose monounsaturated fats, such as canola and olive oils, avocados, peanut butter, and most nuts, or polyunsaturated fats, such as sunflower, corn, and soybean oils, walnuts, pine nuts, sesame seeds, sunflower seeds, and flaxseed. °Beverages °Aim for six 8-oz glasses of water per day. Limit coffee to three to five 8-oz cups per day. °Limit caffeinated beverages that have added calories, such as soda and energy drinks. °Limit alcohol intake to no more than 1 drink a day for nonpregnant women and 2 drinks a day for men. One drink equals 12 oz of beer (355 mL), 5 oz of wine (148 mL), or 1½ oz of hard liquor (44 mL). °Seasoning and other foods °Avoid adding excess amounts of salt to your foods. Try flavoring foods with herbs and spices instead of salt. °Avoid adding sugar to foods. °Try using oil-based dressings, sauces, and spreads instead of solid fats. °This information is based on general [place] nutrition guidelines. For more information, visit choosemyplate.gov. Exact amounts may vary based on your nutrition  needs. °Summary °A healthy eating plan may help you to maintain a healthy weight, reduce the risk of chronic diseases, and stay active throughout your life. °Plan your meals. Make sure you eat the right portions of a variety of nutrient-rich foods. °Try baking, boiling, grilling, or broiling instead of frying. °Choose healthy options in all settings, including home, work, school, restaurants, or stores. °This information is not intended to replace advice given to you by your health care provider. Make sure you discuss any questions you have with your health care provider. °Document Revised: 11/12/2020 Document Reviewed: 11/12/2020 °Elsevier Patient Education © 2022 [employer] ° °

## 2021-03-28 NOTE — Assessment & Plan Note (Signed)
Chronic, ongoing, currently being followed by psychiatry and therapy.  Denies SI/HI.  Continue current medication regimen as prescribed by them.  Discussed at length with her today.  She is to report all side effect concerns to them.  Return in 6 weeks.

## 2021-03-28 NOTE — Progress Notes (Signed)
BP 112/82    Pulse 88    Temp 98.4 F (36.9 C) (Oral)    Wt 257 lb 12.8 oz (116.9 kg)    LMP 02/09/2017 (Within Weeks)    SpO2 98%    BMI 40.38 kg/m    Subjective:    Patient ID: Cathy Mitchell, female    DOB: 1965-10-14, 55 y.o.   MRN: [id number]  HPI: Cathy Mitchell is a 55 y.o. female  Chief Complaint  Patient presents with   Weight Check   Mood   FIBROMYALGIA/TREMOR Followed by [facility] neurology team for tremor and fibromyalgia with MRI performed on 12/11/20 noting chronic small vessel disease.  Had follow-up with neurology on 02/26/21-- she was referred for therapy and they recommended she come off of some of the psychiatric medications.  She is taking Gabapentin 300 MG at bedtime for leg tremors.    Recently seen by psychology at [facility] in [place] for testing -- the report sent with patient noted some mild cognitive changes.   Pain status: stable Satisfied with current treatment?: no Medication side effects: no Medication compliance: good compliance Duration:  Location:  Quality: dull, aching, and burning Current pain level: mild Previous pain level: mild Aggravating factors: lifting and movement Alleviating factors: Gabapentin Previous pain specialty evaluation: no Non-narcotic analgesic meds: no Narcotic contract:no Treatments attempted: Gabapentin and PT  DEPRESSION/ADHD/PTSD Followed by Dr. Nicolasa Ducking = recently saw today.  Taking Celexa 40 MG, Trazodone and Duloxetine discontinued.  She was started on Adderall XR 20 MG and continues Melatonin for sleep.  Tomorrow morning she is to start taking 40 MG Adderall to see if change, as right now not noticing change.  Taking Folic acid to help with fibromyalgia fog, current labs she brought in was >20.  Working with therapist -- Doreene Burke, via Dr. Nicolasa Ducking office as well -- for PTSD.  Wears CPAP at home, but often ends up off her face in middle of night.  She continues on Ozempic for weight loss -- currently taking 1 MG  weekly and tolerating well.  She is currently down 34 pounds since March 2022.  Continues twice a month B12 injections in office due to low levels (current level 444) + Vitamin D every 3 days.  To contact Fransisca Connors PA-C with ortho at [facility] when she reaches 255 lbs.   Duration: stable Anxious mood: yes  Excessive worrying: yes Irritability: yes  Sweating: no Nausea: no Palpitations:no Hyperventilation: no Panic attacks: no Agoraphobia: no  Obscessions/compulsions: no Depressed mood: yes Depression screen [facility] 2/9 02/12/2021 12/18/2020 10/23/2020 09/10/2020 08/07/2020  Decreased Interest _0 Down, Depressed, Hopeless _1 PHQ - 2 Score _2 Altered sleeping _3 Tired, decreased energy _4 Change in appetite 0 _5 Feeling bad or failure about yourself  3 0 0 2 0  Trouble concentrating 3 0 0 1 3  Moving slowly or fidgety/restless 3 0 0 0 3  Suicidal thoughts 0 0 0 0 0  PHQ-9 Score _6 Difficult doing work/chores - Not difficult at all Somewhat difficult Somewhat difficult -  Some recent data might be hidden  Anhedonia: no Weight changes: no Insomnia: yes hard to stay asleep  Hypersomnia: no Fatigue/loss of energy: yes Feelings of worthlessness: yes Feelings of guilt: yes Impaired concentration/indecisiveness: yes Suicidal ideations: no  Crying spells: yes  Recent Stressors/Life Changes: yes   Relationship problems: no   Family stress: yes     Financial stress: no    Job stress: no    Recent death/loss: no GAD 7 : Generalized Anxiety Score 02/12/2021 12/18/2020 10/23/2020 09/10/2020  Nervous, Anxious, on Edge _0 Control/stop worrying _1 Worry too much - different things _2 Trouble relaxing _3 Restless 3 0 1 1  Easily annoyed or irritable 1 0 1 2  Afraid - awful might happen _4 Total GAD 7 Score _5 Anxiety Difficulty Extremely difficult Not difficult at all Somewhat difficult Somewhat  difficult   Relevant past medical, surgical, family and social history reviewed and updated as indicated. Interim medical history since our last visit reviewed. Allergies and medications reviewed and updated.  Review of Systems  Constitutional:  Negative for activity change, appetite change, diaphoresis, fatigue and unexpected weight change.  Respiratory: Negative.    Cardiovascular: Negative.   Gastrointestinal: Negative.   Neurological: Negative.   Psychiatric/Behavioral:  Positive for decreased concentration and sleep disturbance. Negative for self-injury and suicidal ideas. The patient is nervous/anxious.    Per HPI unless specifically indicated above     Objective:    BP 112/82    Pulse 88    Temp 98.4 F (36.9 C) (Oral)    Wt 257 lb 12.8 oz (116.9 kg)    LMP 02/09/2017 (Within Weeks)    SpO2 98%    BMI 40.38 kg/m   Wt Readings from Last 3 Encounters:  03/28/21 257 lb 12.8 oz (116.9 kg)  02/25/21 260 lb (117.9 kg)  02/12/21 260 lb 3.2 oz (118 kg)    Physical Exam Vitals and nursing note reviewed.  Constitutional:      General: She is awake. She is not in acute distress.    Appearance: She is well-developed and well-groomed. She is morbidly obese. She is not ill-appearing.  HENT:     Head: Normocephalic.     Right Ear: Hearing normal.     Left Ear: Hearing normal.  Eyes:     General: Lids are normal.        Right eye: No discharge.        Left eye: No discharge.     Conjunctiva/sclera: Conjunctivae normal.     Pupils: Pupils are equal, round, and reactive to light.  Neck:     Thyroid: No thyromegaly.     Vascular: No carotid bruit or JVD.  Cardiovascular:     Rate and Rhythm: Normal rate and regular rhythm.     Heart sounds: Normal heart sounds. No murmur heard.   No gallop.  Pulmonary:     Effort: Pulmonary effort is normal. No accessory muscle usage or respiratory distress.     Breath sounds: Normal breath sounds.  Abdominal:     General: Bowel sounds are  normal.     Palpations: Abdomen is soft.  Musculoskeletal:     Cervical back: Normal range of motion and neck supple.     Right lower leg: No edema.     Left lower leg: No edema.  Lymphadenopathy:     Cervical: No cervical adenopathy.  Skin:    General: Skin is warm and dry.  Neurological:     Mental Status: She is alert and oriented to person, place, and time.     Motor: Tremor present.  Gait: Gait is intact.     Deep Tendon Reflexes:     Reflex Scores:      Brachioradialis reflexes are 1+ on the right side and 1+ on the left side.      Patellar reflexes are 1+ on the right side and 1+ on the left side.    Comments: Bilateral upper extremity tremor L>R -- varies from resting to active tremor.  No rigidity noted or cogwheel.  Normal gait.  No flat affect.  Psychiatric:        Attention and Perception: Attention normal.        Mood and Affect: Mood normal.        Speech: Speech normal.        Behavior: Behavior normal. Behavior is cooperative.        Thought Content: Thought content normal.   Results for orders placed or performed in visit on 08/14/20  CBC and differential  Result Value Ref Range   Hemoglobin 13.1 12.0 - 16.0   HCT 41 36 - 46   Platelets 373 150 - 399   WBC 7.1   CBC  Result Value Ref Range   RBC 4.75 3.87 - 5.11  VITAMIN D 25 Hydroxy (Vit-D Deficiency, Fractures)  Result Value Ref Range   Vit D, 25-Hydroxy 14.9   Basic metabolic panel  Result Value Ref Range   Glucose 92    BUN 7 4 - 21   CO2 25 (A) 13 - 22   Creatinine 0.8 0.5 - 1.1   Potassium 4.2 3.4 - 5.3   Sodium 138 137 - 147   Chloride 99 99 - 108  Lipid panel  Result Value Ref Range   LDl/HDL Ratio 1.3    Triglycerides 113 40 - 160   HDL 56 35 - 70   LDL Cholesterol 74   Hemoglobin A1c  Result Value Ref Range   Hemoglobin A1C 5.5       Assessment & Plan:   Problem List Items Addressed This Visit       Respiratory   Obstructive sleep apnea    Chronic, ongoing.  Recommend  continued use of 100% CPAP.        Nervous and Auditory   Functional movement disorder    Followed by [facility] neurology at this time, continue this collaboration.  Recent notes and MRI reviewed. She is to start therapy via a [facility] referral.  Check ANA, CRP, ESR.      Relevant Orders   ANA w/Reflex if Positive   C-reactive protein   Sed Rate (ESR)     Other   Fibromyalgia (Chronic)    Chronic, ongoing, followed by neurology and PT at this time.  Recent notes and MRI reviewed.  Will continue Gabapentin 300 MG daily.  Check ANA, CRP, ESR today.      Attention deficit hyperactivity disorder (ADHD), predominantly inattentive type    Followed by Dr. Nicolasa Ducking with psychiatry -- currently on Adderall XL - recently started.      B12 deficiency    Chronic, ongoing.  Continue injections of B12 in office, she obtains labs via [place] and sends to provider.  Recent level 444.      Depression - Primary    Chronic, ongoing, currently being followed by psychiatry and therapy.  Denies SI/HI.  Continue current medication regimen as prescribed by them.  Discussed at length with her today.  She is to report all side effect concerns to them.  Return in 6 weeks.  Obesity    BMI 40.38, praised for 34 pound weight loss -- almost at goal for knee surgery, to get to 255 lbs (has 2 more pounds to go). Recommended eating smaller high protein, low fat meals more frequently and exercising 30 mins a day 5 times a week with a goal of 10-15lb weight loss in the next 3 months. Patient voiced their understanding and motivation to adhere to these recommendations.       Relevant Medications   amphetamine-dextroamphetamine (ADDERALL XR) 20 MG 24 hr capsule   Situational anxiety    Chronic, ongoing, currently being followed by psychiatry and therapy.  Denies SI/HI.  Continue current medication regimen as prescribed by them.  Discussed at length with her today.  Return in 6 weeks.      Vitamin D deficiency     Chronic, ongoing, continue supplement.  She obtains labs via [place] and sends to provider.  Recent lab 62.3.       Other Visit Diagnoses     Need for Td vaccine       Td vaccine today in office.   Relevant Orders   Td vaccine greater than or equal to 7yo preservative free IM   Need for hepatitis C screening test       Hep C screening today on labs, discussed with patient.   Relevant Orders   Hepatitis C antibody   Colon cancer screening       Cologuard ordered.   Relevant Orders   Cologuard       Time: 25 minutes, >50% spent counseling/or care coordination  Follow up plan: Return in about 6 weeks (around 05/09/2021) for WEIGHT CHECK AND MOOD.

## 2021-03-29 LAB — C-REACTIVE PROTEIN: CRP: 2 mg/L (ref 0–10)

## 2021-03-29 LAB — ANA W/REFLEX IF POSITIVE: Anti Nuclear Antibody (ANA): NEGATIVE

## 2021-03-29 LAB — HEPATITIS C ANTIBODY: Hep C Virus Ab: <0.1 s/co ratio (ref 0.0–0.9)

## 2021-03-29 LAB — SEDIMENTATION RATE: Sed Rate: 21 mm/hr (ref 0–40)

## 2021-03-29 NOTE — Progress Notes (Signed)
Contacted via MyChart   ANA returned negative:) Good news!!

## 2021-03-29 NOTE — Progress Notes (Signed)
Contacted via MyChart   Good morning Cathy Mitchell, some of your labs have returned.  Inflammatory labs, ESR and CRP, are normal.  ANA not returned as of yet.  Hep C testing is negative.  I will alert you when ANA returns.  Have a Happy New Year!!  This is going to be a great year for you!! Keep being awesome!!  Thank you for allowing me to participate in your care.  I appreciate you. Kindest regards, Gurfateh Mcclain

## 2021-04-01 ENCOUNTER — Encounter: Payer: Self-pay | Admitting: Nurse Practitioner

## 2021-04-01 DIAGNOSIS — E559 Vitamin D deficiency, unspecified: Secondary | ICD-10-CM

## 2021-04-01 MED ORDER — OZEMPIC (1 MG/DOSE) 4 MG/3ML ~~LOC~~ SOPN: 1.0000 mg | PEN_INJECTOR | SUBCUTANEOUS | 4 refills | Status: DC

## 2021-04-01 MED ORDER — VITAMIN D (ERGOCALCIFEROL) 1.25 MG (50000 UNIT) PO CAPS: 50000.0000 [IU] | ORAL_CAPSULE | ORAL | 4 refills | Status: DC

## 2021-04-02 MED ORDER — VITAMIN D (ERGOCALCIFEROL) 1.25 MG (50000 UNIT) PO CAPS: 50000.0000 [IU] | ORAL_CAPSULE | ORAL | 4 refills | Status: DC

## 2021-04-04 ENCOUNTER — Telehealth: Payer: Self-pay | Admitting: Nurse Practitioner

## 2021-04-04 NOTE — Telephone Encounter (Signed)
Copied from [place] [id number]. Topic: General - Other >> Apr 04, 2021  4:40 PM Tessa Lerner A wrote: Reason for CRM: [place] with RX benefits and has called to provide the following instruction for submitting a prior authorization for Semaglutide, 1 MG/DOSE, (OZEMPIC, 1 MG/DOSE,) 4 MG/3ML SOPN [[id number]]   [email or web address]  [id number] is the reference id that can be used for this case

## 2021-04-07 ENCOUNTER — Telehealth: Payer: Self-pay

## 2021-04-07 DIAGNOSIS — R202 Paresthesia of skin: Secondary | ICD-10-CM | POA: Diagnosis not present

## 2021-04-07 DIAGNOSIS — R251 Tremor, unspecified: Secondary | ICD-10-CM | POA: Diagnosis not present

## 2021-04-07 DIAGNOSIS — G259 Extrapyramidal and movement disorder, unspecified: Secondary | ICD-10-CM | POA: Diagnosis not present

## 2021-04-07 DIAGNOSIS — M62838 Other muscle spasm: Secondary | ICD-10-CM | POA: Diagnosis not present

## 2021-04-07 NOTE — Telephone Encounter (Signed)
Patient spoke with office manager Alwyn Ren in regards to insurance/prior authorization. Alwyn Ren will reach out to patient with updates regarding recent prior authorization.

## 2021-04-07 NOTE — Telephone Encounter (Signed)
Sent message to patient via MyChart.  

## 2021-04-08 ENCOUNTER — Encounter: Payer: Self-pay | Admitting: Nurse Practitioner

## 2021-04-11 ENCOUNTER — Ambulatory Visit: Payer: BC Managed Care – PPO

## 2021-04-11 ENCOUNTER — Other Ambulatory Visit: Payer: Self-pay

## 2021-04-11 ENCOUNTER — Ambulatory Visit (INDEPENDENT_AMBULATORY_CARE_PROVIDER_SITE_OTHER): Payer: BC Managed Care – PPO

## 2021-04-11 ENCOUNTER — Encounter: Payer: Self-pay | Admitting: Nurse Practitioner

## 2021-04-11 VITALS — Ht 66.5 in | Wt 253.6 lb

## 2021-04-11 DIAGNOSIS — E538 Deficiency of other specified B group vitamins: Secondary | ICD-10-CM

## 2021-04-15 ENCOUNTER — Telehealth: Payer: Self-pay | Admitting: Nurse Practitioner

## 2021-04-15 DIAGNOSIS — Z1211 Encounter for screening for malignant neoplasm of colon: Secondary | ICD-10-CM | POA: Diagnosis not present

## 2021-04-15 NOTE — Telephone Encounter (Signed)
Called Cover My Meds. Let them know that PA is not needed as the medication is not covered.

## 2021-04-15 NOTE — Telephone Encounter (Signed)
Mindy from [place] [employer] needs the clinic to call and verify the patients PA Best contact: [phone] ([facility])  Regarding the patients Ozempic

## 2021-04-18 MED ORDER — WEGOVY 1 MG/0.5ML ~~LOC~~ SOAJ: 1.0000 mg | SUBCUTANEOUS | 4 refills | Status: DC

## 2021-04-19 ENCOUNTER — Other Ambulatory Visit: Payer: Self-pay | Admitting: Nurse Practitioner

## 2021-04-19 NOTE — Telephone Encounter (Signed)
Requested Prescriptions  Pending Prescriptions Disp Refills   celecoxib (CELEBREX) 100 MG capsule [Pharmacy Med Name: CELECOXIB 100 MG CAPSULE] 30 capsule 3    Sig: TAKE 1 CAPSULE BY MOUTH EVERY DAY AS NEEDED FOR PAIN     Analgesics:  COX2 Inhibitors Passed - 04/19/2021  3:11 AM      Passed - HGB in normal range and within 360 days    Hemoglobin  Date Value Ref Range Status  07/23/2020 13.1 12.0 - 16.0 Final  03/22/2017 13.2 11.1 - 15.9 g/dL Final         Passed - Cr in normal range and within 360 days    Creatinine  Date Value Ref Range Status  07/23/2020 0.8 0.5 - 1.1 Final  06/19/2012 0.77 0.60 - 1.30 mg/dL Final   Creatinine, Ser  Date Value Ref Range Status  03/08/2018 0.53 0.44 - 1.00 mg/dL Final         Passed - Patient is not pregnant      Passed - Valid encounter within last 12 months    Recent Outpatient Visits          3 weeks ago Moderate episode of recurrent major depressive disorder ([place])   [place], Jolene T, NP   1 month ago Acute otitis media, unspecified otitis media type   [facility] Mc[place], Lauren A, NP   2 months ago Moderate episode of recurrent major depressive disorder ([place])   [place], Jolene T, NP   4 months ago Moderate episode of recurrent major depressive disorder ([place])   [place], Jolene T, NP   5 months ago Moderate episode of recurrent major depressive disorder ([place])   [place] Cannady, Barbaraann Faster, NP      Future Appointments            In 2 weeks Cannady, Barbaraann Faster, NP [employer]

## 2021-04-21 ENCOUNTER — Encounter: Payer: Self-pay | Admitting: Nurse Practitioner

## 2021-04-21 DIAGNOSIS — M1711 Unilateral primary osteoarthritis, right knee: Secondary | ICD-10-CM | POA: Diagnosis not present

## 2021-04-21 DIAGNOSIS — M170 Bilateral primary osteoarthritis of knee: Secondary | ICD-10-CM | POA: Diagnosis not present

## 2021-04-21 LAB — COLOGUARD: COLOGUARD: NEGATIVE

## 2021-04-21 NOTE — Progress Notes (Signed)
Contacted via [place]!!  Woohoo!!  Repeat in 3 years.:)

## 2021-04-22 ENCOUNTER — Telehealth: Payer: Self-pay | Admitting: Nurse Practitioner

## 2021-04-22 ENCOUNTER — Ambulatory Visit: Payer: BC Managed Care – PPO

## 2021-04-22 ENCOUNTER — Ambulatory Visit (INDEPENDENT_AMBULATORY_CARE_PROVIDER_SITE_OTHER): Payer: BC Managed Care – PPO

## 2021-04-22 ENCOUNTER — Other Ambulatory Visit: Payer: Self-pay

## 2021-04-22 ENCOUNTER — Other Ambulatory Visit: Payer: Self-pay | Admitting: Nurse Practitioner

## 2021-04-22 DIAGNOSIS — Z139 Encounter for screening, unspecified: Secondary | ICD-10-CM

## 2021-04-22 DIAGNOSIS — E538 Deficiency of other specified B group vitamins: Secondary | ICD-10-CM

## 2021-04-22 MED ORDER — CYANOCOBALAMIN 1000 MCG/ML IJ SOLN
1000.0000 ug | Freq: Once | INTRAMUSCULAR | Status: AC
  Administered 2021-04-22: 1000 ug via INTRAMUSCULAR

## 2021-04-22 NOTE — Telephone Encounter (Signed)
Pt is requesting an update on the prior authorization for [facility].  Please contact patient with information at [phone].

## 2021-04-23 ENCOUNTER — Ambulatory Visit
Admission: RE | Admit: 2021-04-23 | Discharge: 2021-04-23 | Disposition: A | Discharge status: Home / Self Care | Payer: BC Managed Care – PPO | Source: Ambulatory Visit | Attending: Nurse Practitioner | Admitting: Nurse Practitioner

## 2021-04-23 DIAGNOSIS — F5105 Insomnia due to other mental disorder: Secondary | ICD-10-CM | POA: Diagnosis not present

## 2021-04-23 DIAGNOSIS — F331 Major depressive disorder, recurrent, moderate: Secondary | ICD-10-CM | POA: Diagnosis not present

## 2021-04-23 DIAGNOSIS — F4312 Post-traumatic stress disorder, chronic: Secondary | ICD-10-CM | POA: Diagnosis not present

## 2021-04-23 DIAGNOSIS — Z1231 Encounter for screening mammogram for malignant neoplasm of breast: Secondary | ICD-10-CM | POA: Diagnosis not present

## 2021-04-23 DIAGNOSIS — F411 Generalized anxiety disorder: Secondary | ICD-10-CM | POA: Diagnosis not present

## 2021-04-23 DIAGNOSIS — Z139 Encounter for screening, unspecified: Secondary | ICD-10-CM

## 2021-04-23 NOTE — Telephone Encounter (Signed)
Responded to patient via MyChart encounter.

## 2021-04-24 NOTE — Progress Notes (Signed)
Contacted via MyChart   Normal mammogram, repeat in one year:)

## 2021-04-25 ENCOUNTER — Ambulatory Visit: Payer: BC Managed Care – PPO

## 2021-04-25 ENCOUNTER — Encounter: Payer: Self-pay | Admitting: Nurse Practitioner

## 2021-04-25 DIAGNOSIS — F329 Major depressive disorder, single episode, unspecified: Secondary | ICD-10-CM | POA: Diagnosis not present

## 2021-04-25 DIAGNOSIS — M797 Fibromyalgia: Secondary | ICD-10-CM | POA: Diagnosis not present

## 2021-04-25 DIAGNOSIS — I517 Cardiomegaly: Secondary | ICD-10-CM | POA: Diagnosis not present

## 2021-04-25 DIAGNOSIS — E559 Vitamin D deficiency, unspecified: Secondary | ICD-10-CM | POA: Diagnosis not present

## 2021-04-25 DIAGNOSIS — G3184 Mild cognitive impairment, so stated: Secondary | ICD-10-CM | POA: Diagnosis not present

## 2021-04-25 DIAGNOSIS — R251 Tremor, unspecified: Secondary | ICD-10-CM | POA: Diagnosis not present

## 2021-04-25 DIAGNOSIS — F17210 Nicotine dependence, cigarettes, uncomplicated: Secondary | ICD-10-CM | POA: Diagnosis not present

## 2021-04-25 DIAGNOSIS — N181 Chronic kidney disease, stage 1: Secondary | ICD-10-CM | POA: Diagnosis not present

## 2021-04-25 DIAGNOSIS — Z01818 Encounter for other preprocedural examination: Secondary | ICD-10-CM | POA: Diagnosis not present

## 2021-04-25 DIAGNOSIS — R9431 Abnormal electrocardiogram [ECG] [EKG]: Secondary | ICD-10-CM | POA: Diagnosis not present

## 2021-04-25 DIAGNOSIS — F900 Attention-deficit hyperactivity disorder, predominantly inattentive type: Secondary | ICD-10-CM | POA: Diagnosis not present

## 2021-04-25 DIAGNOSIS — G8929 Other chronic pain: Secondary | ICD-10-CM | POA: Diagnosis not present

## 2021-04-25 DIAGNOSIS — M25561 Pain in right knee: Secondary | ICD-10-CM | POA: Diagnosis not present

## 2021-04-25 DIAGNOSIS — M170 Bilateral primary osteoarthritis of knee: Secondary | ICD-10-CM | POA: Diagnosis not present

## 2021-04-25 DIAGNOSIS — M159 Polyosteoarthritis, unspecified: Secondary | ICD-10-CM | POA: Diagnosis not present

## 2021-04-27 ENCOUNTER — Encounter: Payer: Self-pay | Admitting: Nurse Practitioner

## 2021-04-28 ENCOUNTER — Telehealth: Payer: Self-pay | Admitting: Nurse Practitioner

## 2021-04-28 NOTE — Telephone Encounter (Signed)
Prior authorization was initiated via [employer] Prom[employer]. Awaiting determination from insurance.

## 2021-04-28 NOTE — Telephone Encounter (Signed)
Rx benefits called and stated that the Pts Rx for Semaglutide-Weight Management ([place]) 1 MG/0.5ML SOAJ  needs a PA but this can not be submitted through Cover my meds and they called to give the information on how to submit this to the reviewer / please advise   They have their own website / that is [email or web address]  The member id# is [id number] the telephone number in the contacts section is the prior auth team

## 2021-05-02 NOTE — Telephone Encounter (Signed)
Cathy Mitchell with Cover My Meds in reference to Semaglutide-Weight Management [facility]) 1 MG/0.5ML SOAJ  say that prior auth came back with an error and she wanted to help get this corrected Please call Ph#  [phone]  and give Ref# [id number]

## 2021-05-05 NOTE — Telephone Encounter (Signed)
Spoke with CoverMyMeds representative and was notified that the prior authorization did not go through via CoverMyMeds. Prior authorization was initiated via [employer] Prompt.

## 2021-05-06 ENCOUNTER — Encounter: Payer: Self-pay | Admitting: Nurse Practitioner

## 2021-05-06 ENCOUNTER — Other Ambulatory Visit: Payer: Self-pay

## 2021-05-06 ENCOUNTER — Ambulatory Visit: Payer: BC Managed Care – PPO | Admitting: Nurse Practitioner

## 2021-05-06 VITALS — BP 109/79 | HR 74 | Temp 98.2°F | Ht 66.5 in | Wt 254.4 lb

## 2021-05-06 DIAGNOSIS — G259 Extrapyramidal and movement disorder, unspecified: Secondary | ICD-10-CM

## 2021-05-06 DIAGNOSIS — E559 Vitamin D deficiency, unspecified: Secondary | ICD-10-CM | POA: Diagnosis not present

## 2021-05-06 DIAGNOSIS — E538 Deficiency of other specified B group vitamins: Secondary | ICD-10-CM

## 2021-05-06 DIAGNOSIS — F900 Attention-deficit hyperactivity disorder, predominantly inattentive type: Secondary | ICD-10-CM | POA: Diagnosis not present

## 2021-05-06 DIAGNOSIS — Z6841 Body Mass Index (BMI) 40.0 and over, adult: Secondary | ICD-10-CM

## 2021-05-06 DIAGNOSIS — F418 Other specified anxiety disorders: Secondary | ICD-10-CM

## 2021-05-06 DIAGNOSIS — M797 Fibromyalgia: Secondary | ICD-10-CM | POA: Diagnosis not present

## 2021-05-06 DIAGNOSIS — F331 Major depressive disorder, recurrent, moderate: Secondary | ICD-10-CM | POA: Diagnosis not present

## 2021-05-06 DIAGNOSIS — E66813 Obesity, class 3: Secondary | ICD-10-CM

## 2021-05-06 DIAGNOSIS — M170 Bilateral primary osteoarthritis of knee: Secondary | ICD-10-CM

## 2021-05-06 MED ORDER — CYANOCOBALAMIN 1000 MCG/ML IJ SOLN
1000.0000 ug | Freq: Once | INTRAMUSCULAR | Status: AC
  Administered 2021-05-06: 1000 ug via INTRAMUSCULAR

## 2021-05-06 NOTE — Patient Instructions (Signed)
Knee Arthroscopy Knee arthroscopy is a surgery to examine the inside of the knee joint and repair any damage to cartilage, surfaces, and other soft tissues around the joint. You may have this surgery if nonsurgical treatment has not relieved your symptoms. Knee arthroscopy may be used to: Repair a torn ligament or other torn tissues. Ligaments are tissues that connect bones to each other. Remove bone fragments. Remove a fluid-filled sac (cyst). Treat kneecap (patella)problems. Treat septic knee. This is an advanced infection in the knee. Arthroscopic surgery is done using a thin tube that has a light and camera on the end of it (arthroscope). The arthroscope is placed through a small incision, and the camera sends images to a screen in the operating room. The images are used to help perform the surgery. Tell a health care provider about: Any allergies you have. All medicines you are taking, including vitamins, herbs, eye drops, creams, and over-the-counter medicines. Any problems you or family members have had with anesthetic medicines. Any blood disorders you have. Any surgeries you have had. Any medical conditions you have. Whether you are pregnant or may be pregnant. What are the risks? Generally, this is a safe procedure. However, problems may occur, including: Infection. Bleeding. Allergic reactions to medicines. Damage to blood vessels, nerves, or tissues in the knee. A blood clot that forms in the leg and travels to the lung (pulmonary embolism). Failure of the surgery to relieve symptoms. Knee stiffness. What happens before the procedure? Staying hydrated Follow instructions from your health care provider about hydration, which may include: Up to 2 hours before the procedure - you may continue to drink clear liquids, such as water, clear fruit juice, black coffee, and plain tea.  Eating and drinking restrictions Follow instructions from your health care provider about eating  and drinking, which may include: 8 hours before the procedure - stop eating heavy meals or foods, such as meat, fried foods, or fatty foods. 6 hours before the procedure - stop eating light meals or foods, such as toast or cereal. 6 hours before the procedure - stop drinking milk or drinks that contain milk. 2 hours before the procedure - stop drinking clear liquids. Medicines Ask your health care provider about: Changing or stopping your regular medicines. This is especially important if you are taking diabetes medicines or blood thinners. Taking medicines such as aspirin and ibuprofen. These medicines can thin your blood. Do not take these medicines unless your health care provider tells you to take them. Taking over-the-counter medicines, vitamins, herbs, and supplements. General instructions You may have a physical exam and tests, such as an X-ray, CT scan, or MRI. Do not drink alcohol unless your health care provider says that you can. Do not use any products that contain nicotine or tobacco for at least 4 weeks before the procedure. These products include cigarettes, e-cigarettes, and chewing tobacco. If you need help quitting, ask your health care provider. Plan to have a responsible adult take you home from the hospital or clinic. If you will be going home right after the procedure, plan to have a responsible adult care for you for the time you are told. This is important. Ask your health care provider: How your surgery site will be marked. What steps will be taken to help prevent infection. These steps may include: Removing hair at the surgery site. Washing skin with a germ-killing soap. Taking antibiotic medicine. What happens during the procedure?  An IV will be inserted into one of your  veins. You will be given one or more of the following: A medicine to help you relax (sedative). A medicine to numb the knee area (local anesthetic). A medicine to make you fall asleep (general  anesthetic). A medicine that is injected into an area of your body to numb everything below the injection site (regional anesthetic). This may be injected into your groin or thigh. A cuff may be placed around your upper leg to slow blood flow to your lower leg during the procedure. Several small incisions will be made around your knee. Your knee joint will be rinsed (flushed) and filled with sterile saline. This is a germ-free solution made of salt and water. This expands the area to help your surgeon see your joint more clearly. An arthroscope will be passed through one of your incisions, into your knee joint. Other surgical instruments will be passed through the other incisions. Then, your surgeon will examine and repair your knee as needed. The sterile saline will be drained from your knee, and the cuff will be removed from your upper leg. Your incisions will be closed with adhesive strips or stitches, also called sutures, and covered with a bandage (dressing). The procedure may vary among health care providers and hospitals. What happens after the procedure?  Your blood pressure, heart rate, breathing rate, and blood oxygen level will be monitored until you leave the hospital or clinic. You will be given pain medicine as needed. You may be given medicine to lower your risk of blood clots. You may have to wear compression stockings. These stockings help to prevent blood clots and reduce swelling in your legs. You may be given a knee brace or immobilizer. Do not drive or use machinery until your health care provider approves. Summary Knee arthroscopy is a surgery to examine or repair the inside of your knee joint. Before the procedure, follow instructions from your health care provider about eating and drinking. Plan to have a responsible adult take you home from the hospital or clinic. This information is not intended to replace advice given to you by your health care provider. Make sure you  discuss any questions you have with your health care provider. Document Revised: 07/17/2019 Document Reviewed: 07/17/2019 Elsevier Patient Education  [place].

## 2021-05-06 NOTE — Progress Notes (Signed)
BP 109/79    Pulse 74    Temp 98.2 F (36.8 C) (Oral)    Ht 5' 6.5" (1.689 m)    Wt 254 lb 6.4 oz (115.4 kg)    LMP 02/09/2017 (Within Weeks)    SpO2 97%    BMI 40.45 kg/m    Subjective:    Patient ID: Cathy Mitchell, female    DOB: [birth year], 56 y.o.   MRN: [id number]  HPI: Cathy Mitchell is a 56 y.o. female  Chief Complaint  Patient presents with   Weight Check   Mood    Patient states her mood has been OK and denies having any problems or concerns.    FIBROMYALGIA/TREMOR Seen by [facility] neurology team for tremor and fibromyalgia with MRI performed on 12/11/20 noting chronic small vessel disease.  Had follow-up with neurology on 02/26/21-- she is currently seeing functional movement disorder clinic at [facility] and saw them on 04/07/21, she is to Dr. Metta Clines with [facility] psychology for CBT.  She is taking Gabapentin 300 MG at bedtime for leg tremors.   Pain status: stable Satisfied with current treatment?: no Medication side effects: no Medication compliance: good compliance Duration:  Location:  Quality: dull, aching, and burning Current pain level: mild Previous pain level: mild Aggravating factors: lifting and movement Alleviating factors: Gabapentin Previous pain specialty evaluation: no Non-narcotic analgesic meds: no Narcotic contract:no Treatments attempted: Gabapentin and PT  DEPRESSION/ADHD/PTSD Followed by Dr. Nicolasa Ducking = last visit 04/23/21.  Taking Celexa 40 MG, Ritalin (recent change as had no benefit from Vyvanse or Adderall), and Melatonin at this time.  She reports some decreased brain fog with the Ritalin.  Working with therapist -- Doreene Burke, via Dr. Nicolasa Ducking office as well -- for PTSD.    On Wegovy for weight loss -- currently taking 1 MG weekly and tolerating well -- has hit a halt in weight loss at this time.  She is currently down 37 pounds since March 2022.  Continues twice a month B12 injections in office due to low levels (current level 444) + Vitamin D every 3 days.   She is getting prepped for surgery with [facility] - planned for 05/14/20 == Right TKA -- Dr. Okey Dupre. Duration: stable Anxious mood: yes  Excessive worrying: yes Irritability: yes  Sweating: no Nausea: no Palpitations:no Hyperventilation: no Panic attacks: no Agoraphobia: no  Obscessions/compulsions: no Depressed mood: yes Depression screen [facility] 2/9 05/06/2021 02/12/2021 12/18/2020 10/23/2020 09/10/2020  Decreased Interest 1 3 1 1 2   Down, Depressed, Hopeless 0 3 1 1 2   PHQ - 2 Score 1 6 2 2 4   Altered sleeping 3 3 1 2 2   Tired, decreased energy 1 3 1 1 2   Change in appetite 0 0 1 1 3   Feeling bad or failure about yourself  0 3 0 0 2  Trouble concentrating 3 3 0 0 1  Moving slowly or fidgety/restless 3 3 0 0 0  Suicidal thoughts 0 0 0 0 0  PHQ-9 Score 11 21 5 6 14   Difficult doing work/chores - - Not difficult at all Somewhat difficult Somewhat difficult  Some recent data might be hidden  Anhedonia: no Weight changes: no Insomnia: yes hard to stay asleep  Hypersomnia: no Fatigue/loss of energy: yes Feelings of worthlessness: yes Feelings of guilt: yes Impaired concentration/indecisiveness: yes Suicidal ideations: no  Crying spells: yes Recent Stressors/Life Changes: yes   Relationship problems: no   Family stress: yes     Financial stress: no  Job stress: no    Recent death/loss: no GAD 7 : Generalized Anxiety Score 05/06/2021 02/12/2021 12/18/2020 10/23/2020  Nervous, Anxious, on Edge 1 3 1 2   Control/stop worrying 1 3 1 2   Worry too much - different things 1 3 1 1   Trouble relaxing 1 3 1 1   Restless 1 3 0 1  Easily annoyed or irritable 0 1 0 1  Afraid - awful might happen 0 1 1 1   Total GAD 7 Score 5 17 5 9   Anxiety Difficulty - Extremely difficult Not difficult at all Somewhat difficult   Relevant past medical, surgical, family and social history reviewed and updated as indicated. Interim medical history since our last visit reviewed. Allergies and medications  reviewed and updated.  Review of Systems  Constitutional:  Negative for activity change, appetite change, diaphoresis, fatigue and unexpected weight change.  Respiratory: Negative.    Cardiovascular: Negative.   Gastrointestinal: Negative.   Neurological: Negative.   Psychiatric/Behavioral:  Positive for decreased concentration and sleep disturbance. Negative for self-injury and suicidal ideas. The patient is nervous/anxious.    Per HPI unless specifically indicated above     Objective:    BP 109/79    Pulse 74    Temp 98.2 F (36.8 C) (Oral)    Ht 5' 6.5" (1.689 m)    Wt 254 lb 6.4 oz (115.4 kg)    LMP 02/09/2017 (Within Weeks)    SpO2 97%    BMI 40.45 kg/m   Wt Readings from Last 3 Encounters:  05/06/21 254 lb 6.4 oz (115.4 kg)  04/11/21 253 lb 9.6 oz (115 kg)  03/28/21 257 lb 12.8 oz (116.9 kg)    Physical Exam Vitals and nursing note reviewed.  Constitutional:      General: She is awake. She is not in acute distress.    Appearance: She is well-developed and well-groomed. She is morbidly obese. She is not ill-appearing.  HENT:     Head: Normocephalic.     Right Ear: Hearing normal.     Left Ear: Hearing normal.  Eyes:     General: Lids are normal.        Right eye: No discharge.        Left eye: No discharge.     Conjunctiva/sclera: Conjunctivae normal.     Pupils: Pupils are equal, round, and reactive to light.  Neck:     Thyroid: No thyromegaly.     Vascular: No carotid bruit or JVD.  Cardiovascular:     Rate and Rhythm: Normal rate and regular rhythm.     Heart sounds: Normal heart sounds. No murmur heard.   No gallop.  Pulmonary:     Effort: Pulmonary effort is normal. No accessory muscle usage or respiratory distress.     Breath sounds: Normal breath sounds.  Abdominal:     General: Bowel sounds are normal.     Palpations: Abdomen is soft.  Musculoskeletal:     Cervical back: Normal range of motion and neck supple.     Right lower leg: No edema.     Left  lower leg: No edema.  Lymphadenopathy:     Cervical: No cervical adenopathy.  Skin:    General: Skin is warm and dry.  Neurological:     Mental Status: She is alert and oriented to person, place, and time.     Motor: Tremor present.     Gait: Gait is intact.     Deep Tendon Reflexes:  Reflex Scores:      Brachioradialis reflexes are 1+ on the right side and 1+ on the left side.      Patellar reflexes are 1+ on the right side and 1+ on the left side.    Comments: Bilateral upper extremity tremor L>R -- varies from resting to active tremor.  No rigidity noted or cogwheel.  Normal gait.  No flat affect.  Psychiatric:        Attention and Perception: Attention normal.        Mood and Affect: Mood normal.        Speech: Speech normal.        Behavior: Behavior normal. Behavior is cooperative.        Thought Content: Thought content normal.   Results for orders placed or performed in visit on 03/28/21  ANA w/Reflex if Positive  Result Value Ref Range   Anti Nuclear Antibody (ANA) Negative Negative  C-reactive protein  Result Value Ref Range   CRP 2 0 - 10 mg/L  Sed Rate (ESR)  Result Value Ref Range   Sed Rate 21 0 - 40 mm/hr  Hepatitis C antibody  Result Value Ref Range   Hep C Virus Ab <0.1 0.0 - 0.9 s/co ratio  Cologuard  Result Value Ref Range   COLOGUARD Negative Negative      Assessment & Plan:   Problem List Items Addressed This Visit       Nervous and Auditory   Functional movement disorder    Followed by [facility] neurology at this time, continue this collaboration.  Recent notes and MRI reviewed. She is to start therapy via a [facility] referral.          Musculoskeletal and Integument   Osteoarthritis of knee (Bilateral) (R>L) (Chronic)    Scheduled for upcoming knee replacement on 05/14/21.      Relevant Medications   ASPIRIN LOW DOSE 81 MG EC tablet   traMADol (ULTRAM) 50 MG tablet     Other   Fibromyalgia (Chronic)    Chronic, ongoing, followed by neurology  and PT at this time.  Recent notes and MRI reviewed.  Will continue Gabapentin 300 MG daily.        Relevant Medications   ASPIRIN LOW DOSE 81 MG EC tablet   traMADol (ULTRAM) 50 MG tablet   Attention deficit hyperactivity disorder (ADHD), predominantly inattentive type    Followed by Dr. Nicolasa Ducking with psychiatry -- currently on Ritalin - recently started.      B12 deficiency    Chronic, ongoing.  Continue injections of B12 in office, she obtains labs via [place] and sends to provider.  Recent level 444.      BMI 40.0-44.9, adult [facility])    Refer to obesity plan of care.      Relevant Medications   methylphenidate 36 MG PO CR tablet   Depression - Primary    Chronic, ongoing, currently being followed by psychiatry and therapy.  Denies SI/HI.  Continue current medication regimen as prescribed by them.  Discussed at length with her today.  She is to report all side effect concerns to them.  Return in April.      Obesity    BMI 40.45, praised for 37 pound weight loss -- at goal for knee surgery. Recommended eating smaller high protein, low fat meals more frequently and exercising 30 mins a day 5 times a week with a goal of 10-15lb weight loss in the next 3 months. Patient voiced their understanding  and motivation to adhere to these recommendations.       Relevant Medications   methylphenidate 36 MG PO CR tablet   Situational anxiety    Chronic, ongoing, currently being followed by psychiatry and therapy.  Denies SI/HI.  Continue current medication regimen as prescribed by them.  Discussed at length with her today.      Vitamin D deficiency    Chronic, ongoing, continue supplement.  She obtains labs via [place] and sends to provider.  Recent lab 62.3.        Time: 25 minutes, >50% spent counseling/or care coordination  Follow up plan: Return in about 2 months (around 07/16/2021) for MOOD AND FIBROMYALGIA + B12 + WEIGHT.

## 2021-05-06 NOTE — Assessment & Plan Note (Signed)
Chronic, ongoing.  Continue injections of B12 in office, she obtains labs via [place] and sends to provider.  Recent level 444.

## 2021-05-06 NOTE — Assessment & Plan Note (Signed)
Chronic, ongoing, continue supplement.  She obtains labs via [place] and sends to provider.  Recent lab 62.3.

## 2021-05-06 NOTE — Assessment & Plan Note (Signed)
Chronic, ongoing, followed by neurology and PT at this time.  Recent notes and MRI reviewed.  Will continue Gabapentin 300 MG daily.

## 2021-05-06 NOTE — Assessment & Plan Note (Signed)
Scheduled for upcoming knee replacement on 05/14/21.

## 2021-05-06 NOTE — Assessment & Plan Note (Signed)
Followed by Dr. Nicolasa Ducking with psychiatry -- currently on Ritalin - recently started.

## 2021-05-06 NOTE — Assessment & Plan Note (Signed)
Followed by [place] neurology at this time, continue this collaboration.  Recent notes and MRI reviewed. She is to start therapy via a [facility] referral.

## 2021-05-06 NOTE — Assessment & Plan Note (Addendum)
Chronic, ongoing, currently being followed by psychiatry and therapy.  Denies SI/HI.  Continue current medication regimen as prescribed by them.  Discussed at length with her today.  She is to report all side effect concerns to them.  Return in April.

## 2021-05-06 NOTE — Assessment & Plan Note (Signed)
Chronic, ongoing, currently being followed by psychiatry and therapy.  Denies SI/HI.  Continue current medication regimen as prescribed by them.  Discussed at length with her today.

## 2021-05-06 NOTE — Assessment & Plan Note (Signed)
BMI 40.45, praised for 37 pound weight loss -- at goal for knee surgery. Recommended eating smaller high protein, low fat meals more frequently and exercising 30 mins a day 5 times a week with a goal of 10-15lb weight loss in the next 3 months. Patient voiced their understanding and motivation to adhere to these recommendations.

## 2021-05-06 NOTE — Assessment & Plan Note (Signed)
Refer to obesity plan of care. 

## 2021-05-09 ENCOUNTER — Ambulatory Visit: Payer: BC Managed Care – PPO | Admitting: Nurse Practitioner

## 2021-05-14 DIAGNOSIS — M25561 Pain in right knee: Secondary | ICD-10-CM | POA: Diagnosis not present

## 2021-05-14 DIAGNOSIS — M1711 Unilateral primary osteoarthritis, right knee: Secondary | ICD-10-CM | POA: Diagnosis not present

## 2021-05-14 DIAGNOSIS — G8929 Other chronic pain: Secondary | ICD-10-CM | POA: Diagnosis not present

## 2021-05-14 DIAGNOSIS — I129 Hypertensive chronic kidney disease with stage 1 through stage 4 chronic kidney disease, or unspecified chronic kidney disease: Secondary | ICD-10-CM | POA: Diagnosis not present

## 2021-05-14 DIAGNOSIS — N189 Chronic kidney disease, unspecified: Secondary | ICD-10-CM | POA: Diagnosis not present

## 2021-05-14 DIAGNOSIS — Z4789 Encounter for other orthopedic aftercare: Secondary | ICD-10-CM | POA: Diagnosis not present

## 2021-05-19 DIAGNOSIS — F418 Other specified anxiety disorders: Secondary | ICD-10-CM | POA: Diagnosis not present

## 2021-05-19 DIAGNOSIS — F909 Attention-deficit hyperactivity disorder, unspecified type: Secondary | ICD-10-CM | POA: Diagnosis not present

## 2021-05-19 DIAGNOSIS — F331 Major depressive disorder, recurrent, moderate: Secondary | ICD-10-CM | POA: Diagnosis not present

## 2021-05-23 ENCOUNTER — Encounter: Payer: Self-pay | Admitting: Nurse Practitioner

## 2021-05-23 ENCOUNTER — Telehealth (INDEPENDENT_AMBULATORY_CARE_PROVIDER_SITE_OTHER): Payer: BC Managed Care – PPO | Admitting: Nurse Practitioner

## 2021-05-23 DIAGNOSIS — F331 Major depressive disorder, recurrent, moderate: Secondary | ICD-10-CM

## 2021-05-23 DIAGNOSIS — G259 Extrapyramidal and movement disorder, unspecified: Secondary | ICD-10-CM | POA: Diagnosis not present

## 2021-05-23 DIAGNOSIS — F900 Attention-deficit hyperactivity disorder, predominantly inattentive type: Secondary | ICD-10-CM | POA: Diagnosis not present

## 2021-05-23 DIAGNOSIS — F418 Other specified anxiety disorders: Secondary | ICD-10-CM

## 2021-05-23 DIAGNOSIS — Z96651 Presence of right artificial knee joint: Secondary | ICD-10-CM

## 2021-05-23 NOTE — Assessment & Plan Note (Signed)
Followed by [facility] at this time, continue this collaboration.  Recent notes and MRI reviewed. She has started therapy and CBT with Dr. Metta Clines at [facility].

## 2021-05-23 NOTE — Progress Notes (Signed)
LMP 02/09/2017 (Within Weeks)    Subjective:    Patient ID: Cathy Mitchell Guardian, female    DOB: 10-Aug-1965, 56 y.o.   MRN: [id number]  HPI: Cathy Mitchell is a 56 y.o. female  Chief Complaint  Patient presents with   Follow-up    Patient states she has some questions regarding what's the next steps from a previous visit.    This visit was completed via video visit through MyChart due to the restrictions of the COVID-19 pandemic. All issues as above were discussed and addressed. Physical exam was done as above through visual confirmation on video through MyChart. If it was felt that the patient should be evaluated in the office, they were directed there. The patient verbally consented to this visit. Location of the patient: home Location of the provider: work Those involved with this call:  Provider: Marnee Guarneri, DNP CMA: Irena Reichmann, [place] Desk/Registration: [employer]  Time spent on call:  21 minutes with patient face to face via video conference. More than 50% of this time was spent in counseling and coordination of care. 15 minutes total spent in review of patient's record and preparation of their chart.  I verified patient identity using two factors (patient name and date of birth). Patient consents verbally to being seen via telemedicine visit today.    POST SURGERY: Presents today for follow-up, had recent right arthroplasty on 05/14/21 = Dr. Okey Dupre. On Wegovy for weight loss -- currently taking 1 MG weekly and tolerating well -- has hit a halt in weight loss at this time. She is currently down 37 pounds since March 2022.  Continues twice a month B12 injections in office due to low levels (current level 444) + Vitamin D every 3 days.    DEPRESSION/ADHD/PTSD Followed by Dr. Nicolasa Ducking = last visit 04/23/21.  Taking Celexa 40 MG, Ritalin, and Melatonin at this time.  She reports some decreased brain fog with the Ritalin, however she has stopped this as it has caused discomfort to  her heart.   Working with therapist -- previously Clinical research associate and now has a new therapist, via Dr. Nicolasa Ducking office as well -- for PTSD.  Recently saw Dr. Metta Clines for CBT at [facility] and will be working further on this with her therapy wise.  She is concerned about Tourette's syndrome being present. Duration: stable Anxious mood: yes  Excessive worrying: yes Irritability: yes  Sweating: no Nausea: no Palpitations:no Hyperventilation: no Panic attacks: no Agoraphobia: no  Obscessions/compulsions: no Depressed mood: yes Depression screen [facility] 2/9 05/23/2021 05/06/2021 02/12/2021 12/18/2020 10/23/2020  Decreased Interest 1 1 3 1 1   Down, Depressed, Hopeless 0 0 3 1 1   PHQ - 2 Score 1 1 6 2 2   Altered sleeping 1 3 3 1 2   Tired, decreased energy 2 1 3 1 1   Change in appetite 1 0 0 1 1  Feeling bad or failure about yourself  0 0 3 0 0  Trouble concentrating 1 3 3  0 0  Moving slowly or fidgety/restless 0 3 3 0 0  Suicidal thoughts 0 0 0 0 0  PHQ-9 Score 6 11 21 5 6   Difficult doing work/chores Not difficult at all - - Not difficult at all Somewhat difficult  Some recent data might be hidden     Relevant past medical, surgical, family and social history reviewed and updated as indicated. Interim medical history since our last visit reviewed. Allergies and medications reviewed and updated.  Review of Systems  Constitutional:  Negative for activity change, appetite change, diaphoresis, fatigue and unexpected weight change.  Respiratory: Negative.    Cardiovascular: Negative.   Gastrointestinal: Negative.   Neurological: Negative.   Psychiatric/Behavioral:  Positive for decreased concentration and sleep disturbance. Negative for self-injury and suicidal ideas. The patient is nervous/anxious.    Per HPI unless specifically indicated above     Objective:    LMP 02/09/2017 (Within Weeks)   Wt Readings from Last 3 Encounters:  05/06/21 254 lb 6.4 oz (115.4 kg)  04/11/21 253 lb 9.6 oz (115 kg)   03/28/21 257 lb 12.8 oz (116.9 kg)    Physical Exam Vitals and nursing note reviewed.  Constitutional:      General: She is awake. She is not in acute distress.    Appearance: She is well-developed and well-groomed. She is obese. She is not ill-appearing or toxic-appearing.  HENT:     Head: Normocephalic.     Right Ear: Hearing normal.     Left Ear: Hearing normal.  Eyes:     General: Lids are normal.        Right eye: No discharge.        Left eye: No discharge.     Conjunctiva/sclera: Conjunctivae normal.  Pulmonary:     Effort: Pulmonary effort is normal. No accessory muscle usage or respiratory distress.  Musculoskeletal:     Cervical back: Normal range of motion.  Neurological:     Mental Status: She is alert and oriented to person, place, and time.  Psychiatric:        Attention and Perception: Attention normal.        Mood and Affect: Mood normal.        Behavior: Behavior normal. Behavior is cooperative.        Thought Content: Thought content normal.        Judgment: Judgment normal.   Results for orders placed or performed in visit on 03/28/21  ANA w/Reflex if Positive  Result Value Ref Range   Anti Nuclear Antibody (ANA) Negative Negative  C-reactive protein  Result Value Ref Range   CRP 2 0 - 10 mg/L  Sed Rate (ESR)  Result Value Ref Range   Sed Rate 21 0 - 40 mm/hr  Hepatitis C antibody  Result Value Ref Range   Hep C Virus Ab <0.1 0.0 - 0.9 s/co ratio  Cologuard  Result Value Ref Range   COLOGUARD Negative Negative      Assessment & Plan:   Problem List Items Addressed This Visit       Nervous and Auditory   Functional movement disorder    Followed by [facility] neurology at this time, continue this collaboration.  Recent notes and MRI reviewed. She has started therapy and CBT with Dr. Metta Clines at [facility].        Other   Attention deficit hyperactivity disorder (ADHD), predominantly inattentive type    Followed by Dr. Nicolasa Ducking with psychiatry --  currently on Ritalin - recently started.      Depression - Primary    Chronic, ongoing, currently being followed by psychiatry and therapy.  Denies SI/HI.  Continue current medication regimen as prescribed by them.  Discussed at length with her today.  She is to report all side effect concerns to them.  Return in April.      History of right knee joint replacement    On 05/14/21 -- overall stable at this time.  Continue collaboration with ortho.      Situational anxiety  Chronic, ongoing, currently being followed by psychiatry and therapy.  Denies SI/HI.  Continue current medication regimen as prescribed by them.  Discussed at length with her today.       I discussed the assessment and treatment plan with the patient. The patient was provided an opportunity to ask questions and all were answered. The patient agreed with the plan and demonstrated an understanding of the instructions.   The patient was advised to call back or seek an in-person evaluation if the symptoms worsen or if the condition fails to improve as anticipated.   I provided 21+ minutes of time during this encounter.   Follow up plan: Return for as scheduled 07/04/21.

## 2021-05-23 NOTE — Assessment & Plan Note (Signed)
Chronic, ongoing, currently being followed by psychiatry and therapy.  Denies SI/HI.  Continue current medication regimen as prescribed by them.  Discussed at length with her today.

## 2021-05-23 NOTE — Patient Instructions (Signed)
Living With Attention Deficit Hyperactivity Disorder If you have been diagnosed with attention deficit hyperactivity disorder (ADHD), you may be relieved that you now know why you have felt or behaved a certain way. Still, you may feel overwhelmed about the treatment ahead. You may also wonder how to get the support you need and how to deal with the condition day-to-day. With treatment and support, you can live with ADHD and manage your symptoms. How to manage lifestyle changes Managing stress Stress is your body's reaction to life changes and events, both good and bad. To cope with the stress of an ADHD diagnosis, it may help to: Learn more about ADHD. Exercise regularly. Even a short daily walk can lower stress levels. Participate in training or education programs (including social skills training classes) that teach you to deal with symptoms.  Medicines Your health care provider may suggest certain medicines if he or she feels that they will help to improve your condition. Stimulant medicines are usually prescribed to treat ADHD, and therapy may also be prescribed. It is important to: Avoid using alcohol and other substances that may prevent your medicines from working properly (may interact). Talk with your pharmacist or health care provider about all the medicines that you take, their possible side effects, and what medicines are safe to take together. Make it your goal to take part in all treatment decisions (shared decision-making). Ask about possible side effects of medicines that your health care provider recommends, and tell him or her how you feel about having those side effects. It is best if shared decision-making with your health care provider is part of your total treatment plan. Relationships To strengthen your relationships with family members while treating your condition, consider taking part in family therapy. You might also attend self-help groups alone or with a loved one. Be  honest about how your symptoms affect your relationships. Make an effort to communicate respectfully instead of fighting, and find ways to show others that you care. Psychotherapy may be useful in helping you cope with how ADHD affects your relationships. How to recognize changes in your condition The following signs may mean that your treatment is working well and your condition is improving: Consistently being on time for appointments. Being more organized at home and work. Other people noticing improvements in your behavior. Achieving goals that you set for yourself. Thinking more clearly. The following signs may mean that your treatment is not working very well: Feeling impatience or more confusion. Missing, forgetting, or being late for appointments. An increasing sense of disorganization and messiness. More difficulty in reaching goals that you set for yourself. Loved ones becoming angry or frustrated with you. Follow these instructions at home: Take over-the-counter and prescription medicines only as told by your health care provider. Check with your health care provider before taking any new medicines. Create structure and an organized atmosphere at home. For example: Make a list of tasks, then rank them from most important to least important. Work on one task at a time until your listed tasks are done. Make a daily schedule and follow it consistently every day. Use an appointment calendar, and check it 2 or 3 times a day to keep on track. Keep it with you when you leave the house. Create spaces where you keep certain things, and always put things back in their places after you use them. Keep all follow-up visits as told by your health care provider. This is important. Where to find support Talking to others    Keep emotion out of important discussions and speak in a calm, logical way. Listen closely and patiently to your loved ones. Try to understand their point of view, and try to  avoid getting defensive. Take responsibility for the consequences of your actions. Ask that others do not take your behaviors personally. Aim to solve problems as they come up, and express your feelings instead of bottling them up. Talk openly about what you need from your loved ones and how they can support you. Consider going to family therapy sessions or having your family meet with a specialist who deals with ADHD-related behavior problems. Finances Not all insurance plans cover mental health care, so it is important to check with your insurance carrier. If paying for co-pays or counseling services is a problem, search for a local or county mental health care center. Public mental health care services may be offered there at a low cost or no cost when you are not able to see a private health care provider. If you are taking medicine for ADHD, you may be able to get the generic form, which may be less expensive than brand-name medicine. Some makers of prescription medicines also offer help to patients who cannot afford the medicines that they need. Questions to ask your health care provider: What are the risks and benefits of taking medicines? Would I benefit from therapy? How often should I follow up with a health care provider? Contact a health care provider if: You have side effects from your medicines, such as: Repeated muscle twitches, coughing, or speech outbursts. Sleep problems. Loss of appetite. Depression. New or worsening behavior problems. Dizziness. Unusually fast heartbeat. Stomach pains. Headaches. Get help right away if: You have a severe reaction to a medicine. Your behavior suddenly gets worse. Summary With treatment and support, you can live with ADHD and manage your symptoms. The medicines that are most often prescribed for ADHD are stimulants. Consider taking part in family therapy or self-help groups with family members or friends. When you talk with friends  and family about your ADHD, be patient and communicate openly. Take over-the-counter and prescription medicines only as told by your health care provider. Check with your health care provider before taking any new medicines. This information is not intended to replace advice given to you by your health care provider. Make sure you discuss any questions you have with your health care provider. Document Revised: 08/30/2019 Document Reviewed: 08/30/2019 Elsevier Patient Education  2022 [employer]vier Inc.  

## 2021-05-23 NOTE — Assessment & Plan Note (Signed)
Chronic, ongoing, currently being followed by psychiatry and therapy.  Denies SI/HI.  Continue current medication regimen as prescribed by them.  Discussed at length with her today.  She is to report all side effect concerns to them.  Return in April.

## 2021-05-23 NOTE — Assessment & Plan Note (Signed)
Followed by Dr. Nicolasa Ducking with psychiatry -- currently on Ritalin - recently started.

## 2021-05-23 NOTE — Assessment & Plan Note (Signed)
On 05/14/21 -- overall stable at this time.  Continue collaboration with ortho.

## 2021-05-28 DIAGNOSIS — M531 Cervicobrachial syndrome: Secondary | ICD-10-CM | POA: Diagnosis not present

## 2021-05-28 DIAGNOSIS — M9902 Segmental and somatic dysfunction of thoracic region: Secondary | ICD-10-CM | POA: Diagnosis not present

## 2021-05-28 DIAGNOSIS — M546 Pain in thoracic spine: Secondary | ICD-10-CM | POA: Diagnosis not present

## 2021-05-28 DIAGNOSIS — M9901 Segmental and somatic dysfunction of cervical region: Secondary | ICD-10-CM | POA: Diagnosis not present

## 2021-05-29 ENCOUNTER — Other Ambulatory Visit: Payer: Self-pay

## 2021-05-29 ENCOUNTER — Ambulatory Visit (INDEPENDENT_AMBULATORY_CARE_PROVIDER_SITE_OTHER): Payer: BC Managed Care – PPO

## 2021-05-29 DIAGNOSIS — Z23 Encounter for immunization: Secondary | ICD-10-CM

## 2021-05-29 DIAGNOSIS — M1711 Unilateral primary osteoarthritis, right knee: Secondary | ICD-10-CM | POA: Diagnosis not present

## 2021-05-29 DIAGNOSIS — E538 Deficiency of other specified B group vitamins: Secondary | ICD-10-CM | POA: Diagnosis not present

## 2021-05-29 DIAGNOSIS — Z471 Aftercare following joint replacement surgery: Secondary | ICD-10-CM | POA: Diagnosis not present

## 2021-05-29 DIAGNOSIS — Z96651 Presence of right artificial knee joint: Secondary | ICD-10-CM | POA: Diagnosis not present

## 2021-06-02 DIAGNOSIS — M546 Pain in thoracic spine: Secondary | ICD-10-CM | POA: Diagnosis not present

## 2021-06-02 DIAGNOSIS — M9901 Segmental and somatic dysfunction of cervical region: Secondary | ICD-10-CM | POA: Diagnosis not present

## 2021-06-02 DIAGNOSIS — M531 Cervicobrachial syndrome: Secondary | ICD-10-CM | POA: Diagnosis not present

## 2021-06-02 DIAGNOSIS — M9902 Segmental and somatic dysfunction of thoracic region: Secondary | ICD-10-CM | POA: Diagnosis not present

## 2021-06-04 DIAGNOSIS — M546 Pain in thoracic spine: Secondary | ICD-10-CM | POA: Diagnosis not present

## 2021-06-04 DIAGNOSIS — M531 Cervicobrachial syndrome: Secondary | ICD-10-CM | POA: Diagnosis not present

## 2021-06-04 DIAGNOSIS — M9902 Segmental and somatic dysfunction of thoracic region: Secondary | ICD-10-CM | POA: Diagnosis not present

## 2021-06-04 DIAGNOSIS — M9901 Segmental and somatic dysfunction of cervical region: Secondary | ICD-10-CM | POA: Diagnosis not present

## 2021-06-10 ENCOUNTER — Encounter: Payer: Self-pay | Admitting: Nurse Practitioner

## 2021-06-16 DIAGNOSIS — M9901 Segmental and somatic dysfunction of cervical region: Secondary | ICD-10-CM | POA: Diagnosis not present

## 2021-06-16 DIAGNOSIS — M9902 Segmental and somatic dysfunction of thoracic region: Secondary | ICD-10-CM | POA: Diagnosis not present

## 2021-06-16 DIAGNOSIS — M546 Pain in thoracic spine: Secondary | ICD-10-CM | POA: Diagnosis not present

## 2021-06-16 DIAGNOSIS — M531 Cervicobrachial syndrome: Secondary | ICD-10-CM | POA: Diagnosis not present

## 2021-06-17 ENCOUNTER — Ambulatory Visit (INDEPENDENT_AMBULATORY_CARE_PROVIDER_SITE_OTHER): Payer: BC Managed Care – PPO

## 2021-06-17 ENCOUNTER — Other Ambulatory Visit: Payer: Self-pay

## 2021-06-17 DIAGNOSIS — Z0189 Encounter for other specified special examinations: Secondary | ICD-10-CM | POA: Diagnosis not present

## 2021-06-17 DIAGNOSIS — E538 Deficiency of other specified B group vitamins: Secondary | ICD-10-CM

## 2021-06-19 DIAGNOSIS — F411 Generalized anxiety disorder: Secondary | ICD-10-CM | POA: Diagnosis not present

## 2021-06-19 DIAGNOSIS — F4312 Post-traumatic stress disorder, chronic: Secondary | ICD-10-CM | POA: Diagnosis not present

## 2021-06-19 DIAGNOSIS — F5105 Insomnia due to other mental disorder: Secondary | ICD-10-CM | POA: Diagnosis not present

## 2021-06-19 DIAGNOSIS — F331 Major depressive disorder, recurrent, moderate: Secondary | ICD-10-CM | POA: Diagnosis not present

## 2021-06-20 MED ORDER — WEGOVY 1.7 MG/0.75ML ~~LOC~~ SOAJ: 1.7000 mg | SUBCUTANEOUS | 4 refills | Status: DC

## 2021-06-23 DIAGNOSIS — M546 Pain in thoracic spine: Secondary | ICD-10-CM | POA: Diagnosis not present

## 2021-06-23 DIAGNOSIS — M9901 Segmental and somatic dysfunction of cervical region: Secondary | ICD-10-CM | POA: Diagnosis not present

## 2021-06-23 DIAGNOSIS — M9902 Segmental and somatic dysfunction of thoracic region: Secondary | ICD-10-CM | POA: Diagnosis not present

## 2021-06-23 DIAGNOSIS — M531 Cervicobrachial syndrome: Secondary | ICD-10-CM | POA: Diagnosis not present

## 2021-06-25 DIAGNOSIS — M546 Pain in thoracic spine: Secondary | ICD-10-CM | POA: Diagnosis not present

## 2021-06-25 DIAGNOSIS — M531 Cervicobrachial syndrome: Secondary | ICD-10-CM | POA: Diagnosis not present

## 2021-06-25 DIAGNOSIS — M9902 Segmental and somatic dysfunction of thoracic region: Secondary | ICD-10-CM | POA: Diagnosis not present

## 2021-06-25 DIAGNOSIS — M9901 Segmental and somatic dysfunction of cervical region: Secondary | ICD-10-CM | POA: Diagnosis not present

## 2021-06-26 ENCOUNTER — Ambulatory Visit (INDEPENDENT_AMBULATORY_CARE_PROVIDER_SITE_OTHER): Payer: BC Managed Care – PPO | Admitting: Nurse Practitioner

## 2021-06-26 ENCOUNTER — Encounter: Payer: Self-pay | Admitting: Nurse Practitioner

## 2021-06-26 VITALS — BP 138/82 | HR 80 | Temp 97.7°F | Wt 255.0 lb

## 2021-06-26 DIAGNOSIS — E559 Vitamin D deficiency, unspecified: Secondary | ICD-10-CM

## 2021-06-26 DIAGNOSIS — F418 Other specified anxiety disorders: Secondary | ICD-10-CM

## 2021-06-26 DIAGNOSIS — Z6841 Body Mass Index (BMI) 40.0 and over, adult: Secondary | ICD-10-CM

## 2021-06-26 DIAGNOSIS — G259 Extrapyramidal and movement disorder, unspecified: Secondary | ICD-10-CM | POA: Diagnosis not present

## 2021-06-26 DIAGNOSIS — F900 Attention-deficit hyperactivity disorder, predominantly inattentive type: Secondary | ICD-10-CM

## 2021-06-26 DIAGNOSIS — F331 Major depressive disorder, recurrent, moderate: Secondary | ICD-10-CM

## 2021-06-26 DIAGNOSIS — Z96651 Presence of right artificial knee joint: Secondary | ICD-10-CM

## 2021-06-26 DIAGNOSIS — E538 Deficiency of other specified B group vitamins: Secondary | ICD-10-CM

## 2021-06-26 DIAGNOSIS — M797 Fibromyalgia: Secondary | ICD-10-CM

## 2021-06-26 MED ORDER — CYANOCOBALAMIN 1000 MCG/ML IJ SOLN
1000.0000 ug | Freq: Once | INTRAMUSCULAR | Status: AC
  Administered 2021-06-26: 1000 ug via INTRAMUSCULAR

## 2021-06-26 NOTE — Assessment & Plan Note (Addendum)
Chronic, ongoing, followed by neurology at this time.  Recent notes and MRI reviewed.  Will continue Gabapentin 300 MG daily.   ?

## 2021-06-26 NOTE — Assessment & Plan Note (Signed)
Followed by [facility] neurology at this time, continue this collaboration.  Recent notes and MRI reviewed.  

## 2021-06-26 NOTE — Assessment & Plan Note (Signed)
Overall healing well, continue collaboration with ortho as needed. ?

## 2021-06-26 NOTE — Assessment & Plan Note (Signed)
Chronic, ongoing, continue supplement.  She obtains labs via [facility] and sends to provider.  Recent lab >40. ? ?

## 2021-06-26 NOTE — Assessment & Plan Note (Signed)
Followed by Dr. Nicolasa Ducking with psychiatry -- She wishes to obtain a second opinion on her ADHD -- will place referral to [facility] psychiatry in [place].currently on Ritalin - recently started. ?

## 2021-06-26 NOTE — Assessment & Plan Note (Signed)
Refer to obesity plan of care. 

## 2021-06-26 NOTE — Assessment & Plan Note (Signed)
BMI 40.54, praised for 37 pound weight loss. Continue Wegovy at 1.7 MG weekly and adjust as needed. Recommended eating smaller high protein, low fat meals more frequently and exercising 30 mins a day 5 times a week with a goal of 10-15lb weight loss in the next 3 months. Patient voiced their understanding and motivation to adhere to these recommendations. ? ?

## 2021-06-26 NOTE — Assessment & Plan Note (Signed)
Chronic, ongoing, currently being followed by psychiatry and therapy.  Denies SI/HI.  Continue current medication regimen as prescribed by them.  Discussed at length with her today.  She wishes to obtain a second opinion on her ADHD -- will place referral to [place] [facility] in [place]. ?

## 2021-06-26 NOTE — Assessment & Plan Note (Signed)
Chronic, ongoing, currently being followed by psychiatry and therapy.  Denies SI/HI.  Continue current medication regimen as prescribed by them.  Discussed at length with her today. She wishes to obtain a second opinion on her ADHD -- will place referral to [place] [facility] in [place]. ?

## 2021-06-26 NOTE — Patient Instructions (Signed)
Managing Anxiety, Adult ?After being diagnosed with anxiety, you may be relieved to know why you have felt or behaved a certain way. You may also feel overwhelmed about the treatment ahead and what it will mean for your life. With care and support, you can manage this condition. ?How to manage lifestyle changes ?Managing stress and anxiety ?Stress is your body's reaction to life changes and events, both good and bad. Most stress will last just a few hours, but stress can be ongoing and can lead to more than just stress. Although stress can play a major role in anxiety, it is not the same as anxiety. Stress is usually caused by something external, such as a deadline, test, or competition. Stress normally passes after the triggering event has ended.  ?Anxiety is caused by something internal, such as imagining a terrible outcome or worrying that something will go wrong that will devastate you. Anxiety often does not go away even after the triggering event is over, and it can become long-term (chronic) worry. It is important to understand the differences between stress and anxiety and to manage your stress effectively so that it does not lead to an anxious response. ?Talk with your health care provider or a counselor to learn more about reducing anxiety and stress. He or she may suggest tension reduction techniques, such as: ?Music therapy. Spend time creating or listening to music that you enjoy and that inspires you. ?Mindfulness-based meditation. Practice being aware of your normal breaths while not trying to control your breathing. It can be done while sitting or walking. ?Centering prayer. This involves focusing on a word, phrase, or sacred image that means something to you and brings you peace. ?Deep breathing. To do this, expand your stomach and inhale slowly through your nose. Hold your breath for 3-5 seconds. Then exhale slowly, letting your stomach muscles relax. ?Self-talk. Learn to notice and identify  thought patterns that lead to anxiety reactions and change those patterns to thoughts that feel peaceful. ?Muscle relaxation. Taking time to tense muscles and then relax them. ?Choose a tension reduction technique that fits your lifestyle and personality. These techniques take time and practice. Set aside 5-15 minutes a day to do them. Therapists can offer counseling and training in these techniques. The training to help with anxiety may be covered by some insurance plans. ?Other things you can do to manage stress and anxiety include: ?Keeping a stress diary. This can help you learn what triggers your reaction and then learn ways to manage your response. ?Thinking about how you react to certain situations. You may not be able to control everything, but you can control your response. ?Making time for activities that help you relax and not feeling guilty about spending your time in this way. ?Doing visual imagery. This involves imagining or creating mental pictures to help you relax. ?Practicing yoga. Through yoga poses, you can lower tension and promote relaxation. ? ?Medicines ?Medicines can help ease symptoms. Medicines for anxiety include: ?Antidepressant medicines. These are usually prescribed for long-term daily control. ?Anti-anxiety medicines. These may be added in severe cases, especially when panic attacks occur. ?Medicines will be prescribed by a health care provider. When used together, medicines, psychotherapy, and tension reduction techniques may be the most effective treatment. ?Relationships ?Relationships can play a big part in helping you recover. Try to spend more time connecting with trusted friends and family members. ?Consider going to couples counseling if you have a partner, taking family education classes, or going to family   therapy. ?Therapy can help you and others better understand your condition. ?How to recognize changes in your anxiety ?Everyone responds differently to treatment for  anxiety. Recovery from anxiety happens when symptoms decrease and stop interfering with your daily activities at home or work. This may mean that you will start to: ?Have better concentration and focus. Worry will interfere less in your daily thinking. ?Sleep better. ?Be less irritable. ?Have more energy. ?Have improved memory. ?It is also important to recognize when your condition is getting worse. Contact your health care provider if your symptoms interfere with home or work and you feel like your condition is not improving. ?Follow these instructions at home: ?Activity ?Exercise. Adults should do the following: ?Exercise for at least 150 minutes each week. The exercise should increase your heart rate and make you sweat (moderate-intensity exercise). ?Strengthening exercises at least twice a week. ?Get the right amount and quality of sleep. Most adults need 7-9 hours of sleep each night. ?Lifestyle ? ?Eat a healthy diet that includes plenty of vegetables, fruits, whole grains, low-fat dairy products, and lean protein. ?Do not eat a lot of foods that are high in fats, added sugars, or salt (sodium). ?Make choices that simplify your life. ?Do not use any products that contain nicotine or tobacco. These products include cigarettes, chewing tobacco, and vaping devices, such as e-cigarettes. If you need help quitting, ask your health care provider. ?Avoid caffeine, alcohol, and certain over-the-counter cold medicines. These may make you feel worse. Ask your pharmacist which medicines to avoid. ?General instructions ?Take over-the-counter and prescription medicines only as told by your health care provider. ?Keep all follow-up visits. This is important. ?Where to find support ?You can get help and support from these sources: ?Self-help groups. ?Online and community organizations. ?A trusted spiritual leader. ?Couples counseling. ?Family education classes. ?Family therapy. ?Where to find more information ?You may find  that joining a support group helps you deal with your anxiety. The following sources can help you locate counselors or support groups near you: ?Mental Health America: www.mentalhealthamerica.net ?Anxiety and Depression Association of America (ADAA): www.adaa.org ?National Alliance on Mental Illness (NAMI): www.nami.org ?Contact a health care provider if: ?You have a hard time staying focused or finishing daily tasks. ?You spend many hours a day feeling worried about everyday life. ?You become exhausted by worry. ?You start to have headaches or frequently feel tense. ?You develop chronic nausea or diarrhea. ?Get help right away if: ?You have a racing heart and shortness of breath. ?You have thoughts of hurting yourself or others. ?If you ever feel like you may hurt yourself or others, or have thoughts about taking your own life, get help right away. Go to your nearest emergency department or: ?Call your local emergency services (911 in the [place] ?Call a suicide crisis helpline, such as the National Suicide Prevention Lifeline at 1-800-273-8255 or 988 in the [place] This is open 24 hours a day in the [place] ?Text the Crisis Text Line at 741741 (in the [place] ?Summary ?Taking steps to learn and use tension reduction techniques can help calm you and help prevent triggering an anxiety reaction. ?When used together, medicines, psychotherapy, and tension reduction techniques may be the most effective treatment. ?Family, friends, and partners can play a big part in supporting you. ?This information is not intended to replace advice given to you by your health care provider. Make sure you discuss any questions you have with your health care provider. ?Document Revised: 10/09/2020 Document Reviewed: 07/07/2020 ?Elsevier Patient   Education ? 2022 [employer]vier Inc. ? ?

## 2021-06-26 NOTE — Progress Notes (Signed)
? ?BP 138/82   Pulse 80   Temp 97.7 ?F (36.5 ?C) (Oral)   Wt 255 lb (115.7 kg)   LMP 02/09/2017 (Within Weeks)   SpO2 97%   BMI 40.54 kg/m?   ? ?Subjective:  ? ? Patient ID: Cathy Mitchell, female    DOB: Mar 18, 1966, 56 y.o.   MRN: [id number] ? ?HPI: ?Cathy Mitchell is a 56 y.o. female ? ?Chief Complaint  ?Patient presents with  ? Fibromyalgia  ? Weight Check  ? Mood  ?  Patient states she has not being doing well as her medication is no longer working. Patient states her Psychiatrist is trying to change her medication and she is not happy with that and would like to discuss with provider different treatment options.   ? ?FIBROMYALGIA/TREMOR ? MRI performed on 12/11/20 noting chronic small vessel disease. Saw [place] neurology team for tremor and fibromyalgia. She is currently seeing functional movement disorder clinic at [facility] and saw them on 04/07/21. She is taking Gabapentin 300 MG at bedtime for leg tremors.   ? ?Had total knee replacement right side on 05/29/21.  Has been going to chiropractor to help straighten gait.  Is overall seeing improvement in pain levels with knee replacement. ?Pain status: stable ?Satisfied with current treatment?: no ?Medication side effects: no ?Medication compliance: good compliance ?Duration:  ?Location:  ?Quality: dull, aching, and burning ?Current pain level: mild ?Previous pain level: mild ?Aggravating factors: lifting and movement ?Alleviating factors: Gabapentin ?Previous pain specialty evaluation: no ?Non-narcotic analgesic meds: no ?Narcotic contract:no ?Treatments attempted: Gabapentin and PT ? ?DEPRESSION/ADHD/PTSD ?Seeing Dr. Nicolasa Ducking = last visit 04/23/21.  Taking Celexa 40 MG, Strattera (has tried Ritalin,Vyvanse and Adderall = all with increased tremors), and Melatonin at this time.  She would like to see a psychiatrist who specializes in Autism and ADHD.  At this time she believes she has Tourette's, as she has always blurted words out a lot. ? ?Was working with therapist --  Doreene Burke, via Dr. Nicolasa Ducking office as well -- for PTSD, but they are no longer with the practice. ? ?On Wegovy for weight loss -- currently taking 1.7 MG weekly (recent increase) -- has hit a halt in weight loss at this time.  Currently down 37 pounds since March 2022.   ? ?Continues twice a month B12 injections in office due to low levels (current level 410) + Vitamin D every 3 days.   ?Duration: stable ?Anxious mood: yes  ?Excessive worrying: yes ?Irritability: yes  ?Sweating: no ?Nausea: no ?Palpitations:no ?Hyperventilation: no ?Panic attacks: no ?Agoraphobia: no  ?Obscessions/compulsions: no ?Depressed mood: yes ? ?  06/26/2021  ? 11:24 AM 05/23/2021  ? 11:43 AM 05/06/2021  ? 10:15 AM 02/12/2021  ?  9:19 AM 12/18/2020  ?  9:28 AM  ?Depression screen PHQ 2/9  ?Decreased Interest 2 1 1 3 1   ?Down, Depressed, Hopeless 3 0 0 3 1  ?PHQ - 2 Score 5 1 1 6 2   ?Altered sleeping 3 1 3 3 1   ?Tired, decreased energy 1 2 1 3 1   ?Change in appetite 0 1 0 0 1  ?Feeling bad or failure about yourself  3 0 0 3 0  ?Trouble concentrating 3 1 3 3  0  ?Moving slowly or fidgety/restless 3 0 3 3 0  ?Suicidal thoughts 0 0 0 0 0  ?PHQ-9 Score 18 6 11 21 5   ?Difficult doing work/chores Extremely dIfficult Not difficult at all   Not difficult at all  ?Anhedonia: no ?  Weight changes: no ?Insomnia: yes hard to stay asleep  ?Hypersomnia: no ?Fatigue/loss of energy: yes ?Feelings of worthlessness: yes ?Feelings of guilt: yes ?Impaired concentration/indecisiveness: yes ?Suicidal ideations: no  ?Crying spells: yes ?Recent Stressors/Life Changes: yes ?  Relationship problems: no ?  Family stress: yes   ?  Financial stress: no  ?  Job stress: no  ?  Recent death/loss: no ? ?  2021-06-29  ? 11:23 AM 05/06/2021  ? 10:16 AM 02/12/2021  ?  9:20 AM 12/18/2020  ?  9:28 AM  ?GAD 7 : Generalized Anxiety Score  ?Nervous, Anxious, on Edge 3 1 3 1   ?Control/stop worrying 3 1 3 1   ?Worry too much - different things 3 1 3 1   ?Trouble relaxing 1 1 3 1   ?Restless 2 1 3  0   ?Easily annoyed or irritable 3 0 1 0  ?Afraid - awful might happen 0 0 1 1  ?Total GAD 7 Score 15 5 17 5   ?Anxiety Difficulty Extremely difficult  Extremely difficult Not difficult at all  ? ?Relevant past medical, surgical, family and social history reviewed and updated as indicated. Interim medical history since our last visit reviewed. ?Allergies and medications reviewed and updated. ? ?Review of Systems  ?Constitutional:  Negative for activity change, appetite change, diaphoresis, fatigue and unexpected weight change.  ?Respiratory: Negative.    ?Cardiovascular: Negative.   ?Gastrointestinal: Negative.   ?Neurological: Negative.   ?Psychiatric/Behavioral:  Positive for decreased concentration and sleep disturbance. Negative for self-injury and suicidal ideas. The patient is nervous/anxious.   ? ?Per HPI unless specifically indicated above ? ?   ?Objective:  ?  ?BP 138/82   Pulse 80   Temp 97.7 ?F (36.5 ?C) (Oral)   Wt 255 lb (115.7 kg)   LMP 02/09/2017 (Within Weeks)   SpO2 97%   BMI 40.54 kg/m?   ?Wt Readings from Last 3 Encounters:  ?2021-06-29 255 lb (115.7 kg)  ?05/06/21 254 lb 6.4 oz (115.4 kg)  ?04/11/21 253 lb 9.6 oz (115 kg)  ?  ?Physical Exam ?Vitals and nursing note reviewed.  ?Constitutional:   ?   General: She is awake. She is not in acute distress. ?   Appearance: She is well-developed and well-groomed. She is morbidly obese. She is not ill-appearing.  ?HENT:  ?   Head: Normocephalic.  ?   Right Ear: Hearing normal.  ?   Left Ear: Hearing normal.  ?Eyes:  ?   General: Lids are normal.     ?   Right eye: No discharge.     ?   Left eye: No discharge.  ?   Conjunctiva/sclera: Conjunctivae normal.  ?   Pupils: Pupils are equal, round, and reactive to light.  ?Neck:  ?   Thyroid: No thyromegaly.  ?   Vascular: No carotid bruit or JVD.  ?Cardiovascular:  ?   Rate and Rhythm: Normal rate and regular rhythm.  ?   Heart sounds: Normal heart sounds. No murmur heard. ?  No gallop.  ?Pulmonary:  ?    Effort: Pulmonary effort is normal. No accessory muscle usage or respiratory distress.  ?   Breath sounds: Normal breath sounds.  ?Abdominal:  ?   General: Bowel sounds are normal.  ?   Palpations: Abdomen is soft.  ?Musculoskeletal:  ?   Cervical back: Normal range of motion and neck supple.  ?   Right lower leg: No edema.  ?   Left lower leg: No edema.  ?Lymphadenopathy:  ?  Cervical: No cervical adenopathy.  ?Skin: ?   General: Skin is warm and dry.  ? ?    ?Neurological:  ?   Mental Status: She is alert and oriented to person, place, and time.  ?   Motor: Tremor present.  ?   Gait: Gait is intact.  ?   Deep Tendon Reflexes:  ?   Reflex Scores: ?     Brachioradialis reflexes are 1+ on the right side and 1+ on the left side. ?     Patellar reflexes are 1+ on the right side and 1+ on the left side. ?   Comments: Bilateral upper extremity tremor L>R -- varies from resting to active tremor.  No rigidity noted or cogwheel.  Normal gait.  No flat affect.  ?Psychiatric:     ?   Attention and Perception: Attention normal.     ?   Mood and Affect: Mood normal.     ?   Speech: Speech normal.     ?   Behavior: Behavior normal. Behavior is cooperative.     ?   Thought Content: Thought content normal.  ? ?Results for orders placed or performed in visit on 03/28/21  ?ANA w/Reflex if Positive  ?Result Value Ref Range  ? Anti Nuclear Antibody (ANA) Negative Negative  ?C-reactive protein  ?Result Value Ref Range  ? CRP 2 0 - 10 mg/L  ?Sed Rate (ESR)  ?Result Value Ref Range  ? Sed Rate 21 0 - 40 mm/hr  ?Hepatitis C antibody  ?Result Value Ref Range  ? Hep C Virus Ab <0.1 0.0 - 0.9 s/co ratio  ?Cologuard  ?Result Value Ref Range  ? COLOGUARD Negative Negative  ? ?   ?Assessment & Plan:  ? ?Problem List Items Addressed This Visit   ? ?  ? Nervous and Auditory  ? Functional movement disorder  ?  Followed by [place] neurology at this time, continue this collaboration.  Recent notes and MRI reviewed.  ?  ?  ?  ? Other  ? Fibromyalgia  (Chronic)  ?  Chronic, ongoing, followed by neurology at this time.  Recent notes and MRI reviewed.  Will continue Gabapentin 300 MG daily.   ?  ?  ? Relevant Medications  ? meloxicam (MOBIC) 7.5 MG tablet  ? Attention deficit

## 2021-06-26 NOTE — Assessment & Plan Note (Signed)
Chronic, ongoing.  Continue injections of B12 in office, she obtains labs via [place] and sends to provider.  Recent level 410. ?

## 2021-06-30 DIAGNOSIS — M531 Cervicobrachial syndrome: Secondary | ICD-10-CM | POA: Diagnosis not present

## 2021-06-30 DIAGNOSIS — M546 Pain in thoracic spine: Secondary | ICD-10-CM | POA: Diagnosis not present

## 2021-06-30 DIAGNOSIS — M9902 Segmental and somatic dysfunction of thoracic region: Secondary | ICD-10-CM | POA: Diagnosis not present

## 2021-06-30 DIAGNOSIS — M9901 Segmental and somatic dysfunction of cervical region: Secondary | ICD-10-CM | POA: Diagnosis not present

## 2021-07-02 DIAGNOSIS — M531 Cervicobrachial syndrome: Secondary | ICD-10-CM | POA: Diagnosis not present

## 2021-07-02 DIAGNOSIS — M546 Pain in thoracic spine: Secondary | ICD-10-CM | POA: Diagnosis not present

## 2021-07-02 DIAGNOSIS — M9902 Segmental and somatic dysfunction of thoracic region: Secondary | ICD-10-CM | POA: Diagnosis not present

## 2021-07-02 DIAGNOSIS — M9901 Segmental and somatic dysfunction of cervical region: Secondary | ICD-10-CM | POA: Diagnosis not present

## 2021-07-04 ENCOUNTER — Ambulatory Visit: Payer: BC Managed Care – PPO | Admitting: Nurse Practitioner

## 2021-07-07 DIAGNOSIS — M1712 Unilateral primary osteoarthritis, left knee: Secondary | ICD-10-CM | POA: Diagnosis not present

## 2021-07-10 ENCOUNTER — Ambulatory Visit (INDEPENDENT_AMBULATORY_CARE_PROVIDER_SITE_OTHER): Payer: BC Managed Care – PPO

## 2021-07-10 DIAGNOSIS — E538 Deficiency of other specified B group vitamins: Secondary | ICD-10-CM

## 2021-07-11 MED ORDER — AMOXICILLIN-POT CLAVULANATE 875-125 MG PO TABS: 1.0000 | ORAL_TABLET | Freq: Two times a day (BID) | ORAL | 0 refills | Status: DC

## 2021-07-11 NOTE — Addendum Note (Signed)
Addended by: Marnee Guarneri T on: 07/11/2021 04:57 PM ? ? Modules accepted: Orders ? ?

## 2021-07-14 ENCOUNTER — Encounter: Payer: Self-pay | Admitting: Nurse Practitioner

## 2021-07-14 DIAGNOSIS — Z96651 Presence of right artificial knee joint: Secondary | ICD-10-CM | POA: Diagnosis not present

## 2021-07-14 DIAGNOSIS — F32A Depression, unspecified: Secondary | ICD-10-CM | POA: Diagnosis not present

## 2021-07-14 DIAGNOSIS — M797 Fibromyalgia: Secondary | ICD-10-CM | POA: Diagnosis not present

## 2021-07-14 DIAGNOSIS — E559 Vitamin D deficiency, unspecified: Secondary | ICD-10-CM | POA: Diagnosis not present

## 2021-07-14 DIAGNOSIS — F900 Attention-deficit hyperactivity disorder, predominantly inattentive type: Secondary | ICD-10-CM | POA: Diagnosis not present

## 2021-07-14 DIAGNOSIS — G894 Chronic pain syndrome: Secondary | ICD-10-CM | POA: Diagnosis not present

## 2021-07-14 DIAGNOSIS — F418 Other specified anxiety disorders: Secondary | ICD-10-CM | POA: Diagnosis not present

## 2021-07-14 DIAGNOSIS — Z01818 Encounter for other preprocedural examination: Secondary | ICD-10-CM | POA: Diagnosis not present

## 2021-07-14 DIAGNOSIS — R251 Tremor, unspecified: Secondary | ICD-10-CM | POA: Diagnosis not present

## 2021-07-14 DIAGNOSIS — F952 Tourette's disorder: Secondary | ICD-10-CM | POA: Diagnosis not present

## 2021-07-14 DIAGNOSIS — G3184 Mild cognitive impairment, so stated: Secondary | ICD-10-CM | POA: Diagnosis not present

## 2021-07-14 DIAGNOSIS — Z91018 Allergy to other foods: Secondary | ICD-10-CM | POA: Diagnosis not present

## 2021-07-14 MED ORDER — AMOXICILLIN 500 MG PO CAPS: 500.0000 mg | ORAL_CAPSULE | Freq: Three times a day (TID) | ORAL | 0 refills | Status: AC

## 2021-07-14 NOTE — Addendum Note (Signed)
Addended by: Marnee Guarneri T on: 07/14/2021 03:33 PM ? ? Modules accepted: Orders ? ?

## 2021-07-23 ENCOUNTER — Ambulatory Visit (INDEPENDENT_AMBULATORY_CARE_PROVIDER_SITE_OTHER): Payer: BC Managed Care – PPO

## 2021-07-23 DIAGNOSIS — E538 Deficiency of other specified B group vitamins: Secondary | ICD-10-CM | POA: Diagnosis not present

## 2021-07-23 MED ORDER — CYANOCOBALAMIN 1000 MCG/ML IJ SOLN
1000.0000 ug | Freq: Once | INTRAMUSCULAR | Status: AC
  Administered 2021-07-23: 1000 ug via INTRAMUSCULAR

## 2021-07-24 ENCOUNTER — Ambulatory Visit: Payer: BC Managed Care – PPO

## 2021-07-30 DIAGNOSIS — M25562 Pain in left knee: Secondary | ICD-10-CM | POA: Diagnosis not present

## 2021-07-30 DIAGNOSIS — M25561 Pain in right knee: Secondary | ICD-10-CM | POA: Diagnosis not present

## 2021-07-30 DIAGNOSIS — Z4789 Encounter for other orthopedic aftercare: Secondary | ICD-10-CM | POA: Diagnosis not present

## 2021-07-30 DIAGNOSIS — G8929 Other chronic pain: Secondary | ICD-10-CM | POA: Diagnosis not present

## 2021-07-30 DIAGNOSIS — G8918 Other acute postprocedural pain: Secondary | ICD-10-CM | POA: Diagnosis not present

## 2021-07-30 DIAGNOSIS — M1712 Unilateral primary osteoarthritis, left knee: Secondary | ICD-10-CM | POA: Diagnosis not present

## 2021-08-14 DIAGNOSIS — F952 Tourette's disorder: Secondary | ICD-10-CM | POA: Diagnosis not present

## 2021-08-15 DIAGNOSIS — Z96652 Presence of left artificial knee joint: Secondary | ICD-10-CM | POA: Diagnosis not present

## 2021-08-19 ENCOUNTER — Other Ambulatory Visit: Payer: Self-pay | Admitting: Nurse Practitioner

## 2021-08-20 DIAGNOSIS — Z0189 Encounter for other specified special examinations: Secondary | ICD-10-CM | POA: Diagnosis not present

## 2021-08-21 NOTE — Telephone Encounter (Signed)
Requested medication (s) are due for refill today: For review  Requested medication (s) are on the active medication list: yes    Last refill: 06/20/21  3 ml  4 refills  Future visit scheduled yes 09/26/21  Notes to clinic:   Please review.  Pharmacy comment: Script Clarification:[facility] SAYS THEY WILL NOT PAY FOR THIS MED UNTIL 06/2022 ??!  Requested Prescriptions  Pending Prescriptions Disp Refills   WEGOVY 1.7 MG/0.75ML SOAJ [Pharmacy Med Name: WEGOVY 1.7 MG/0.75 ML PEN]  4    Sig: Inject 1.7 mg into the skin once a week.     Endocrinology:  Diabetes - GLP-1 Receptor Agonists - semaglutide Failed - 08/19/2021  7:04 PM      Failed - HBA1C in normal range and within 180 days    Hemoglobin A1C  Date Value Ref Range Status  07/23/2020 5.5  Final         Failed - Cr in normal range and within 360 days    Creatinine  Date Value Ref Range Status  07/23/2020 0.8 0.5 - 1.1 Final  06/19/2012 0.77 0.60 - 1.30 mg/dL Final   Creatinine, Ser  Date Value Ref Range Status  03/08/2018 0.53 0.44 - 1.00 mg/dL Final         Passed - Valid encounter within last 6 months    Recent Outpatient Visits           1 month ago Moderate episode of recurrent major depressive disorder ([place])   [place], Jolene T, NP   3 months ago Moderate episode of recurrent major depressive disorder ([place])   [place], Jolene T, NP   3 months ago Moderate episode of recurrent major depressive disorder ([place])   [place], Jolene T, NP   4 months ago Moderate episode of recurrent major depressive disorder ([place])   [place] Cann[place], Jolene T, NP   5 months ago Acute otitis media, unspecified otitis media type   [place], Lauren A, NP       Future Appointments             In 1 month Cannady, Barbaraann Faster, NP [employer]

## 2021-08-22 DIAGNOSIS — R202 Paresthesia of skin: Secondary | ICD-10-CM | POA: Diagnosis not present

## 2021-08-22 DIAGNOSIS — G6289 Other specified polyneuropathies: Secondary | ICD-10-CM | POA: Diagnosis not present

## 2021-09-08 MED ORDER — DICYCLOMINE HCL 20 MG PO TABS: 20.0000 mg | ORAL_TABLET | Freq: Three times a day (TID) | ORAL | 1 refills | Status: AC

## 2021-09-08 NOTE — Addendum Note (Signed)
Addended by: Marnee Guarneri T on: 09/08/2021 09:21 AM   Modules accepted: Orders

## 2021-09-10 DIAGNOSIS — E569 Vitamin deficiency, unspecified: Secondary | ICD-10-CM | POA: Diagnosis not present

## 2021-09-15 ENCOUNTER — Ambulatory Visit (INDEPENDENT_AMBULATORY_CARE_PROVIDER_SITE_OTHER): Payer: BC Managed Care – PPO

## 2021-09-15 DIAGNOSIS — E538 Deficiency of other specified B group vitamins: Secondary | ICD-10-CM | POA: Diagnosis not present

## 2021-09-15 DIAGNOSIS — F952 Tourette's disorder: Secondary | ICD-10-CM | POA: Diagnosis not present

## 2021-09-15 DIAGNOSIS — G629 Polyneuropathy, unspecified: Secondary | ICD-10-CM | POA: Diagnosis not present

## 2021-09-15 DIAGNOSIS — R4189 Other symptoms and signs involving cognitive functions and awareness: Secondary | ICD-10-CM | POA: Diagnosis not present

## 2021-09-15 DIAGNOSIS — M797 Fibromyalgia: Secondary | ICD-10-CM | POA: Diagnosis not present

## 2021-09-25 ENCOUNTER — Encounter (INDEPENDENT_AMBULATORY_CARE_PROVIDER_SITE_OTHER): Payer: Self-pay

## 2021-09-26 ENCOUNTER — Encounter: Payer: Self-pay | Admitting: Nurse Practitioner

## 2021-09-26 ENCOUNTER — Ambulatory Visit (INDEPENDENT_AMBULATORY_CARE_PROVIDER_SITE_OTHER): Payer: BC Managed Care – PPO | Admitting: Nurse Practitioner

## 2021-09-26 VITALS — BP 106/73 | HR 85 | Temp 98.3°F | Ht 66.5 in | Wt 254.6 lb

## 2021-09-26 DIAGNOSIS — F952 Tourette's disorder: Secondary | ICD-10-CM

## 2021-09-26 DIAGNOSIS — F418 Other specified anxiety disorders: Secondary | ICD-10-CM

## 2021-09-26 DIAGNOSIS — F331 Major depressive disorder, recurrent, moderate: Secondary | ICD-10-CM

## 2021-09-26 DIAGNOSIS — G4733 Obstructive sleep apnea (adult) (pediatric): Secondary | ICD-10-CM

## 2021-09-26 DIAGNOSIS — E538 Deficiency of other specified B group vitamins: Secondary | ICD-10-CM | POA: Diagnosis not present

## 2021-09-26 DIAGNOSIS — G259 Extrapyramidal and movement disorder, unspecified: Secondary | ICD-10-CM

## 2021-09-26 DIAGNOSIS — G629 Polyneuropathy, unspecified: Secondary | ICD-10-CM | POA: Diagnosis not present

## 2021-09-26 DIAGNOSIS — Z6841 Body Mass Index (BMI) 40.0 and over, adult: Secondary | ICD-10-CM

## 2021-09-26 DIAGNOSIS — M797 Fibromyalgia: Secondary | ICD-10-CM

## 2021-09-26 DIAGNOSIS — Z96651 Presence of right artificial knee joint: Secondary | ICD-10-CM

## 2021-09-26 DIAGNOSIS — F900 Attention-deficit hyperactivity disorder, predominantly inattentive type: Secondary | ICD-10-CM

## 2021-09-26 MED ORDER — CYANOCOBALAMIN 1000 MCG/ML IJ SOLN
1000.0000 ug | Freq: Once | INTRAMUSCULAR | Status: AC
  Administered 2021-09-26: 1000 ug via INTRAMUSCULAR

## 2021-09-26 MED ORDER — GABAPENTIN 100 MG PO CAPS: 200.0000 mg | ORAL_CAPSULE | Freq: Two times a day (BID) | ORAL | 4 refills | Status: DC

## 2021-09-26 MED ORDER — CITALOPRAM HYDROBROMIDE 40 MG PO TABS: 40.0000 mg | ORAL_TABLET | Freq: Every day | ORAL | 4 refills | Status: AC

## 2021-09-26 MED ORDER — BUSPIRONE HCL 5 MG PO TABS: 5.0000 mg | ORAL_TABLET | Freq: Two times a day (BID) | ORAL | 4 refills | Status: DC | PRN

## 2021-09-26 NOTE — Assessment & Plan Note (Signed)
Followed by [place] neurology at this time, continue this collaboration.  Recent notes and MRI reviewed.

## 2021-09-26 NOTE — Assessment & Plan Note (Signed)
New diagnosis in May 2023.   Continue to collaborate with neurology at [facility], recent notes and labs reviewed.

## 2021-09-26 NOTE — Assessment & Plan Note (Signed)
BMI 40.48, maintaining 30 pound weight loss. Restart Wegovy when available again. Recommended eating smaller high protein, low fat meals more frequently and exercising 30 mins a day 5 times a week with a goal of 10-15lb weight loss in the next 3 months. Patient voiced their understanding and motivation to adhere to these recommendations.

## 2021-09-26 NOTE — Progress Notes (Signed)
BP 106/73   Pulse 85   Temp 98.3 F (36.8 C) (Oral)   Ht 5' 6.5" (1.689 m)   Wt 254 lb 9.6 oz (115.5 kg)   LMP 02/09/2017 (Within Weeks)   SpO2 97%   BMI 40.48 kg/m    Subjective:    Patient ID: Cathy Mitchell, female    DOB: 04-May-1965, 56 y.o.   MRN: [id number]  HPI: Cathy Mitchell is a 56 y.o. female  Chief Complaint  Patient presents with   Weight Check    Patient is here for Weight Check.    WEIGHT CHECK: Was on Wegovy for several months with benefit of weight loss, which enabled her to attain knee replacement.  Started out with Ozempic last year, was 284 lbs in July 2022 and now 254 lbs.  She has currently been out of [facility] for months due to shortage.  Reports had recent period of eating lots of chocolate.  Has not used CPAP in long while, as reports no issues.  FIBROMYALGIA/TREMOR Saw [facility] neurology team for tremor and fibromyalgia last 09/15/21 and has been diagnosed with small fiber neuropathy + Tourette syndrome -- they increased Gabapentin to 200 MG BID + placed new neuropsychology referral. She did see functional movement disorder clinic at [facility], last 04/07/21. MRI performed on 12/11/20 noting chronic small vessel disease.   Had total knee replacement right side on 05/29/21.  Reports she now feels like she never had surgery -- pain is severe, she is continuing to take Oxycodone and Dilaudid as needed -- is worried as she knows will not be able to continue to take this as will not get refills.  Last saw ortho 08/15/21. Pain status: fluctuating Satisfied with current treatment?: no Medication side effects: no Medication compliance: good compliance Duration: chronic Location: knees and multiple areas Quality: dull, aching, and burning Current pain level: mild Previous pain level: mild Aggravating factors: lifting and movement Alleviating factors: Gabapentin Previous pain specialty evaluation: no Non-narcotic analgesic meds: no Narcotic contract:no Treatments attempted:  Gabapentin and PT + multiple other medications, including opioids  DEPRESSION/ADHD/PTSD No longer seeing Dr. Nicolasa Ducking, last visit 04/23/21 -- did not feel she had a good relationship with her.  She came off of all mood medications with exception of Celexa and Gabapentin (for mood and neuropathy) -- would like PCP to takeover Celexa.   A second referral was placed for alternate psychiatry but she never attended.  Was working with therapist -- Doreene Burke, via Dr. Nicolasa Ducking office as well -- for PTSD, but stopped seeing therapy.  Continues on B12 injections in office + Vitamin D every 3 days for low levels in past.  Duration: stable Anxious mood: yes  Excessive worrying: yes Irritability: yes  Sweating: no Nausea: no Palpitations:no Hyperventilation: no Panic attacks: no Agoraphobia: no  Obscessions/compulsions: no Depressed mood: yes    09/26/2021    1:27 PM 06/26/2021   11:24 AM 05/23/2021   11:43 AM 05/06/2021   10:15 AM 02/12/2021    9:19 AM  Depression screen PHQ 2/9  Decreased Interest _0 Down, Depressed, Hopeless 2 3 0 0 3  PHQ - 2 Score _1 Altered sleeping _2 Tired, decreased energy _3 Change in appetite 1 0 1 0 0  Feeling bad or failure about yourself  2 3 0 0 3  Trouble concentrating _4 Moving slowly or fidgety/restless  1 3 0 3 3  Suicidal thoughts 0 0 0 0 0  PHQ-9 Score _0 Difficult doing work/chores Somewhat difficult Extremely dIfficult Not difficult at all    Anhedonia: no Weight changes: no Insomnia: yes hard to stay asleep  Hypersomnia: no Fatigue/loss of energy: yes Feelings of worthlessness: yes Feelings of guilt: yes Impaired concentration/indecisiveness: yes Suicidal ideations: no  Crying spells: yes Recent Stressors/Life Changes: yes   Relationship problems: no   Family stress: yes     Financial stress: no    Job stress: no    Recent death/loss: no    10/16/2021    1:27 PM 06/26/2021   11:23 AM 05/06/2021    10:16 AM 02/12/2021    9:20 AM  GAD 7 : Generalized Anxiety Score  Nervous, Anxious, on Edge _1 Control/stop worrying _2 Worry too much - different things _3 Trouble relaxing _4 Restless _5 Easily annoyed or irritable 1 3 0 1  Afraid - awful might happen 1 0 0 1  Total GAD 7 Score _6 Anxiety Difficulty  Extremely difficult  Extremely difficult   Relevant past medical, surgical, family and social history reviewed and updated as indicated. Interim medical history since our last visit reviewed. Allergies and medications reviewed and updated.  Review of Systems  Constitutional:  Negative for activity change, appetite change, diaphoresis, fatigue and unexpected weight change.  Respiratory: Negative.    Cardiovascular: Negative.   Gastrointestinal: Negative.   Musculoskeletal:  Positive for arthralgias.  Neurological: Negative.   Psychiatric/Behavioral:  Positive for decreased concentration and sleep disturbance. Negative for self-injury and suicidal ideas. The patient is nervous/anxious.     Per HPI unless specifically indicated above     Objective:    BP 106/73   Pulse 85   Temp 98.3 F (36.8 C) (Oral)   Ht 5' 6.5" (1.689 m)   Wt 254 lb 9.6 oz (115.5 kg)   LMP 02/09/2017 (Within Weeks)   SpO2 97%   BMI 40.48 kg/m   Wt Readings from Last 3 Encounters:  16-Oct-2021 254 lb 9.6 oz (115.5 kg)  06/26/21 255 lb (115.7 kg)  05/06/21 254 lb 6.4 oz (115.4 kg)    Physical Exam Vitals and nursing note reviewed.  Constitutional:      General: She is awake. She is not in acute distress.    Appearance: She is well-developed and well-groomed. She is morbidly obese. She is not ill-appearing.  HENT:     Head: Normocephalic.     Right Ear: Hearing normal.     Left Ear: Hearing normal.  Eyes:     General: Lids are normal.        Right eye: No discharge.        Left eye: No discharge.     Conjunctiva/sclera: Conjunctivae normal.     Pupils:  Pupils are equal, round, and reactive to light.  Neck:     Thyroid: No thyromegaly.     Vascular: No carotid bruit or JVD.  Cardiovascular:     Rate and Rhythm: Normal rate and regular rhythm.     Heart sounds: Normal heart sounds. No murmur heard.    No gallop.  Pulmonary:     Effort: Pulmonary effort is normal. No accessory muscle usage or respiratory distress.     Breath sounds: Normal breath sounds.  Abdominal:  General: Bowel sounds are normal.     Palpations: Abdomen is soft.  Musculoskeletal:     Cervical back: Normal range of motion and neck supple.     Right knee: Crepitus present. No swelling. Decreased range of motion. Tenderness present.     Left knee: Normal.     Right lower leg: No edema.     Left lower leg: No edema.     Comments: Antalgic gait.  Lymphadenopathy:     Cervical: No cervical adenopathy.  Skin:    General: Skin is warm and dry.  Neurological:     Mental Status: She is alert and oriented to person, place, and time.     Motor: Tremor present.     Gait: Gait is intact.     Deep Tendon Reflexes:     Reflex Scores:      Brachioradialis reflexes are 1+ on the right side and 1+ on the left side.      Patellar reflexes are 1+ on the right side and 1+ on the left side.    Comments: Bilateral upper extremity tremor L>R -- varies from resting to active tremor.  No rigidity noted or cogwheel.  Normal gait.  No flat affect.  Psychiatric:        Attention and Perception: Attention normal.        Mood and Affect: Mood normal.        Speech: Speech normal.        Behavior: Behavior normal. Behavior is cooperative.        Thought Content: Thought content normal.    Results for orders placed or performed in visit on 03/28/21  ANA w/Reflex if Positive  Result Value Ref Range   Anti Nuclear Antibody (ANA) Negative Negative  C-reactive protein  Result Value Ref Range   CRP 2 0 - 10 mg/L  Sed Rate (ESR)  Result Value Ref Range   Sed Rate 21 0 - 40 mm/hr   Hepatitis C antibody  Result Value Ref Range   Hep C Virus Ab <0.1 0.0 - 0.9 s/co ratio  Cologuard  Result Value Ref Range   COLOGUARD Negative Negative      Assessment & Plan:   Problem List Items Addressed This Visit       Respiratory   Obstructive sleep apnea    Chronic, ongoing.  Recommend she restart using CPAP, has not used in some time.        Nervous and Auditory   Functional movement disorder    Followed by [facility] neurology at this time, continue this collaboration.  Recent notes and MRI reviewed.       Small fiber neuropathy    New diagnosis in May 2023.   Continue to collaborate with neurology at [facility], recent notes and labs reviewed.      Relevant Medications   gabapentin (NEURONTIN) 100 MG capsule   citalopram (CELEXA) 40 MG tablet   busPIRone (BUSPAR) 5 MG tablet   Other Relevant Orders   Uric acid   Tourette syndrome    Diagnosed in May 2023, will continue collaboration with [facility].  Recent notes and labs reviewed.        Other   History of right knee joint replacement    Continue to collaborate with ortho, may need pain clinic in future if ongoing chronic pain.      Fibromyalgia (Chronic)    Chronic, ongoing, followed by neurology at this time.  Recent notes and labs reviewed.  Will  continue Gabapentin as ordered by neurology.      Relevant Medications   gabapentin (NEURONTIN) 100 MG capsule   citalopram (CELEXA) 40 MG tablet   Attention deficit hyperactivity disorder (ADHD), predominantly inattentive type    Previously followed by Dr. Nicolasa Ducking with psychiatry -- at this time no medications.  Placed new referral to psychiatry previous visit, but never attended.        B12 deficiency    Chronic, ongoing.  Continue injections of B12 in office, she obtains labs via [place] and sends to provider.        Depression - Primary    Chronic, ongoing, currently not followed by psychiatry and therapy.  Denies SI/HI.  Continue current medication  regimen and adjust as needed, Celexa.  Discussed at length with her today.        Relevant Medications   citalopram (CELEXA) 40 MG tablet   busPIRone (BUSPAR) 5 MG tablet   Obesity    BMI 40.48, maintaining 30 pound weight loss. Restart Wegovy when available again. Recommended eating smaller high protein, low fat meals more frequently and exercising 30 mins a day 5 times a week with a goal of 10-15lb weight loss in the next 3 months. Patient voiced their understanding and motivation to adhere to these recommendations.       Situational anxiety    Refer to depression plan of care.      Relevant Medications   citalopram (CELEXA) 40 MG tablet   busPIRone (BUSPAR) 5 MG tablet      Follow up plan: Return in about 6 months (around 03/28/2022) for A1c check, WEIGHT CHECK, Small Fiber Neuropathy, MOOD.

## 2021-09-26 NOTE — Assessment & Plan Note (Signed)
Chronic, ongoing.  Continue injections of B12 in office, she obtains labs via [place] and sends to provider.

## 2021-09-26 NOTE — Assessment & Plan Note (Signed)
Chronic, ongoing.  Recommend she restart using CPAP, has not used in some time.

## 2021-09-26 NOTE — Assessment & Plan Note (Signed)
Continue to collaborate with ortho, may need pain clinic in future if ongoing chronic pain.

## 2021-09-26 NOTE — Patient Instructions (Signed)

## 2021-09-26 NOTE — Assessment & Plan Note (Signed)
Chronic, ongoing, currently not followed by psychiatry and therapy.  Denies SI/HI.  Continue current medication regimen and adjust as needed, Celexa.  Discussed at length with her today.

## 2021-09-26 NOTE — Assessment & Plan Note (Signed)
Diagnosed in May 2023, will continue collaboration with [facility].  Recent notes and labs reviewed.

## 2021-09-26 NOTE — Assessment & Plan Note (Signed)
Previously followed by Dr. Nicolasa Ducking with psychiatry -- at this time no medications.  Placed new referral to psychiatry previous visit, but never attended.

## 2021-09-26 NOTE — Addendum Note (Signed)
Addended by: Irena Reichmann on: 09/26/2021 02:39 PM   Modules accepted: Orders

## 2021-09-26 NOTE — Assessment & Plan Note (Signed)
Refer to depression plan of care. 

## 2021-09-26 NOTE — Assessment & Plan Note (Signed)
Chronic, ongoing, followed by neurology at this time.  Recent notes and labs reviewed.  Will continue Gabapentin as ordered by neurology.

## 2021-09-27 LAB — URIC ACID: Uric Acid: 3.4 mg/dL (ref 3.0–7.2)

## 2021-09-28 NOTE — Progress Notes (Signed)
Contacted via MyChart   Good morning Kalyse, your labs have returned and your uric acid level is nice and normal.  No gout present.  Good news!!

## 2021-10-15 ENCOUNTER — Encounter: Payer: Self-pay | Admitting: Nurse Practitioner

## 2021-10-15 ENCOUNTER — Other Ambulatory Visit: Payer: Self-pay | Admitting: Nurse Practitioner

## 2021-10-15 MED ORDER — WEGOVY 2.4 MG/0.75ML ~~LOC~~ SOAJ: 2.4000 mg | SUBCUTANEOUS | 4 refills | Status: DC

## 2021-10-21 ENCOUNTER — Encounter: Payer: Self-pay | Admitting: Cardiovascular Disease

## 2021-10-21 DIAGNOSIS — G259 Extrapyramidal and movement disorder, unspecified: Secondary | ICD-10-CM | POA: Diagnosis not present

## 2021-10-21 DIAGNOSIS — R251 Tremor, unspecified: Secondary | ICD-10-CM | POA: Diagnosis not present

## 2021-10-21 DIAGNOSIS — G629 Polyneuropathy, unspecified: Secondary | ICD-10-CM | POA: Diagnosis not present

## 2021-10-21 DIAGNOSIS — I493 Ventricular premature depolarization: Secondary | ICD-10-CM

## 2021-10-21 DIAGNOSIS — F952 Tourette's disorder: Secondary | ICD-10-CM | POA: Diagnosis not present

## 2021-10-23 ENCOUNTER — Ambulatory Visit (INDEPENDENT_AMBULATORY_CARE_PROVIDER_SITE_OTHER): Payer: BC Managed Care – PPO

## 2021-10-23 DIAGNOSIS — E538 Deficiency of other specified B group vitamins: Secondary | ICD-10-CM | POA: Diagnosis not present

## 2021-10-23 MED ORDER — CYANOCOBALAMIN 1000 MCG/ML IJ SOLN: 1000.0000 ug | Freq: Once | INTRAMUSCULAR | Status: DC

## 2021-10-24 ENCOUNTER — Ambulatory Visit (INDEPENDENT_AMBULATORY_CARE_PROVIDER_SITE_OTHER): Payer: BC Managed Care – PPO

## 2021-10-24 DIAGNOSIS — I493 Ventricular premature depolarization: Secondary | ICD-10-CM

## 2021-10-29 DIAGNOSIS — I493 Ventricular premature depolarization: Secondary | ICD-10-CM

## 2021-10-30 ENCOUNTER — Encounter: Payer: Self-pay | Admitting: Nurse Practitioner

## 2021-11-05 ENCOUNTER — Encounter (INDEPENDENT_AMBULATORY_CARE_PROVIDER_SITE_OTHER): Payer: Self-pay

## 2021-11-07 DIAGNOSIS — I493 Ventricular premature depolarization: Secondary | ICD-10-CM | POA: Diagnosis not present

## 2021-11-11 ENCOUNTER — Other Ambulatory Visit: Payer: Self-pay | Admitting: Nurse Practitioner

## 2021-11-11 NOTE — Telephone Encounter (Signed)
dc'd 07/14/21  "d/c'd  by provider"  Odette Fraction NP  Requested Prescriptions  Refused Prescriptions Disp Refills  . amoxicillin-clavulanate (AUGMENTIN) 875-125 MG tablet [Pharmacy Med Name: AMOXICILLIN-CLAV 875-'125MG'$  TAB] 14 tablet 0    Sig: TAKE 1 TABLET BY MOUTH TWICE A DAY FOR 7 DAYS     Off-Protocol Failed - 11/11/2021 11:54 AM      Failed - Medication not assigned to a protocol, review manually.      Passed - Valid encounter within last 12 months    Recent Outpatient Visits          1 month ago Moderate episode of recurrent major depressive disorder ([place])   [place], Jolene T, NP   4 months ago Moderate episode of recurrent major depressive disorder ([place])   [place], Jolene T, NP   5 months ago Moderate episode of recurrent major depressive disorder ([place])   [place], Jolene T, NP   6 months ago Moderate episode of recurrent major depressive disorder ([place])   [place], Jolene T, NP   7 months ago Moderate episode of recurrent major depressive disorder ([place])   [place], Barbaraann Faster, NP      Future Appointments            In 1 month Hammock, Sheri, NP [facility], [place]   In 1 month Arida, Mertie Clause, MD [facility], [place]   In 4 months [place], Barbaraann Faster, NP [facility], [facility]

## 2021-11-18 ENCOUNTER — Encounter: Payer: Self-pay | Admitting: Nurse Practitioner

## 2021-11-18 ENCOUNTER — Ambulatory Visit: Payer: BC Managed Care – PPO | Admitting: Nurse Practitioner

## 2021-11-18 ENCOUNTER — Ambulatory Visit: Payer: Self-pay | Admitting: *Deleted

## 2021-11-18 VITALS — BP 138/82 | HR 76 | Temp 97.5°F | Ht 66.5 in | Wt 260.8 lb

## 2021-11-18 DIAGNOSIS — H6503 Acute serous otitis media, bilateral: Secondary | ICD-10-CM

## 2021-11-18 DIAGNOSIS — E538 Deficiency of other specified B group vitamins: Secondary | ICD-10-CM

## 2021-11-18 MED ORDER — AMOXICILLIN 875 MG PO TABS: 875.0000 mg | ORAL_TABLET | Freq: Two times a day (BID) | ORAL | 0 refills | Status: AC

## 2021-11-18 MED ORDER — CYANOCOBALAMIN 1000 MCG/ML IJ SOLN
1000.0000 ug | INTRAMUSCULAR | Status: AC
  Administered 2021-11-18 – 2024-01-04 (×6): 1000 ug via INTRAMUSCULAR

## 2021-11-18 NOTE — Assessment & Plan Note (Signed)
Acute to both ears.  Will start treatment with Amoxicillin 875 MG Q12H for 7 days, this is what has worked best for her in past.  Recommend she avoid Q tips to ears.  Tylenol as needed for pain.  TM intact bilaterally at this time.  Return to office if any worsening or ongoing pain.

## 2021-11-18 NOTE — Assessment & Plan Note (Signed)
Chronic, ongoing.  Continue injections of B12 in office, she obtains labs via [place] and sends to provider.

## 2021-11-18 NOTE — Progress Notes (Signed)
BP 138/82   Pulse 76   Temp (!) 97.5 F (36.4 C) (Oral)   Ht 5' 6.5" (1.689 m)   Wt 260 lb 12.8 oz (118.3 kg)   LMP 02/09/2017 (Within Weeks)   SpO2 96%   BMI 41.46 kg/m    Subjective:    Patient ID: Cathy Mitchell, female    DOB: 1965-09-11, 56 y.o.   MRN: [id number]  HPI: Cathy Mitchell is a 56 y.o. female  Chief Complaint  Patient presents with   Ear Pain    Patient is here for Ear Pain. Patient says she is having "two double ear pain." Patient says she usually have the ear pain for three weeks before she is diagnosed with an ear infection. Patient says she has an old Amoxicillin prescription that she had and has been taking currently. Patient says she first noticed symptoms for about a month now. Patient says she mentioned issues at her last visit and was told to she had fluid and to take Allergy Relief to help with symptoms.   EAR PAIN Having ear pain -- both ears, reports this worsened after recent visit with PCP.  She tried taking allergy medication after last visit, but became worse.  Took some leftover Amoxicillin at home recently, a few pills -- felt mostly better but then symptoms returned.   She reports doing better with pink pill only. Duration: weeks Involved ear(s): bilateral Severity:  7/10  Quality:  dull, aching, and throbbing Fever: no Otorrhea: no Upper respiratory infection symptoms: no Pruritus: yes Hearing loss: no Water immersion no Using Q-tips: yes Recurrent otitis media: no -- has them a few times a year Status: worse Treatments attempted: Amoxicillin a few tablets, Tylenol  Relevant past medical, surgical, family and social history reviewed and updated as indicated. Interim medical history since our last visit reviewed. Allergies and medications reviewed and updated.  Review of Systems  Constitutional:  Negative for activity change, appetite change, chills, fatigue and fever.  HENT:  Positive for ear pain. Negative for congestion, ear  discharge, facial swelling, postnasal drip, rhinorrhea, sinus pressure, sinus pain, sneezing, sore throat and voice change.   Eyes:  Negative for pain.  Respiratory:  Negative for cough, chest tightness, shortness of breath and wheezing.   Cardiovascular:  Negative for chest pain, palpitations and leg swelling.  Endocrine: Negative.   Neurological: Negative.   Psychiatric/Behavioral: Negative.      Per HPI unless specifically indicated above     Objective:    BP 138/82   Pulse 76   Temp (!) 97.5 F (36.4 C) (Oral)   Ht 5' 6.5" (1.689 m)   Wt 260 lb 12.8 oz (118.3 kg)   LMP 02/09/2017 (Within Weeks)   SpO2 96%   BMI 41.46 kg/m   Wt Readings from Last 3 Encounters:  11/18/21 260 lb 12.8 oz (118.3 kg)  09/26/21 254 lb 9.6 oz (115.5 kg)  06/26/21 255 lb (115.7 kg)    Physical Exam Vitals and nursing note reviewed.  Constitutional:      General: She is awake. She is not in acute distress.    Appearance: She is well-developed and well-groomed. She is obese. She is not ill-appearing or toxic-appearing.  HENT:     Head: Normocephalic.     Right Ear: Hearing, ear canal and external ear normal. No tenderness. A middle ear effusion is present. Tympanic membrane is injected. Tympanic membrane is not perforated.     Left Ear: Hearing, ear canal and  external ear normal. No tenderness. A middle ear effusion is present. Tympanic membrane is injected. Tympanic membrane is not perforated.     Nose: No rhinorrhea.     Right Sinus: No maxillary sinus tenderness or frontal sinus tenderness.     Left Sinus: No maxillary sinus tenderness or frontal sinus tenderness.     Mouth/Throat:     Mouth: Mucous membranes are moist.     Pharynx: Posterior oropharyngeal erythema (mild with cobblestone appearance) present. No pharyngeal swelling or oropharyngeal exudate.  Eyes:     General: Lids are normal.        Right eye: No discharge.        Left eye: No discharge.     Conjunctiva/sclera:  Conjunctivae normal.     Pupils: Pupils are equal, round, and reactive to light.  Neck:     Thyroid: No thyromegaly.     Vascular: No carotid bruit or JVD.  Cardiovascular:     Rate and Rhythm: Normal rate and regular rhythm.     Heart sounds: Normal heart sounds. No murmur heard.    No gallop.  Pulmonary:     Effort: Pulmonary effort is normal. No accessory muscle usage or respiratory distress.     Breath sounds: Normal breath sounds.  Abdominal:     General: Bowel sounds are normal.     Palpations: Abdomen is soft.  Musculoskeletal:     Cervical back: Normal range of motion and neck supple.     Right lower leg: No edema.     Left lower leg: No edema.  Lymphadenopathy:     Cervical: No cervical adenopathy.  Skin:    General: Skin is warm and dry.  Neurological:     Mental Status: She is alert and oriented to person, place, and time.     Gait: Gait is intact.  Psychiatric:        Attention and Perception: Attention normal.        Mood and Affect: Mood normal.        Speech: Speech normal.        Behavior: Behavior normal. Behavior is cooperative.        Thought Content: Thought content normal.    Results for orders placed or performed in visit on 09/26/21  Uric acid  Result Value Ref Range   Uric Acid 3.4 3.0 - 7.2 mg/dL      Assessment & Plan:   Problem List Items Addressed This Visit       Nervous and Auditory   Acute otitis media - Primary    Acute to both ears.  Will start treatment with Amoxicillin 875 MG Q12H for 7 days, this is what has worked best for her in past.  Recommend she avoid Q tips to ears.  Tylenol as needed for pain.  TM intact bilaterally at this time.  Return to office if any worsening or ongoing pain.      Relevant Medications   amoxicillin (AMOXIL) 875 MG tablet     Other   B12 deficiency    Chronic, ongoing.  Continue injections of B12 in office, she obtains labs via [place] and sends to provider.        Relevant Medications    cyanocobalamin (VITAMIN B12) injection 1,000 mcg     Follow up plan: Return if symptoms worsen or fail to improve.

## 2021-11-18 NOTE — Telephone Encounter (Signed)
  Chief Complaint: ear pain Symptoms: bilateral ear pain, off balance, sinus symptoms Frequency: 2 weeks Pertinent Negatives: Patient denies fever Disposition: '[]'$ ED /'[]'$ Urgent Care (no appt availability in office) / '[x]'$ Appointment(In office/virtual)/ '[]'$  [facility]/ '[]'$ Home Care/ '[]'$ Refused Recommended Disposition /'[]'$ [facility]/ '[]'$  Follow-up with PCP Additional Notes:    Reason for Disposition  Earache  (Exceptions: brief ear pain of < 60 minutes duration, earache occurring during air travel  Answer Assessment - Initial Assessment Questions 1. LOCATION: "Which ear is involved?"     Both ears- chronic ear infection 2. ONSET: "When did the ear start hurting"      2 weeks 3. SEVERITY: "How bad is the pain?"  (Scale 1-10; mild, moderate or severe)   - MILD (1-3): doesn't interfere with normal activities    - MODERATE (4-7): interferes with normal activities or awakens from sleep    - SEVERE (8-10): excruciating pain, unable to do any normal activities      moderate 4. URI SYMPTOMS: "Do you have a runny nose or cough?"     Nose running, headache 5. FEVER: "Do you have a fever?" If Yes, ask: "What is your temperature, how was it measured, and when did it start?"     Not checked 6. CAUSE: "Have you been swimming recently?", "How often do you use Q-TIPS?", "Have you had any recent air travel or scuba diving?"     no 7. OTHER SYMPTOMS: "Do you have any other symptoms?" (e.g., headache, stiff neck, dizziness, vomiting, runny nose, decreased hearing)     Off balance 8. PREGNANCY: "Is there any chance you are pregnant?" "When was your last menstrual period?"     *No Answer*  Protocols used: Bethann Punches

## 2021-11-18 NOTE — Patient Instructions (Signed)
Otitis Media, Adult  Otitis media is a condition in which the middle ear is red and swollen (inflamed) and full of fluid. The middle ear is the part of the ear that contains bones for hearing as well as air that helps send sounds to the brain. The condition usually goes away on its own. What are the causes? This condition is caused by a blockage in the eustachian tube. This tube connects the middle ear to the back of the nose. It normally allows air into the middle ear. The blockage is caused by fluid or swelling. Problems that can cause blockage include: A cold or infection that affects the nose, mouth, or throat. Allergies. An irritant, such as tobacco smoke. Adenoids that have become large. The adenoids are soft tissue located in the back of the throat, behind the nose and the roof of the mouth. Growth or swelling in the upper part of the throat, just behind the nose (nasopharynx). Damage to the ear caused by a change in pressure. This is called barotrauma. What increases the risk? You are more likely to develop this condition if you: Smoke or are exposed to tobacco smoke. Have an opening in the roof of your mouth (cleft palate). Have acid reflux. Have problems in your body's defense system (immune system). What are the signs or symptoms? Symptoms of this condition include: Ear pain. Fever. Problems with hearing. Being tired. Fluid leaking from the ear. Ringing in the ear. How is this treated? This condition can go away on its own within 3-5 days. But if the condition is caused by germs (bacteria) and does not go away on its own, or if it keeps coming back, your doctor may: Give you antibiotic medicines. Give you medicines for pain. Follow these instructions at home: Take over-the-counter and prescription medicines only as told by your doctor. If you were prescribed an antibiotic medicine, take it as told by your doctor. Do not stop taking it even if you start to feel better. Keep  all follow-up visits. Contact a doctor if: You have bleeding from your nose. There is a lump on your neck. You are not feeling better in 5 days. You feel worse instead of better. Get help right away if: You have pain that is not helped with medicine. You have swelling, redness, or pain around your ear. You get a stiff neck. You cannot move part of your face (paralysis). You notice that the bone behind your ear hurts when you touch it. You get a very bad headache. Summary Otitis media means that the middle ear is red, swollen, and full of fluid. This condition usually goes away on its own. If the problem does not go away, treatment may be needed. You may be given medicines to treat the infection or to treat your pain. If you were prescribed an antibiotic medicine, take it as told by your doctor. Do not stop taking it even if you start to feel better. Keep all follow-up visits. This information is not intended to replace advice given to you by your health care provider. Make sure you discuss any questions you have with your health care provider. Document Revised: 06/24/2020 Document Reviewed: 06/24/2020 Elsevier Patient Education  2023 [employer]  

## 2021-11-28 DIAGNOSIS — R569 Unspecified convulsions: Secondary | ICD-10-CM | POA: Diagnosis not present

## 2021-12-02 ENCOUNTER — Encounter: Payer: Self-pay | Admitting: Nurse Practitioner

## 2021-12-04 ENCOUNTER — Encounter: Payer: Self-pay | Admitting: Nurse Practitioner

## 2021-12-10 NOTE — Progress Notes (Signed)
Cardiology Clinic Note   Patient Name: Cathy Mitchell Date of Encounter: 12/12/2021  Primary Care Provider:  Venita Lick, NP Primary Cardiologist:  Kathlyn Sacramento, MD  Patient Profile    56 year old female with a past medical history of OSA, symptomatic PVCs, anxiety/depression, fibromyalgia, hypertension, osteoarthritis, rheumatoid arthritis, who is here today to follow-up on her symptomatic PVCs.  Past Medical History    Past Medical History:  Diagnosis Date   Anxiety    Arthritis    Back pain    Dementia ([place])    Depression    Fibromyalgia    Food allergy    [place]   Hypertension    no longer has hypertension   Joint pain    Kidney problem    Neuromuscular disorder ([place])    L-sided tremors   Osteoarthritis    Other fatigue    Rheumatoid arthritis ([place])    Sleep apnea    does not use C-PAP   Past Surgical History:  Procedure Laterality Date   CARPAL TUNNEL RELEASE Bilateral 2007   DILATION AND CURETTAGE OF UTERUS     GANGLION CYST EXCISION Bilateral    GANGLION CYST EXCISION Right 10/08/2015   Procedure: REMOVAL GANGLION OF WRIST;  Surgeon: Hessie Knows, MD;  Location: [facility] OR[facility];  Service: Orthopedics;  Laterality: Right;   GASTRIC BYPASS     HAND SURGERY Bilateral Resection   HIP SURGERY     HYSTEROSCOPY WITH D & C N/A 05/24/2017   Procedure: DILATATION AND CURETTAGE /HYSTEROSCOPY;  Surgeon: Defrancesco, Alanda Slim, MD;  Location: [facility];  Service: Gynecology;  Laterality: N/A;   INCONTINENCE SURGERY  2009   LEEP N/A 05/24/2017   Procedure: LOOP ELECTROSURGICAL EXCISION PROCEDURE (LEEP);  Surgeon: Brayton Mars, MD;  Location: [facility];  Service: Gynecology;  Laterality: N/A;   TUBAL LIGATION      Allergies  Allergies  Allergen Reactions   Oxycodone-Acetaminophen Itching and Other (See Comments)    Reaction:  Dizziness and blurred vision    Other Other (See Comments)    Patient states she is allergic to all narcotics   Percocet  [Oxycodone-Acetaminophen] Nausea And Vomiting and Other (See Comments)    Reaction:  Dizziness and blurred vision  Reaction:  Dizziness and blurred vision     History of Present Illness    56 year old female with past medical history of OSA, symptomatic PVCs, anxiety/depression, fibromyalgia, hypertension, osteoarthritis, and rheumatoid arthritis.  She has no known history of diabetes, hyperlipidemia or premature coronary artery disease.  She does exhibit a treadmill stress test in 2016 for gastric bypass surgery which was normal.  She wore Holter monitor in December 2018 which showed very frequent PVCs with a total of 24,000 beats over 48 hours representing 12% burden.  Echocardiogram revealed normal LV systolic function with mild mitral regurgitation.  PVCs responded to diltiazem and metoprolol.  Repeat Holter monitor showed 5000 PVCs in 24 hours representing a 5% burden.  She was last seen in clinic by Dr. Fletcher Anon on 12/10/2020.  She reports significant improvement in her symptoms since her prior visit.  She denies chest pain or worsening dyspnea.  She recently started on CPAP for sleep apnea and was being worked up by neurology for tremors that she had started to have.  She had undergone an MRI which showed small scattered foci of T2/FL AIR hyperintense signal abnormality within the bilateral frontal lobe white matter.  There were nonspecific and most often secondary to chronic small vessel  ischemia.  Potential alternative considerations include sequela of chronic migraine headaches, sequela of a prior infectious/inflammatory process or sequela of demyelinating process (multiple sclerosis), among others.  Otherwise was unremarkable noncontrast MRI appearance of the brain.  She returns clinic today for concerns due to the inability to tolerate any ADHD medications because of her PVCs.  The other concern today is she has been having numbness and tingling down her bilateral upper extremities and headaches  with decreased sensation to her bilateral lower extremities.  She has undergone a EEG for this as well and states that she was advised that the EEG was normal they were ruling out epilepsy which she states she does not currently have.  She states that she has been off of her blood thinners for approximately 2 months now after having bilateral knee replacements completed.  She does continue to have palpitations primarily in the evening but denies any chest pain or shortness of breath.  She also denies any recent hospitalizations or visits to the emergency department.  Home Medications    Current Outpatient Medications  Medication Sig Dispense Refill   acetaminophen (TYLENOL) 500 MG tablet Take 1,500 mg by mouth daily as needed for mild pain.     busPIRone (BUSPAR) 5 MG tablet Take 1 tablet (5 mg total) by mouth 2 (two) times daily as needed. 180 tablet 4   citalopram (CELEXA) 40 MG tablet Take 1 tablet (40 mg total) by mouth daily. 90 tablet 4   dicyclomine (BENTYL) 20 MG tablet Take 1 tablet (20 mg total) by mouth 3 (three) times daily. (Patient taking differently: Take 20 mg by mouth daily as needed.) 270 tablet 1   diltiazem (CARDIZEM CD) 240 MG 24 hr capsule TAKE 1 CAPSULE BY MOUTH EVERY DAY 90 capsule 3   gabapentin (NEURONTIN) 100 MG capsule Take 2 capsules (200 mg total) by mouth 2 (two) times daily. (Patient taking differently: Take 300 mg by mouth 2 (two) times daily.) 270 capsule 4   gabapentin (NEURONTIN) 100 MG capsule Take 300 mg by mouth 2 (two) times daily.     Semaglutide-Weight Management (WEGOVY) 2.4 MG/0.75ML SOAJ Inject 2.4 mg into the skin once a week. 3 mL 4   Vitamin D, Ergocalciferol, (DRISDOL) 1.25 MG (50000 UNIT) CAPS capsule Take 1 capsule (50,000 Units total) by mouth once a week. 24 capsule 4   ASPIRIN LOW DOSE 81 MG EC tablet Take by mouth. (Patient not taking: Reported on 12/12/2021)     naloxone [facility]) nasal spray 4 mg/0.1 mL SMARTSIG:1 Spray(s) Both Nares Daily PRN  (Patient not taking: Reported on 12/12/2021)     Current Facility-Administered Medications  Medication Dose Route Frequency Provider Last Rate Last Admin   cyanocobalamin (VITAMIN B12) injection 1,000 mcg  1,000 mcg Intramuscular Q14 Days Marnee Guarneri T, NP   1,000 mcg at 11/18/21 1639     Family History    Family History  Problem Relation Age of Onset   Hypertension Mother    Depression Mother    Anxiety disorder Mother    Obesity Mother    COPD Father    Hypertension Father    Depression Father    Anxiety disorder Father    Obesity Father    Thyroid disease Daughter    Heart disease Maternal Grandfather    Diabetes Paternal Grandfather    Heart disease Paternal Grandfather    Eczema Son    Breast cancer Neg Hx    She indicated that her mother is alive. She indicated  that her father is alive. She indicated that her maternal grandmother is deceased. She indicated that her maternal grandfather is deceased. She indicated that her paternal grandmother is deceased. She indicated that her paternal grandfather is deceased. She indicated that her daughter is alive. She indicated that her son is alive. She indicated that the status of her neg hx is unknown.  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  Tobacco Use   Smoking status: Former    Packs/day: 0.50    Years: 29.00    Total pack years: 14.50    Types: Cigarettes    Quit date: 01/29/2015    Years since quitting: 6.8   Smokeless tobacco: Never  Vaping Use   Vaping Use: Never used  Substance and Sexual Activity   Alcohol use: No    Alcohol/week: 0.0 standard drinks of alcohol   Drug use: No   Sexual activity: Yes  Other Topics Concern   Not on file  Social History Narrative   Not on file   Social  Determinants of Health   Financial Resource Strain: Not on file  Food Insecurity: Not on file  Transportation Needs: Not on file  Physical Activity: Not on file  Stress: Not on file  Social Connections: Not on file  Intimate Partner Violence: Not on file     Review of Systems    General:  No chills, fever, night sweats or weight changes.  She endorses fatigue Cardiovascular:  No chest pain, dyspnea on exertion, edema, orthopnea, paroxysmal nocturnal dyspnea, endorses palpitations Dermatological: No rash, lesions/masses Respiratory: No cough, dyspnea Urologic: No hematuria, dysuria Abdominal:   No nausea, vomiting, diarrhea, bright red blood per rectum, melena, or hematemesis Neurologic:  No visual changes, wkns, changes in mental status, endorses headache with numbness and tingling to arms without visual changes All other systems reviewed and are otherwise negative except as noted above.     Physical Exam    VS:  BP 110/80 (BP Location: Left Arm, Patient Position: Sitting, Cuff Size: Large)   Pulse 73   Ht 5' 6.5" (1.689 m)   Wt 260 lb (117.9 kg)   LMP 02/09/2017 (Within Weeks)   SpO2 98%   BMI 41.34 kg/m  , BMI Body mass index is 41.34 kg/m.     GEN: Well nourished, well developed, in no acute distress. HEENT: normal.  Glasses on. Neck: Supple, no JVD, carotid bruits, or masses. Cardiac: RRR with noted premature beats on auscultation, no murmurs, rubs, or gallops. No clubbing, cyanosis, trace bilateral lower extremity edema.  Radials/DP/PT 2+ and equal bilaterally.  Respiratory:  Respirations regular and unlabored, clear to auscultation bilaterally. GI: Soft, nontender, nondistended, BS + x 4. MS: no deformity or atrophy. Skin: warm and dry, no rash. Neuro:  Strength and sensation are intact. Psych: Normal affect.  Accessory Clinical Findings    ECG personally reviewed by me today-sinus rhythm with a rate of 73, left axis deviation, left atrial enlargement- No acute  changes  Lab Results  Component Value Date   WBC 7.1 07/23/2020   HGB 13.1 07/23/2020   HCT 41 07/23/2020   MCV 88.6 03/08/2018   PLT 373 07/23/2020   Lab Results  Component Value Date  CREATININE 0.8 07/23/2020   BUN 7 07/23/2020   NA 138 07/23/2020   K 4.2 07/23/2020   CL 99 07/23/2020   CO2 25 (A) 07/23/2020   Lab Results  Component Value Date   ALT 13 03/05/2020   AST 12 (A) 03/05/2020   ALKPHOS 107 03/05/2020   BILITOT 0.5 03/08/2018   Lab Results  Component Value Date   CHOL 160 12/12/2021   HDL 60 12/12/2021   LDLCALC 83 12/12/2021   TRIG 87 12/12/2021   CHOLHDL 2.7 12/12/2021    Lab Results  Component Value Date   HGBA1C 5.5 07/23/2020    Assessment & Plan   1.  Symptomatic PVCs with palpitations and intolerance to stimulant and nonstimulant medications for ADHD.  No PVCs are noted on her twelve-lead EKG that was done today but on auscultation she is have irregular beats noted. She has been continued on diltiazem 240 mg daily.  She recently wore a long-term monitor which showed an improved PVC burden of 2.9% that was previously 5% in the past.  She states that her PVCs are worse in the evening but she has episodes of the increasing them but they have been improved lately.  We did discuss increasing her diltiazem to 360 mg a daily if her symptoms worsen.  2.  Headache without visual changes but associated numbness and tingling down the bilateral upper extremities.  She has been scheduled for a carotid duplex.  She is also being sent for a lipid panel as her total cholesterol and LDL are unknown at this time.  MRI and EEG were normal per the patient that she had had done by her neurologist and PCP.  3.  Sleep apnea with CPAP usage.  4.  Disposition: Patient return to clinic to see MD/APP in 4 weeks or sooner if needed to follow-up on outpatient testing and blood work as well as symptoms Lara Palinkas, NP 12/12/2021, 10:52 AM

## 2021-12-11 ENCOUNTER — Ambulatory Visit: Payer: Self-pay | Admitting: Psychiatry

## 2021-12-12 ENCOUNTER — Encounter: Payer: Self-pay | Admitting: Cardiology

## 2021-12-12 ENCOUNTER — Other Ambulatory Visit
Admission: RE | Admit: 2021-12-12 | Discharge: 2021-12-12 | Disposition: A | Discharge status: Home / Self Care | Payer: BC Managed Care – PPO | Attending: Cardiology | Admitting: Cardiology

## 2021-12-12 ENCOUNTER — Ambulatory Visit: Payer: BC Managed Care – PPO | Attending: Cardiology | Admitting: Cardiology

## 2021-12-12 VITALS — BP 110/80 | HR 73 | Ht 66.5 in | Wt 260.0 lb

## 2021-12-12 DIAGNOSIS — G4730 Sleep apnea, unspecified: Secondary | ICD-10-CM

## 2021-12-12 DIAGNOSIS — Z6841 Body Mass Index (BMI) 40.0 and over, adult: Secondary | ICD-10-CM | POA: Diagnosis not present

## 2021-12-12 DIAGNOSIS — E669 Obesity, unspecified: Secondary | ICD-10-CM | POA: Diagnosis not present

## 2021-12-12 DIAGNOSIS — I493 Ventricular premature depolarization: Secondary | ICD-10-CM | POA: Diagnosis not present

## 2021-12-12 DIAGNOSIS — R519 Headache, unspecified: Secondary | ICD-10-CM

## 2021-12-12 LAB — LIPID PANEL
Cholesterol: 160 mg/dL (ref 0–200)
HDL: 60 mg/dL (ref >40)
LDL Cholesterol: 83 mg/dL (ref 0–99)
Total CHOL/HDL Ratio: 2.7 RATIO
Triglycerides: 87 mg/dL (ref <150)
VLDL: 17 mg/dL (ref 0–40)

## 2021-12-12 NOTE — Patient Instructions (Signed)
Medication Instructions:  Your physician recommends that you continue on your current medications as directed. Please refer to the Current Medication list given to you today.  *If you need a refill on your cardiac medications before your next appointment, please call your pharmacy*   Lab Work: Your physician recommends that you return for a FASTING lipid profile today.  If you have labs (blood work) drawn today and your tests are completely normal, you will receive your results only by: [place] (if you have MyChart) OR A paper copy in the mail If you have any lab test that is abnormal or we need to change your treatment, we will call you to review the results.   Testing/Procedures: Your physician has requested that you have a carotid duplex. This test is an ultrasound of the carotid arteries in your neck. It looks at blood flow through these arteries that supply the brain with blood. Allow one hour for this exam. There are no restrictions or special instructions.   Follow-Up: At [facility], you and your health needs are our priority.  As part of our continuing mission to provide you with exceptional heart care, we have created designated Provider Care Teams.  These Care Teams include your primary Cardiologist (physician) and Advanced Practice Providers (APPs -  Physician Assistants and Nurse Practitioners) who all work together to provide you with the care you need, when you need it.  We recommend signing up for the patient portal called "MyChart".  Sign up information is provided on this After Visit Summary.  MyChart is used to connect with patients for Virtual Visits (Telemedicine).  Patients are able to view lab/test results, encounter notes, upcoming appointments, etc.  Non-urgent messages can be sent to your provider as well.   To learn more about what you can do with MyChart, go to [email or web address].    Your next appointment:   1 month(s)  The format for  your next appointment:   In Person  Provider:   You will see one of the following Advanced Practice Providers on your designated Care Team:   Gerrie Nordmann, NP      Other Instructions   Important Information About Sugar

## 2021-12-12 NOTE — Progress Notes (Signed)
Blood work reassuring. No changes need to made to medications at this time. Keep follow up after carotid duplex.

## 2021-12-16 MED ORDER — WEGOVY 2.4 MG/0.75ML ~~LOC~~ SOAJ: 2.4000 mg | SUBCUTANEOUS | 4 refills | Status: DC

## 2021-12-22 ENCOUNTER — Telehealth: Payer: Self-pay | Admitting: Nurse Practitioner

## 2021-12-22 NOTE — Telephone Encounter (Signed)
Pts insurance called to advise that pts Wegovy needs PA/ she asked if this can be marked urgent / please advise

## 2021-12-23 ENCOUNTER — Telehealth: Payer: Self-pay

## 2021-12-23 NOTE — Telephone Encounter (Signed)
Reached out to patient via MyChart.  

## 2021-12-23 NOTE — Telephone Encounter (Signed)
Spoke with [place] from [employer] to initiate prior authorization was patient prescription of Wegovy 2.4 MG. Determination can take 3-5 business days. Patient was made aware.   EOC ID: [id number] Member ID: [id number]

## 2021-12-24 ENCOUNTER — Telehealth (INDEPENDENT_AMBULATORY_CARE_PROVIDER_SITE_OTHER): Payer: BC Managed Care – PPO | Admitting: Nurse Practitioner

## 2021-12-24 ENCOUNTER — Encounter: Payer: Self-pay | Admitting: Nurse Practitioner

## 2021-12-24 DIAGNOSIS — G629 Polyneuropathy, unspecified: Secondary | ICD-10-CM

## 2021-12-24 DIAGNOSIS — G259 Extrapyramidal and movement disorder, unspecified: Secondary | ICD-10-CM | POA: Diagnosis not present

## 2021-12-24 DIAGNOSIS — I493 Ventricular premature depolarization: Secondary | ICD-10-CM

## 2021-12-24 NOTE — Assessment & Plan Note (Signed)
Followed by [facility] at this time, continue this collaboration.  Recent notes and MRI reviewed.

## 2021-12-24 NOTE — Assessment & Plan Note (Signed)
Chronic, ongoing, worsened with addition of Tenex; however he tremors much improved.  She reports cardiology has told her she could go up on Diltiazem if she wishes to restart Tenex and this may help PVCs -- she reports they recommend she work on this with PCP.  Have advised her to reach out if any worsening and PCP will send secure chat to cardiology to check on this as do no see note for this.

## 2021-12-24 NOTE — Assessment & Plan Note (Signed)
Diagnosed May 2023.   Continue to collaborate with neurology at [facility], recent notes and labs reviewed.

## 2021-12-24 NOTE — Progress Notes (Signed)
LMP 02/09/2017 (Within Weeks)    Subjective:    Patient ID: Cathy Mitchell, female    DOB: 04-Jul-1965, 56 y.o.   MRN: [id number]  HPI: Cathy Mitchell is a 56 y.o. female  Chief Complaint  Patient presents with   Results    Patient is here to discuss recent test results. Patient says she would like to discuss medication to control her PCVs at today's visit. Patient says she was prescribed by Cardiologist.    This visit was completed via video visit through MyChart due to the restrictions of the COVID-19 pandemic. All issues as above were discussed and addressed. Physical exam was done as above through visual confirmation on video through MyChart. If it was felt that the patient should be evaluated in the office, they were directed there. The patient verbally consented to this visit. Location of the patient: home Location of the provider: work Those involved with this call:  Provider: Marnee Guarneri, DNP CMA: Irena Reichmann, [place] Desk/Registration: [employer]  Time spent on call:  21 minutes with patient face to face via video conference. More than 50% of this time was spent in counseling and coordination of care. 15 minutes total spent in review of patient's record and preparation of their chart.  I verified patient identity using two factors (patient name and date of birth). Patient consents verbally to being seen via telemedicine visit today.    PVCs Reports that recent visit with cardiology she was told her PVCs were back.  They did discuss medication with her.  The PVCs increase was brought on, per her report, from a medication prescribed by neurology (Tenex) for her tremors -- the tremors improved with medication but PVCs increased.  Cardiology recommend to her trying to go up on Diltiazem, which she wanted to talk to PCP about.  She wishes to restart Tenex, which cardiology stated if PVC increase then need to increase Diltiazem (due to interactions between two).  Aspirin:  no Recurrent headaches: no Visual changes: no Palpitations: no Dyspnea: no Chest pain: no Lower extremity edema: no Dizzy/lightheaded: no   Relevant past medical, surgical, family and social history reviewed and updated as indicated. Interim medical history since our last visit reviewed. Allergies and medications reviewed and updated.  Review of Systems  Constitutional:  Negative for activity change, appetite change, diaphoresis, fatigue and fever.  Respiratory:  Negative for cough, chest tightness and shortness of breath.   Cardiovascular:  Negative for chest pain, palpitations and leg swelling.  Neurological: Negative.   Psychiatric/Behavioral: Negative.      Per HPI unless specifically indicated above     Objective:    LMP 02/09/2017 (Within Weeks)   Wt Readings from Last 3 Encounters:  12/12/21 260 lb (117.9 kg)  11/18/21 260 lb 12.8 oz (118.3 kg)  09/26/21 254 lb 9.6 oz (115.5 kg)    Physical Exam Vitals and nursing note reviewed.  Constitutional:      General: She is awake. She is not in acute distress.    Appearance: She is well-developed. She is not ill-appearing.  HENT:     Head: Normocephalic.     Right Ear: Hearing normal.     Left Ear: Hearing normal.  Eyes:     General: Lids are normal.        Right eye: No discharge.        Left eye: No discharge.     Conjunctiva/sclera: Conjunctivae normal.  Pulmonary:     Effort: Pulmonary effort  is normal. No accessory muscle usage or respiratory distress.  Musculoskeletal:     Cervical back: Normal range of motion.  Neurological:     Mental Status: She is alert and oriented to person, place, and time.  Psychiatric:        Attention and Perception: Attention normal.        Mood and Affect: Mood normal.        Behavior: Behavior normal. Behavior is cooperative.        Thought Content: Thought content normal.        Judgment: Judgment normal.    Results for orders placed or performed during the hospital  encounter of 12/12/21  Lipid panel  Result Value Ref Range   Cholesterol 160 0 - 200 mg/dL   Triglycerides 87 <150 mg/dL   HDL 60 >40 mg/dL   Total CHOL/HDL Ratio 2.7 RATIO   VLDL 17 0 - 40 mg/dL   LDL Cholesterol 83 0 - 99 mg/dL      Assessment & Plan:   Problem List Items Addressed This Visit       Cardiovascular and Mediastinum   PVC's (premature ventricular contractions) - Primary    Chronic, ongoing, worsened with addition of Tenex; however he tremors much improved.  She reports cardiology has told her she could go up on Diltiazem if she wishes to restart Tenex and this may help PVCs -- she reports they recommend she work on this with PCP.  Have advised her to reach out if any worsening and PCP will send secure chat to cardiology to check on this as do no see note for this.      Relevant Medications   guanFACINE (TENEX) 1 MG tablet     Nervous and Auditory   Functional movement disorder    Followed by [facility] neurology at this time, continue this collaboration.  Recent notes and MRI reviewed.       Small fiber neuropathy    Diagnosed May 2023.   Continue to collaborate with neurology at [facility], recent notes and labs reviewed.       I discussed the assessment and treatment plan with the patient. The patient was provided an opportunity to ask questions and all were answered. The patient agreed with the plan and demonstrated an understanding of the instructions.   The patient was advised to call back or seek an in-person evaluation if the symptoms worsen or if the condition fails to improve as anticipated.   I provided 21+ minutes of time during this encounter.    Follow up plan: Return for as scheduled January 2nd.

## 2021-12-24 NOTE — Patient Instructions (Signed)
Palpitations Palpitations are feelings that your heartbeat is not normal. Your heartbeat may feel like it is: Uneven (irregular). Faster than normal. Fluttering. Skipping a beat. This is usually not a serious problem. However, a doctor will do tests and check your medical history to make sure that you do not have a serious heart problem. Follow these instructions at home: Watch for any changes in your condition. Tell your doctor about any changes. Take these actions to help manage your symptoms: Eating and drinking Follow instructions from your doctor about things to eat and drink. You may be told to avoid these things: Drinks that have caffeine in them, such as coffee, tea, soft drinks, and energy drinks. Chocolate. Alcohol. Diet pills. Lifestyle     Try to lower your stress. These things can help you relax: Yoga. Deep breathing and meditation. Guided imagery. This is using words and images to create positive thoughts. Exercise, including swimming, jogging, and walking. Tell your doctor if you have more abnormal heartbeats when you are active. If you have chest pain or feel short of breath with exercise, do not keep doing the exercise until you are seen by your doctor. Biofeedback. This is using your mind to control things in your body, such as your heartbeat. Get plenty of rest and sleep. Keep a regular bed time. Do not use drugs, such as cocaine or ecstasy. Do not use marijuana. Do not smoke or use any products that contain nicotine or tobacco. If you need help quitting, ask your doctor. General instructions Take over-the-counter and prescription medicines only as told by your doctor. Keep all follow-up visits. You may need more tests if palpitations do not go away or get worse. Contact a doctor if: You keep having fast or uneven heartbeats for a long time. Your symptoms happen more often. Get help right away if: You have chest pain. You feel short of breath. You have a very  bad headache. You feel dizzy. You faint. These symptoms may be an emergency. Get help right away. Call your local emergency services (911 in the [place] Do not wait to see if the symptoms will go away. Do not drive yourself to the hospital. Summary Palpitations are feelings that your heartbeat is uneven or faster than normal. It may feel like your heart is fluttering or skipping a beat. Avoid food and drinks that may cause this condition. These include caffeine, chocolate, and alcohol. Try to lower your stress. Do not smoke or use drugs. Get help right away if you faint, feel dizzy, feel short of breath, have chest pain, or have a very bad headache. This information is not intended to replace advice given to you by your health care provider. Make sure you discuss any questions you have with your health care provider. Document Revised: 08/07/2020 Document Reviewed: 08/07/2020 Elsevier Patient Education  2023 [employer] Inc.  

## 2021-12-26 ENCOUNTER — Encounter: Payer: Self-pay | Admitting: Nurse Practitioner

## 2021-12-27 ENCOUNTER — Encounter: Payer: Self-pay | Admitting: Nurse Practitioner

## 2021-12-30 ENCOUNTER — Encounter: Payer: Self-pay | Admitting: Nurse Practitioner

## 2022-01-01 ENCOUNTER — Ambulatory Visit: Payer: BC Managed Care – PPO | Attending: Cardiology

## 2022-01-01 ENCOUNTER — Ambulatory Visit: Payer: BC Managed Care – PPO | Admitting: Cardiovascular Disease

## 2022-01-01 DIAGNOSIS — R519 Headache, unspecified: Secondary | ICD-10-CM | POA: Diagnosis not present

## 2022-01-05 NOTE — Progress Notes (Signed)
Please let Cathy Mitchell know that there was no blockages noted in her carotid arteries.Thanks.

## 2022-01-14 ENCOUNTER — Ambulatory Visit: Payer: BC Managed Care – PPO | Attending: Cardiology | Admitting: Cardiology

## 2022-01-14 ENCOUNTER — Encounter: Payer: Self-pay | Admitting: Cardiology

## 2022-01-14 VITALS — BP 100/60 | HR 78 | Ht 68.0 in | Wt 262.5 lb

## 2022-01-14 DIAGNOSIS — H60543 Acute eczematoid otitis externa, bilateral: Secondary | ICD-10-CM | POA: Diagnosis not present

## 2022-01-14 DIAGNOSIS — G4730 Sleep apnea, unspecified: Secondary | ICD-10-CM

## 2022-01-14 DIAGNOSIS — R002 Palpitations: Secondary | ICD-10-CM

## 2022-01-14 DIAGNOSIS — H93293 Other abnormal auditory perceptions, bilateral: Secondary | ICD-10-CM | POA: Diagnosis not present

## 2022-01-14 DIAGNOSIS — R519 Headache, unspecified: Secondary | ICD-10-CM

## 2022-01-14 DIAGNOSIS — I493 Ventricular premature depolarization: Secondary | ICD-10-CM | POA: Diagnosis not present

## 2022-01-14 MED ORDER — DILTIAZEM HCL ER COATED BEADS 120 MG PO CP24: 120.0000 mg | ORAL_CAPSULE | Freq: Two times a day (BID) | ORAL | 3 refills | Status: DC

## 2022-01-14 NOTE — Progress Notes (Signed)
Cardiology Clinic Note   Patient Name: Cathy Mitchell Date of Encounter: 01/14/2022  Primary Care Provider:  Venita Lick, NP Primary Cardiologist:  Kathlyn Sacramento, MD  Patient Profile    56 year old female with past medical history of OSA, symptomatic PVCs, anxiety/depression, fibromyalgia, hypertension, osteoarthritis, rheumatoid arthritis, Tourette's, recently diagnosed eczema to the bilateral ears, who is here today for follow-up on his symptomatic PVCs.  Past Medical History    Past Medical History:  Diagnosis Date   Anxiety    Arthritis    Back pain    Dementia ([place])    Depression    Fibromyalgia    Food allergy    [place]   Hypertension    no longer has hypertension   Joint pain    Kidney problem    Neuromuscular disorder ([place])    L-sided tremors   Osteoarthritis    Other fatigue    Rheumatoid arthritis ([place])    Sleep apnea    does not use C-PAP   Past Surgical History:  Procedure Laterality Date   CARPAL TUNNEL RELEASE Bilateral 2007   DILATION AND CURETTAGE OF UTERUS     GANGLION CYST EXCISION Bilateral    GANGLION CYST EXCISION Right 10/08/2015   Procedure: REMOVAL GANGLION OF WRIST;  Surgeon: Hessie Knows, MD;  Location: [facility];  Service: Orthopedics;  Laterality: Right;   GASTRIC BYPASS     HAND SURGERY Bilateral Resection   HIP SURGERY     HYSTEROSCOPY WITH D & C N/A 05/24/2017   Procedure: DILATATION AND CURETTAGE /HYSTEROSCOPY;  Surgeon: Defrancesco, Alanda Slim, MD;  Location: [facility];  Service: Gynecology;  Laterality: N/A;   INCONTINENCE SURGERY  2009   LEEP N/A 05/24/2017   Procedure: LOOP ELECTROSURGICAL EXCISION PROCEDURE (LEEP);  Surgeon: Brayton Mars, MD;  Location: [facility];  Service: Gynecology;  Laterality: N/A;   TUBAL LIGATION      Allergies  Allergies  Allergen Reactions   Oxycodone-Acetaminophen Itching and Other (See Comments)    Reaction:  Dizziness and blurred vision    Other Other (See Comments)     Patient states she is allergic to all narcotics   Percocet [Oxycodone-Acetaminophen] Nausea And Vomiting and Other (See Comments)    Reaction:  Dizziness and blurred vision  Reaction:  Dizziness and blurred vision     History of Present Illness    Cathy Mitchell is a 56 year old female with complex medical history of obstructive sleep apnea, symptomatic PVCs, anxiety/depression, fibromyalgia, hypertension, osteoarthritis, rheumatoid arthritis, Tourette's, and recently been diagnosed with eczema to the ears.  She has no known history of diabetes, hyperlipidemia or premature coronary artery disease.  She did undergo treadmill stress testing in 2016 for gastric bypass surgery which was normal.  She wore Holter monitor in December 2018 which is a very frequent PVCs with a total of 24,000 beats over 48 hours representing 12% burden.  Echocardiogram revealed normal LV systolic function with mild mitral regurgitation, PVCs responded to diltiazem and metoprolol.  Repeat Holter monitor showed 5000 PVCs in 24 hours representing a 5% burden.  She was seen in clinic by Dr. Saunders Revel in 12/16/2020 reports significant improvement in her symptoms since her prior visit.  She had no complaints of chest pain or worsening dyspnea after recently starting on CPAP for sleep apnea and being worked up by neurology for tremors.  She had undergone MRI which showed small scattered foci of T2/FLAIR hyperintense signal abnormality within the bilateral frontal lobe white matter.  They were nonspecific and most often secondary to chronic small vessel ischemia.  Potential alternative considerations include sequela of chronic migraine headaches, sequela of prior infectious/inflammatory process or sequela of demyelinating process (multiple sclerosis) among others.  Otherwise was unremarkable noncontrast MRI appearance of the brain.  She was last seen in clinic 12/12/2021 with concerns with inability to tolerate any ADHD medication because of  her PVCs.  Concern had she been having numbness and tingling down her bilateral upper extremities and headaches with decreased sensation in bilateral lower extremities.  She did undergo an EEG that was normal and epilepsy had been ruled out.  She stated she had been off blood thinners approximately 2 months, she did continue to have complaints of palpitations primarily in the evening but denied any chest pain or shortness of breath.  She returns to clinic today with continued complaints of palpitations primarily in the evening.  She has back on ADHD and medication as well.  She states that one of the side effects of the medication that she is on is irregular heartbeats and palpitations which she has suffering and would like to try and change her diltiazem dosing to see if that helps with some of the side effects that she is experiencing from her ADHD medications.  She denies any chest pain, shortness of breath, dyspnea on exertion, peripheral edema.  She also denies any recent hospitalizations or visits to the emergency department. Home Medications    Current Outpatient Medications  Medication Sig Dispense Refill   acetaminophen (TYLENOL) 500 MG tablet Take 1,500 mg by mouth daily as needed for mild pain.     busPIRone (BUSPAR) 5 MG tablet Take 1 tablet (5 mg total) by mouth 2 (two) times daily as needed. 180 tablet 4   citalopram (CELEXA) 40 MG tablet Take 1 tablet (40 mg total) by mouth daily. 90 tablet 4   dicyclomine (BENTYL) 20 MG tablet Take 1 tablet (20 mg total) by mouth 3 (three) times daily. (Patient taking differently: Take 20 mg by mouth daily as needed.) 270 tablet 1   diltiazem (CARDIZEM CD) 120 MG 24 hr capsule Take 1 capsule (120 mg total) by mouth 2 (two) times daily. 60 capsule 3   gabapentin (NEURONTIN) 100 MG capsule Take 300 mg by mouth 2 (two) times daily.     guanFACINE (TENEX) 1 MG tablet Take 0.5 mg by mouth in the morning and at bedtime.     naloxone (NARCAN) nasal spray 4  mg/0.1 mL      Semaglutide-Weight Management (WEGOVY) 2.4 MG/0.75ML SOAJ Inject 2.4 mg into the skin once a week. 3 mL 4   Vitamin D, Ergocalciferol, (DRISDOL) 1.25 MG (50000 UNIT) CAPS capsule Take 1 capsule (50,000 Units total) by mouth once a week. 24 capsule 4   Current Facility-Administered Medications  Medication Dose Route Frequency Provider Last Rate Last Admin   cyanocobalamin (VITAMIN B12) injection 1,000 mcg  1,000 mcg Intramuscular Q14 Days Cannady, Jolene T, NP   1,000 mcg at 11/18/21 1639     Family History    Family History  Problem Relation Age of Onset   Hypertension Mother    Depression Mother    Anxiety disorder Mother    Obesity Mother    COPD Father    Hypertension Father    Depression Father    Anxiety disorder Father    Obesity Father    Thyroid disease Daughter    Heart disease Maternal Grandfather    Diabetes Paternal Grandfather  Heart disease Paternal Grandfather    Eczema Son    Breast cancer Neg Hx    She indicated that her mother is alive. She indicated that her father is alive. She indicated that her maternal grandmother is deceased. She indicated that her maternal grandfather is deceased. She indicated that her paternal grandmother is deceased. She indicated that her paternal grandfather is deceased. She indicated that her daughter is alive. She indicated that her son is alive. She indicated that the status of her neg hx is unknown.  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  Tobacco Use   Smoking status: Former    Packs/day: 0.50    Years: 29.00    Total pack years: 14.50    Types: Cigarettes    Quit date: 01/29/2015    Years since quitting: 6.9   Smokeless tobacco: Never  Vaping Use   Vaping Use: Never used   Substance and Sexual Activity   Alcohol use: No    Alcohol/week: 0.0 standard drinks of alcohol   Drug use: No   Sexual activity: Yes  Other Topics Concern   Not on file  Social History Narrative   Not on file   Social Determinants of Health   Financial Resource Strain: Not on file  Food Insecurity: Not on file  Transportation Needs: Not on file  Physical Activity: Not on file  Stress: Not on file  Social Connections: Not on file  Intimate Partner Violence: Not on file     Review of Systems    General:  No chills, fever, night sweats or weight changes.  Endorses fatigue Cardiovascular:  No chest pain, dyspnea on exertion, edema, orthopnea, endorses palpitations, paroxysmal nocturnal dyspnea. Dermatological: No rash, lesions/masses Respiratory: No cough, dyspnea Urologic: No hematuria, dysuria Abdominal:   No nausea, vomiting, diarrhea, bright red blood per rectum, melena, or hematemesis Neurologic:  No visual changes, wkns, changes in mental status. All other systems reviewed and are otherwise negative except as noted above.   Physical Exam    VS:  BP 100/60 (BP Location: Left Arm, Patient Position: Sitting, Cuff Size: Large)   Pulse 78   Ht '5\' 8"'$  (1.727 m)   Wt 262 lb 8 oz (119.1 kg)   LMP 02/09/2017 (Within Weeks)   SpO2 98%   BMI 39.91 kg/m  , BMI Body mass index is 39.91 kg/m.     GEN: Well nourished, well developed, in no acute distress. HEENT: normal. Glasses on. Neck: Supple, no JVD, carotid bruits, or masses. Cardiac: RRR, no murmurs, rubs, or gallops. No clubbing, cyanosis, trace edema.  Radials/DP/PT 2+ and equal bilaterally.  Respiratory:  Respirations regular and unlabored, clear to auscultation bilaterally. GI: Soft, nontender, nondistended, BS + x 4. MS: no deformity or atrophy. Skin: warm and dry, no rash. Neuro:  Strength and sensation are intact. Psych: Normal affect.  Accessory Clinical Findings    ECG personally reviewed by me today-sinus  rhythm rate of 78, left axis deviation, unifocal PVC- No acute changes  Lab Results  Component Value Date   WBC 7.1 07/23/2020   HGB 13.1 07/23/2020   HCT 41 07/23/2020   MCV 88.6 03/08/2018   PLT 373 07/23/2020   Lab Results  Component Value  Date   CREATININE 0.8 07/23/2020   BUN 7 07/23/2020   NA 138 07/23/2020   K 4.2 07/23/2020   CL 99 07/23/2020   CO2 25 (A) 07/23/2020   Lab Results  Component Value Date   ALT 13 03/05/2020   AST 12 (A) 03/05/2020   ALKPHOS 107 03/05/2020   BILITOT 0.5 03/08/2018   Lab Results  Component Value Date   CHOL 160 12/12/2021   HDL 60 12/12/2021   LDLCALC 83 12/12/2021   TRIG 87 12/12/2021   CHOLHDL 2.7 12/12/2021    Lab Results  Component Value Date   HGBA1C 5.5 07/23/2020    Assessment & Plan   1.  Symptomatic PVCs with palpitations.  She has a history of intolerance to stimulant and nonstimulant medications for ADHD.  She has recently been started on another medication because of increased amount of PVCs that she is questioning today about changing her diltiazem dosing.  She currently takes diltiazem 240 mg daily she would like to take 120 mg twice daily when she takes her ADHD medication just to see for herself if it decreases the amount of palpitations that she is feeling.  Previously we had discussed increasing her diltiazem dosing up from 240 mg daily to 360 mg daily which is still an option if needed if her twice daily dosing does not offer improvement in symptoms.  She did have PVC noted on EKG today.  She has been sent in prescription for diltiazem 120 mg to take twice daily [facility] [place] per request.  2.  Recurrent headaches without visual changes associated numbness and tingling to the upper extremities and lower extremities.  She completed a carotid duplex which was normal study.  She has been sent to follow-up at [facility] with neurology/neurosurgery on the recommendation of her neurologist to determine if she would benefit  from possible shunt.  3.  Sleep apnea when she is now using CPAP machine.   4.  Disposition patient return to clinic to see MD/APP in 2 months or sooner if needed  Renada Cronin, NP 01/14/2022, 3:34 PM

## 2022-01-14 NOTE — Patient Instructions (Signed)
Medication Instructions:   Your physician has recommended you make the following change in your medication:    Change the way you take your Diltiazem ER as 120 MG twice a day. I [facility]   [facility] [place].  *If you need a refill on your cardiac medications before your next appointment, please call your pharmacy*    Follow-Up: At [facility], you and your health needs are our priority.  As part of our continuing mission to provide you with exceptional heart care, we have created designated Provider Care Teams.  These Care Teams include your primary Cardiologist (physician) and Advanced Practice Providers (APPs -  Physician Assistants and Nurse Practitioners) who all work together to provide you with the care you need, when you need it.  We recommend signing up for the patient portal called "MyChart".  Sign up information is provided on this After Visit Summary.  MyChart is used to connect with patients for Virtual Visits (Telemedicine).  Patients are able to view lab/test results, encounter notes, upcoming appointments, etc.  Non-urgent messages can be sent to your provider as well.   To learn more about what you can do with MyChart, go to [email or web address].    Your next appointment:   2 month(s)  The format for your next appointment:   In Person  Provider:   You may see Kathlyn Sacramento, MD or one of the following Advanced Practice Providers on your designated Care Team:   Murray Hodgkins, NP Christell Faith, PA-C Cadence Kathlen Mody, PA-C Gerrie Nordmann, NP    Other Instructions   Important Information About Sugar

## 2022-01-19 MED ORDER — CELECOXIB 100 MG PO CAPS: 100.0000 mg | ORAL_CAPSULE | Freq: Every day | ORAL | 0 refills | Status: DC | PRN

## 2022-02-13 ENCOUNTER — Encounter: Payer: Self-pay | Admitting: Nurse Practitioner

## 2022-02-13 ENCOUNTER — Ambulatory Visit: Payer: BC Managed Care – PPO | Admitting: Nurse Practitioner

## 2022-02-13 VITALS — BP 104/69 | HR 74 | Temp 97.6°F | Ht 67.99 in | Wt 260.6 lb

## 2022-02-13 DIAGNOSIS — R5383 Other fatigue: Secondary | ICD-10-CM | POA: Insufficient documentation

## 2022-02-13 DIAGNOSIS — F331 Major depressive disorder, recurrent, moderate: Secondary | ICD-10-CM

## 2022-02-13 DIAGNOSIS — Z23 Encounter for immunization: Secondary | ICD-10-CM

## 2022-02-13 DIAGNOSIS — E538 Deficiency of other specified B group vitamins: Secondary | ICD-10-CM | POA: Diagnosis not present

## 2022-02-13 DIAGNOSIS — E010 Iodine-deficiency related diffuse (endemic) goiter: Secondary | ICD-10-CM | POA: Insufficient documentation

## 2022-02-13 DIAGNOSIS — F418 Other specified anxiety disorders: Secondary | ICD-10-CM

## 2022-02-13 DIAGNOSIS — E6609 Other obesity due to excess calories: Secondary | ICD-10-CM

## 2022-02-13 DIAGNOSIS — E041 Nontoxic single thyroid nodule: Secondary | ICD-10-CM

## 2022-02-13 DIAGNOSIS — F32A Depression, unspecified: Secondary | ICD-10-CM | POA: Diagnosis not present

## 2022-02-13 DIAGNOSIS — Z6839 Body mass index (BMI) 39.0-39.9, adult: Secondary | ICD-10-CM

## 2022-02-13 MED ORDER — GABAPENTIN 300 MG PO CAPS: 300.0000 mg | ORAL_CAPSULE | Freq: Two times a day (BID) | ORAL | 4 refills | Status: DC

## 2022-02-13 NOTE — Addendum Note (Signed)
Addended by: Marnee Guarneri T on: 02/13/2022 12:20 PM   Modules accepted: Orders

## 2022-02-13 NOTE — Assessment & Plan Note (Signed)
Chronic, ongoing.  Continue injections of B12 in office, labs today.

## 2022-02-13 NOTE — Progress Notes (Signed)
BP 104/69   Pulse 74   Temp 97.6 F (36.4 C) (Oral)   Ht 5' 7.99" (1.727 m)   Wt 260 lb 9.6 oz (118.2 kg)   LMP 02/09/2017 (Within Weeks)   SpO2 97%   BMI 39.63 kg/m    Subjective:    Patient ID: Cathy Mitchell, female    DOB: [birth year], 56 y.o.   MRN: [id number]  HPI: Cathy Mitchell is a 56 y.o. female  Chief Complaint  Patient presents with   Depression   DEPRESSION Saw Dr. Nicolasa Ducking in past with last visit 04/23/21 -- did not feel she had a good relationship with her.  She came off of all mood medications -- had been on Celexa and Gabapentin (for mood and neuropathy) -- continues both of these + had trials of multiple other medications with them.   She is concerned she has Hashimoto's thyroid issues as her neck has been swollen lately with pain and she has all the symptoms she read about online.  Currently lots of stressors with her son's health -- having increased anxiety -- has drive but can not do it.  Has ADHD, took multiple medications with psych in past but these made her feel worse.   Was working with therapist -- Doreene Burke, via Dr. Nicolasa Ducking office as well -- for PTSD, but stopped seeing therapy. Mood status: stable Satisfied with current treatment?: yes Symptom severity: moderate  Duration of current treatment : chronic Side effects: no Medication compliance: good compliance Psychotherapy/counseling: yes in the past Previous psychiatric medications: multiple medications Depressed mood: yes Anxious mood: yes Anhedonia: yes Significant weight loss or gain: no Insomnia: yes hard to stay asleep Fatigue: yes Feelings of worthlessness or guilt: no Impaired concentration/indecisiveness: yes Suicidal ideations: no Hopelessness: no Crying spells: yes    02/13/2022    8:35 AM 11/18/2021    4:04 PM 09/26/2021    1:27 PM 06/26/2021   11:24 AM 05/23/2021   11:43 AM  Depression screen PHQ 2/9  Decreased Interest 0 '1 2 2 1  '$ Down, Depressed, Hopeless 0 '1 2 3 '$ 0  PHQ - 2 Score 0  '2 4 5 1  '$ Altered sleeping '3 3 2 3 1  '$ Tired, decreased energy '3 3 1 1 2  '$ Change in appetite '2 3 1 '$ 0 1  Feeling bad or failure about yourself  0 '3 2 3 '$ 0  Trouble concentrating '3 3 2 3 1  '$ Moving slowly or fidgety/restless '3 3 1 3 '$ 0  Suicidal thoughts 0 0 0 0 0  PHQ-9 Score '14 20 13 18 6  '$ Difficult doing work/chores Somewhat difficult  Somewhat difficult Extremely dIfficult Not difficult at all       02/13/2022    8:36 AM 11/18/2021    4:04 PM 09/26/2021    1:27 PM 06/26/2021   11:23 AM  GAD 7 : Generalized Anxiety Score  Nervous, Anxious, on Edge '2 1 2 3  '$ Control/stop worrying '2 3 2 3  '$ Worry too much - different things '2 3 1 3  '$ Trouble relaxing '1 3 2 1  '$ Restless '1 1 1 2  '$ Easily annoyed or irritable 1 0 1 3  Afraid - awful might happen 0 0 1 0  Total GAD 7 Score '9 11 10 15  '$ Anxiety Difficulty Somewhat difficult Extremely difficult  Extremely difficult    Relevant past medical, surgical, family and social history reviewed and updated as indicated. Interim medical history since our last visit reviewed. Allergies and medications  reviewed and updated.  Review of Systems  Constitutional:  Positive for fatigue. Negative for activity change, appetite change, diaphoresis and fever.  Respiratory:  Negative for cough, chest tightness, shortness of breath and wheezing.   Cardiovascular:  Negative for chest pain, palpitations and leg swelling.  Neurological: Negative.   Psychiatric/Behavioral: Negative.      Per HPI unless specifically indicated above     Objective:    BP 104/69   Pulse 74   Temp 97.6 F (36.4 C) (Oral)   Ht 5' 7.99" (1.727 m)   Wt 260 lb 9.6 oz (118.2 kg)   LMP 02/09/2017 (Within Weeks)   SpO2 97%   BMI 39.63 kg/m   Wt Readings from Last 3 Encounters:  02/13/22 260 lb 9.6 oz (118.2 kg)  01/14/22 262 lb 8 oz (119.1 kg)  12/12/21 260 lb (117.9 kg)    Physical Exam Vitals and nursing note reviewed.  Constitutional:      General: She is awake. She is not in  acute distress.    Appearance: She is well-developed and well-groomed. She is morbidly obese. She is not ill-appearing.  HENT:     Head: Normocephalic.     Right Ear: Hearing normal.     Left Ear: Hearing normal.  Eyes:     General: Lids are normal.        Right eye: No discharge.        Left eye: No discharge.     Conjunctiva/sclera: Conjunctivae normal.     Pupils: Pupils are equal, round, and reactive to light.  Neck:     Thyroid: Thyromegaly present.     Vascular: No carotid bruit or JVD.  Cardiovascular:     Rate and Rhythm: Normal rate and regular rhythm.     Heart sounds: Normal heart sounds. No murmur heard.    No gallop.  Pulmonary:     Effort: Pulmonary effort is normal. No accessory muscle usage or respiratory distress.     Breath sounds: Normal breath sounds.  Abdominal:     General: Bowel sounds are normal.     Palpations: Abdomen is soft.  Musculoskeletal:     Cervical back: Normal range of motion and neck supple.     Right lower leg: No edema.     Left lower leg: No edema.  Lymphadenopathy:     Cervical: No cervical adenopathy.  Skin:    General: Skin is warm and dry.  Neurological:     Mental Status: She is alert and oriented to person, place, and time.     Motor: Tremor present.     Gait: Gait is intact.     Deep Tendon Reflexes:     Reflex Scores:      Brachioradialis reflexes are 1+ on the right side and 1+ on the left side.      Patellar reflexes are 1+ on the right side and 1+ on the left side.    Comments: Bilateral upper extremity tremor L>R -- varies from resting to active tremor.  No rigidity noted or cogwheel.  Normal gait.  No flat affect.  Psychiatric:        Attention and Perception: Attention normal.        Mood and Affect: Mood normal.        Speech: Speech normal.        Behavior: Behavior normal. Behavior is cooperative.        Thought Content: Thought content normal.     Results for orders  placed or performed during the hospital  encounter of 12/12/21  Lipid panel  Result Value Ref Range   Cholesterol 160 0 - 200 mg/dL   Triglycerides 87 <150 mg/dL   HDL 60 >40 mg/dL   Total CHOL/HDL Ratio 2.7 RATIO   VLDL 17 0 - 40 mg/dL   LDL Cholesterol 83 0 - 99 mg/dL      Assessment & Plan:   Problem List Items Addressed This Visit       Endocrine   Thyromegaly    Noted on exam -- check all thyroid labs today and ultrasound ordered to further assess.      Relevant Orders   T4, free   Thyroid peroxidase antibody   TSH   US THYROID     Other   B12 deficiency    Chronic, ongoing.  Continue injections of B12 in office, labs today.      Relevant Orders   CBC with Differential/Platelet   Vitamin B12   Depression - Primary    Chronic, ongoing, currently not followed by psychiatry or therapy.  Denies SI/HI.  Continue current medication regimen and adjust as needed, Celexa + Gabapentin.  Discussed at length with her today.        Fatigue    Ongoing issue -- will check labs today to include CBC, CMP, TSH, B12, and Vit D.      Relevant Orders   T4, free   Thyroid peroxidase antibody   TSH   CBC with Differential/Platelet   Comprehensive metabolic panel   VITAMIN D 25 Hydroxy (Vit-D Deficiency, Fractures)   Vitamin B12   HgB A1c   Obesity    BMI 39.63, taking Wegovy.  Recommended eating smaller high protein, low fat meals more frequently and exercising 30 mins a day 5 times a week with a goal of 10-15lb weight loss in the next 3 months. Patient voiced their understanding and motivation to adhere to these recommendations.       Situational anxiety    Refer to depression plan of care.        Follow up plan: Return for as scheduled January 2nd.

## 2022-02-13 NOTE — Patient Instructions (Signed)
Fatigue If you have fatigue, you feel tired all the time and have a lack of energy or a lack of motivation. Fatigue may make it difficult to start or complete tasks because of exhaustion. Occasional or mild fatigue is often a normal response to activity or life. However, long-term (chronic) or extreme fatigue may be a symptom of a medical condition such as: Depression. Not having enough red blood cells or hemoglobin in the blood (anemia). A problem with a small gland located in the lower front part of the neck (thyroid disorder). Rheumatologic conditions. These are problems related to the body's defense system (immune system). Infections, especially certain viral infections. Fatigue can also lead to negative health outcomes over time. Follow these instructions at home: Medicines Take over-the-counter and prescription medicines only as told by your health care provider. Take a multivitamin if told by your health care provider. Do not use herbal or dietary supplements unless they are approved by your health care provider. Eating and drinking  Avoid heavy meals in the evening. Eat a well-balanced diet, which includes lean proteins, whole grains, plenty of fruits and vegetables, and low-fat dairy products. Avoid eating or drinking too many products with caffeine in them. Avoid alcohol. Drink enough fluid to keep your urine pale yellow. Activity  Exercise regularly, as told by your health care provider. Use or practice techniques to help you relax, such as yoga, tai chi, meditation, or massage therapy. Lifestyle Change situations that cause you stress. Try to keep your work and personal schedules in balance. Do not use recreational or illegal drugs. General instructions Monitor your fatigue for any changes. Go to bed and get up at the same time every day. Avoid fatigue by pacing yourself during the day and getting enough sleep at night. Maintain a healthy weight. Contact a health care  provider if: Your fatigue does not get better. You have a fever. You suddenly lose or gain weight. You have headaches. You have trouble falling asleep or sleeping through the night. You feel angry, guilty, anxious, or sad. You have swelling in your legs or another part of your body. Get help right away if: You feel confused, feel like you might faint, or faint. Your vision is blurry or you have a severe headache. You have severe pain in your abdomen, your back, or the area between your waist and hips (pelvis). You have chest pain, shortness of breath, or an irregular or fast heartbeat. You are unable to urinate, or you urinate less than normal. You have abnormal bleeding from the rectum, nose, lungs, nipples, or, if you are female, the vagina. You vomit blood. You have thoughts about hurting yourself or others. These symptoms may be an emergency. Get help right away. Call 911. Do not wait to see if the symptoms will go away. Do not drive yourself to the hospital. Get help right away if you feel like you may hurt yourself or others, or have thoughts about taking your own life. Go to your nearest emergency room or: Call 911. Call the [facility] at [phone] or 988. This is open 24 hours a day. Text the Crisis Text Line at 979-823-0385. Summary If you have fatigue, you feel tired all the time and have a lack of energy or a lack of motivation. Fatigue may make it difficult to start or complete tasks because of exhaustion. Long-term (chronic) or extreme fatigue may be a symptom of a medical condition. Exercise regularly, as told by your health care provider.  Change situations that cause you stress. Try to keep your work and personal schedules in balance. This information is not intended to replace advice given to you by your health care provider. Make sure you discuss any questions you have with your health care provider. Document Revised: 01/06/2021 Document  Reviewed: 01/06/2021 Elsevier Patient Education  2023 [employer] Inc[employer]  

## 2022-02-13 NOTE — Assessment & Plan Note (Signed)
Refer to depression plan of care. 

## 2022-02-13 NOTE — Assessment & Plan Note (Signed)
BMI 39.63, taking Wegovy.  Recommended eating smaller high protein, low fat meals more frequently and exercising 30 mins a day 5 times a week with a goal of 10-15lb weight loss in the next 3 months. Patient voiced their understanding and motivation to adhere to these recommendations.

## 2022-02-13 NOTE — Assessment & Plan Note (Signed)
Chronic, ongoing, currently not followed by psychiatry or therapy.  Denies SI/HI.  Continue current medication regimen and adjust as needed, Celexa + Gabapentin.  Discussed at length with her today.

## 2022-02-13 NOTE — Assessment & Plan Note (Signed)
Ongoing issue -- will check labs today to include CBC, CMP, TSH, B12, and Vit D.

## 2022-02-13 NOTE — Assessment & Plan Note (Signed)
Noted on exam -- check all thyroid labs today and ultrasound ordered to further assess.

## 2022-02-14 LAB — COMPREHENSIVE METABOLIC PANEL
ALT: 10 IU/L (ref 0–32)
AST: 14 IU/L (ref 0–40)
Albumin/Globulin Ratio: 1.8 (ref 1.2–2.2)
Albumin: 4.8 g/dL (ref 3.8–4.9)
Alkaline Phosphatase: 111 IU/L (ref 44–121)
BUN/Creatinine Ratio: 23 (ref 9–23)
BUN: 16 mg/dL (ref 6–24)
Bilirubin Total: 0.2 mg/dL (ref 0.0–1.2)
CO2: 24 mmol/L (ref 20–29)
Calcium: 9.5 mg/dL (ref 8.7–10.2)
Chloride: 105 mmol/L (ref 96–106)
Creatinine, Ser: 0.69 mg/dL (ref 0.57–1.00)
Globulin, Total: 2.6 g/dL (ref 1.5–4.5)
Glucose: 82 mg/dL (ref 70–99)
Potassium: 4.9 mmol/L (ref 3.5–5.2)
Sodium: 145 mmol/L — ABNORMAL HIGH (ref 134–144)
Total Protein: 7.4 g/dL (ref 6.0–8.5)
eGFR: 103 mL/min/{1.73_m2} (ref >59)

## 2022-02-14 LAB — CBC WITH DIFFERENTIAL/PLATELET
Basophils Absolute: 0.1 10*3/uL (ref 0.0–0.2)
Basos: 2 %
EOS (ABSOLUTE): 0.2 10*3/uL (ref 0.0–0.4)
Eos: 3 %
Hematocrit: 42.6 % (ref 34.0–46.6)
Hemoglobin: 13.9 g/dL (ref 11.1–15.9)
Immature Grans (Abs): 0 10*3/uL (ref 0.0–0.1)
Immature Granulocytes: 0 %
Lymphocytes Absolute: 2 10*3/uL (ref 0.7–3.1)
Lymphs: 28 %
MCH: 28.3 pg (ref 26.6–33.0)
MCHC: 32.6 g/dL (ref 31.5–35.7)
MCV: 87 fL (ref 79–97)
Monocytes Absolute: 0.4 10*3/uL (ref 0.1–0.9)
Monocytes: 6 %
Neutrophils Absolute: 4.3 10*3/uL (ref 1.4–7.0)
Neutrophils: 61 %
Platelets: 389 10*3/uL (ref 150–450)
RBC: 4.92 x10E6/uL (ref 3.77–5.28)
RDW: 13.9 % (ref 11.7–15.4)
WBC: 7 10*3/uL (ref 3.4–10.8)

## 2022-02-14 LAB — VITAMIN B12: Vitamin B-12: 377 pg/mL (ref 232–1245)

## 2022-02-14 LAB — TSH: TSH: 1.14 u[IU]/mL (ref 0.450–4.500)

## 2022-02-14 LAB — THYROID PEROXIDASE ANTIBODY: Thyroperoxidase Ab SerPl-aCnc: <9 IU/mL (ref 0–34)

## 2022-02-14 LAB — T4, FREE: Free T4: 1.02 ng/dL (ref 0.82–1.77)

## 2022-02-14 LAB — HEMOGLOBIN A1C
Est. average glucose Bld gHb Est-mCnc: 103 mg/dL
Hgb A1c MFr Bld: 5.2 % (ref 4.8–5.6)

## 2022-02-14 LAB — VITAMIN D 25 HYDROXY (VIT D DEFICIENCY, FRACTURES): Vit D, 25-Hydroxy: 41.5 ng/mL (ref 30.0–100.0)

## 2022-02-14 NOTE — Progress Notes (Signed)
Contacted via MyChart   Good morning Shawny, your labs have returned: - Thyroid testing shows normal levels and no Hashimoto's, but we will check ultrasound as discussed too to ensure healthy thyroid. - Kidney function, creatinine and eGFR, remains normal, as is liver function, AST and ALT. Sodium, salt, a little elevated (very mild).  Cut back on any salt intake if present and drink a little more water. - Remainder of labs stable, including no prediabetes or diabetes.  B12 remains normal but on lower side normal, continue B12 injections.  Any questions? Keep being amazing!!  Thank you for allowing me to participate in your care.  I appreciate you. Kindest regards, Valetta Mulroy

## 2022-02-16 ENCOUNTER — Ambulatory Visit
Admission: RE | Admit: 2022-02-16 | Discharge: 2022-02-16 | Disposition: A | Discharge status: Home / Self Care | Payer: BC Managed Care – PPO | Source: Ambulatory Visit | Attending: Nurse Practitioner | Admitting: Nurse Practitioner

## 2022-02-16 ENCOUNTER — Encounter: Payer: Self-pay | Admitting: Nurse Practitioner

## 2022-02-16 DIAGNOSIS — E042 Nontoxic multinodular goiter: Secondary | ICD-10-CM | POA: Diagnosis not present

## 2022-02-16 DIAGNOSIS — E010 Iodine-deficiency related diffuse (endemic) goiter: Secondary | ICD-10-CM | POA: Diagnosis not present

## 2022-02-16 DIAGNOSIS — E041 Nontoxic single thyroid nodule: Secondary | ICD-10-CM | POA: Insufficient documentation

## 2022-02-16 NOTE — Progress Notes (Signed)
Contacted via MyChart   Good afternoon Cathy Mitchell, I am glad we performed the ultrasound.  I thought your thyroid felt a little enlarged on exam, labs are normal but ultrasound is showing some nodules that need further assessment.  They are recommending biopsy for nodule both right and left side thyroid + enlarged lymph node area to thyroid.  I will place referral to ENT here locally who does perform these biopsies.  We will go from there based on their findings with this.  Many times these are benign (non cancerous) but it is best to further assess.  Any questions? Keep being amazing!!  Thank you for allowing me to participate in your care.  I appreciate you. Kindest regards, Najah Liverman

## 2022-02-16 NOTE — Addendum Note (Signed)
Addended by: Marnee Guarneri T on: 02/16/2022 05:10 PM   Modules accepted: Orders

## 2022-02-26 DIAGNOSIS — Z5181 Encounter for therapeutic drug level monitoring: Secondary | ICD-10-CM | POA: Diagnosis not present

## 2022-02-26 DIAGNOSIS — F952 Tourette's disorder: Secondary | ICD-10-CM | POA: Diagnosis not present

## 2022-02-26 DIAGNOSIS — F1320 Sedative, hypnotic or anxiolytic dependence, uncomplicated: Secondary | ICD-10-CM | POA: Diagnosis not present

## 2022-02-26 DIAGNOSIS — F32A Depression, unspecified: Secondary | ICD-10-CM | POA: Diagnosis not present

## 2022-02-26 DIAGNOSIS — M797 Fibromyalgia: Secondary | ICD-10-CM | POA: Diagnosis not present

## 2022-02-26 DIAGNOSIS — E041 Nontoxic single thyroid nodule: Secondary | ICD-10-CM | POA: Diagnosis not present

## 2022-02-26 DIAGNOSIS — Z79899 Other long term (current) drug therapy: Secondary | ICD-10-CM | POA: Diagnosis not present

## 2022-02-26 DIAGNOSIS — F411 Generalized anxiety disorder: Secondary | ICD-10-CM | POA: Diagnosis not present

## 2022-03-02 ENCOUNTER — Other Ambulatory Visit: Payer: Self-pay | Admitting: Unknown Physician Specialty

## 2022-03-02 DIAGNOSIS — E041 Nontoxic single thyroid nodule: Secondary | ICD-10-CM

## 2022-03-11 ENCOUNTER — Other Ambulatory Visit: Payer: Self-pay | Admitting: Nurse Practitioner

## 2022-03-11 MED ORDER — ARIPIPRAZOLE 5 MG PO TABS: 5.0000 mg | ORAL_TABLET | Freq: Every day | ORAL | 0 refills | Status: AC

## 2022-03-12 ENCOUNTER — Ambulatory Visit
Admission: RE | Admit: 2022-03-12 | Discharge: 2022-03-12 | Disposition: A | Discharge status: Home / Self Care | Payer: BC Managed Care – PPO | Source: Ambulatory Visit | Attending: Unknown Physician Specialty | Admitting: Unknown Physician Specialty

## 2022-03-12 DIAGNOSIS — R888 Abnormal findings in other body fluids and substances: Secondary | ICD-10-CM | POA: Insufficient documentation

## 2022-03-12 DIAGNOSIS — Z1379 Encounter for other screening for genetic and chromosomal anomalies: Secondary | ICD-10-CM | POA: Insufficient documentation

## 2022-03-12 DIAGNOSIS — E042 Nontoxic multinodular goiter: Secondary | ICD-10-CM | POA: Insufficient documentation

## 2022-03-12 DIAGNOSIS — E041 Nontoxic single thyroid nodule: Secondary | ICD-10-CM

## 2022-03-12 DIAGNOSIS — D440 Neoplasm of uncertain behavior of thyroid gland: Secondary | ICD-10-CM | POA: Diagnosis not present

## 2022-03-12 MED ORDER — LIDOCAINE HCL (PF) 1 % IJ SOLN
15.0000 mL | Freq: Once | INTRAMUSCULAR | Status: AC
  Administered 2022-03-12: 15 mL via INTRADERMAL

## 2022-03-13 ENCOUNTER — Encounter: Payer: Self-pay | Admitting: Nurse Practitioner

## 2022-03-13 ENCOUNTER — Telehealth (INDEPENDENT_AMBULATORY_CARE_PROVIDER_SITE_OTHER): Payer: BC Managed Care – PPO | Admitting: Nurse Practitioner

## 2022-03-13 DIAGNOSIS — E010 Iodine-deficiency related diffuse (endemic) goiter: Secondary | ICD-10-CM

## 2022-03-13 DIAGNOSIS — M1611 Unilateral primary osteoarthritis, right hip: Secondary | ICD-10-CM | POA: Diagnosis not present

## 2022-03-13 DIAGNOSIS — E041 Nontoxic single thyroid nodule: Secondary | ICD-10-CM

## 2022-03-13 DIAGNOSIS — F32A Depression, unspecified: Secondary | ICD-10-CM

## 2022-03-13 DIAGNOSIS — F331 Major depressive disorder, recurrent, moderate: Secondary | ICD-10-CM

## 2022-03-13 LAB — CYTOLOGY - NON PAP

## 2022-03-13 NOTE — Progress Notes (Signed)
LMP 02/09/2017 (Within Weeks)    Subjective:    Patient ID: Cathy Mitchell, female    DOB: 1965-07-09, 56 y.o.   MRN: [id number]  HPI: Cathy Mitchell is a 55 y.o. female  Chief Complaint  Patient presents with   Biopsy results   This visit was completed via video visit through MyChart due to the restrictions of the COVID-19 pandemic. All issues as above were discussed and addressed. Physical exam was done as above through visual confirmation on video through MyChart. If it was felt that the patient should be evaluated in the office, they were directed there. The patient verbally consented to this visit. Location of the patient: home Location of the provider: work Those involved with this call:  Provider: Marnee Guarneri, DNP CMA: Frazier Butt, [place] Desk/Registration: [employer]  Time spent on call:  21 minutes with patient face to face via video conference. More than 50% of this time was spent in counseling and coordination of care. 15 minutes total spent in review of patient's record and preparation of their chart.  I verified patient identity using two factors (patient name and date of birth). Patient consents verbally to being seen via telemedicine visit today.    THYROID NODULES Had thyroid biopsy yesterday -- which she reports took a long time due to calcification.  They advised her to stay in bed today as reported she would have discomfort and SOB.  No results back as of yet on thyroid biopsy.    She was started on Abilify by [employer] which she reports helps her focus and feel better -- but still having issues with both of her knees and hips.  Would like a referral to ortho to work on hip pain and possible injections. Fatigue: yes Cold intolerance: no Heat intolerance: no Weight gain: yes Weight loss: no Constipation: no Diarrhea/loose stools: no Palpitations: no Lower extremity edema: no Anxiety/depressed mood: yes   Relevant past medical, surgical, family  and social history reviewed and updated as indicated. Interim medical history since our last visit reviewed. Allergies and medications reviewed and updated.  Review of Systems  Constitutional:  Negative for activity change, appetite change, diaphoresis, fatigue and fever.  Respiratory:  Negative for cough, chest tightness and shortness of breath.   Cardiovascular:  Negative for chest pain, palpitations and leg swelling.  Gastrointestinal: Negative.   Endocrine: Negative for cold intolerance and heat intolerance.  Neurological: Negative.   Psychiatric/Behavioral: Negative.     Per HPI unless specifically indicated above     Objective:    LMP 02/09/2017 (Within Weeks)   Wt Readings from Last 3 Encounters:  02/13/22 260 lb 9.6 oz (118.2 kg)  01/14/22 262 lb 8 oz (119.1 kg)  12/12/21 260 lb (117.9 kg)    Physical Exam Vitals and nursing note reviewed.  Constitutional:      General: She is awake. She is not in acute distress.    Appearance: She is well-developed. She is not ill-appearing.  HENT:     Head: Normocephalic.     Right Ear: Hearing normal.     Left Ear: Hearing normal.  Eyes:     General: Lids are normal.        Right eye: No discharge.        Left eye: No discharge.     Conjunctiva/sclera: Conjunctivae normal.  Pulmonary:     Effort: Pulmonary effort is normal. No accessory muscle usage or respiratory distress.  Musculoskeletal:  Cervical back: Normal range of motion.  Neurological:     Mental Status: She is alert and oriented to person, place, and time.  Psychiatric:        Attention and Perception: Attention normal.        Mood and Affect: Mood normal.        Behavior: Behavior normal. Behavior is cooperative.        Thought Content: Thought content normal.        Judgment: Judgment normal.    Results for orders placed or performed in visit on 02/13/22  T4, free  Result Value Ref Range   Free T4 1.02 0.82 - 1.77 ng/dL  Thyroid peroxidase antibody   Result Value Ref Range   Thyroperoxidase Ab SerPl-aCnc <9 0 - 34 IU/mL  TSH  Result Value Ref Range   TSH 1.140 0.450 - 4.500 uIU/mL  CBC with Differential/Platelet  Result Value Ref Range   WBC 7.0 3.4 - 10.8 x10E3/uL   RBC 4.92 3.77 - 5.28 x10E6/uL   Hemoglobin 13.9 11.1 - 15.9 g/dL   Hematocrit 42.6 34.0 - 46.6 %   MCV 87 79 - 97 fL   MCH 28.3 26.6 - 33.0 pg   MCHC 32.6 31.5 - 35.7 g/dL   RDW 13.9 11.7 - 15.4 %   Platelets 389 150 - 450 x10E3/uL   Neutrophils 61 Not Estab. %   Lymphs 28 Not Estab. %   Monocytes 6 Not Estab. %   Eos 3 Not Estab. %   Basos 2 Not Estab. %   Neutrophils Absolute 4.3 1.4 - 7.0 x10E3/uL   Lymphocytes Absolute 2.0 0.7 - 3.1 x10E3/uL   Monocytes Absolute 0.4 0.1 - 0.9 x10E3/uL   EOS (ABSOLUTE) 0.2 0.0 - 0.4 x10E3/uL   Basophils Absolute 0.1 0.0 - 0.2 x10E3/uL   Immature Granulocytes 0 Not Estab. %   Immature Grans (Abs) 0.0 0.0 - 0.1 x10E3/uL  Comprehensive metabolic panel  Result Value Ref Range   Glucose 82 70 - 99 mg/dL   BUN 16 6 - 24 mg/dL   Creatinine, Ser 0.69 0.57 - 1.00 mg/dL   eGFR 103 >59 mL/min/1.73   BUN/Creatinine Ratio 23 9 - 23   Sodium 145 (H) 134 - 144 mmol/L   Potassium 4.9 3.5 - 5.2 mmol/L   Chloride 105 96 - 106 mmol/L   CO2 24 20 - 29 mmol/L   Calcium 9.5 8.7 - 10.2 mg/dL   Total Protein 7.4 6.0 - 8.5 g/dL   Albumin 4.8 3.8 - 4.9 g/dL   Globulin, Total 2.6 1.5 - 4.5 g/dL   Albumin/Globulin Ratio 1.8 1.2 - 2.2   Bilirubin Total 0.2 0.0 - 1.2 mg/dL   Alkaline Phosphatase 111 44 - 121 IU/L   AST 14 0 - 40 IU/L   ALT 10 0 - 32 IU/L  VITAMIN D 25 Hydroxy (Vit-D Deficiency, Fractures)  Result Value Ref Range   Vit D, 25-Hydroxy 41.5 30.0 - 100.0 ng/mL  Vitamin B12  Result Value Ref Range   Vitamin B-12 377 232 - 1,245 pg/mL  HgB A1c  Result Value Ref Range   Hgb A1c MFr Bld 5.2 4.8 - 5.6 %   Est. average glucose Bld gHb Est-mCnc 103 mg/dL      Assessment & Plan:   Problem List Items Addressed This Visit        Endocrine   Thyroid nodule    Has thyroid biopsies yesterday, no results as of yet -- awaiting these. She is  concerned for cancer, discuss with her if present some of the treatment options they may discuss.      Thyromegaly    Refer to thyroid nodule plan of care.        Musculoskeletal and Integument   Osteoarthritis of hip (Right) (Chronic)    Chronic, ongoing with current discomfort agitated by recent knee surgeries.  Would like to discuss hip injections with ortho, referral placed.      Relevant Orders   Ambulatory referral to Orthopedics     Other   Depression - Primary    Chronic, ongoing, currently followed by psychiatry -- [facility].  Denies SI/HI.  Continue current medication regimen as ordered by psychiatry and attempt to get notes next visit.  Discussed at length with her today.         I discussed the assessment and treatment plan with the patient. The patient was provided an opportunity to ask questions and all were answered. The patient agreed with the plan and demonstrated an understanding of the instructions.   The patient was advised to call back or seek an in-person evaluation if the symptoms worsen or if the condition fails to improve as anticipated.   I provided 21+ minutes of time during this encounter.    Follow up plan: Return for as schedule January 2nd.

## 2022-03-13 NOTE — Assessment & Plan Note (Signed)
Chronic, ongoing, currently followed by psychiatry -- [facility].  Denies SI/HI.  Continue current medication regimen as ordered by psychiatry and attempt to get notes next visit.  Discussed at length with her today.

## 2022-03-13 NOTE — Assessment & Plan Note (Signed)
Has thyroid biopsies yesterday, no results as of yet -- awaiting these. She is concerned for cancer, discuss with her if present some of the treatment options they may discuss.

## 2022-03-13 NOTE — Assessment & Plan Note (Signed)
Refer to thyroid nodule plan of care.

## 2022-03-13 NOTE — Patient Instructions (Signed)
Thyroid Nodule  A thyroid nodule is an isolated growth of thyroid cells that forms a lump in the thyroid gland. The thyroid gland is a butterfly-shaped gland found in the lower front of the neck. It sends chemical messengers (hormones) through the blood to all parts of the body. These hormones are important in regulating body temperature and helping the body use energy. Thyroid nodules are common. Most are not cancerous (are benign). You may have one nodule or several nodules. There are different types of thyroid nodules. They include nodules that: Grow and fill with fluid (thyroid cysts). Produce too much thyroid hormone (hot nodules or hyperthyroid). Produce no thyroid hormone (cold nodules or hypothyroid). Form from cancer cells (thyroid cancers). What are the causes? In most cases, the cause of thyroid nodules is not known. What increases the risk? The following factors may make you more likely to develop thyroid nodules: Age. Thyroid nodules are more common in people who are older than 45 years. Female gender. A family history that includes: Thyroid nodules. Pheochromocytoma. Thyroid carcinoma. Hyperparathyroidism. Certain thyroid diseases, such as Hashimoto's thyroiditis. Lack of iodine in your diet. A history of head and neck radiation, such as from cancer treatments. Type 2 diabetes. What are the signs or symptoms? In many cases, there are no symptoms. If you have symptoms, they may include: A lump in your lower neck. Feeling pressure, fullness, or a tickle in your throat. Pain in your neck, jaw, or ear. Having trouble swallowing or breathing. Hot nodules may cause: Weight loss. Warm, flushed skin. Feeling hot. Feeling nervous. A rapid or irregular heartbeat. Cold nodules may cause: Weight gain. Dry skin. Hair loss, brittle hair, or both. Feeling cold. Fatigue. Thyroid cancer nodules may cause: Hard nodules that can be felt along the thyroid  gland. Hoarseness. Lumps in the tissue (lymph nodes) near your thyroid gland. How is this diagnosed? A thyroid nodule may be felt by your health care provider during a physical exam. This condition may also be diagnosed based on your symptoms. You may also have tests, including: Blood tests to check how well your thyroid is working. An ultrasound. This may be done to confirm the diagnosis. A biopsy. This involves taking a sample from the nodule and looking at it under a microscope. A thyroid scan. This test creates an image of the thyroid gland using a radioactive tracer. Imaging tests such as an MRI or CT scan. These may be done if: A nodule is large. A nodule is blocking your airway. Cancer is suspected. How is this treated? Treatment depends on the cause and size of your nodule or nodules. If a nodule is benign, treatment may not be necessary. Your health care provider may monitor the nodule to see if it goes away without treatment. If a nodule continues to grow, is cancerous, or does not go away, treatment may be needed. Treatment may include: Having a cystic nodule drained with a needle. Ablation therapy. In this treatment, alcohol is injected into the area of the nodule to destroy the cells. Ablation with heat may also be used. This is called thermal ablation. Radioactive iodine. In this treatment, radioactive iodine is given as a pill or liquid that you drink. This substance causes the thyroid nodule to shrink. Surgery to remove the nodule or nodules. Part or all of your thyroid gland may also need to be removed. Medicines to treat hyperthyroidism. Follow these instructions at home: Pay attention to any changes in your thyroid nodule or nodules. Take over-the-counter  and prescription medicines only as told by your health care provider. Keep all follow-up visits. This is important. Contact a health care provider if: You have trouble sleeping. You have muscle weakness. You have  significant weight loss without changing your eating habits. You feel nervous. You have trouble swallowing. You have increased swelling. You have a rapid or irregular heartbeat. Get help right away if: You have chest pain. You faint or lose consciousness. Your nodule makes it hard for you to breathe. These symptoms may be an emergency. Get help right away. Call 911. Do not wait to see if the symptoms will go away. Do not drive yourself to the hospital. Summary A thyroid nodule is an isolated growth of thyroid cells that forms a lump in your thyroid gland. Thyroid nodules are common. Most are not cancerous. Your health care provider may monitor the nodule to see if it goes away without treatment. If a nodule continues to grow, is cancerous, or does not go away, treatment may be needed. Treatment depends on the cause and size of your nodule or nodules. This information is not intended to replace advice given to you by your health care provider. Make sure you discuss any questions you have with your health care provider. Document Revised: 01/27/2021 Document Reviewed: 01/27/2021 Elsevier Patient Education  [place].

## 2022-03-13 NOTE — Assessment & Plan Note (Signed)
Chronic, ongoing with current discomfort agitated by recent knee surgeries.  Would like to discuss hip injections with ortho, referral placed.

## 2022-03-17 ENCOUNTER — Other Ambulatory Visit: Payer: Self-pay | Admitting: Unknown Physician Specialty

## 2022-03-17 ENCOUNTER — Ambulatory Visit: Payer: BC Managed Care – PPO | Attending: Cardiovascular Disease | Admitting: Cardiovascular Disease

## 2022-03-17 ENCOUNTER — Encounter: Payer: Self-pay | Admitting: Cardiovascular Disease

## 2022-03-17 VITALS — BP 132/82 | HR 79 | Ht 68.0 in | Wt 266.4 lb

## 2022-03-17 DIAGNOSIS — I493 Ventricular premature depolarization: Secondary | ICD-10-CM

## 2022-03-17 DIAGNOSIS — R599 Enlarged lymph nodes, unspecified: Secondary | ICD-10-CM

## 2022-03-17 MED ORDER — DILTIAZEM HCL ER COATED BEADS 120 MG PO CP24: 120.0000 mg | ORAL_CAPSULE | Freq: Three times a day (TID) | ORAL | 1 refills | Status: DC

## 2022-03-17 NOTE — Patient Instructions (Signed)
Medication Instructions:  INCREASE the Diltiazem to 120 mg three times daily  *If you need a refill on your cardiac medications before your next appointment, please call your pharmacy*   Lab Work: None ordered If you have labs (blood work) drawn today and your tests are completely normal, you will receive your results only by: [place] (if you have MyChart) OR A paper copy in the mail If you have any lab test that is abnormal or we need to change your treatment, we will call you to review the results.   Testing/Procedures: None ordered   Follow-Up: At [facility], you and your health needs are our priority.  As part of our continuing mission to provide you with exceptional heart care, we have created designated Provider Care Teams.  These Care Teams include your primary Cardiologist (physician) and Advanced Practice Providers (APPs -  Physician Assistants and Nurse Practitioners) who all work together to provide you with the care you need, when you need it.  We recommend signing up for the patient portal called "MyChart".  Sign up information is provided on this After Visit Summary.  MyChart is used to connect with patients for Virtual Visits (Telemedicine).  Patients are able to view lab/test results, encounter notes, upcoming appointments, etc.  Non-urgent messages can be sent to your provider as well.   To learn more about what you can do with MyChart, go to [email or web address].    Your next appointment:   6 month(s)  The format for your next appointment:   In Person  Provider:   You may see Kathlyn [place], MD or one of the following Advanced Practice Providers on your designated Care Team:   Murray Hodgkins, NP Christell Faith, PA-C Cadence Kathlen Mody, PA-C Gerrie Nordmann, NP    Important Information About Sugar

## 2022-03-17 NOTE — Progress Notes (Signed)
Cardiology Office Note   Date:  03/17/2022   ID:  Cathy Mitchell, DOB Sep 13, 1965, MRN [id number]  PCP:  Venita Lick, NP  Cardiologist:   Kathlyn Sacramento, MD   Chief Complaint  Patient presents with   Follow-up    6 month f/u, no new cardiac concerns        History of Present Illness: Cathy Mitchell is a 56 y.o. female who is here for a follow-up visit regarding frequent symptomatic PVCs. Previous treadmill stress test in 2016 before gastric bypass surgery was normal.   She has no history of diabetes or hyperlipidemia. She does have sleep apnea.  She has no family history of premature coronary artery disease.  She is not a smoker and does not consume excessive amount of caffeine.   Holter monitor in December 2018 showed very frequent PVCs with a total of 24,000 beats in 48 hours representing 12% burden. Echocardiogram showed normal LV systolic function with mild mitral regurgitation.   Her PVCs responded better to diltiazem than metoprolol.  Most recent outpatient monitor showed improvement in PVC burden to 2.9%.  She reports being sensitive to ADHD medications and her anxiety medications.  The dose of diltiazem was changed to twice daily during last visit.  She had significant improvement in symptoms of palpitations but then increase it to 3 times daily with almost complete resolution of symptoms.  She is feeling very well at the present time. Abilify improved her depression symptoms but is making it harder to lose weight.   Past Medical History:  Diagnosis Date   Anxiety    Arthritis    Back pain    Dementia ([place])    Depression    Fibromyalgia    Food allergy    [place]   Hypertension    no longer has hypertension   Joint pain    Kidney problem    Neuromuscular disorder (HCC)    L-sided tremors   Osteoarthritis    Other fatigue    Rheumatoid arthritis ([place])    Sleep apnea    does not use C-PAP    Past Surgical History:  Procedure Laterality Date   CARPAL  TUNNEL RELEASE Bilateral 2007   DILATION AND CURETTAGE OF UTERUS     GANGLION CYST EXCISION Bilateral    GANGLION CYST EXCISION Right 10/08/2015   Procedure: REMOVAL GANGLION OF WRIST;  Surgeon: Hessie Knows, MD;  Location: [facility] ORS;  Service: Orthopedics;  Laterality: Right;   GASTRIC BYPASS     HAND SURGERY Bilateral Resection   HIP SURGERY     HYSTEROSCOPY WITH D & C N/A 05/24/2017   Procedure: DILATATION AND CURETTAGE /HYSTEROSCOPY;  Surgeon: Defrancesco, Alanda Slim, MD;  Location: [facility] OR[facility];  Service: Gynecology;  Laterality: N/A;   INCONTINENCE SURGERY  2009   LEEP N/A 05/24/2017   Procedure: LOOP ELECTROSURGICAL EXCISION PROCEDURE (LEEP);  Surgeon: Brayton Mars, MD;  Location: [facility] OR[facility];  Service: Gynecology;  Laterality: N/A;   TUBAL LIGATION       Current Outpatient Medications  Medication Sig Dispense Refill   acetaminophen (TYLENOL) 500 MG tablet Take 1,500 mg by mouth daily as needed for mild pain.     ARIPiprazole (ABILIFY) 5 MG tablet Take 1 tablet (5 mg total) by mouth daily. 30 tablet 0   busPIRone (BUSPAR) 5 MG tablet Take 1 tablet (5 mg total) by mouth 2 (two) times daily as needed. 180 tablet 4   celecoxib (CELEBREX) 100 MG capsule Take 1  capsule (100 mg total) by mouth daily as needed for moderate pain. 90 capsule 0   citalopram (CELEXA) 40 MG tablet Take 1 tablet (40 mg total) by mouth daily. 90 tablet 4   dicyclomine (BENTYL) 20 MG tablet Take 1 tablet (20 mg total) by mouth 3 (three) times daily. (Patient taking differently: Take 20 mg by mouth daily as needed.) 270 tablet 1   gabapentin (NEURONTIN) 300 MG capsule Take 1 capsule (300 mg total) by mouth 2 (two) times daily. 180 capsule 4   mometasone (ELOCON) 0.1 % lotion 2 (two) times daily.     Semaglutide-Weight Management (WEGOVY) 2.4 MG/0.75ML SOAJ Inject 2.4 mg into the skin once a week. 3 mL 4   Vitamin D, Ergocalciferol, (DRISDOL) 1.25 MG (50000 UNIT) CAPS capsule Take 1 capsule (50,000 Units total) by  mouth once a week. 24 capsule 4   diltiazem (CARDIZEM CD) 120 MG 24 hr capsule Take 1 capsule (120 mg total) by mouth in the morning, at noon, and at bedtime. 270 capsule 1   Current Facility-Administered Medications  Medication Dose Route Frequency Provider Last Rate Last Admin   cyanocobalamin (VITAMIN B12) injection 1,000 mcg  1,000 mcg Intramuscular Q14 Days Cannady, Jolene T, NP   1,000 mcg at 02/13/22 1053    Allergies:   Oxycodone-acetaminophen, Other, and Percocet [oxycodone-acetaminophen]    Social History:  The patient  reports that she quit smoking about 7 years ago. Her smoking use included cigarettes. She has a 14.50 pack-year smoking history. She has never used smokeless tobacco. She reports that she does not drink alcohol and does not use drugs.   Family History:  The patient's family history includes Anxiety disorder in her father and mother; COPD in her father; Depression in her father and mother; Diabetes in her paternal grandfather; Eczema in her son; Heart disease in her maternal grandfather and paternal grandfather; Hypertension in her father and mother; Obesity in her father and mother; Thyroid disease in her daughter.    ROS:  Please see the history of present illness.   Otherwise, review of systems are positive for none.   All other systems are reviewed and negative.    PHYSICAL EXAM: VS:  BP 132/82 (BP Location: Right Arm, Patient Position: Sitting, Cuff Size: Large)   Pulse 79   Ht '5\' 8"'$  (1.727 m)   Wt 266 lb 6.4 oz (120.8 kg)   LMP 02/09/2017 (Within Weeks)   SpO2 95%   BMI 40.51 kg/m  , BMI Body mass index is 40.51 kg/m. GEN: Well nourished, well developed, in no acute distress  HEENT: normal  Neck: no JVD, carotid bruits, or masses Cardiac: RRR with premature beats; no murmurs, rubs, or gallops,no edema  Respiratory:  clear to auscultation bilaterally, normal work of breathing GI: soft, nontender, nondistended, + BS MS: no deformity or atrophy  Skin:  warm and dry, no rash Neuro:  Strength and sensation are intact Psych: euthymic mood, full affect   EKG:  EKG is ordered today. The ekg ordered today demonstrates normal sinus rhythm with no PVCs.  No significant ST or T wave changes.   Recent Labs: 02/13/2022: ALT 10; BUN 16; Creatinine, Ser 0.69; Hemoglobin 13.9; Platelets 389; Potassium 4.9; Sodium 145; TSH 1.140    Lipid Panel    Component Value Date/Time   CHOL 160 12/12/2021 1012   CHOL 145 07/31/2016 1110   TRIG 87 12/12/2021 1012   HDL 60 12/12/2021 1012   HDL 48 07/31/2016 1110   CHOLHDL  2.7 12/12/2021 1012   VLDL 17 12/12/2021 1012   LDLCALC 83 12/12/2021 1012   LDLCALC 73 07/31/2016 1110      Wt Readings from Last 3 Encounters:  03/17/22 266 lb 6.4 oz (120.8 kg)  02/13/22 260 lb 9.6 oz (118.2 kg)  01/14/22 262 lb 8 oz (119.1 kg)          03/02/2017    2:47 PM  PAD Screen  Previous PAD dx? No  Previous surgical procedure? No  Pain with walking? No  Feet/toe relief with dangling? No  Painful, non-healing ulcers? No  Extremities discolored? No      ASSESSMENT AND PLAN:  1. Symptomatic PVCs: She seems to be feeling significantly better on diltiazem extended release 120 mg 3 times daily.  This was refilled today.  She does not have PVCs today on EKG or by physical exam.   2. Sleep apnea: She is now using the CPAP machine which should help with arrhythmia.    Disposition:   FU with me in 6 months  Signed,  Kathlyn Sacramento, MD  03/17/2022 5:18 PM    [facility]

## 2022-03-18 ENCOUNTER — Encounter: Payer: Self-pay | Admitting: Nurse Practitioner

## 2022-03-18 ENCOUNTER — Encounter: Payer: Self-pay | Admitting: Cardiovascular Disease

## 2022-03-18 DIAGNOSIS — E010 Iodine-deficiency related diffuse (endemic) goiter: Secondary | ICD-10-CM

## 2022-03-18 DIAGNOSIS — E041 Nontoxic single thyroid nodule: Secondary | ICD-10-CM

## 2022-03-20 ENCOUNTER — Ambulatory Visit
Admission: RE | Admit: 2022-03-20 | Discharge: 2022-03-20 | Disposition: A | Discharge status: Home / Self Care | Payer: BC Managed Care – PPO | Source: Ambulatory Visit | Attending: Unknown Physician Specialty | Admitting: Unknown Physician Specialty

## 2022-03-20 DIAGNOSIS — R599 Enlarged lymph nodes, unspecified: Secondary | ICD-10-CM

## 2022-03-20 DIAGNOSIS — E079 Disorder of thyroid, unspecified: Secondary | ICD-10-CM | POA: Diagnosis not present

## 2022-03-20 DIAGNOSIS — M25552 Pain in left hip: Secondary | ICD-10-CM | POA: Diagnosis not present

## 2022-03-20 DIAGNOSIS — M545 Low back pain, unspecified: Secondary | ICD-10-CM | POA: Diagnosis not present

## 2022-03-20 DIAGNOSIS — E041 Nontoxic single thyroid nodule: Secondary | ICD-10-CM | POA: Diagnosis not present

## 2022-03-20 DIAGNOSIS — Z96653 Presence of artificial knee joint, bilateral: Secondary | ICD-10-CM | POA: Diagnosis not present

## 2022-03-20 DIAGNOSIS — M25551 Pain in right hip: Secondary | ICD-10-CM | POA: Diagnosis not present

## 2022-03-20 DIAGNOSIS — R590 Localized enlarged lymph nodes: Secondary | ICD-10-CM | POA: Diagnosis not present

## 2022-03-20 DIAGNOSIS — C73 Malignant neoplasm of thyroid gland: Secondary | ICD-10-CM | POA: Diagnosis not present

## 2022-03-20 MED ORDER — LIDOCAINE HCL (PF) 1 % IJ SOLN
10.0000 mL | Freq: Once | INTRAMUSCULAR | Status: AC
  Administered 2022-03-20: 10 mL via SUBCUTANEOUS
  Filled 2022-03-20: qty 10

## 2022-03-20 NOTE — Procedures (Signed)
Vascular and Interventional Radiology Procedure Note  Patient: Cathy Mitchell DOB: 05-11-1965 Medical Record Number: [id number] Note Date/Time: 03/20/22 9:32 AM   Performing Physician: Michaelle Birks, MD Assistant(s): None  Diagnosis: LEFT extra thyroidal mass   Procedure: LEFT EXTRA-THYROIDAL MASS CORE BIOPSY and ASPIRATION  Anesthesia: Local Anesthetic Complications: None Estimated Blood Loss: Minimal Specimens: Sent for Cytology and Pathology  Findings:  Successful Ultrasound-guided biopsy of LEFT extra thyroidal mass . A total of 5 samples were obtained. Hemostasis of the tract was achieved using Manual Pressure.  Plan: Bed rest for 0 hours.  See detailed procedure note with images in PACS. The patient tolerated the procedure well without incident or complication and was returned to Recovery in stable condition.    Michaelle Birks, MD [facility]   Pager. [place]

## 2022-03-24 LAB — CYTOLOGY - NON PAP

## 2022-03-25 ENCOUNTER — Encounter: Payer: Self-pay | Admitting: Unknown Physician Specialty

## 2022-03-25 NOTE — Addendum Note (Signed)
Addended by: Marnee Guarneri T on: 03/25/2022 01:04 PM   Modules accepted: Orders

## 2022-03-26 ENCOUNTER — Encounter: Payer: Self-pay | Admitting: Nurse Practitioner

## 2022-03-26 ENCOUNTER — Other Ambulatory Visit: Payer: Self-pay | Admitting: Nurse Practitioner

## 2022-03-26 DIAGNOSIS — G2581 Restless legs syndrome: Secondary | ICD-10-CM | POA: Diagnosis not present

## 2022-03-26 DIAGNOSIS — F32A Depression, unspecified: Secondary | ICD-10-CM | POA: Diagnosis not present

## 2022-03-26 DIAGNOSIS — C73 Malignant neoplasm of thyroid gland: Secondary | ICD-10-CM | POA: Insufficient documentation

## 2022-03-26 DIAGNOSIS — R41840 Attention and concentration deficit: Secondary | ICD-10-CM | POA: Diagnosis not present

## 2022-03-26 DIAGNOSIS — F952 Tourette's disorder: Secondary | ICD-10-CM | POA: Diagnosis not present

## 2022-03-26 LAB — SURGICAL PATHOLOGY

## 2022-03-26 NOTE — Progress Notes (Signed)
Spoke to [employer] on telephone as she had contacted via MyChart to review her recent biopsy results.  She had seen them on MyChart that lymph node left biopsy noted: - POSITIVE FOR MALIGNANCY.  - METASTATIC PAPILLARY THYROID CARCINOMA  We discussed at length, will place referral to [facility] Endocrine surgery urgent.  Recommend she stop Wegovy. Although appears papillary in nature, would prefer to stop this.

## 2022-03-29 NOTE — Patient Instructions (Signed)
Thyroid Cancer The thyroid is a gland in the front of the neck. It makes hormones that help control many body functions, including how the body uses energy (metabolism). Thyroid cancer is an abnormal growth of cancerous (malignant) cells in the thyroid gland. There are four main types of thyroid cancer: Papillary cancer. This is the least harmful type of thyroid cancer. It typically affects women of childbearing age. Follicular cancer. This type of cancer is the most likely to come back after treatment (recur) and spread to other parts of the body (metastasize). Medullary cancer. This can be passed from parent to child (inherited). A person who inherits the gene for this cancer is at high risk for developing the cancer. Anaplastic cancer. This type may spread quickly to the windpipe (trachea) and cause breathing problems. This type of cancer is most common among people age 55 and older. What are the causes? The exact cause of thyroid cancer is not known. What increases the risk? Risk factors for thyroid cancer include: Exposure to radiation of the head, neck, or chest in the past, especially during infancy or childhood (such as from radiation therapy for cancer). Having a thyroid that is larger than normal (enlarged). This may also be called a thyroid goiter. A family history of thyroid disease. Being female. What are the signs or symptoms? Symptoms of thyroid cancer may include: Enlarged thyroid gland. This may look like a large lump or swelling in the lower, front area of the neck. Hoarseness or a change in how your voice sounds. A cough. Coughing up blood. Difficulty swallowing. Shortness of breath. How is this diagnosed? This condition may be diagnosed based on: Your symptoms and medical history. Imaging tests of the neck area, such as: Ultrasound. CT scan. MRI. PET scan. Blood tests. Removal and testing of a thyroid tissue sample (biopsy). This will help determine the type of  thyroid cancer. Your cancer will be assessed (staged) to determine how severe it is and how much it has spread. How is this treated? Most thyroid cancers are treated with surgery to remove most or all of the thyroid gland (thyroidectomy). In some cases, the lymph nodes in the neck that are close to the thyroid may also be removed during surgery. Lymph nodes are part of the body's disease-fighting (immune) system, and they are usually the first place that cancer spreads to. Treatment may also involve: Radioactive iodine treatment. This is a procedure that involves swallowing a substance (radioactive iodine, or radioiodine) that gets absorbed by the thyroid and destroys cancerous tissue. This may be given to: Destroy remaining tissue that could not be removed surgically. Treat thyroid cancer that has recurred or metastasized. Medicines that kill cancer cells in the body (chemotherapy). High-energy rays that kill cancer cells in the body (radiation therapy). You may have this if your cancer has spread to your bones. A procedure to kill cancer cells in the thyroid by injecting them with alcohol (alcohol ablation). Medicines that help your body's immune system fight the cancer cells (immunotherapy). Thyroid hormone therapy to: Replace the hormone in the body that is normally made by the thyroid. Reduce the activity of (suppress) a hormone that activates the thyroid (thyroid-stimulating hormone, TSH). Follow these instructions at home: Take over-the-counter and prescription medicines only as told by your health care provider. Consider joining a support group for people who have thyroid cancer. Eat a healthy diet that includes fruits and vegetables, whole grains, lean protein, and low-fat or nonfat dairy products. Work with your health  care provider to manage any side effects of treatment. Keep all follow-up visits as told by your health care provider. This is important. Contact a health care provider  if: You have nausea or vomiting. You have diarrhea. You have a rash. You have a fever. You have problems with urinating, such as: A burning sensation while urinating. Needing to urinate more often than usual. Pain or difficulty urinating. Blood in your urine. You develop a new cough. You have symptoms of too much thyroid hormone (hyperthyroidism), such as: Nervousness or anxiety. Weight loss without trying. Sweating. Difficulty sleeping. Hair loss. Heart palpitations. Frequent bowel movements. You have symptoms of too little thyroid hormone (hypothyroidism), such as: Fatigue. Puffiness in your face, hands, or feet. Weight gain without trying. Feeling cold. Constipation. Get help right away if: You have chest pain. You have shortness of breath. You suddenly feel too weak or dizzy to stand or walk. Summary Thyroid cancer is an abnormal growth of cancerous (malignant) cells in the thyroid gland. The exact cause of thyroid cancer is unknown. A number of factors increase the risk, including past exposure to radiation. Diagnosis is based on imaging studies and a biopsy. Most thyroid cancers are treated with surgery to remove most or all of the thyroid gland (thyroidectomy), followed by other treatments such as radiation therapy, hormone therapy, or radioactive iodine treatment. This information is not intended to replace advice given to you by your health care provider. Make sure you discuss any questions you have with your health care provider. Document Revised: 07/17/2021 Document Reviewed: 11/23/2019 Elsevier Patient Education  [place].

## 2022-03-31 ENCOUNTER — Encounter: Payer: Self-pay | Admitting: Nurse Practitioner

## 2022-03-31 ENCOUNTER — Ambulatory Visit: Payer: BC Managed Care – PPO | Admitting: Nurse Practitioner

## 2022-03-31 VITALS — BP 107/71 | HR 78 | Temp 97.7°F | Ht 67.99 in | Wt 270.2 lb

## 2022-03-31 DIAGNOSIS — C73 Malignant neoplasm of thyroid gland: Secondary | ICD-10-CM | POA: Diagnosis not present

## 2022-03-31 DIAGNOSIS — F418 Other specified anxiety disorders: Secondary | ICD-10-CM | POA: Diagnosis not present

## 2022-03-31 DIAGNOSIS — F952 Tourette's disorder: Secondary | ICD-10-CM

## 2022-03-31 DIAGNOSIS — E538 Deficiency of other specified B group vitamins: Secondary | ICD-10-CM | POA: Diagnosis not present

## 2022-03-31 DIAGNOSIS — G629 Polyneuropathy, unspecified: Secondary | ICD-10-CM

## 2022-03-31 DIAGNOSIS — M797 Fibromyalgia: Secondary | ICD-10-CM

## 2022-03-31 DIAGNOSIS — F900 Attention-deficit hyperactivity disorder, predominantly inattentive type: Secondary | ICD-10-CM

## 2022-03-31 DIAGNOSIS — F331 Major depressive disorder, recurrent, moderate: Secondary | ICD-10-CM | POA: Diagnosis not present

## 2022-03-31 DIAGNOSIS — H6502 Acute serous otitis media, left ear: Secondary | ICD-10-CM

## 2022-03-31 MED ORDER — AMOXICILLIN 875 MG PO TABS: 875.0000 mg | ORAL_TABLET | Freq: Two times a day (BID) | ORAL | 0 refills | Status: AC

## 2022-03-31 NOTE — Assessment & Plan Note (Signed)
Chronic, ongoing, currently followed by psychiatry -- [facility].  Denies SI/HI.  Continue current medication regimen as ordered by psychiatry and attempt to get notes next visit.  Discussed at length with her today.

## 2022-03-31 NOTE — Assessment & Plan Note (Signed)
Refer to depression plan of care. 

## 2022-03-31 NOTE — Assessment & Plan Note (Signed)
Acute to left side noted.  Treat with Amoxicillin which has offered benefit in past.

## 2022-03-31 NOTE — Assessment & Plan Note (Signed)
New diagnosis on 03/20/22.  She is scheduled to see [facility] endocrine surgery team on 04/16/22.  Will follow-up with her after this.  Suspect many if her current symptoms related to thyroid issues.

## 2022-03-31 NOTE — Progress Notes (Signed)
BP 107/71   Pulse 78   Temp 97.7 F (36.5 C) (Oral)   Ht 5' 7.99" (1.727 m)   Wt 270 lb 3.2 oz (122.6 kg)   LMP 02/09/2017 (Within Weeks)   SpO2 98%   BMI 41.09 kg/m    Subjective:    Patient ID: Cathy Mitchell, female    DOB: Jun 14, 1965, 57 y.o.   MRN: [id number]  HPI: Cathy Mitchell is a 57 y.o. female  Chief Complaint  Patient presents with   MOOD    New medication is making her gain weight   Weight Check   Ear Pain    Patient states she knows that she has an ear infection, started for a few weeks   Thyroid Problem   THYROID CANCER New diagnosis on 03/20/22, had visit with PCP recently and noted thyromegaly for which imaging was obtained. Pathology noting left lymph node neck positive for malignancy and metastatic papillary thyroid carcinoma (she reviewed results in MyChart and reached out to PCP about these to discuss due to concerns after she read results) -- recent thyroid labs were stable.  She is scheduled to see [facility] endo surgery team on 04/16/22. Followed by ENT, Dr. Tami Ribas, last visit 02/26/22.  She reports not being worried at this time about the cancer, but is worried about her weight gain.  Currently off Wegovy per PCP recommendations due to recent diagnosis. Fatigue: yes Cold intolerance: no Heat intolerance: yes Weight gain: yes Weight loss: no Constipation: yes Diarrhea/loose stools: yes Palpitations: yes Lower extremity edema: no Anxiety/depressed mood: yes   EAR PAIN States she has an ear infection, has had it for 3 weeks.  Left ear causing issues. Duration: weeks Involved ear(s): left Severity:  3/10  Quality:  dull, aching, and throbbing Fever: no Otorrhea: yes Upper respiratory infection symptoms: no Pruritus: no Hearing loss: no Water immersion no Using Q-tips: no Recurrent otitis media:  has frequent ear infections and sees Dr. Tami Ribas Status: fluctuating Treatments attempted: none   DEPRESSION Currently followed by psychiatry --  [place] [facility]).  Currently taking Celexa, Abilify, and Klonopin.  She reports this regimen works, but she is gaining weight which concerns her due to past knee replacement and need to maintain less weight.  Currently lots of stressors with her own and her son's health.  Has ADHD, took multiple medications with psych in past but these made her feel worse. Mood status: stable Satisfied with current treatment?: yes Symptom severity: moderate  Duration of current treatment : chronic Side effects: no Medication compliance: good compliance Psychotherapy/counseling: yes in the past Previous psychiatric medications: multiple medications Depressed mood: yes Anxious mood: yes Anhedonia: yes Significant weight loss or gain: no Insomnia: yes hard to stay asleep Fatigue: yes Feelings of worthlessness or guilt: no Impaired concentration/indecisiveness: yes Suicidal ideations: no Hopelessness: no Crying spells: yes    03/31/2022    8:39 AM 03/13/2022   10:12 AM 02/13/2022    9:42 AM 02/13/2022    8:35 AM 11/18/2021    4:04 PM  Depression screen PHQ 2/9  Decreased Interest 0 0 0 0 1  Down, Depressed, Hopeless 0 1 0 0 1  PHQ - 2 Score 0 1 0 0 2  Altered sleeping '3 3 3 3 3  '$ Tired, decreased energy '3 3 3 3 3  '$ Change in appetite 3 0 '2 2 3  '$ Feeling bad or failure about yourself  0 0 0 0 3  Trouble concentrating '1 3 3 3 '$ 3  Moving slowly or fidgety/restless '1  3 3 3  '$ Suicidal thoughts 0 0 0 0 0  PHQ-9 Score '11 10 14 14 20  '$ Difficult doing work/chores Not difficult at all Somewhat difficult Not difficult at all Somewhat difficult        03/31/2022    8:39 AM 03/13/2022   10:14 AM 02/13/2022    9:42 AM 02/13/2022    8:36 AM  GAD 7 : Generalized Anxiety Score  Nervous, Anxious, on Edge 0 '1 2 2  '$ Control/stop worrying 0 '1 2 2  '$ Worry too much - different things '1 2 2 2  '$ Trouble relaxing '3 1 1 1  '$ Restless '3 3 1 1  '$ Easily annoyed or irritable 0 0 1 1  Afraid - awful might happen 0 0 0 0   Total GAD 7 Score '7 8 9 9  '$ Anxiety Difficulty Not difficult at all Somewhat difficult Not difficult at all Somewhat difficult    Relevant past medical, surgical, family and social history reviewed and updated as indicated. Interim medical history since our last visit reviewed. Allergies and medications reviewed and updated.  Review of Systems  Constitutional:  Positive for fatigue. Negative for activity change, appetite change, diaphoresis and fever.  HENT:  Positive for ear pain. Negative for ear discharge.   Respiratory:  Negative for cough, chest tightness, shortness of breath and wheezing.   Cardiovascular:  Negative for chest pain, palpitations and leg swelling.  Gastrointestinal:  Positive for constipation and diarrhea.  Endocrine: Positive for heat intolerance. Negative for cold intolerance.  Neurological: Negative.   Psychiatric/Behavioral: Negative.      Per HPI unless specifically indicated above     Objective:    BP 107/71   Pulse 78   Temp 97.7 F (36.5 C) (Oral)   Ht 5' 7.99" (1.727 m)   Wt 270 lb 3.2 oz (122.6 kg)   LMP 02/09/2017 (Within Weeks)   SpO2 98%   BMI 41.09 kg/m   Wt Readings from Last 3 Encounters:  03/31/22 270 lb 3.2 oz (122.6 kg)  03/17/22 266 lb 6.4 oz (120.8 kg)  02/13/22 260 lb 9.6 oz (118.2 kg)    Physical Exam Vitals and nursing note reviewed.  Constitutional:      General: She is awake. She is not in acute distress.    Appearance: She is well-developed and well-groomed. She is morbidly obese. She is not ill-appearing.  HENT:     Head: Normocephalic.     Right Ear: Hearing, tympanic membrane, ear canal and external ear normal.     Left Ear: Hearing, ear canal and external ear normal. A middle ear effusion is present. There is no impacted cerumen. Tympanic membrane is injected. Tympanic membrane is not perforated.  Eyes:     General: Lids are normal.        Right eye: No discharge.        Left eye: No discharge.      Conjunctiva/sclera: Conjunctivae normal.     Pupils: Pupils are equal, round, and reactive to light.  Neck:     Thyroid: Thyromegaly present.     Vascular: No carotid bruit or JVD.  Cardiovascular:     Rate and Rhythm: Normal rate and regular rhythm.     Heart sounds: Normal heart sounds. No murmur heard.    No gallop.  Pulmonary:     Effort: Pulmonary effort is normal. No accessory muscle usage or respiratory distress.     Breath sounds: Normal breath sounds.  Abdominal:     General: Bowel sounds are normal.     Palpations: Abdomen is soft.  Musculoskeletal:     Cervical back: Normal range of motion and neck supple.     Right lower leg: No edema.     Left lower leg: No edema.  Lymphadenopathy:     Cervical: No cervical adenopathy.  Skin:    General: Skin is warm and dry.  Neurological:     Mental Status: She is alert and oriented to person, place, and time.     Motor: Tremor present.     Gait: Gait is intact.     Deep Tendon Reflexes:     Reflex Scores:      Brachioradialis reflexes are 1+ on the right side and 1+ on the left side.      Patellar reflexes are 1+ on the right side and 1+ on the left side.    Comments: Bilateral upper extremity tremor L>R -- varies from resting to active tremor.  No rigidity noted or cogwheel.  Normal gait.  No flat affect.  Psychiatric:        Attention and Perception: Attention normal.        Mood and Affect: Mood normal.        Speech: Speech normal.        Behavior: Behavior normal. Behavior is cooperative.        Thought Content: Thought content normal.    Results for orders placed or performed during the hospital encounter of 03/20/22  Surgical pathology  Result Value Ref Range   SURGICAL PATHOLOGY      SURGICAL PATHOLOGY CASE: [phone] [phone] PATIENT: Sally-Anne Clinger Surgical Pathology Report     Specimen Submitted: A. Lymph node, left neck  Clinical History: None provided    DIAGNOSIS: A. LYMPH NODE, LEFT NECK;  ULTRASOUND-GUIDED BIOPSY: - POSITIVE FOR MALIGNANCY. - METASTATIC PAPILLARY THYROID CARCINOMA.  Comment: This case is reviewed together with the patient's concurrent extra-thyroidal FNA ([id number]), as well as previous bilateral thyroid FNAs ([id number] and [id number]).  Concurrent flow cytometric studies demonstrate no immunophenotypic evidence of a monoclonal B-cell or aberrant T-cell population, in a limited evaluation.  For further details, see scanned report in [facility].  GROSS DESCRIPTION: A. Labeled: Left neck lymph node Received: 1 container is received fresh and 1 container is received in formalin Collection time: 9:15 AM on 03/20/2022 Placed into formalin time: 9:15 AM on 03/20/2022 and 10:03 AM on 03/20/2022 Number of needle core biopsy(s): 3 cor es are received fresh and 1 core plus additional fragments are received in formalin Length: Ranges from 0.6 to 1.0 cm Diameter: 0.1 cm Description: Received are tan cores and of soft tissue.  A touch prep is performed on the fresh cores, and 2 cores are sent for flow cytometry Ink: Green Entirely submitted in 2 cassettes with fresh cores in A1 and cores in formalin in A2  CM 03/20/2022  Final Diagnosis performed by Allena Napoleon, MD.   Electronically signed 03/26/2022 9:07:51AM The electronic signature indicates that the named Attending Pathologist has evaluated the specimen Technical component performed at [facility], [address] [place], [place] [address] Lab: [phone] Dir: Rush Farmer, MD, MMM  Professional component performed at [facility], [facility], [place], [place], [place] [address] Lab: [phone] Dir: Kathi Simpers, MD   Cytology - Non PAP; LEFT extra thyroidal mass, aspiration of fluid component  Result Value Ref Range   CYTOLOGY - NON GYN      CYTOLOGY -  NON PAP CASE: [id number] PATIENT: Ashritha Sanfilippo Non-Gynecological Cytology Report     Specimen Submitted: A. Extra-thyroidal  mass, left neck  Clinical History: Left extra thyoidal mass; aspiration of fluid component    DIAGNOSIS: A. SOFT TISSUE, LEFT NECK; ULTRASOUND-GUIDED FINE-NEEDLE ASPIRATION: - NOT DIAGNOSTIC OF MALIGNANCY. - PAUCICELLULAR SPECIMEN, COMPRISED OF BLOOD, MACROPHAGES AND MIXED INFLAMMATORY CELLS, AND RARE EPITHELIAL CELL CLUSTERS WITH MILD ATYPIA.  Comment: Although mild atypia is identified within the relatively sparse epithelial cell clusters, its significance is unclear.  Recommend correlation with results of concurrent biopsy ([id number]).  Slides reviewed: 1 ThinPrep, 1 cell block  GROSS DESCRIPTION: A. Labeled: Left neck fluid Received: Fresh Collection time: 9:20 AM on 03/20/2022 Placed into formalin time: Not applicable Volume: Approximately 0.6 mL Description of fluid and container in which it is received: Rece ived in a clear plastic syringe with a white screw cap is dark red, opaque fluid Cytospin slide(s) received: No  Specimen material submitted for: Cell block and ThinPrep  The cell block material is fixed in formalin for 6 hours prior to processing.  CM 03/20/2022  Final Diagnosis performed by Allena Napoleon, MD.   Electronically signed 03/24/2022 4:10:04PM The electronic signature indicates that the named Attending Pathologist has evaluated the specimen Technical component performed at [facility], [address] [place], [place] [address] Lab: [phone] Dir: Rush Farmer, MD, MMM  Professional component performed at [facility], [facility], [place], [place], [place] [address] Lab: [phone] Dir: Kathi Simpers, MD       Assessment & Plan:   Problem List Items Addressed This Visit       Endocrine   Thyroid cancer [facility])    New diagnosis on 03/20/22.  She is scheduled to see [facility] endocrine surgery team on 04/16/22.  Will follow-up with her after this.  Suspect many if her current symptoms related to thyroid issues.       Relevant Medications   amoxicillin (AMOXIL) 875 MG tablet     Nervous and Auditory   Otitis media    Acute to left side noted.  Treat with Amoxicillin which has offered benefit in past.      Relevant Medications   amoxicillin (AMOXIL) 875 MG tablet     Other   Attention deficit hyperactivity disorder (ADHD), predominantly inattentive type    Previously followed by [facility] with psychiatry -- at this time continue current medication regimen as prescribed by them.        Depression - Primary    Chronic, ongoing, currently followed by psychiatry -- [facility].  Denies SI/HI.  Continue current medication regimen as ordered by psychiatry and attempt to get notes next visit.  Discussed at length with her today.        Situational anxiety    Refer to depression plan of care.        Follow up plan: Return in about 4 weeks (around 04/28/2022) for THYROID CANCER.

## 2022-03-31 NOTE — Assessment & Plan Note (Signed)
Previously followed by [facility] with psychiatry -- at this time continue current medication regimen as prescribed by them.

## 2022-04-01 DIAGNOSIS — M546 Pain in thoracic spine: Secondary | ICD-10-CM | POA: Diagnosis not present

## 2022-04-01 DIAGNOSIS — M9902 Segmental and somatic dysfunction of thoracic region: Secondary | ICD-10-CM | POA: Diagnosis not present

## 2022-04-01 DIAGNOSIS — M531 Cervicobrachial syndrome: Secondary | ICD-10-CM | POA: Diagnosis not present

## 2022-04-01 DIAGNOSIS — M9901 Segmental and somatic dysfunction of cervical region: Secondary | ICD-10-CM | POA: Diagnosis not present

## 2022-04-01 LAB — CYTOLOGY - NON PAP

## 2022-04-02 ENCOUNTER — Encounter: Payer: Self-pay | Admitting: Unknown Physician Specialty

## 2022-04-11 ENCOUNTER — Encounter: Payer: Self-pay | Admitting: Nurse Practitioner

## 2022-04-14 DIAGNOSIS — C73 Malignant neoplasm of thyroid gland: Secondary | ICD-10-CM | POA: Diagnosis not present

## 2022-04-14 DIAGNOSIS — Z85850 Personal history of malignant neoplasm of thyroid: Secondary | ICD-10-CM | POA: Insufficient documentation

## 2022-04-14 DIAGNOSIS — C799 Secondary malignant neoplasm of unspecified site: Secondary | ICD-10-CM | POA: Diagnosis not present

## 2022-04-15 DIAGNOSIS — Z96652 Presence of left artificial knee joint: Secondary | ICD-10-CM | POA: Diagnosis not present

## 2022-04-16 ENCOUNTER — Encounter: Payer: Self-pay | Admitting: Nurse Practitioner

## 2022-04-21 DIAGNOSIS — G894 Chronic pain syndrome: Secondary | ICD-10-CM | POA: Diagnosis not present

## 2022-04-21 DIAGNOSIS — I10 Essential (primary) hypertension: Secondary | ICD-10-CM | POA: Diagnosis not present

## 2022-04-21 DIAGNOSIS — G4733 Obstructive sleep apnea (adult) (pediatric): Secondary | ICD-10-CM | POA: Diagnosis not present

## 2022-04-21 DIAGNOSIS — E669 Obesity, unspecified: Secondary | ICD-10-CM | POA: Diagnosis not present

## 2022-04-22 DIAGNOSIS — M546 Pain in thoracic spine: Secondary | ICD-10-CM | POA: Diagnosis not present

## 2022-04-22 DIAGNOSIS — M9901 Segmental and somatic dysfunction of cervical region: Secondary | ICD-10-CM | POA: Diagnosis not present

## 2022-04-22 DIAGNOSIS — M9902 Segmental and somatic dysfunction of thoracic region: Secondary | ICD-10-CM | POA: Diagnosis not present

## 2022-04-22 DIAGNOSIS — M531 Cervicobrachial syndrome: Secondary | ICD-10-CM | POA: Diagnosis not present

## 2022-04-23 DIAGNOSIS — R41840 Attention and concentration deficit: Secondary | ICD-10-CM | POA: Diagnosis not present

## 2022-04-23 DIAGNOSIS — F32A Depression, unspecified: Secondary | ICD-10-CM | POA: Diagnosis not present

## 2022-04-23 DIAGNOSIS — G2581 Restless legs syndrome: Secondary | ICD-10-CM | POA: Diagnosis not present

## 2022-04-23 DIAGNOSIS — M797 Fibromyalgia: Secondary | ICD-10-CM | POA: Diagnosis not present

## 2022-04-24 DIAGNOSIS — R918 Other nonspecific abnormal finding of lung field: Secondary | ICD-10-CM | POA: Diagnosis not present

## 2022-04-24 DIAGNOSIS — C73 Malignant neoplasm of thyroid gland: Secondary | ICD-10-CM | POA: Diagnosis not present

## 2022-04-26 ENCOUNTER — Other Ambulatory Visit: Payer: Self-pay | Admitting: Nurse Practitioner

## 2022-04-27 DIAGNOSIS — G4733 Obstructive sleep apnea (adult) (pediatric): Secondary | ICD-10-CM | POA: Diagnosis not present

## 2022-04-27 DIAGNOSIS — F32A Depression, unspecified: Secondary | ICD-10-CM | POA: Diagnosis not present

## 2022-04-27 DIAGNOSIS — F952 Tourette's disorder: Secondary | ICD-10-CM | POA: Diagnosis not present

## 2022-04-27 DIAGNOSIS — C799 Secondary malignant neoplasm of unspecified site: Secondary | ICD-10-CM | POA: Diagnosis not present

## 2022-04-27 DIAGNOSIS — C770 Secondary and unspecified malignant neoplasm of lymph nodes of head, face and neck: Secondary | ICD-10-CM | POA: Diagnosis not present

## 2022-04-27 DIAGNOSIS — M797 Fibromyalgia: Secondary | ICD-10-CM | POA: Diagnosis not present

## 2022-04-27 DIAGNOSIS — Z6841 Body Mass Index (BMI) 40.0 and over, adult: Secondary | ICD-10-CM | POA: Diagnosis not present

## 2022-04-27 DIAGNOSIS — I10 Essential (primary) hypertension: Secondary | ICD-10-CM | POA: Diagnosis not present

## 2022-04-27 DIAGNOSIS — Z87891 Personal history of nicotine dependence: Secondary | ICD-10-CM | POA: Diagnosis not present

## 2022-04-27 DIAGNOSIS — R590 Localized enlarged lymph nodes: Secondary | ICD-10-CM | POA: Diagnosis not present

## 2022-04-27 DIAGNOSIS — F419 Anxiety disorder, unspecified: Secondary | ICD-10-CM | POA: Diagnosis not present

## 2022-04-27 DIAGNOSIS — C73 Malignant neoplasm of thyroid gland: Secondary | ICD-10-CM | POA: Diagnosis not present

## 2022-04-27 DIAGNOSIS — G8918 Other acute postprocedural pain: Secondary | ICD-10-CM | POA: Diagnosis not present

## 2022-04-27 NOTE — Telephone Encounter (Signed)
Requested Prescriptions  Pending Prescriptions Disp Refills   celecoxib (CELEBREX) 100 MG capsule [Pharmacy Med Name: CELECOXIB 100 MG CAPSULE] 90 capsule 2    Sig: TAKE 1 CAPSULE (100 MG TOTAL) BY MOUTH DAILY AS NEEDED FOR MODERATE PAIN.     Analgesics:  COX2 Inhibitors Failed - 04/26/2022 12:38 PM      Failed - Manual Review: Labs are only required if the patient has taken medication for more than 8 weeks.      Passed - HGB in normal range and within 360 days    Hemoglobin  Date Value Ref Range Status  02/13/2022 13.9 11.1 - 15.9 g/dL Final         Passed - Cr in normal range and within 360 days    Creatinine  Date Value Ref Range Status  06/19/2012 0.77 0.60 - 1.30 mg/dL Final   Creatinine, Ser  Date Value Ref Range Status  02/13/2022 0.69 0.57 - 1.00 mg/dL Final         Passed - HCT in normal range and within 360 days    Hematocrit  Date Value Ref Range Status  02/13/2022 42.6 34.0 - 46.6 % Final         Passed - AST in normal range and within 360 days    AST  Date Value Ref Range Status  02/13/2022 14 0 - 40 IU/L Final   SGOT(AST)  Date Value Ref Range Status  06/19/2012 27 15 - 37 Unit/L Final         Passed - ALT in normal range and within 360 days    ALT  Date Value Ref Range Status  02/13/2022 10 0 - 32 IU/L Final   SGPT (ALT)  Date Value Ref Range Status  06/19/2012 42 12 - 78 U/L Final         Passed - eGFR is 30 or above and within 360 days    EGFR (African American)  Date Value Ref Range Status  06/19/2012 >60  Final   GFR calc Af Amer  Date Value Ref Range Status  03/05/2020 119  Final   EGFR (Non-African Amer.)  Date Value Ref Range Status  06/19/2012 >60  Final    Comment:    eGFR values <64m/min/1.73 m2 may be an indication of chronic kidney disease (CKD). Calculated eGFR is useful in patients with stable renal function. The eGFR calculation will not be reliable in acutely ill patients when serum creatinine is changing rapidly. It  is not useful in  patients on dialysis. The eGFR calculation may not be applicable to patients at the low and high extremes of body sizes, pregnant women, and vegetarians.    GFR calc non Af Amer  Date Value Ref Range Status  03/05/2020 103  Final   eGFR  Date Value Ref Range Status  02/13/2022 103 >59 mL/min/1.73 Final         Passed - Patient is not pregnant      Passed - Valid encounter within last 12 months    Recent Outpatient Visits           3 weeks ago Moderate episode of recurrent major depressive disorder ([facility]   [place] [facility], [place]   1 month ago Moderate episode of recurrent major depressive disorder ([facility]   [place] T, NP   2 months ago Moderate episode of recurrent major depressive disorder ([facility]   [facility]  Venita Lick, NP   4 months ago PVC's (premature ventricular contractions)   [place], [place] T, NP   5 months ago Non-recurrent acute serous otitis media of both [place], Barbaraann Faster, NP

## 2022-04-28 ENCOUNTER — Encounter: Payer: Self-pay | Admitting: Nurse Practitioner

## 2022-04-28 ENCOUNTER — Ambulatory Visit: Payer: BC Managed Care – PPO | Admitting: Nurse Practitioner

## 2022-04-28 ENCOUNTER — Telehealth: Payer: Self-pay | Admitting: Cardiovascular Disease

## 2022-04-28 NOTE — Telephone Encounter (Signed)
Pt c/o medication issue:  1. Name of Medication: diltiazem (CARDIZEM CD) 120 MG 24 hr capsule   2. How are you currently taking this medication (dosage and times per day)?   3. Are you having a reaction (difficulty breathing--STAT)?   4. What is your medication issue? Courtney from [place] called to confirm the currently dosing for this medication for this patient.  Patient is currently admitted into the hospital.  Loma Sousa can be reached at [phone]

## 2022-04-28 NOTE — Telephone Encounter (Signed)
Spoke with Loma Sousa from [place]. She stated she is calling to seek clarification on pt diltiazem dose as pt is currently admitted. She report prescription is wrote for 120 mg TID.  Nurse made pharmacy aware of the following notation from 12/19 visit. However, she reports medication has been filled with 24 hour ER dose and wanted to make MD aware medication also comes in 12 hour extended release. She states they are unable to refill pt's diltiazem 24 hr ER for 120 mg TID as it's a once a day medication.    Symptomatic PVCs: She seems to be feeling significantly better on diltiazem extended release 120 mg 3 times daily.

## 2022-04-29 ENCOUNTER — Encounter: Payer: Self-pay | Admitting: Cardiovascular Disease

## 2022-04-29 NOTE — Telephone Encounter (Signed)
Cathy Mitchell and informed her of MD's recommendations. She stated pt was recently discharged and advised to contact pt for change. Nurse attempted to contact pt. Left message to call back.

## 2022-04-29 NOTE — Telephone Encounter (Signed)
I am ok if the prescription is changed to diltiazem short-acting 120 mg 3 times daily.

## 2022-04-30 ENCOUNTER — Encounter: Payer: Self-pay | Admitting: Nurse Practitioner

## 2022-04-30 ENCOUNTER — Telehealth: Payer: Self-pay

## 2022-04-30 MED ORDER — DILTIAZEM HCL 120 MG PO TABS: 120.0000 mg | ORAL_TABLET | Freq: Three times a day (TID) | ORAL | 3 refills | Status: DC

## 2022-04-30 NOTE — Patient Outreach (Signed)
  Care Coordination TOC Note Transition Care Management Unsuccessful Follow-up Telephone Call  Date of discharge and from where:  [facility] 04/29/22  Attempts:  1st Attempt  Reason for unsuccessful TCM follow-up call:  Left voice message- Left message on spouses identified voicemail.  Johnney Killian, RN, BSN, CCM Care Management Coordinator [place] Phone: [phone]: [phone]

## 2022-04-30 NOTE — Telephone Encounter (Signed)
See MyChart message-patient prefers to be contacted by MyChart and not phone.

## 2022-05-01 ENCOUNTER — Telehealth: Payer: Self-pay

## 2022-05-01 NOTE — Patient Outreach (Signed)
  Care Coordination [facility] Note Transition Care Management Follow-up Telephone Call Date of discharge and from where: [facility] 04/29/22 How have you been since you were released from the hospital? "I have numbness in my jaw" Any questions or concerns? Yes-Patient c/o being lightheaded and dizzy since she stated her Levothyroxine '112mg'$ . Advised patient to call her surgeon and let him know of the issues she is having.  Items Reviewed: Did the pt receive and understand the discharge instructions provided? Yes  Medications obtained and verified? Yes  Other? No  Any new allergies since your discharge? No  Dietary orders reviewed? Yes Do you have support at home? Yes   Home Care and Equipment/Supplies: Were home health services ordered? no If so, what is the name of the agency? N/a  Has the agency set up a time to come to the patient's home? no Were any new equipment or medical supplies ordered?  No What is the name of the medical supply agency? N/a Were you able to get the supplies/equipment? no Do you have any questions related to the use of the equipment or supplies? No  Functional Questionnaire: (I = Independent and D = Dependent) ADLs: I  Bathing/Dressing- needs assistance  Meal Prep- I  Eating- I  Maintaining continence- I  Transferring/Ambulation- I  Managing Meds- I  Follow up appointments reviewed:  PCP Hospital f/u appt confirmed? No   Specialist Hospital f/u appt confirmed? Yes  Scheduled to see Dr. Alroy [facility] surgeon) on 05/06/22. Are transportation arrangements needed? No  If their condition worsens, is the pt aware to call PCP or go to the Emergency Dept.? Yes Was the patient provided with contact information for the PCP's office or ED? Yes Was to pt encouraged to call back with questions or concerns? Yes  SDOH assessments and interventions completed:   Yes SDOH Interventions Today    Flowsheet Row Most Recent Value  SDOH Interventions   Food Insecurity  Interventions Intervention Not Indicated  Housing Interventions Intervention Not Indicated  Transportation Interventions Intervention Not Indicated       Care Coordination Interventions:  No Care Coordination interventions needed at this time.   Encounter Outcome:  Pt. Visit Completed

## 2022-05-04 ENCOUNTER — Encounter: Payer: Self-pay | Admitting: Nurse Practitioner

## 2022-05-04 ENCOUNTER — Telehealth: Payer: BC Managed Care – PPO | Admitting: Nurse Practitioner

## 2022-05-04 DIAGNOSIS — H6693 Otitis media, unspecified, bilateral: Secondary | ICD-10-CM

## 2022-05-04 DIAGNOSIS — C799 Secondary malignant neoplasm of unspecified site: Secondary | ICD-10-CM

## 2022-05-04 DIAGNOSIS — C73 Malignant neoplasm of thyroid gland: Secondary | ICD-10-CM

## 2022-05-04 MED ORDER — AMOXICILLIN 400 MG/5ML PO SUSR: 800.0000 mg | Freq: Two times a day (BID) | ORAL | 0 refills | Status: AC

## 2022-05-04 NOTE — Patient Instructions (Signed)

## 2022-05-04 NOTE — Assessment & Plan Note (Signed)
Acute and ongoing for 5 days with no improvement.  Will send in Amoxicillin which has offered her benefit prior infections, send in liquid form due to recent surgery.  Monitor closely and recommend if ongoing symptoms notify provider or [facility] ENT team.

## 2022-05-04 NOTE — Assessment & Plan Note (Signed)
Thyroidectomy performed on 04/27/22, still recovering from this.  Continue to collaborate with [facility] team.

## 2022-05-04 NOTE — Assessment & Plan Note (Signed)
Performed on 04/27/22, still recovering from this.  Continue to collaborate with [facility] team.

## 2022-05-04 NOTE — Progress Notes (Signed)
LMP 02/09/2017 (Within Weeks)    Subjective:    Patient ID: Cathy Mitchell Guardian, female    DOB: July 03, 1965, 57 y.o.   MRN: [id number]  HPI: Cathy Mitchell is a 57 y.o. female  Chief Complaint  Patient presents with   Sore Throat    Pt states she started having a sore throat and ear drainage for the last 4 days. States she has taken some leftover Amoxicillin that she had and is able to swallow now. States she could not do this without it hurting very bad for the last few days.    This visit was completed via telephone due to the restrictions of the COVID-19 pandemic. All issues as above were discussed and addressed but no physical exam was performed. If it was felt that the patient should be evaluated in the office, they were directed there. The patient verbally consented to this visit. Patient was unable to complete an audio/visual visit due to Technical difficulties", "Lack of internet. Due to the catastrophic nature of the COVID-19 pandemic, this visit was done through audio contact only. Location of the patient: home Location of the provider: work Those involved with this call:  Provider: Marnee Guarneri, DNP CMA: Frazier Butt, CMA Front Desk/Registration: [employer]  Time spent on call:  21 minutes on the phone discussing health concerns. 15 minutes total spent in review of patient's record and preparation of their chart.  I verified patient identity using two factors (patient name and date of birth). Patient consents verbally to being seen via telemedicine visit today.    UPPER RESPIRATORY TRACT INFECTION Reports all the nurses that were caring for her in hospital recently all had sore throats and were not well, so when she came home 5 days she started with symptoms.  Recent thyroidectomy.  Had some Amoxicillin leftover at home, hurt to swallow pill but knew she had to take it.  Took 3 days of it. Fever: no Cough: yes Shortness of breath: no Wheezing: no Chest pain: no Chest  tightness: yes Chest congestion: no Nasal congestion: yes Runny nose: no Post nasal drip: yes Sneezing: no Sore throat: yes Swollen glands: no Sinus pressure: yes Headache: no Face pain: no Toothache: no Ear pain: yes bilateral Ear pressure: yes bilateral Eyes red/itching:no Eye drainage/crusting: no  Vomiting: no Rash: no Fatigue: yes Sick contacts: yes Strep contacts: no  Context: stable Recurrent sinusitis: no Relief with OTC cold/cough medications: yes  Treatments attempted: anti-histamine and antibiotics    Relevant past medical, surgical, family and social history reviewed and updated as indicated. Interim medical history since our last visit reviewed. Allergies and medications reviewed and updated.  Review of Systems  Constitutional:  Positive for fatigue. Negative for activity change, appetite change, chills, diaphoresis and fever.  HENT:  Positive for congestion, ear discharge, ear pain, postnasal drip and sore throat. Negative for rhinorrhea, sinus pressure, sinus pain, sneezing and voice change.   Respiratory:  Positive for cough. Negative for chest tightness, shortness of breath and wheezing.   Cardiovascular: Negative.   Gastrointestinal: Negative.   Neurological: Negative.   Psychiatric/Behavioral: Negative.      Per HPI unless specifically indicated above     Objective:    LMP 02/09/2017 (Within Weeks)   Wt Readings from Last 3 Encounters:  03/31/22 270 lb 3.2 oz (122.6 kg)  03/17/22 266 lb 6.4 oz (120.8 kg)  02/13/22 260 lb 9.6 oz (118.2 kg)    Physical Exam  Unable to perform due to patient  having technical issues with video and sound.  Telephone only visit.  Results for orders placed or performed during the hospital encounter of 03/20/22  Surgical pathology  Result Value Ref Range   SURGICAL PATHOLOGY      SURGICAL PATHOLOGY CASE: [id number] PATIENT: Cathy Mitchell Surgical Pathology Report     Specimen Submitted: A. Lymph node,  left neck  Clinical History: None provided    DIAGNOSIS: A. LYMPH NODE, LEFT NECK; ULTRASOUND-GUIDED BIOPSY: - POSITIVE FOR MALIGNANCY. - METASTATIC PAPILLARY THYROID CARCINOMA.  Comment: This case is reviewed together with the patient's concurrent extra-thyroidal FNA ([id number]), as well as previous bilateral thyroid FNAs (ARC-23-975 and 976).  Concurrent flow cytometric studies demonstrate no immunophenotypic evidence of a monoclonal B-cell or aberrant T-cell population, in a limited evaluation.  For further details, see scanned report in [facility].  GROSS DESCRIPTION: A. Labeled: Left neck lymph node Received: 1 container is received fresh and 1 container is received in formalin Collection time: 9:15 AM on 03/20/2022 Placed into formalin time: 9:15 AM on 03/20/2022 and 10:03 AM on 03/20/2022 Number of needle core biopsy(s): 3 cor es are received fresh and 1 core plus additional fragments are received in formalin Length: Ranges from 0.6 to 1.0 cm Diameter: 0.1 cm Description: Received are tan cores and of soft tissue.  A touch prep is performed on the fresh cores, and 2 cores are sent for flow cytometry Ink: Green Entirely submitted in 2 cassettes with fresh cores in A1 and cores in formalin in A2  CM 03/20/2022  Final Diagnosis performed by Allena Napoleon, MD.   Electronically signed 03/26/2022 9:07:51AM The electronic signature indicates that the named Attending Pathologist has evaluated the specimen Technical component performed at [facility], [address] [place], [place] [address] Lab: [phone] Dir: Rush Farmer, MD, MMM  Professional component performed at [facility], [facility], [place], [place], [place] [address] Lab: [phone] Dir: Kathi Simpers, MD   Cytology - Non PAP; LEFT extra thyroidal mass, aspiration of fluid component  Result Value Ref Range   CYTOLOGY - NON GYN      CYTOLOGY - NON PAP CASE: [id number] PATIENT: Cathy  Mitchell Non-Gynecological Cytology Report     Specimen Submitted: A. Extra-thyroidal mass, left neck  Clinical History: Left extra thyoidal mass; aspiration of fluid component    DIAGNOSIS: A. SOFT TISSUE, LEFT NECK; ULTRASOUND-GUIDED FINE-NEEDLE ASPIRATION: - NOT DIAGNOSTIC OF MALIGNANCY. - PAUCICELLULAR SPECIMEN, COMPRISED OF BLOOD, MACROPHAGES AND MIXED INFLAMMATORY CELLS, AND RARE EPITHELIAL CELL CLUSTERS WITH MILD ATYPIA.  Comment: Although mild atypia is identified within the relatively sparse epithelial cell clusters, its significance is unclear.  Recommend correlation with results of concurrent biopsy ([id number]).  Slides reviewed: 1 ThinPrep, 1 cell block  GROSS DESCRIPTION: A. Labeled: Left neck fluid Received: Fresh Collection time: 9:20 AM on 03/20/2022 Placed into formalin time: Not applicable Volume: Approximately 0.6 mL Description of fluid and container in which it is received: Rece ived in a clear plastic syringe with a white screw cap is dark red, opaque fluid Cytospin slide(s) received: No  Specimen material submitted for: Cell block and ThinPrep  The cell block material is fixed in formalin for 6 hours prior to processing.  CM 03/20/2022  Final Diagnosis performed by Allena Napoleon, MD.   Electronically signed 03/24/2022 4:10:04PM The electronic signature indicates that the named Attending Pathologist has evaluated the specimen Technical component performed at [facility], [address], [place], [place] [address] Lab: [phone] Dir: Rush Farmer, MD, MMM  Professional component performed  at [facility], [facility], [place], [place], [place] [address] Lab: [phone] Dir: Kathi Simpers, MD       Assessment & Plan:   Problem List Items Addressed This Visit       Endocrine   Thyroid cancer [facility])    Performed on 04/27/22, still recovering from this.  Continue to collaborate with [facility] team.      Relevant  Medications   amoxicillin (AMOXIL) 400 MG/5ML suspension     Nervous and Auditory   Acute otitis media, bilateral    Acute and ongoing for 5 days with no improvement.  Will send in Amoxicillin which has offered her benefit prior infections, send in liquid form due to recent surgery.  Monitor closely and recommend if ongoing symptoms notify provider or [facility] ENT team.      Relevant Medications   amoxicillin (AMOXIL) 400 MG/5ML suspension     Other   Metastatic papillary carcinoma ([place]) - Primary    Thyroidectomy performed on 04/27/22, still recovering from this.  Continue to collaborate with [facility] team.      Relevant Medications   amoxicillin (AMOXIL) 400 MG/5ML suspension    I discussed the assessment and treatment plan with the patient. The patient was provided an opportunity to ask questions and all were answered. The patient agreed with the plan and demonstrated an understanding of the instructions.   The patient was advised to call back or seek an in-person evaluation if the symptoms worsen or if the condition fails to improve as anticipated.   I provided 21+ minutes of time during this encounter.    Follow up plan: Return if symptoms worsen or fail to improve.

## 2022-05-05 DIAGNOSIS — C799 Secondary malignant neoplasm of unspecified site: Secondary | ICD-10-CM | POA: Diagnosis not present

## 2022-05-05 DIAGNOSIS — Z09 Encounter for follow-up examination after completed treatment for conditions other than malignant neoplasm: Secondary | ICD-10-CM | POA: Diagnosis not present

## 2022-05-13 DIAGNOSIS — M9901 Segmental and somatic dysfunction of cervical region: Secondary | ICD-10-CM | POA: Diagnosis not present

## 2022-05-13 DIAGNOSIS — M546 Pain in thoracic spine: Secondary | ICD-10-CM | POA: Diagnosis not present

## 2022-05-13 DIAGNOSIS — M531 Cervicobrachial syndrome: Secondary | ICD-10-CM | POA: Diagnosis not present

## 2022-05-13 DIAGNOSIS — M9902 Segmental and somatic dysfunction of thoracic region: Secondary | ICD-10-CM | POA: Diagnosis not present

## 2022-05-15 DIAGNOSIS — M797 Fibromyalgia: Secondary | ICD-10-CM | POA: Diagnosis not present

## 2022-05-15 DIAGNOSIS — R4189 Other symptoms and signs involving cognitive functions and awareness: Secondary | ICD-10-CM | POA: Diagnosis not present

## 2022-05-15 DIAGNOSIS — G629 Polyneuropathy, unspecified: Secondary | ICD-10-CM | POA: Diagnosis not present

## 2022-05-21 DIAGNOSIS — F32A Depression, unspecified: Secondary | ICD-10-CM | POA: Diagnosis not present

## 2022-05-21 DIAGNOSIS — R41840 Attention and concentration deficit: Secondary | ICD-10-CM | POA: Diagnosis not present

## 2022-05-21 DIAGNOSIS — M797 Fibromyalgia: Secondary | ICD-10-CM | POA: Diagnosis not present

## 2022-05-21 DIAGNOSIS — G2581 Restless legs syndrome: Secondary | ICD-10-CM | POA: Diagnosis not present

## 2022-05-22 ENCOUNTER — Encounter: Payer: Self-pay | Admitting: Nurse Practitioner

## 2022-05-25 DIAGNOSIS — M792 Neuralgia and neuritis, unspecified: Secondary | ICD-10-CM | POA: Diagnosis not present

## 2022-05-25 DIAGNOSIS — G259 Extrapyramidal and movement disorder, unspecified: Secondary | ICD-10-CM | POA: Diagnosis not present

## 2022-05-26 ENCOUNTER — Encounter: Payer: Self-pay | Admitting: Nurse Practitioner

## 2022-05-26 DIAGNOSIS — G518 Other disorders of facial nerve: Secondary | ICD-10-CM | POA: Diagnosis not present

## 2022-05-26 DIAGNOSIS — Z96653 Presence of artificial knee joint, bilateral: Secondary | ICD-10-CM | POA: Diagnosis not present

## 2022-05-27 MED ORDER — GABAPENTIN 300 MG PO CAPS: 300.0000 mg | ORAL_CAPSULE | Freq: Three times a day (TID) | ORAL | 4 refills | Status: DC

## 2022-06-09 DIAGNOSIS — M9902 Segmental and somatic dysfunction of thoracic region: Secondary | ICD-10-CM | POA: Diagnosis not present

## 2022-06-09 DIAGNOSIS — M531 Cervicobrachial syndrome: Secondary | ICD-10-CM | POA: Diagnosis not present

## 2022-06-09 DIAGNOSIS — M9901 Segmental and somatic dysfunction of cervical region: Secondary | ICD-10-CM | POA: Diagnosis not present

## 2022-06-09 DIAGNOSIS — M546 Pain in thoracic spine: Secondary | ICD-10-CM | POA: Diagnosis not present

## 2022-06-13 ENCOUNTER — Encounter (INDEPENDENT_AMBULATORY_CARE_PROVIDER_SITE_OTHER): Payer: Self-pay

## 2022-06-15 DIAGNOSIS — C73 Malignant neoplasm of thyroid gland: Secondary | ICD-10-CM | POA: Diagnosis not present

## 2022-06-16 DIAGNOSIS — C73 Malignant neoplasm of thyroid gland: Secondary | ICD-10-CM | POA: Diagnosis not present

## 2022-06-18 DIAGNOSIS — C73 Malignant neoplasm of thyroid gland: Secondary | ICD-10-CM | POA: Diagnosis not present

## 2022-06-25 DIAGNOSIS — C73 Malignant neoplasm of thyroid gland: Secondary | ICD-10-CM | POA: Diagnosis not present

## 2022-06-30 ENCOUNTER — Encounter: Payer: Self-pay | Admitting: Nurse Practitioner

## 2022-07-01 ENCOUNTER — Encounter: Payer: Self-pay | Admitting: Nurse Practitioner

## 2022-07-03 ENCOUNTER — Encounter: Payer: Self-pay | Admitting: Nurse Practitioner

## 2022-07-03 DIAGNOSIS — M170 Bilateral primary osteoarthritis of knee: Secondary | ICD-10-CM | POA: Diagnosis not present

## 2022-07-03 DIAGNOSIS — Z96653 Presence of artificial knee joint, bilateral: Secondary | ICD-10-CM | POA: Diagnosis not present

## 2022-07-08 DIAGNOSIS — M9902 Segmental and somatic dysfunction of thoracic region: Secondary | ICD-10-CM | POA: Diagnosis not present

## 2022-07-08 DIAGNOSIS — M9901 Segmental and somatic dysfunction of cervical region: Secondary | ICD-10-CM | POA: Diagnosis not present

## 2022-07-08 DIAGNOSIS — M546 Pain in thoracic spine: Secondary | ICD-10-CM | POA: Diagnosis not present

## 2022-07-08 DIAGNOSIS — M531 Cervicobrachial syndrome: Secondary | ICD-10-CM | POA: Diagnosis not present

## 2022-07-13 MED ORDER — LEVOTHYROXINE SODIUM 112 MCG PO TABS: 112.0000 ug | ORAL_TABLET | Freq: Every day | ORAL | 2 refills | Status: DC

## 2022-07-16 NOTE — Telephone Encounter (Signed)
Called and scheduled patient on overbook with provider on 07/17/2022 @ 10 am via MyChart video visit.

## 2022-07-17 ENCOUNTER — Ambulatory Visit: Payer: BC Managed Care – PPO | Admitting: Nurse Practitioner

## 2022-07-17 ENCOUNTER — Encounter: Payer: Self-pay | Admitting: Nurse Practitioner

## 2022-07-17 VITALS — BP 120/80 | HR 67 | Temp 97.6°F | Ht 67.99 in | Wt 293.9 lb

## 2022-07-17 DIAGNOSIS — H6693 Otitis media, unspecified, bilateral: Secondary | ICD-10-CM

## 2022-07-17 MED ORDER — AMOXICILLIN 875 MG PO TABS: 875.0000 mg | ORAL_TABLET | Freq: Two times a day (BID) | ORAL | 0 refills | Status: AC

## 2022-07-17 MED ORDER — MECLIZINE HCL 12.5 MG PO TABS: 12.5000 mg | ORAL_TABLET | Freq: Three times a day (TID) | ORAL | 0 refills | Status: AC | PRN

## 2022-07-17 NOTE — Assessment & Plan Note (Signed)
Acute and ongoing for > 5 days with no improvement.  Will send in Amoxicillin which has offered her benefit prior infections.  Monitor closely and recommend if ongoing symptoms notify provider or [facility] ENT team.

## 2022-07-17 NOTE — Progress Notes (Signed)
BP 120/80   Pulse 67   Temp 97.6 F (36.4 C) (Oral)   Ht 5' 7.99" (1.727 m)   Wt 293 lb 14.4 oz (133.3 kg)   LMP 02/09/2017 (Within Weeks)   SpO2 96%   BMI 44.70 kg/m    Subjective:    Patient ID: Cathy Mitchell, female    DOB: 1966/03/14, 57 y.o.   MRN: [id number]  HPI: Cathy Mitchell is a 57 y.o. female  Chief Complaint  Patient presents with   Ear Pain    Started about a week ago, R>L   EAR PAIN Started with pain last week to both ears, R>L. Duration: weeks Involved ear(s): bilateral Severity:  5/10  Quality:  dull, aching, pressure-like, and throbbing Fever: no Otorrhea: yes Upper respiratory infection symptoms:  runny nose Pruritus: yes Hearing loss: no Water immersion no Using Q-tips: yes Recurrent otitis media: no -- none recent, but does get multiple Status: fluctuating Treatments attempted: Tylenol and two days worth of Amoxicillin she had leftover.  Relevant past medical, surgical, family and social history reviewed and updated as indicated. Interim medical history since our last visit reviewed. Allergies and medications reviewed and updated.  Review of Systems  Constitutional:  Negative for activity change, appetite change, chills, fatigue and fever.  HENT:  Positive for ear pain. Negative for congestion, ear discharge, facial swelling, postnasal drip, rhinorrhea, sinus pressure, sinus pain, sneezing, sore throat and voice change.   Eyes:  Negative for pain.  Respiratory:  Negative for cough, chest tightness, shortness of breath and wheezing.   Cardiovascular:  Negative for chest pain, palpitations and leg swelling.  Endocrine: Negative.   Neurological: Negative.   Psychiatric/Behavioral: Negative.      Per HPI unless specifically indicated above     Objective:    BP 120/80   Pulse 67   Temp 97.6 F (36.4 C) (Oral)   Ht 5' 7.99" (1.727 m)   Wt 293 lb 14.4 oz (133.3 kg)   LMP 02/09/2017 (Within Weeks)   SpO2 96%   BMI 44.70 kg/m   Wt  Readings from Last 3 Encounters:  07/17/22 293 lb 14.4 oz (133.3 kg)  03/31/22 270 lb 3.2 oz (122.6 kg)  03/17/22 266 lb 6.4 oz (120.8 kg)    Physical Exam Vitals and nursing note reviewed.  Constitutional:      General: She is awake. She is not in acute distress.    Appearance: She is well-developed and well-groomed. She is obese. She is not ill-appearing or toxic-appearing.  HENT:     Head: Normocephalic.     Right Ear: Hearing, ear canal and external ear normal. No tenderness. A middle ear effusion is present. Tympanic membrane is injected. Tympanic membrane is not perforated.     Left Ear: Hearing, ear canal and external ear normal. No tenderness. A middle ear effusion is present. Tympanic membrane is injected. Tympanic membrane is not perforated.     Nose: No rhinorrhea.     Right Sinus: No maxillary sinus tenderness or frontal sinus tenderness.     Left Sinus: No maxillary sinus tenderness or frontal sinus tenderness.     Mouth/Throat:     Mouth: Mucous membranes are moist.     Pharynx: Posterior oropharyngeal erythema (mild with cobblestone appearance) present. No pharyngeal swelling or oropharyngeal exudate.  Eyes:     General: Lids are normal.        Right eye: No discharge.        Left eye: No  discharge.     Conjunctiva/sclera: Conjunctivae normal.     Pupils: Pupils are equal, round, and reactive to light.  Neck:     Thyroid: No thyromegaly.     Vascular: No carotid bruit or JVD.  Cardiovascular:     Rate and Rhythm: Normal rate and regular rhythm.     Heart sounds: Normal heart sounds. No murmur heard.    No gallop.  Pulmonary:     Effort: Pulmonary effort is normal. No accessory muscle usage or respiratory distress.     Breath sounds: Normal breath sounds.  Abdominal:     General: Bowel sounds are normal.     Palpations: Abdomen is soft.  Musculoskeletal:     Cervical back: Normal range of motion and neck supple.     Right lower leg: No edema.     Left lower  leg: No edema.  Lymphadenopathy:     Cervical: No cervical adenopathy.  Skin:    General: Skin is warm and dry.  Neurological:     Mental Status: She is alert and oriented to person, place, and time.     Gait: Gait is intact.  Psychiatric:        Attention and Perception: Attention normal.        Mood and Affect: Mood normal.        Speech: Speech normal.        Behavior: Behavior normal. Behavior is cooperative.        Thought Content: Thought content normal.     Results for orders placed or performed during the hospital encounter of 03/20/22  Surgical pathology  Result Value Ref Range   SURGICAL PATHOLOGY      SURGICAL PATHOLOGY CASE: [id number] PATIENT: Cathy Mitchell Surgical Pathology Report     Specimen Submitted: A. Lymph node, left neck  Clinical History: None provided    DIAGNOSIS: A. LYMPH NODE, LEFT NECK; ULTRASOUND-GUIDED BIOPSY: - POSITIVE FOR MALIGNANCY. - METASTATIC PAPILLARY THYROID CARCINOMA.  Comment: This case is reviewed together with the patient's concurrent extra-thyroidal FNA ([id number]), as well as previous bilateral thyroid FNAs ([id number] and 976).  Concurrent flow cytometric studies demonstrate no immunophenotypic evidence of a monoclonal B-cell or aberrant T-cell population, in a limited evaluation.  For further details, see scanned report in [facility].  GROSS DESCRIPTION: A. Labeled: Left neck lymph node Received: 1 container is received fresh and 1 container is received in formalin Collection time: 9:15 AM on 03/20/2022 Placed into formalin time: 9:15 AM on 03/20/2022 and 10:03 AM on 03/20/2022 Number of needle core biopsy(s): 3 cor es are received fresh and 1 core plus additional fragments are received in formalin Length: Ranges from 0.6 to 1.0 cm Diameter: 0.1 cm Description: Received are tan cores and of soft tissue.  A touch prep is performed on the fresh cores, and 2 cores are sent for flow cytometry Ink: Green Entirely  submitted in 2 cassettes with fresh cores in A1 and cores in formalin in A2  CM 03/20/2022  Final Diagnosis performed by Katherine Mantle, MD.   Electronically signed 03/26/2022 9:07:51AM The electronic signature indicates that the named Attending Pathologist has evaluated the specimen Technical component performed at [facility], [address] [place], [place] [address] Lab: [phone] Dir:  Schimke, MD, MMM  Professional component performed at [facility], [facility], [address] [place], [place], [place] [address] Lab: [phone] Dir: Beryle Quant, MD   Cytology - Non PAP; LEFT extra thyroidal mass, aspiration of fluid component  Result Value Ref  Range   CYTOLOGY - NON GYN      CYTOLOGY - NON PAP CASE: [id number] PATIENT: Digna Puopolo Non-Gynecological Cytology Report     Specimen Submitted: A. Extra-thyroidal mass, left neck  Clinical History: Left extra thyoidal mass; aspiration of fluid component    DIAGNOSIS: A. SOFT TISSUE, LEFT NECK; ULTRASOUND-GUIDED FINE-NEEDLE ASPIRATION: - NOT DIAGNOSTIC OF MALIGNANCY. - PAUCICELLULAR SPECIMEN, COMPRISED OF BLOOD, MACROPHAGES AND MIXED INFLAMMATORY CELLS, AND RARE EPITHELIAL CELL CLUSTERS WITH MILD ATYPIA.  Comment: Although mild atypia is identified within the relatively sparse epithelial cell clusters, its significance is unclear.  Recommend correlation with results of concurrent biopsy ([id number]).  Slides reviewed: 1 ThinPrep, 1 cell block  GROSS DESCRIPTION: A. Labeled: Left neck fluid Received: Fresh Collection time: 9:20 AM on 03/20/2022 Placed into formalin time: Not applicable Volume: Approximately 0.6 mL Description of fluid and container in which it is received: Rece ived in a clear plastic syringe with a white screw cap is dark red, opaque fluid Cytospin slide(s) received: No  Specimen material submitted for: Cell block and ThinPrep  The cell block material is fixed in formalin for  6 hours prior to processing.  CM 03/20/2022  Final Diagnosis performed by Katherine Mantle, MD.   Electronically signed 03/24/2022 4:10:04PM The electronic signature indicates that the named Attending Pathologist has evaluated the specimen Technical component performed at [facility], [address] [place], [place] [address] Lab: [phone] Dir:  Schimke, MD, MMM  Professional component performed at [facility], [facility], [address] [place], [place], [place] [address] Lab: [phone] Dir: Beryle Quant, MD       Assessment & Plan:   Problem List Items Addressed This Visit       Nervous and Auditory   Acute otitis media, bilateral - Primary    Acute and ongoing for > 5 days with no improvement.  Will send in Amoxicillin which has offered her benefit prior infections.  Monitor closely and recommend if ongoing symptoms notify provider or [facility] ENT team.      Relevant Medications   amoxicillin (AMOXIL) 875 MG tablet     Follow up plan: Return for as scheduled April 23rd.

## 2022-07-17 NOTE — Patient Instructions (Signed)

## 2022-07-19 NOTE — Patient Instructions (Signed)

## 2022-07-21 ENCOUNTER — Encounter: Payer: Self-pay | Admitting: Nurse Practitioner

## 2022-07-21 ENCOUNTER — Telehealth (INDEPENDENT_AMBULATORY_CARE_PROVIDER_SITE_OTHER): Payer: BC Managed Care – PPO | Admitting: Nurse Practitioner

## 2022-07-21 DIAGNOSIS — E039 Hypothyroidism, unspecified: Secondary | ICD-10-CM | POA: Insufficient documentation

## 2022-07-21 DIAGNOSIS — E890 Postprocedural hypothyroidism: Secondary | ICD-10-CM | POA: Diagnosis not present

## 2022-07-21 DIAGNOSIS — E6609 Other obesity due to excess calories: Secondary | ICD-10-CM | POA: Diagnosis not present

## 2022-07-21 DIAGNOSIS — C799 Secondary malignant neoplasm of unspecified site: Secondary | ICD-10-CM

## 2022-07-21 DIAGNOSIS — Z6839 Body mass index (BMI) 39.0-39.9, adult: Secondary | ICD-10-CM

## 2022-07-21 NOTE — Assessment & Plan Note (Addendum)
Thyroidectomy performed on 04/27/22, currently on Levothyroxine.  Continue to collaborate with [facility] team as needed, she wishes PCP to takeover Levothyroxine.

## 2022-07-21 NOTE — Assessment & Plan Note (Signed)
BMI 39.63, no longer on Wegovy due to thyroid cancer.  Placed referral to weight management in February and will check on this.  Recommended eating smaller high protein, low fat meals more frequently and exercising 30 mins a day 5 times a week with a goal of 10-15lb weight loss in the next 3 months. Patient voiced their understanding and motivation to adhere to these recommendations.

## 2022-07-21 NOTE — Progress Notes (Signed)
LMP 02/09/2017 (Within Weeks)    Subjective:    Patient ID: Cathy Mitchell, female    DOB: 25-Jan-1966, 57 y.o.   MRN: [id number]  HPI: Cathy Mitchell is a 57 y.o. female  Chief Complaint  Patient presents with   Hypothyroidism   Obesity   This visit was completed via video visit through MyChart due to the restrictions of the COVID-19 pandemic. All issues as above were discussed and addressed. Physical exam was done as above through visual confirmation on video through MyChart. If it was felt that the patient should be evaluated in the office, they were directed there. The patient verbally consented to this visit. Location of the patient: home Location of the provider: work Those involved with this call:  Provider: Aura Dials, DNP CMA: Tristan Schroeder, CMA Front Desk/Registration: Ozella Almond  Time spent on call:  21 minutes with patient face to face via video conference. More than 50% of this time was spent in counseling and coordination of care. 15 minutes total spent in review of patient's record and preparation of their chart.  I verified patient identity using two factors (patient name and date of birth). Patient consents verbally to being seen via telemedicine visit today.    HYPOTHYROIDISM Diagnosed with thyroid cancer in December 2023.  Thyroid removed on 04/27/22 by Dr. Azucena Fallen at [facility].  She is taking Levothyroxine 112 MCG daily.  Thyroid labs 06/30/22.  She goes to [facility], [facility] for free labs.  She is concerned about weight gain, can not be above 255 lbs due to her knee replacement.  Was on Wegovy in past, but with thyroid cancer we stopped this.  She did lose to less then 255 lbs in past.  She is trying to find dietician to help with her weight loss + thyroid and sodium levels.  Reports her insurance gave her a list of people to call for dietician, but they can not find anyone who will take her on due to complexity.  A weight management referral was placed 05/22/22, but  she has not heard from anyone.   Thyroid control status:stable Satisfied with current treatment? yes Medication side effects: no Medication compliance: good compliance Etiology of hypothyroidism: thyroid removal Recent dose adjustment:no Fatigue: yes Cold intolerance: no Heat intolerance: no Weight gain: yes Weight loss: no Constipation: yes Diarrhea/loose stools: yes Palpitations: no Lower extremity edema: no Anxiety/depressed mood: yes   Relevant past medical, surgical, family and social history reviewed and updated as indicated. Interim medical history since our last visit reviewed. Allergies and medications reviewed and updated.  Review of Systems  Constitutional:  Negative for activity change, appetite change, diaphoresis, fatigue and fever.  Respiratory:  Negative for cough, chest tightness and shortness of breath.   Cardiovascular:  Negative for chest pain, palpitations and leg swelling.  Gastrointestinal:  Positive for constipation and diarrhea.  Endocrine: Negative for cold intolerance and heat intolerance.  Neurological: Negative.   Psychiatric/Behavioral: Negative.     Per HPI unless specifically indicated above     Objective:    LMP 02/09/2017 (Within Weeks)   Wt Readings from Last 3 Encounters:  07/17/22 293 lb 14.4 oz (133.3 kg)  03/31/22 270 lb 3.2 oz (122.6 kg)  03/17/22 266 lb 6.4 oz (120.8 kg)    Physical Exam Vitals and nursing note reviewed.  Constitutional:      General: She is awake. She is not in acute distress.    Appearance: She is well-developed and well-groomed. She is obese.  She is not ill-appearing or toxic-appearing.  HENT:     Head: Normocephalic.     Right Ear: Hearing normal.     Left Ear: Hearing normal.  Eyes:     General: Lids are normal.        Right eye: No discharge.        Left eye: No discharge.     Conjunctiva/sclera: Conjunctivae normal.  Pulmonary:     Effort: Pulmonary effort is normal. No accessory muscle usage or  respiratory distress.  Musculoskeletal:     Cervical back: Normal range of motion.  Neurological:     Mental Status: She is alert and oriented to person, place, and time.  Psychiatric:        Attention and Perception: Attention normal.        Mood and Affect: Mood normal.        Behavior: Behavior normal. Behavior is cooperative.        Thought Content: Thought content normal.        Judgment: Judgment normal.     Results for orders placed or performed during the hospital encounter of 03/20/22  Surgical pathology  Result Value Ref Range   SURGICAL PATHOLOGY      SURGICAL PATHOLOGY CASE: [id number] PATIENT: Cathy Mitchell Surgical Pathology Report     Specimen Submitted: A. Lymph node, left neck  Clinical History: None provided    DIAGNOSIS: A. LYMPH NODE, LEFT NECK; ULTRASOUND-GUIDED BIOPSY: - POSITIVE FOR MALIGNANCY. - METASTATIC PAPILLARY THYROID CARCINOMA.  Comment: This case is reviewed together with the patient's concurrent extra-thyroidal FNA ([id number]), as well as previous bilateral thyroid FNAs ([id number] and [id number]).  Concurrent flow cytometric studies demonstrate no immunophenotypic evidence of a monoclonal B-cell or aberrant T-cell population, in a limited evaluation.  For further details, see scanned report in [facility].  GROSS DESCRIPTION: A. Labeled: Left neck lymph node Received: 1 container is received fresh and 1 container is received in formalin Collection time: 9:15 AM on 03/20/2022 Placed into formalin time: 9:15 AM on 03/20/2022 and 10:03 AM on 03/20/2022 Number of needle core biopsy(s): 3 cor es are received fresh and 1 core plus additional fragments are received in formalin Length: Ranges from 0.6 to 1.0 cm Diameter: 0.1 cm Description: Received are tan cores and of soft tissue.  A touch prep is performed on the fresh cores, and 2 cores are sent for flow cytometry Ink: Green Entirely submitted in 2 cassettes with fresh cores in A1 and  cores in formalin in A2  CM 03/20/2022  Final Diagnosis performed by Katherine Mantle, MD.   Electronically signed 03/26/2022 9:07:51AM The electronic signature indicates that the named Attending Pathologist has evaluated the specimen Technical component performed at [place], [address] [place], [place] [address] Lab: [phone] Dir:  Schimke, MD, MMM  Professional component performed at [facility], [facility], [address] [place], [place], [place] [address] Lab: [phone] Dir: Beryle Quant, MD   Cytology - Non PAP; LEFT extra thyroidal mass, aspiration of fluid component  Result Value Ref Range   CYTOLOGY - NON GYN      CYTOLOGY - NON PAP CASE: [id number] PATIENT: Cathy Mitchell Non-Gynecological Cytology Report     Specimen Submitted: A. Extra-thyroidal mass, left neck  Clinical History: Left extra thyoidal mass; aspiration of fluid component    DIAGNOSIS: A. SOFT TISSUE, LEFT NECK; ULTRASOUND-GUIDED FINE-NEEDLE ASPIRATION: - NOT DIAGNOSTIC OF MALIGNANCY. - PAUCICELLULAR SPECIMEN, COMPRISED OF BLOOD, MACROPHAGES AND MIXED INFLAMMATORY CELLS, AND RARE EPITHELIAL CELL CLUSTERS WITH  MILD ATYPIA.  Comment: Although mild atypia is identified within the relatively sparse epithelial cell clusters, its significance is unclear.  Recommend correlation with results of concurrent biopsy ([id number]).  Slides reviewed: 1 ThinPrep, 1 cell block  GROSS DESCRIPTION: A. Labeled: Left neck fluid Received: Fresh Collection time: 9:20 AM on 03/20/2022 Placed into formalin time: Not applicable Volume: Approximately 0.6 mL Description of fluid and container in which it is received: Rece ived in a clear plastic syringe with a white screw cap is dark red, opaque fluid Cytospin slide(s) received: No  Specimen material submitted for: Cell block and ThinPrep  The cell block material is fixed in formalin for 6 hours prior to processing.  CM  03/20/2022  Final Diagnosis performed by Katherine Mantle, MD.   Electronically signed 03/24/2022 4:10:04PM The electronic signature indicates that the named Attending Pathologist has evaluated the specimen Technical component performed at [facility], [address] [place], [place] [address] Lab: [phone] Dir:  Schimke, MD, MMM  Professional component performed at [facility], [facility], [address] [place], [place], [place] [address] Lab: [phone] Dir: Beryle Quant, MD       Assessment & Plan:   Problem List Items Addressed This Visit       Endocrine   Hypothyroid - Primary    Ongoing, she will scan and send recent labs to PCP via MyChart -- will continue current Levothyroxine dosing and adjust as needed.  Recommend she obtain TSH and Free T4.        Other   Metastatic papillary carcinoma    Thyroidectomy performed on 04/27/22, currently on Levothyroxine.  Continue to collaborate with [facility] team as needed, she wishes PCP to takeover Levothyroxine.      Obesity    BMI 39.63, no longer on Wegovy due to thyroid cancer.  Placed referral to weight management in February and will check on this.  Recommended eating smaller high protein, low fat meals more frequently and exercising 30 mins a day 5 times a week with a goal of 10-15lb weight loss in the next 3 months. Patient voiced their understanding and motivation to adhere to these recommendations.        I discussed the assessment and treatment plan with the patient. The patient was provided an opportunity to ask questions and all were answered. The patient agreed with the plan and demonstrated an understanding of the instructions.   The patient was advised to call back or seek an in-person evaluation if the symptoms worsen or if the condition fails to improve as anticipated.   I provided 21+ minutes of time during this encounter.    Follow up plan: Return in about 7 weeks (around 09/08/2022) for  Hypothyroid.

## 2022-07-21 NOTE — Assessment & Plan Note (Deleted)
Performed on 04/27/22, currently on Levothyroxine.  Continue to collaborate with [facility] team as needed, she wishes PCP to takeover Levothyroxine.

## 2022-07-21 NOTE — Assessment & Plan Note (Signed)
Ongoing, she will scan and send recent labs to PCP via MyChart -- will continue current Levothyroxine dosing and adjust as needed.  Recommend she obtain TSH and Free T4.

## 2022-07-23 ENCOUNTER — Other Ambulatory Visit: Payer: Self-pay | Admitting: Cardiovascular Disease

## 2022-07-23 NOTE — Telephone Encounter (Signed)
last visit 03/17/22--Disposition:   FU with in 6 months no scheduled appt

## 2022-07-27 ENCOUNTER — Telehealth: Payer: Self-pay | Admitting: Nurse Practitioner

## 2022-07-27 NOTE — Telephone Encounter (Signed)
Noted, reviewed and signed.

## 2022-07-27 NOTE — Telephone Encounter (Signed)
Pt came in and stated that she had paperwork for provider to look at.  She stated that she does not need paperwork back that it is her results that provider wanted to review.  Placed in provider's folder.

## 2022-07-29 DIAGNOSIS — M546 Pain in thoracic spine: Secondary | ICD-10-CM | POA: Diagnosis not present

## 2022-07-29 DIAGNOSIS — M9902 Segmental and somatic dysfunction of thoracic region: Secondary | ICD-10-CM | POA: Diagnosis not present

## 2022-07-29 DIAGNOSIS — M9901 Segmental and somatic dysfunction of cervical region: Secondary | ICD-10-CM | POA: Diagnosis not present

## 2022-07-29 DIAGNOSIS — M531 Cervicobrachial syndrome: Secondary | ICD-10-CM | POA: Diagnosis not present

## 2022-07-30 ENCOUNTER — Encounter: Payer: Self-pay | Admitting: Family Medicine

## 2022-07-31 ENCOUNTER — Other Ambulatory Visit: Payer: Self-pay | Admitting: Nurse Practitioner

## 2022-07-31 ENCOUNTER — Encounter: Payer: Self-pay | Admitting: Nurse Practitioner

## 2022-07-31 DIAGNOSIS — E6609 Other obesity due to excess calories: Secondary | ICD-10-CM

## 2022-07-31 NOTE — Telephone Encounter (Signed)
Requested medication (s) are due for refill today: yes  Requested medication (s) are on the active medication list: yes  Last refill:  07/17/22  Future visit scheduled: yes  Notes to clinic:  Unable to refill per protocol, last refill by another provider. Historical provider, routing for review.     Requested Prescriptions  Pending Prescriptions Disp Refills   WEGOVY 2.4 MG/0.75ML SOAJ [Pharmacy Med Name: WEGOVY 2.4 MG/0.75 ML PEN]  4    Sig: INJECT 2.4 MG INTO THE SKIN ONCE A WEEK.     Endocrinology:  Diabetes - GLP-1 Receptor Agonists - semaglutide Passed - 07/31/2022  1:25 AM      Passed - HBA1C in normal range and within 180 days    Hemoglobin A1C  Date Value Ref Range Status  07/23/2020 5.5  Final   Hgb A1c MFr Bld  Date Value Ref Range Status  02/13/2022 5.2 4.8 - 5.6 % Final    Comment:             Prediabetes: 5.7 - 6.4          Diabetes: >6.4          Glycemic control for adults with diabetes: <7.0          Passed - Cr in normal range and within 360 days    Creatinine  Date Value Ref Range Status  06/19/2012 0.77 0.60 - 1.30 mg/dL Final   Creatinine, Ser  Date Value Ref Range Status  02/13/2022 0.69 0.57 - 1.00 mg/dL Final         Passed - Valid encounter within last 6 months    Recent Outpatient Visits           1 week ago Postoperative hypothyroidism   [place] [facility], Corrie Dandy T, NP   2 weeks ago Acute otitis media, bilateral   [place] [facility] [place], Corrie Dandy T, NP   2 months ago Metastatic papillary carcinoma (HCC)   [place] [facility] [place], Jolene T, NP   4 months ago Moderate episode of recurrent major depressive disorder (HCC)   [place] [facility] [place], Jolene T, NP   4 months ago Moderate episode of recurrent major depressive disorder (HCC)   [place] [facility] [place], Dorie Rank, NP       Future Appointments             In 1 month  C[place], Dorie Rank, NP [facility], [facility]

## 2022-08-10 ENCOUNTER — Other Ambulatory Visit: Payer: Self-pay | Admitting: Cardiovascular Disease

## 2022-08-10 ENCOUNTER — Other Ambulatory Visit: Payer: Self-pay | Admitting: Nurse Practitioner

## 2022-08-10 DIAGNOSIS — E559 Vitamin D deficiency, unspecified: Secondary | ICD-10-CM

## 2022-08-11 LAB — BASIC METABOLIC PANEL: Glucose: 86

## 2022-08-11 LAB — VITAMIN B12: Vitamin B-12: 527

## 2022-08-11 LAB — HEMOGLOBIN A1C: Hemoglobin A1C: 5.1

## 2022-08-11 LAB — VITAMIN D 25 HYDROXY (VIT D DEFICIENCY, FRACTURES): Vit D, 25-Hydroxy: 27.4

## 2022-08-11 LAB — TSH: TSH: 14.9 — AB (ref 0.41–5.90)

## 2022-08-11 NOTE — Telephone Encounter (Signed)
Requested medication (s) are due for refill today: yes  Requested medication (s) are on the active medication list: yes  Last refill:  04/02/21  Future visit scheduled: yes  Notes to clinic:   Manual Review: Route requests for 50,000 IU strength to the provider      Requested Prescriptions  Pending Prescriptions Disp Refills   Vitamin D, Ergocalciferol, (DRISDOL) 1.25 MG (50000 UNIT) CAPS capsule [Pharmacy Med Name: VITAMIN D2 1.25MG (50,000 UNIT)] 24 capsule 4    Sig: TAKE 1 CAPSULE BY MOUTH ONE TIME PER WEEK     Endocrinology:  Vitamins - Vitamin D Supplementation 2 Failed - 08/10/2022  9:50 AM      Failed - Manual Review: Route requests for 50,000 IU strength to the provider      Passed - Ca in normal range and within 360 days    Calcium  Date Value Ref Range Status  02/13/2022 9.5 8.7 - 10.2 mg/dL Final   Calcium, Total  Date Value Ref Range Status  06/19/2012 8.5 8.5 - 10.1 mg/dL Final         Passed - Vitamin D in normal range and within 360 days    25-Hydroxy, Vitamin D-3  Date Value Ref Range Status  08/04/2016 30 ng/mL Final    Comment:    (NOTE) Performed At: [facility] [address] [place], [place] [id number] [place] Murrell Redden MD WU:[id number]    25-Hydroxy, Vitamin D-2  Date Value Ref Range Status  08/04/2016 2.1 ng/mL Final   25-Hydroxy, Vitamin D  Date Value Ref Range Status  08/04/2016 32 ng/mL Final    Comment:    (NOTE) Reference Range: All Ages: Target levels 30 - 100    Vit D, 25-Hydroxy  Date Value Ref Range Status  02/13/2022 41.5 30.0 - 100.0 ng/mL Final    Comment:    Vitamin D deficiency has been defined by the Institute of Medicine and an Endocrine Society practice guideline as a level of serum 25-OH vitamin D less than 20 ng/mL (1,2). The Endocrine Society went on to further define vitamin D insufficiency as a level between 21 and 29 ng/mL (2). 1. IOM (Institute of Medicine). 2010. Dietary reference     intakes for calcium and D. [place]: The    [employer]. 2. Holick MF, Binkley , Bischoff-Ferrari HA, et al.    Evaluation, treatment, and prevention of vitamin D    deficiency: an Endocrine Society clinical practice    guideline. JCEM. 2011 Jul; 96(7):1911-30.          Passed - Valid encounter within last 12 months    Recent Outpatient Visits           3 weeks ago Postoperative hypothyroidism   [place] [facility] [place], Riner T, NP   3 weeks ago Acute otitis media, bilateral   [place] [facility] [place], Clayton T, NP   3 months ago Metastatic papillary carcinoma (HCC)   [place] [facility] [place], [place] T, NP   4 months ago Moderate episode of recurrent major depressive disorder (HCC)   [place] [facility] [place], Jolene T, NP   5 months ago Moderate episode of recurrent major depressive disorder (HCC)   [place] [facility] [place], Dorie Rank, NP       Future Appointments             In 4 weeks Cannady, Dorie Rank, NP [place] [facility], [facility]

## 2022-08-13 ENCOUNTER — Encounter: Payer: Self-pay | Admitting: Nurse Practitioner

## 2022-08-13 MED ORDER — LEVOTHYROXINE SODIUM 137 MCG PO TABS: 137.0000 ug | ORAL_TABLET | Freq: Every day | ORAL | 5 refills | Status: DC

## 2022-08-13 NOTE — Addendum Note (Signed)
Addended by: Aura Dials T on: 08/13/2022 12:51 PM   Modules accepted: Orders

## 2022-08-14 NOTE — Addendum Note (Signed)
Addended by: Aura Dials T on: 08/14/2022 04:54 PM   Modules accepted: Orders

## 2022-08-18 DIAGNOSIS — R41840 Attention and concentration deficit: Secondary | ICD-10-CM | POA: Diagnosis not present

## 2022-08-18 DIAGNOSIS — F32A Depression, unspecified: Secondary | ICD-10-CM | POA: Diagnosis not present

## 2022-08-18 DIAGNOSIS — M797 Fibromyalgia: Secondary | ICD-10-CM | POA: Diagnosis not present

## 2022-08-18 DIAGNOSIS — G2581 Restless legs syndrome: Secondary | ICD-10-CM | POA: Diagnosis not present

## 2022-08-19 DIAGNOSIS — M546 Pain in thoracic spine: Secondary | ICD-10-CM | POA: Diagnosis not present

## 2022-08-19 DIAGNOSIS — M9902 Segmental and somatic dysfunction of thoracic region: Secondary | ICD-10-CM | POA: Diagnosis not present

## 2022-08-19 DIAGNOSIS — M9901 Segmental and somatic dysfunction of cervical region: Secondary | ICD-10-CM | POA: Diagnosis not present

## 2022-08-19 DIAGNOSIS — M531 Cervicobrachial syndrome: Secondary | ICD-10-CM | POA: Diagnosis not present

## 2022-08-22 ENCOUNTER — Other Ambulatory Visit: Payer: Self-pay | Admitting: Nurse Practitioner

## 2022-08-25 NOTE — Telephone Encounter (Signed)
Requested Prescriptions  Refused Prescriptions Disp Refills   levothyroxine (SYNTHROID) 112 MCG tablet [Pharmacy Med Name: LEVOTHYROXINE 112 MCG TABLET] 90 tablet     Sig: TAKE 1 TABLET BY MOUTH EVERY DAY     Endocrinology:  Hypothyroid Agents Failed - 08/22/2022  2:50 PM      Failed - TSH in normal range and within 360 days    TSH  Date Value Ref Range Status  08/11/2022 14.90 (A) 0.41 - 5.90 Final  02/13/2022 1.140 0.450 - 4.500 uIU/mL Final         Passed - Valid encounter within last 12 months    Recent Outpatient Visits           1 month ago Postoperative hypothyroidism   [place] [facility] [place], Arimo T, NP   1 month ago Acute otitis media, bilateral   [place] [facility] [place], Kenmar T, NP   3 months ago Metastatic papillary carcinoma (HCC)   [place] [facility] [place], [place] T, NP   4 months ago Moderate episode of recurrent major depressive disorder (HCC)   [place] [facility] [place], Jolene T, NP   5 months ago Moderate episode of recurrent major depressive disorder (HCC)   [place] [facility] [place], Dorie Rank, NP       Future Appointments             In 2 weeks Cannady, Dorie Rank, NP [facility], [facility]

## 2022-09-06 NOTE — Patient Instructions (Signed)

## 2022-09-08 ENCOUNTER — Ambulatory Visit (INDEPENDENT_AMBULATORY_CARE_PROVIDER_SITE_OTHER): Payer: BC Managed Care – PPO | Admitting: Nurse Practitioner

## 2022-09-08 ENCOUNTER — Encounter: Payer: Self-pay | Admitting: Nurse Practitioner

## 2022-09-08 VITALS — BP 128/74 | HR 69 | Temp 98.1°F | Ht 67.99 in | Wt 303.0 lb

## 2022-09-08 DIAGNOSIS — E890 Postprocedural hypothyroidism: Secondary | ICD-10-CM

## 2022-09-08 DIAGNOSIS — M797 Fibromyalgia: Secondary | ICD-10-CM

## 2022-09-08 DIAGNOSIS — E538 Deficiency of other specified B group vitamins: Secondary | ICD-10-CM | POA: Diagnosis not present

## 2022-09-08 DIAGNOSIS — G259 Extrapyramidal and movement disorder, unspecified: Secondary | ICD-10-CM

## 2022-09-08 DIAGNOSIS — C799 Secondary malignant neoplasm of unspecified site: Secondary | ICD-10-CM | POA: Diagnosis not present

## 2022-09-08 DIAGNOSIS — Z6841 Body Mass Index (BMI) 40.0 and over, adult: Secondary | ICD-10-CM

## 2022-09-08 DIAGNOSIS — E66813 Obesity, class 3: Secondary | ICD-10-CM

## 2022-09-08 DIAGNOSIS — G629 Polyneuropathy, unspecified: Secondary | ICD-10-CM

## 2022-09-08 DIAGNOSIS — F331 Major depressive disorder, recurrent, moderate: Secondary | ICD-10-CM | POA: Diagnosis not present

## 2022-09-08 DIAGNOSIS — F418 Other specified anxiety disorders: Secondary | ICD-10-CM

## 2022-09-08 NOTE — Assessment & Plan Note (Signed)
Chronic, ongoing, currently followed by psychiatry -- [facility].  Denies SI/HI.  Continue current medication regimen as ordered by psychiatry and attempt to get notes next visit.  Discussed at length with her today.   

## 2022-09-08 NOTE — Assessment & Plan Note (Signed)
Refer to depression plan of care. 

## 2022-09-08 NOTE — Assessment & Plan Note (Signed)
Chronic, ongoing, followed by neurology at this time.  Recent notes and labs reviewed.  Will continue Gabapentin as ordered by neurology. 

## 2022-09-08 NOTE — Assessment & Plan Note (Signed)
Thyroidectomy performed on 04/27/22, currently on Levothyroxine.  Continue to collaborate with [facility] team as needed.  Thyroid labs today.

## 2022-09-08 NOTE — Progress Notes (Signed)
BP 128/74   Pulse 69   Temp 98.1 F (36.7 C) (Oral)   Ht 5' 7.99" (1.727 m)   Wt (!) 303 lb (137.4 kg)   LMP 02/09/2017 (Within Weeks)   SpO2 98%   BMI 46.08 kg/m    Subjective:    Patient ID: Cathy Mitchell, female    DOB: May 09, 1965, 57 y.o.   MRN: [id number]  HPI: Cathy Mitchell is a 57 y.o. female  Chief Complaint  Patient presents with   Hypothyroidism   Depression   [facility] neurology team for tremor and fibromyalgia. She follows with functional movement disorder clinic at [facility] and saw them on 05/25/22. She is taking Gabapentin 300 MG TID or leg tremors.  MRI performed on 12/11/20 noting chronic small vessel disease.  Pain status: stable Satisfied with current treatment?: no Medication side effects: no Medication compliance: good compliance Duration:  Location:  Quality: dull, aching, and burning Current pain level: mild Previous pain level: mild Aggravating factors: lifting and movement Alleviating factors: Gabapentin Previous pain specialty evaluation: no Non-narcotic analgesic meds: no Narcotic contract:no Treatments attempted: Gabapentin and PT  HYPOTHYROIDISM Diagnosed with thyroid cancer in December 2023.  Thyroid removed on 04/27/22 by Dr. Azucena Fallen at [facility].  She is taking Levothyroxine 137 MCG daily.  Thyroid labs 08/13/22 with TSH 14.900, but no Free T4 obtained.  Goes to [employer], [facility] for free labs.  Continues twice a month B12 injections in office due to low levels + Vitamin D every 3 days.     She is concerned about weight gain.  Was on Wegovy in past, but with thyroid cancer we stopped this -- was able to get to 255 lbs with this.  She is scheduled to see weight management at [facility] tomorrow to talk about weight loss options. Thyroid control status:stable Satisfied with current treatment? yes Medication side effects: no Medication compliance: good compliance Etiology of hypothyroidism: thyroid removal Recent dose  adjustment:no Fatigue: yes Cold intolerance: no Heat intolerance: no Weight gain: yes Weight loss: no Constipation: yes Diarrhea/loose stools: yes Palpitations: no Lower extremity edema: no Anxiety/depressed mood: yes   DEPRESSION/PTSD Continues on Celexa 40 MG daily and Abilify 5 MG daily + Klonopin.  Seen by psychiatry, Melvenia Beam, last visit 2 weeks ago.  Has been doing well with this regimen of pills.  Was working with therapist -- Lolita Cram, via Dr. Maryruth Bun office as well -- for PTSD, but they moved.  Is currently back with this therapist and saw her yesterday. Duration: stable Anxious mood: no Excessive worrying: yes with the weight Irritability: no Sweating: no Nausea: no Palpitations:no Hyperventilation: no Panic attacks: no Agoraphobia: no  Obscessions/compulsions: no Depressed mood: yes    07/17/2022   10:11 AM 03/31/2022    8:39 AM 03/13/2022   10:12 AM 02/13/2022    9:42 AM 02/13/2022    8:35 AM  Depression screen PHQ 2/9  Decreased Interest 0 0 0 0 0  Down, Depressed, Hopeless 0 0 1 0 0  PHQ - 2 Score 0 0 1 0 0  Altered sleeping 3 3 3 3 3   Tired, decreased energy 1 3 3 3 3   Change in appetite 3 3 0 2 2  Feeling bad or failure about yourself  0 0 0 0 0  Trouble concentrating 1 1 3 3 3   Moving slowly or fidgety/restless 1 1  3 3   Suicidal thoughts 0 0 0 0 0  PHQ-9 Score 9 11 10 14 14   Difficult  doing work/chores Somewhat difficult Not difficult at all Somewhat difficult Not difficult at all Somewhat difficult  Anhedonia: no Weight changes: no Insomnia: no Hypersomnia: no Fatigue/loss of energy: yes Feelings of worthlessness: no Feelings of guilt:  no Impaired concentration/indecisiveness: occasional Suicidal ideations: no  Crying spells: yes Recent Stressors/Life Changes: yes   Relationship problems: no   Family stress: yes     Financial stress: no    Job stress: no    Recent death/loss: no    Jul 24, 2022   10:12 AM 03/31/2022    8:39 AM 03/13/2022    10:14 AM 02/13/2022    9:42 AM  GAD 7 : Generalized Anxiety Score  Nervous, Anxious, on Edge 0 0 1 2  Control/stop worrying 0 0 1 2  Worry too much - different things 0 1 2 2   Trouble relaxing 0 3 1 1   Restless 1 3 3 1   Easily annoyed or irritable 0 0 0 1  Afraid - awful might happen 0 0 0 0  Total GAD 7 Score 1 7 8 9   Anxiety Difficulty  Not difficult at all Somewhat difficult Not difficult at all   Relevant past medical, surgical, family and social history reviewed and updated as indicated. Interim medical history since our last visit reviewed. Allergies and medications reviewed and updated.  Review of Systems  Constitutional:  Positive for unexpected weight change (gain since thyroid surgery). Negative for activity change, appetite change, diaphoresis and fatigue.  Respiratory: Negative.    Cardiovascular: Negative.   Gastrointestinal: Negative.   Endocrine: Negative for cold intolerance and heat intolerance.  Neurological: Negative.   Psychiatric/Behavioral:  Positive for decreased concentration. Negative for self-injury, sleep disturbance and suicidal ideas. The patient is nervous/anxious.     Per HPI unless specifically indicated above     Objective:    BP 128/74   Pulse 69   Temp 98.1 F (36.7 C) (Oral)   Ht 5' 7.99" (1.727 m)   Wt (!) 303 lb (137.4 kg)   LMP 02/09/2017 (Within Weeks)   SpO2 98%   BMI 46.08 kg/m   Wt Readings from Last 3 Encounters:  09/08/22 (!) 303 lb (137.4 kg)  07/24/2022 293 lb 14.4 oz (133.3 kg)  03/31/22 270 lb 3.2 oz (122.6 kg)    Physical Exam Vitals and nursing note reviewed.  Constitutional:      General: She is awake. She is not in acute distress.    Appearance: She is well-developed and well-groomed. She is morbidly obese. She is not ill-appearing.  HENT:     Head: Normocephalic.     Right Ear: Hearing normal.     Left Ear: Hearing normal.  Eyes:     General: Lids are normal.        Right eye: No discharge.        Left eye:  No discharge.     Conjunctiva/sclera: Conjunctivae normal.     Pupils: Pupils are equal, round, and reactive to light.  Neck:     Thyroid: No thyromegaly.     Vascular: No carotid bruit or JVD.  Cardiovascular:     Rate and Rhythm: Normal rate and regular rhythm.     Heart sounds: Normal heart sounds. No murmur heard.    No gallop.  Pulmonary:     Effort: Pulmonary effort is normal. No accessory muscle usage or respiratory distress.     Breath sounds: Normal breath sounds.  Abdominal:     General: Bowel sounds are normal.  Palpations: Abdomen is soft.  Musculoskeletal:     Cervical back: Normal range of motion and neck supple.     Right lower leg: No edema.     Left lower leg: No edema.  Lymphadenopathy:     Cervical: No cervical adenopathy.  Skin:    General: Skin is warm and dry.  Neurological:     Mental Status: She is alert and oriented to person, place, and time.     Motor: Tremor present.     Gait: Gait is intact.     Deep Tendon Reflexes:     Reflex Scores:      Brachioradialis reflexes are 1+ on the right side and 1+ on the left side.      Patellar reflexes are 1+ on the right side and 1+ on the left side.    Comments: Bilateral upper extremity tremor L>R -- varies from resting to active tremor.  No rigidity noted or cogwheel.  Normal gait.    Psychiatric:        Attention and Perception: Attention normal.        Mood and Affect: Mood normal.        Speech: Speech normal.        Behavior: Behavior normal. Behavior is cooperative.        Thought Content: Thought content normal.    Results for orders placed or performed in visit on 08/13/22  VITAMIN D 25 Hydroxy (Vit-D Deficiency, Fractures)  Result Value Ref Range   Vit D, 25-Hydroxy 27.4   Basic metabolic panel  Result Value Ref Range   Glucose 86   Vitamin B12  Result Value Ref Range   Vitamin B-12 527   Hemoglobin A1c  Result Value Ref Range   Hemoglobin A1C 5.1   TSH  Result Value Ref Range   TSH  14.90 (A) 0.41 - 5.90      Assessment & Plan:   Problem List Items Addressed This Visit       Endocrine   Hypothyroid    Chronic and ongoing due to thyroid cancer and removal on 04/27/22.  Will continue current Levothyroxine dosing and adjust as needed.  Obtain TSH and Free T4.      Relevant Orders   T4, free   TSH     Nervous and Auditory   Functional movement disorder    Followed by [facility] neurology at this time, continue this collaboration.  Recent notes and MRI reviewed.       Small fiber neuropathy    Diagnosed May 2023.   Continue to collaborate with neurology at [facility], recent notes and labs reviewed.        Other   Fibromyalgia (Chronic)    Chronic, ongoing, followed by neurology at this time.  Recent notes and labs reviewed.  Will continue Gabapentin as ordered by neurology.      B12 deficiency    Chronic, ongoing.  Continue injections of B12 in office, labs today.      Depression - Primary    Chronic, ongoing, currently followed by psychiatry -- [facility].  Denies SI/HI.  Continue current medication regimen as ordered by psychiatry and attempt to get notes next visit.  Discussed at length with her today.        Metastatic papillary carcinoma (HCC)    Thyroidectomy performed on 04/27/22, currently on Levothyroxine.  Continue to collaborate with [facility] team as needed.  Thyroid labs today.      Obesity    BMI 46.08,  no longer on Wegovy due to thyroid cancer.  Scheduled to see [facility] weight management tomorrow, will appreciate their input.  Recommended eating smaller high protein, low fat meals more frequently and exercising 30 mins a day 5 times a week with a goal of 10-15lb weight loss in the next 3 months. Patient voiced their understanding and motivation to adhere to these recommendations.       Situational anxiety    Refer to depression plan of care.        Follow up plan: Return in about 3 months (around 12/09/2022) for Hypothyroid and Weight Loss (always  40 minutes).

## 2022-09-08 NOTE — Assessment & Plan Note (Signed)
Diagnosed May 2023.   Continue to collaborate with neurology at [facility], recent notes and labs reviewed. 

## 2022-09-08 NOTE — Assessment & Plan Note (Signed)
BMI 46.08, no longer on Wegovy due to thyroid cancer.  Scheduled to see [facility] weight management tomorrow, will appreciate their input.  Recommended eating smaller high protein, low fat meals more frequently and exercising 30 mins a day 5 times a week with a goal of 10-15lb weight loss in the next 3 months. Patient voiced their understanding and motivation to adhere to these recommendations.

## 2022-09-08 NOTE — Assessment & Plan Note (Signed)
Chronic and ongoing due to thyroid cancer and removal on 04/27/22.  Will continue current Levothyroxine dosing and adjust as needed.  Obtain TSH and Free T4.

## 2022-09-08 NOTE — Assessment & Plan Note (Signed)
Followed by [facility] neurology at this time, continue this collaboration.  Recent notes and MRI reviewed.  

## 2022-09-08 NOTE — Assessment & Plan Note (Signed)
Chronic, ongoing.  Continue injections of B12 in office, labs today. 

## 2022-09-09 ENCOUNTER — Encounter: Payer: Self-pay | Admitting: Nurse Practitioner

## 2022-09-09 ENCOUNTER — Other Ambulatory Visit: Payer: Self-pay | Admitting: Nurse Practitioner

## 2022-09-09 DIAGNOSIS — Z6841 Body Mass Index (BMI) 40.0 and over, adult: Secondary | ICD-10-CM | POA: Diagnosis not present

## 2022-09-09 DIAGNOSIS — Z79899 Other long term (current) drug therapy: Secondary | ICD-10-CM | POA: Diagnosis not present

## 2022-09-09 DIAGNOSIS — F32A Depression, unspecified: Secondary | ICD-10-CM | POA: Diagnosis not present

## 2022-09-09 DIAGNOSIS — Z48815 Encounter for surgical aftercare following surgery on the digestive system: Secondary | ICD-10-CM | POA: Diagnosis not present

## 2022-09-09 DIAGNOSIS — E890 Postprocedural hypothyroidism: Secondary | ICD-10-CM | POA: Diagnosis not present

## 2022-09-09 DIAGNOSIS — G4730 Sleep apnea, unspecified: Secondary | ICD-10-CM | POA: Diagnosis not present

## 2022-09-09 DIAGNOSIS — Z79890 Hormone replacement therapy (postmenopausal): Secondary | ICD-10-CM | POA: Diagnosis not present

## 2022-09-09 LAB — TSH: TSH: 7.05 u[IU]/mL — ABNORMAL HIGH (ref 0.450–4.500)

## 2022-09-09 LAB — T4, FREE: Free T4: 1.04 ng/dL (ref 0.82–1.77)

## 2022-09-09 MED ORDER — LEVOTHYROXINE SODIUM 150 MCG PO TABS
150.0000 ug | ORAL_TABLET | Freq: Every day | ORAL | 4 refills | Status: DC
Start: 2022-09-09 — End: 2022-10-13

## 2022-09-09 NOTE — Progress Notes (Signed)
Contacted via MyChart -- needs 6 week lab only visit please  Good afternoon Cathy Mitchell, your thyroid labs have returned and TSH is trending down but not at goal.  Free T4 is stable.  I would like to increase your Levothyroxine to 150 MCG daily and recheck labs in 6 weeks outpatient here at office.  Any questions? Keep being awesome!!  Thank you for allowing me to participate in your care.  I appreciate you. Kindest regards, Riyanshi Wahab

## 2022-09-10 ENCOUNTER — Encounter: Payer: Self-pay | Admitting: Nurse Practitioner

## 2022-09-10 DIAGNOSIS — E890 Postprocedural hypothyroidism: Secondary | ICD-10-CM

## 2022-09-10 NOTE — Progress Notes (Signed)
Called and scheduled lab only visit on 10/22/2022 @ 8:20 am.

## 2022-09-16 DIAGNOSIS — M9902 Segmental and somatic dysfunction of thoracic region: Secondary | ICD-10-CM | POA: Diagnosis not present

## 2022-09-16 DIAGNOSIS — M531 Cervicobrachial syndrome: Secondary | ICD-10-CM | POA: Diagnosis not present

## 2022-09-16 DIAGNOSIS — M546 Pain in thoracic spine: Secondary | ICD-10-CM | POA: Diagnosis not present

## 2022-09-16 DIAGNOSIS — M9901 Segmental and somatic dysfunction of cervical region: Secondary | ICD-10-CM | POA: Diagnosis not present

## 2022-09-24 ENCOUNTER — Ambulatory Visit (INDEPENDENT_AMBULATORY_CARE_PROVIDER_SITE_OTHER): Payer: BC Managed Care – PPO | Admitting: Family Medicine

## 2022-09-24 VITALS — BP 118/74 | HR 60 | Temp 98.0°F | Ht 68.5 in | Wt 304.6 lb

## 2022-09-24 DIAGNOSIS — E538 Deficiency of other specified B group vitamins: Secondary | ICD-10-CM

## 2022-09-24 DIAGNOSIS — J069 Acute upper respiratory infection, unspecified: Secondary | ICD-10-CM

## 2022-09-24 DIAGNOSIS — H6506 Acute serous otitis media, recurrent, bilateral: Secondary | ICD-10-CM

## 2022-09-24 DIAGNOSIS — J029 Acute pharyngitis, unspecified: Secondary | ICD-10-CM | POA: Diagnosis not present

## 2022-09-24 MED ORDER — AMOXICILLIN 875 MG PO TABS
875.0000 mg | ORAL_TABLET | Freq: Two times a day (BID) | ORAL | 0 refills | Status: AC
Start: 2022-09-24 — End: 2022-10-04

## 2022-09-24 NOTE — Progress Notes (Signed)
BP 118/74   Pulse 60   Temp 98 F (36.7 C)   Ht 5' 8.5" (1.74 m)   Wt (!) 304 lb 9.6 oz (138.2 kg)   LMP 02/09/2017 (Within Weeks)   SpO2 97%   BMI 45.64 kg/m    Subjective:    Patient ID: Cathy Cathy, female    DOB: 03/24/1966, 57 y.o.   MRN: [id number]  HPI: Cathy Cathy is a 57 y.o. female  Chief Complaint  Patient presents with   Ear Pain    Started 2 days ago, sore throat started yesterday, woke up in the middle of the night with ear pain. She saves x3 tablets of Amoxicillin and she has taken 3 yesterday.    EAR PAIN She denies sick exposures but did go to the gym.  Duration:2 days Involved ear(s): bilateral Severity:  8/10  Quality:  sharp Fever: no Otorrhea: yes Upper respiratory infection symptoms: yes, sore throat Pruritus: yes, has history of eczema in ear Hearing loss: no Water immersion no Using Q-tips: yes Recurrent otitis media: yes Status: stable Treatments attempted:  x3 tablets of previous prescription of Amoxicillin 875 mg  UPPER RESPIRATORY TRACT INFECTION Worst symptom:Ear pain Fever: no Cough: yes Shortness of breath: no Wheezing: no Chest pain: no Chest tightness: no Chest congestion: no Nasal congestion: yes Runny nose: no Post nasal drip: no Sneezing: yes Sore throat: yes Swollen glands: no Sinus pressure: yes Headache: yes Face pain: yes Toothache: no Ear pain: yes bilateral Ear pressure: yes bilateral Eyes red/itching:yes Eye drainage/crusting: yes  Vomiting: no Rash: no Fatigue: yes Sick contacts:  unknown Strep contacts: no  Context: stable Recurrent sinusitis: no Relief with OTC cold/cough medications: no  Treatments attempted: cough syrup and antibiotics  (Robitussin)  Relevant past medical, surgical, family and social history reviewed and updated as indicated. Interim medical history since our last visit reviewed. Allergies and medications reviewed and updated.  Review of Systems  Constitutional:   Positive for fatigue. Negative for chills and fever.  HENT:  Positive for congestion, ear discharge, ear pain, rhinorrhea, sinus pressure, sneezing and sore throat. Negative for hearing loss, postnasal drip and sinus pain.   Eyes:  Positive for discharge and itching. Negative for redness.  Respiratory: Negative.  Negative for chest tightness, shortness of breath and wheezing.   Cardiovascular: Negative.  Negative for chest pain.  Gastrointestinal:  Negative for vomiting.  Skin:  Negative for rash.  Neurological:  Positive for headaches.    Per HPI unless specifically indicated above     Objective:    BP 118/74   Pulse 60   Temp 98 F (36.7 C)   Ht 5' 8.5" (1.74 m)   Wt (!) 304 lb 9.6 oz (138.2 kg)   LMP 02/09/2017 (Within Weeks)   SpO2 97%   BMI 45.64 kg/m   Wt Readings from Last 3 Encounters:  09/24/22 (!) 304 lb 9.6 oz (138.2 kg)  09/08/22 (!) 303 lb (137.4 kg)  07/17/22 293 lb 14.4 oz (133.3 kg)    Physical Exam Vitals and nursing note reviewed.  Constitutional:      General: She is awake. She is not in acute distress.    Appearance: Normal appearance. She is well-developed and well-groomed. She is not ill-appearing, toxic-appearing or diaphoretic.  HENT:     Head: Normocephalic and atraumatic.     Right Ear: Hearing, ear canal and external ear normal. Drainage and tenderness present. A middle ear effusion is present. There is no impacted  cerumen. Tympanic membrane is not erythematous.     Left Ear: Hearing, ear canal and external ear normal. Drainage and tenderness present. A middle ear effusion is present. There is no impacted cerumen. Tympanic membrane is not erythematous.     Nose: Congestion and rhinorrhea present.     Right Turbinates: Pale.     Left Turbinates: Pale.     Right Sinus: Maxillary sinus tenderness present.     Left Sinus: Maxillary sinus tenderness present.     Mouth/Throat:     Mouth: Mucous membranes are moist.     Pharynx: Oropharynx is clear.  No oropharyngeal exudate or posterior oropharyngeal erythema.     Tonsils: No tonsillar exudate.  Eyes:     General: Lids are normal. No scleral icterus.       Right eye: No discharge.        Left eye: No discharge.     Extraocular Movements: Extraocular movements intact.     Conjunctiva/sclera: Conjunctivae normal.     Pupils: Pupils are equal, round, and reactive to light.  Neck:     Vascular: No carotid bruit.  Cardiovascular:     Rate and Rhythm: Normal rate and regular rhythm.     Pulses: Normal pulses.     Heart sounds: Normal heart sounds, S1 normal and S2 normal. No murmur heard.    No friction rub. No gallop.  Pulmonary:     Effort: Pulmonary effort is normal. No accessory muscle usage or respiratory distress.     Breath sounds: Normal breath sounds. No stridor. No wheezing, rhonchi or rales.  Chest:     Chest wall: No tenderness.  Musculoskeletal:        General: Normal range of motion.     Cervical back: Full passive range of motion without pain, normal range of motion and neck supple. No rigidity. No muscular tenderness.     Right lower leg: No edema.     Left lower leg: No edema.  Lymphadenopathy:     Cervical: Cervical adenopathy present.  Skin:    General: Skin is warm and dry.     Capillary Refill: Capillary refill takes less than 2 seconds.     Coloration: Skin is not jaundiced or pale.     Findings: No bruising, erythema, lesion or rash.  Neurological:     General: No focal deficit present.     Mental Status: She is alert and oriented to person, place, and time. Mental status is at baseline.     Cranial Nerves: No cranial nerve deficit.     Sensory: No sensory deficit.     Motor: No weakness.     Coordination: Coordination normal.     Gait: Gait normal.     Deep Tendon Reflexes: Reflexes normal.  Psychiatric:        Attention and Perception: Attention normal.        Mood and Affect: Mood normal.        Speech: Speech normal.        Behavior: Behavior  normal. Behavior is cooperative.        Thought Content: Thought content normal.        Judgment: Judgment normal.     Results for orders placed or performed in visit on 09/24/22  Rapid Strep screen([facility])   Specimen: Other   Other  Result Value Ref Range   Strep Gp A Ag, IA W/Reflex Negative Negative  Culture, Group A Strep   Other  Result Value Ref  Range   Strep A Culture WILL FOLLOW       Assessment & Plan:   Problem List Items Addressed This Visit     B12 deficiency    Chronic, ongoing. Continue injections of B12 in office. Given today.       Other Visit Diagnoses     Recurrent acute serous otitis media of both ears    -  Primary   Chronic, ongoing. Amoxicillini 875mg  BID for 10 days. Retrun if symptoms worsening.   Relevant Medications   amoxicillin (AMOXIL) 875 MG tablet   Acute URI       Acute, ongoing. COVID done, strep test negative today. Awaiting COVID. Recommend increased fluid intake.   Relevant Orders   Rapid Strep screen([facility]) (Completed)   Novel Coronavirus, NAA ([facility])        Follow up plan: Return in about 2 months (around 12/09/2022) for Hypothyroid and weight loss.

## 2022-09-24 NOTE — Assessment & Plan Note (Addendum)
Chronic, ongoing. Continue injections of B12 in office. Given today.

## 2022-09-26 LAB — CULTURE, GROUP A STREP

## 2022-09-26 LAB — NOVEL CORONAVIRUS, NAA: SARS-CoV-2, NAA: NOT DETECTED

## 2022-09-26 LAB — RAPID STREP SCREEN (MED CTR MEBANE ONLY): Strep Gp A Ag, IA W/Reflex: NEGATIVE

## 2022-09-28 ENCOUNTER — Telehealth: Payer: BC Managed Care – PPO | Admitting: Nurse Practitioner

## 2022-09-28 NOTE — Progress Notes (Signed)
Hi Cathy Mitchell, your COVID and strep culture came back negative. Thank you for allowing me to participate in your care.

## 2022-10-06 DIAGNOSIS — M9901 Segmental and somatic dysfunction of cervical region: Secondary | ICD-10-CM | POA: Diagnosis not present

## 2022-10-06 DIAGNOSIS — M531 Cervicobrachial syndrome: Secondary | ICD-10-CM | POA: Diagnosis not present

## 2022-10-06 DIAGNOSIS — M9902 Segmental and somatic dysfunction of thoracic region: Secondary | ICD-10-CM | POA: Diagnosis not present

## 2022-10-06 DIAGNOSIS — M546 Pain in thoracic spine: Secondary | ICD-10-CM | POA: Diagnosis not present

## 2022-10-07 ENCOUNTER — Other Ambulatory Visit: Payer: Self-pay | Admitting: Nurse Practitioner

## 2022-10-07 DIAGNOSIS — F32A Depression, unspecified: Secondary | ICD-10-CM | POA: Diagnosis not present

## 2022-10-07 DIAGNOSIS — M797 Fibromyalgia: Secondary | ICD-10-CM | POA: Diagnosis not present

## 2022-10-07 DIAGNOSIS — G2581 Restless legs syndrome: Secondary | ICD-10-CM | POA: Diagnosis not present

## 2022-10-07 DIAGNOSIS — R41840 Attention and concentration deficit: Secondary | ICD-10-CM | POA: Diagnosis not present

## 2022-10-08 ENCOUNTER — Other Ambulatory Visit: Payer: Self-pay | Admitting: Nurse Practitioner

## 2022-10-08 NOTE — Telephone Encounter (Signed)
Requested Prescriptions  Pending Prescriptions Disp Refills   levothyroxine (SYNTHROID) 150 MCG tablet [Pharmacy Med Name: LEVOTHYROXINE 150 MCG TABLET] 90 tablet 1    Sig: TAKE 1 TABLET BY MOUTH EVERY DAY     Endocrinology:  Hypothyroid Agents Failed - 10/07/2022  3:20 PM      Failed - TSH in normal range and within 360 days    TSH  Date Value Ref Range Status  09/08/2022 7.050 (H) 0.450 - 4.500 uIU/mL Final         Passed - Valid encounter within last 12 months    Recent Outpatient Visits           2 weeks ago Recurrent acute serous otitis media of both ears   [place] [facility] [place], Sherran Needs, NP   1 month ago Moderate episode of recurrent major depressive disorder (HCC)   [place] [facility] [place], Corrie Dandy T, NP   2 months ago Postoperative hypothyroidism   [place] [facility] [place], [place] T, NP   2 months ago Acute otitis media, bilateral   [place] [facility] [place], Corrie Dandy T, NP   5 months ago Metastatic papillary carcinoma (HCC)   [place] [facility] [place], Dorie Rank, NP       Future Appointments             In 1 month Cannady, Dorie Rank, NP [place] [facility]

## 2022-10-09 NOTE — Telephone Encounter (Signed)
Unable to refill per protocol, Rx request is too soon. Last refill 09/09/22 for 30 and 4 refill.  Requested Prescriptions  Pending Prescriptions Disp Refills   levothyroxine (SYNTHROID) 150 MCG tablet [Pharmacy Med Name: LEVOTHYROXINE 150 MCG TABLET] 90 tablet 1    Sig: TAKE 1 TABLET BY MOUTH EVERY DAY     Endocrinology:  Hypothyroid Agents Failed - 10/08/2022  1:06 PM      Failed - TSH in normal range and within 360 days    TSH  Date Value Ref Range Status  09/08/2022 7.050 (H) 0.450 - 4.500 uIU/mL Final         Passed - Valid encounter within last 12 months    Recent Outpatient Visits           2 weeks ago Recurrent acute serous otitis media of both ears   [place] [facility] [place], Sherran Needs, NP   1 month ago Moderate episode of recurrent major depressive disorder (HCC)   [place] [facility] [place], Corrie Dandy T, NP   2 months ago Postoperative hypothyroidism   [place] [facility] [place], [place] T, NP   2 months ago Acute otitis media, bilateral   [place] [facility] [place], Corrie Dandy T, NP   5 months ago Metastatic papillary carcinoma (HCC)   [place] [facility] [place], Dorie Rank, NP       Future Appointments             In 1 month Cannady, Dorie Rank, NP  [facility], [facility]

## 2022-10-13 MED ORDER — GABAPENTIN 300 MG PO CAPS: 300.0000 mg | ORAL_CAPSULE | Freq: Three times a day (TID) | ORAL | 4 refills | Status: DC

## 2022-10-13 MED ORDER — LEVOTHYROXINE SODIUM 150 MCG PO TABS: 150.0000 ug | ORAL_TABLET | Freq: Every day | ORAL | 4 refills | Status: DC

## 2022-10-19 DIAGNOSIS — Z471 Aftercare following joint replacement surgery: Secondary | ICD-10-CM | POA: Diagnosis not present

## 2022-10-19 DIAGNOSIS — Z96653 Presence of artificial knee joint, bilateral: Secondary | ICD-10-CM | POA: Diagnosis not present

## 2022-10-19 MED ORDER — GABAPENTIN 300 MG PO CAPS: 300.0000 mg | ORAL_CAPSULE | Freq: Three times a day (TID) | ORAL | 4 refills | Status: DC

## 2022-10-19 NOTE — Addendum Note (Signed)
Addended by: Aura Dials T on: 10/19/2022 10:23 AM   Modules accepted: Orders

## 2022-10-22 ENCOUNTER — Other Ambulatory Visit: Payer: BC Managed Care – PPO

## 2022-10-22 DIAGNOSIS — R41840 Attention and concentration deficit: Secondary | ICD-10-CM | POA: Diagnosis not present

## 2022-10-22 DIAGNOSIS — M797 Fibromyalgia: Secondary | ICD-10-CM | POA: Diagnosis not present

## 2022-10-22 DIAGNOSIS — E890 Postprocedural hypothyroidism: Secondary | ICD-10-CM | POA: Diagnosis not present

## 2022-10-22 DIAGNOSIS — G2581 Restless legs syndrome: Secondary | ICD-10-CM | POA: Diagnosis not present

## 2022-10-22 DIAGNOSIS — F32A Depression, unspecified: Secondary | ICD-10-CM | POA: Diagnosis not present

## 2022-10-23 ENCOUNTER — Other Ambulatory Visit: Payer: Self-pay | Admitting: Nurse Practitioner

## 2022-10-23 DIAGNOSIS — E890 Postprocedural hypothyroidism: Secondary | ICD-10-CM

## 2022-10-23 MED ORDER — LEVOTHYROXINE SODIUM 175 MCG PO TABS: 175.0000 ug | ORAL_TABLET | Freq: Every day | ORAL | 1 refills | Status: DC

## 2022-10-23 NOTE — Progress Notes (Signed)
Contacted via MyChart -- need 6 week lab only visit please:)   Good afternoon Cathy Mitchell, your thyroid labs have returned and are still elevated.  Ensure you continue to take Levothyroxine every morning 30 minute to an hour before eating or other medications.  I am increasing dose to 175 MCG daily and we will recheck labs in 6 weeks outpatient.  Any questions? Keep being stellar!!  Thank you for allowing me to participate in your care.  I appreciate you. Kindest regards, Damione Robideau

## 2022-10-26 ENCOUNTER — Encounter: Payer: Self-pay | Admitting: Medical

## 2022-10-26 ENCOUNTER — Ambulatory Visit: Payer: BC Managed Care – PPO | Attending: Medical | Admitting: Medical

## 2022-10-26 VITALS — BP 110/74 | HR 71 | Ht 69.0 in | Wt 309.2 lb

## 2022-10-26 DIAGNOSIS — E890 Postprocedural hypothyroidism: Secondary | ICD-10-CM | POA: Diagnosis not present

## 2022-10-26 DIAGNOSIS — C73 Malignant neoplasm of thyroid gland: Secondary | ICD-10-CM | POA: Diagnosis not present

## 2022-10-26 DIAGNOSIS — G4730 Sleep apnea, unspecified: Secondary | ICD-10-CM | POA: Diagnosis not present

## 2022-10-26 DIAGNOSIS — G4733 Obstructive sleep apnea (adult) (pediatric): Secondary | ICD-10-CM | POA: Diagnosis not present

## 2022-10-26 DIAGNOSIS — F32A Depression, unspecified: Secondary | ICD-10-CM | POA: Diagnosis not present

## 2022-10-26 DIAGNOSIS — I493 Ventricular premature depolarization: Secondary | ICD-10-CM | POA: Diagnosis not present

## 2022-10-26 MED ORDER — DILTIAZEM HCL 120 MG PO TABS: 120.0000 mg | ORAL_TABLET | Freq: Three times a day (TID) | ORAL | 3 refills | Status: DC

## 2022-10-26 NOTE — Patient Instructions (Signed)
Medication Instructions:  Your physician recommends that you continue on your current medications as directed. Please refer to the Current Medication list given to you today.  *If you need a refill on your cardiac medications before your next appointment, please call your pharmacy*   Lab Work: None ordered today   Testing/Procedures: None ordered today   Follow-Up: At [facility], you and your health needs are our priority.  As part of our continuing mission to provide you with exceptional heart care, we have created designated Provider Care Teams.  These Care Teams include your primary Cardiologist (physician) and Advanced Practice Providers (APPs -  Physician Assistants and Nurse Practitioners) who all work together to provide you with the care you need, when you need it.  We recommend signing up for the patient portal called "MyChart".  Sign up information is provided on this After Visit Summary.  MyChart is used to connect with patients for Virtual Visits (Telemedicine).  Patients are able to view lab/test results, encounter notes, upcoming appointments, etc.  Non-urgent messages can be sent to your provider as well.   To learn more about what you can do with MyChart, go to [email or web address].    Your next appointment:   6 months   Provider:   You may see Muhammad Arida, MD or one of the following Advanced Practice Providers on your designated Care Team:   Christopher Berge, NP Ryan Dunn, PA-C Cadence Furth, PA-C Sheri Hammock, NP    

## 2022-10-26 NOTE — Progress Notes (Signed)
Pt has an appointment on 11/24/2022 @ 8:00 am, can she get labs then or need a separate appointment for labs.  Please advise.

## 2022-10-26 NOTE — Progress Notes (Signed)
Cardiology Office Note:    Date:  10/26/2022   ID:  Cathy Mitchell, DOB 10/19/1965, MRN [id number]  PCP:  Marjie Skiff, NP  [facility] Cardiologist:  Lorine Bears, MD  [facility] Electrophysiologist:  None   Referring MD: Marjie Skiff, NP   Chief Complaint: 6 month follow-up  History of Present Illness:    Cathy Mitchell is a 57 y.o. female with a hx of symptomatic PVCs, OSA, thyroid cancer, gastric bypass surgery who presents for follow-up.   Previous treadmill stress test in 2016 before gastric bypass surgery was normal. Holter monitor in December 2018 showed frequent PVCs with a total of 24,000 beats in 48 hours representing 12% burden.  Echo showed normal LV function with mild MR.  Her PVCs responded to diltiazem.  Most recent outpatient heart monitor 05/2021 showed improvement of PVCs to 2.9%.  Patient was last seen in December 2023 and overall doing much better on diltiazem extended release 123 times a day.  Today, the patient reports symptoms are resolved with the diltiazem three times a day. She denies palpitations, shortness of breath, chest pain, lower leg edema, lightheadedness or dizziness. She has been diagnosed with thyroid cancer and reports that her thyroid function is up and down.   Past Medical History:  Diagnosis Date   Anxiety    Arthritis    Back pain    Dementia (HCC)    Depression    Fibromyalgia    Food allergy    [place]   Hypertension    no longer has hypertension   Joint pain    Kidney problem    Neuromuscular disorder (HCC)    L-sided tremors   Osteoarthritis    Other fatigue    Rheumatoid arthritis (HCC)    Sleep apnea    does not use C-PAP    Past Surgical History:  Procedure Laterality Date   CARPAL TUNNEL RELEASE Bilateral 2007   DILATION AND CURETTAGE OF UTERUS     GANGLION CYST EXCISION Bilateral    GANGLION CYST EXCISION Right 10/08/2015   Procedure: REMOVAL GANGLION OF WRIST;  Surgeon: Kennedy Bucker, MD;  Location:  [facility] OR[facility];  Service: Orthopedics;  Laterality: Right;   GASTRIC BYPASS     HAND SURGERY Bilateral Resection   HIP SURGERY     HYSTEROSCOPY WITH D & C N/A 05/24/2017   Procedure: DILATATION AND CURETTAGE /HYSTEROSCOPY;  Surgeon: Defrancesco, Prentice Docker, MD;  Location: [facility] OR[facility];  Service: Gynecology;  Laterality: N/A;   INCONTINENCE SURGERY  2009   LEEP N/A 05/24/2017   Procedure: LOOP ELECTROSURGICAL EXCISION PROCEDURE (LEEP);  Surgeon: Herold Harms, MD;  Location: [facility] OR[facility];  Service: Gynecology;  Laterality: N/A;   TUBAL LIGATION      Current Medications: Current Meds  Medication Sig   acetaminophen (TYLENOL) 500 MG tablet Take 1,500 mg by mouth daily as needed for mild pain.   ARIPiprazole (ABILIFY) 5 MG tablet Take 1 tablet (5 mg total) by mouth daily.   Calcium Carb-Cholecalciferol (CALCIUM HIGH POTENCY/VITAMIN D) 600-5 MG-MCG TABS Take by mouth.   celecoxib (CELEBREX) 100 MG capsule TAKE 1 CAPSULE (100 MG TOTAL) BY MOUTH DAILY AS NEEDED FOR MODERATE PAIN.   citalopram (CELEXA) 40 MG tablet Take 1 tablet (40 mg total) by mouth daily.   clonazePAM (KLONOPIN) 0.5 MG tablet Take 0.5 mg by mouth 2 (two) times daily as needed for anxiety.   diclofenac Sodium (VOLTAREN) 1 % GEL Apply 2 g topically 4 (four) times daily.  dicyclomine (BENTYL) 20 MG tablet Take 1 tablet (20 mg total) by mouth 3 (three) times daily. (Patient taking differently: Take 20 mg by mouth daily as needed.)   gabapentin (NEURONTIN) 300 MG capsule Take 1 capsule (300 mg total) by mouth 3 (three) times daily.   levothyroxine (SYNTHROID) 175 MCG tablet Take 1 tablet (175 mcg total) by mouth daily.   MAGNESIUM PO Take by mouth.   meclizine (ANTIVERT) 12.5 MG tablet Take 1 tablet (12.5 mg total) by mouth 3 (three) times daily as needed for dizziness.   mometasone (ELOCON) 0.1 % lotion 2 (two) times daily.   topiramate (TOPAMAX) 100 MG tablet Take 100 mg by mouth at bedtime.   Vitamin D, Ergocalciferol, (DRISDOL) 1.25  MG (50000 UNIT) CAPS capsule TAKE 1 CAPSULE BY MOUTH ONE TIME PER WEEK   ZEPBOUND 2.5 MG/0.5ML Pen SMARTSIG:0.5 Milliliter(s) SUB-Q Once a Week   [DISCONTINUED] diltiazem (CARDIZEM) 120 MG tablet TAKE 1 TABLET (120 MG TOTAL) BY MOUTH 3 (THREE) TIMES DAILY.   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  Tobacco Use   Smoking status: Former    Current packs/day: 0.00    Average packs/day: 0.5 packs/day for 29.0 years (14.5 ttl pk-yrs)    Types: Cigarettes    Start date: 01/28/1986    Quit date: 01/29/2015    Years since quitting: 7.7   Smokeless tobacco: Never  Vaping Use   Vaping status: Never Used  Substance and Sexual Activity   Alcohol use: No    Alcohol/week: 0.0 standard drinks of alcohol   Drug use: No   Sexual activity: Yes  Other Topics Concern   Not on file  Social History Narrative   Not on file   Social Determinants of Health   Financial Resource Strain: Low Risk  (09/04/2022)   Overall Financial Resource Strain (CARDIA)    Difficulty of Paying Living Expenses: Not hard at all  Food Insecurity: No Food Insecurity (09/04/2022)   Hunger Vital Sign    Worried About Running Out of Food in the Last Year: Never true    Ran Out of Food in the Last Year: Never true  Transportation Needs: No Transportation Needs (09/04/2022)   PRAPARE - Administrator, Civil Service (Medical): No    Lack of Transportation (Non-Medical): No  Physical Activity: Unknown (09/04/2022)   Exercise Vital Sign    Days of  Exercise per Week: 0 days    Minutes of Exercise per Session: Not on file  Stress: No Stress Concern Present (09/04/2022)   [employer] of Occupational Health - Occupational Stress Questionnaire    Feeling of Stress : Only a little  Social Connections: Unknown (09/04/2022)   Social Connection and Isolation Panel [NHANES]    Frequency of Communication with Friends and Family: Patient declined    Frequency of Social Gatherings with Friends and Family: Patient declined    Attends Religious Services: Patient declined    Database administrator or Organizations: No    Attends Engineer, structural: Not  on file    Marital Status: Married     Family History: The patient's family history includes Anxiety disorder in her father and mother; COPD in her father; Depression in her father and mother; Diabetes in her paternal grandfather; Eczema in her son; Heart disease in her maternal grandfather and paternal grandfather; Hypertension in her father and mother; Obesity in her father and mother; Thyroid disease in her daughter. There is no history of Breast cancer.  ROS:   Please see the history of present illness.     All other systems reviewed and are negative.  EKGs/Labs/Other Studies Reviewed:    The following studies were reviewed today:  Echo 2018 Study Conclusions   - Left ventricle: The cavity size was normal. Systolic function was    normal. The estimated ejection fraction was in the range of 60%    to 65%. Wall motion was normal; there were no regional wall    motion abnormalities. Doppler parameters are consistent with    abnormal left ventricular relaxation (grade 1 diastolic    dysfunction).  - Mitral valve: There was mild regurgitation.  - Left atrium: The atrium was mildly dilated.  - Right ventricle: Systolic function was normal.  - Pulmonary arteries: Systolic pressure was within the normal    range.   Impressions:   - Frequent PVCs noted.   Heart monitor  10/2021 Patch Wear Time:  4 days and 13 hours (2023-08-02T05:43:16-0400 to 2023-08-06T19:27:21-0400)   Patient had a min HR of 54 bpm, max HR of 144 bpm, and avg HR of 74 bpm.  Predominant underlying rhythm was Sinus Rhythm.  3 Supraventricular Tachycardia runs occurred, the run with the fastest interval lasting 5 beats with a max rate of 130 bpm, the  longest lasting 8 beats with an avg rate of 102 bpm.  Occasional PVCs with a burden of 2.9%.  Heart monitor 2022 Patch Wear Time:  14 days and 0 hours (2022-03-10T14:42:15-0500 to 2022-03-24T15:42:19-0400)   Patient had a min HR of 55 bpm, max HR of 148 bpm, and avg HR of 84 bpm. Predominant underlying rhythm was Sinus Rhythm.  4 Supraventricular Tachycardia runs occurred, the run with the fastest interval lasting 5 beats with a max rate of 148 bpm, the longest lasting 6 beats with an avg rate of 114 bpm. Frequent PVCs with a burden of 13.6%.   Multiple triggered events by the patient did not correlate with arrhythmia but some did correlate with PVCs.  EKG:  EKG is ordered today.  The ekg ordered today demonstrates NSR 71bpm, low voltage, iRBBB, nonspecific T wave changes  Recent Labs: 02/13/2022: ALT 10; BUN 16; Creatinine, Ser 0.69; Hemoglobin 13.9; Platelets 389; Potassium 4.9; Sodium 145 10/22/2022: TSH 11.300  Recent Lipid Panel    Component Value Date/Time   CHOL 160 12/12/2021 1012   CHOL 145 07/31/2016 1110   TRIG 87 12/12/2021 1012   HDL 60 12/12/2021 1012   HDL 48 07/31/2016 1110   CHOLHDL 2.7 12/12/2021 1012   VLDL 17 12/12/2021 1012   LDLCALC 83 12/12/2021 1012   LDLCALC 73 07/31/2016 1110   Physical Exam:    VS:  BP 110/74   Pulse 71   Ht 5\' 9"  (1.753 m)   Wt (!) 309 lb 3.2 oz (140.3 kg)   LMP 02/09/2017 (Within Weeks)   SpO2 96%   BMI 45.66 kg/m     Wt Readings from Last 3 Encounters:  10/26/22 (!) 309 lb 3.2 oz (140.3 kg)  09/24/22 Marland Kitchen)  304 lb 9.6 oz (138.2 kg)  09/08/22 (!) 303 lb (137.4 kg)     GEN:   Well nourished, well developed in no acute distress HEENT: Normal NECK: No JVD; No carotid bruits LYMPHATICS: No lymphadenopathy CARDIAC: RRR, no murmurs, rubs, gallops RESPIRATORY:  Clear to auscultation without rales, wheezing or rhonchi  ABDOMEN: Soft, non-tender, non-distended MUSCULOSKELETAL:  No edema; No deformity  SKIN: Warm and dry NEUROLOGIC:  Alert and oriented x 3 PSYCHIATRIC:  Normal affect   ASSESSMENT:    1. PVC's (premature ventricular contractions)   2. Thyroid cancer (HCC)   3. OSA (obstructive sleep apnea)    PLAN:    In order of problems listed above:  PVCs Patient reports symptoms are much improved on diltiazem 3 times a day.  She is requesting a refill.  I will refill Cardizem 120 mg 3 times a day.  EKG today with no PVCs.  Thyroid cancer Patient is now status post thyroidectomy.  She is on Synthroid, followed by oncology.  OSA Patient uses CPAP.  Disposition: Follow up in 6 month(s) with MD/APP  Signed, Sade Hollon David Stall, PA-C  10/26/2022 8:25 AM    [facility]

## 2022-11-04 DIAGNOSIS — M531 Cervicobrachial syndrome: Secondary | ICD-10-CM | POA: Diagnosis not present

## 2022-11-04 DIAGNOSIS — M9901 Segmental and somatic dysfunction of cervical region: Secondary | ICD-10-CM | POA: Diagnosis not present

## 2022-11-04 DIAGNOSIS — M9902 Segmental and somatic dysfunction of thoracic region: Secondary | ICD-10-CM | POA: Diagnosis not present

## 2022-11-04 DIAGNOSIS — M546 Pain in thoracic spine: Secondary | ICD-10-CM | POA: Diagnosis not present

## 2022-11-05 DIAGNOSIS — M797 Fibromyalgia: Secondary | ICD-10-CM | POA: Diagnosis not present

## 2022-11-05 DIAGNOSIS — F32A Depression, unspecified: Secondary | ICD-10-CM | POA: Diagnosis not present

## 2022-11-05 DIAGNOSIS — G2581 Restless legs syndrome: Secondary | ICD-10-CM | POA: Diagnosis not present

## 2022-11-05 DIAGNOSIS — R41840 Attention and concentration deficit: Secondary | ICD-10-CM | POA: Diagnosis not present

## 2022-11-11 DIAGNOSIS — M9902 Segmental and somatic dysfunction of thoracic region: Secondary | ICD-10-CM | POA: Diagnosis not present

## 2022-11-11 DIAGNOSIS — M546 Pain in thoracic spine: Secondary | ICD-10-CM | POA: Diagnosis not present

## 2022-11-11 DIAGNOSIS — M531 Cervicobrachial syndrome: Secondary | ICD-10-CM | POA: Diagnosis not present

## 2022-11-11 DIAGNOSIS — M9901 Segmental and somatic dysfunction of cervical region: Secondary | ICD-10-CM | POA: Diagnosis not present

## 2022-11-19 DIAGNOSIS — M797 Fibromyalgia: Secondary | ICD-10-CM | POA: Diagnosis not present

## 2022-11-19 DIAGNOSIS — F32A Depression, unspecified: Secondary | ICD-10-CM | POA: Diagnosis not present

## 2022-11-19 DIAGNOSIS — R41840 Attention and concentration deficit: Secondary | ICD-10-CM | POA: Diagnosis not present

## 2022-11-19 DIAGNOSIS — G2581 Restless legs syndrome: Secondary | ICD-10-CM | POA: Diagnosis not present

## 2022-11-21 NOTE — Patient Instructions (Signed)

## 2022-11-24 ENCOUNTER — Encounter: Payer: Self-pay | Admitting: Nurse Practitioner

## 2022-11-24 ENCOUNTER — Ambulatory Visit (INDEPENDENT_AMBULATORY_CARE_PROVIDER_SITE_OTHER): Payer: BC Managed Care – PPO | Admitting: Nurse Practitioner

## 2022-11-24 VITALS — BP 109/73 | HR 69 | Temp 97.7°F | Wt 312.6 lb

## 2022-11-24 DIAGNOSIS — E890 Postprocedural hypothyroidism: Secondary | ICD-10-CM

## 2022-11-24 DIAGNOSIS — C799 Secondary malignant neoplasm of unspecified site: Secondary | ICD-10-CM | POA: Diagnosis not present

## 2022-11-24 DIAGNOSIS — Z85850 Personal history of malignant neoplasm of thyroid: Secondary | ICD-10-CM

## 2022-11-24 DIAGNOSIS — Z23 Encounter for immunization: Secondary | ICD-10-CM | POA: Diagnosis not present

## 2022-11-24 DIAGNOSIS — G259 Extrapyramidal and movement disorder, unspecified: Secondary | ICD-10-CM

## 2022-11-24 DIAGNOSIS — F900 Attention-deficit hyperactivity disorder, predominantly inattentive type: Secondary | ICD-10-CM

## 2022-11-24 DIAGNOSIS — E538 Deficiency of other specified B group vitamins: Secondary | ICD-10-CM | POA: Diagnosis not present

## 2022-11-24 DIAGNOSIS — I493 Ventricular premature depolarization: Secondary | ICD-10-CM | POA: Diagnosis not present

## 2022-11-24 DIAGNOSIS — F331 Major depressive disorder, recurrent, moderate: Secondary | ICD-10-CM

## 2022-11-24 DIAGNOSIS — M797 Fibromyalgia: Secondary | ICD-10-CM

## 2022-11-24 DIAGNOSIS — G629 Polyneuropathy, unspecified: Secondary | ICD-10-CM

## 2022-11-24 MED ORDER — CYANOCOBALAMIN 1000 MCG/ML IJ SOLN
1000.0000 ug | Freq: Once | INTRAMUSCULAR | Status: AC
Start: 2022-11-24 — End: 2022-11-24
  Administered 2022-11-24: 1000 ug via INTRAMUSCULAR

## 2022-11-24 NOTE — Assessment & Plan Note (Signed)
Previously followed by [facility] with psychiatry -- at this time continue current medication regimen as prescribed by them.

## 2022-11-24 NOTE — Assessment & Plan Note (Addendum)
Followed by [facility] neurology at this time, continue this collaboration as needed.  Recent notes and MRI reviewed.

## 2022-11-24 NOTE — Assessment & Plan Note (Signed)
Diagnosed May 2023.   Continue to collaborate with neurology at [facility] as needed, recent notes and labs reviewed.

## 2022-11-24 NOTE — Assessment & Plan Note (Signed)
Chronic, ongoing, currently followed by psychiatry -- [facility].  Denies SI/HI.  Continue current medication regimen as ordered by psychiatry and attempt to get notes next visit.  Discussed at length with her today.

## 2022-11-24 NOTE — Assessment & Plan Note (Signed)
Thyroidectomy performed on 04/27/22, currently on Levothyroxine.  Continue to collaborate with [facility] team as needed.  Thyroid labs today.

## 2022-11-24 NOTE — Assessment & Plan Note (Signed)
Chronic, stable.  Followed by cardiology, with recent visit.  Continue this collaboration and adjust regimen as needed.

## 2022-11-24 NOTE — Assessment & Plan Note (Addendum)
Chronic, ongoing, followed by neurology at this time as needed.  Recent notes and labs reviewed.  Will continue Gabapentin as ordered by neurology.

## 2022-11-24 NOTE — Progress Notes (Signed)
BP 109/73   Pulse 69   Temp 97.7 F (36.5 C)   Wt (!) 312 lb 9.6 oz (141.8 kg)   LMP 02/09/2017 (Within Weeks)   BMI 46.16 kg/m    Subjective:    Patient ID: Cathy Mitchell, female    DOB: 15-Sep-1965, 57 y.o.   MRN: [id number]  HPI: Cathy Mitchell is a 57 y.o. female  Chief Complaint  Patient presents with   Hypothyroidism    Pt is not fasting   Last saw cardiology on 10/26/22 for PVCs, she continues Cardizem 120 MG TID which offers benefit.    FIBROMYALGIA/TREMOR Follows with [facility] neurology team for tremor and fibromyalgia, functional movement disorder clinic at [facility] seen last 05/25/22, to return as needed. Takes Gabapentin 300 MG TID or leg tremors + Alpha Lipoic Acid.  MRI performed on 12/11/20 noting chronic small vessel disease.  Pain status: stable Satisfied with current treatment?: no Medication side effects: no Medication compliance: good compliance Quality: dull, aching, and burning Current pain level: mild Previous pain level: mild Aggravating factors: lifting and movement Alleviating factors: Gabapentin and Alpha Lipoic Acid Previous pain specialty evaluation: no Non-narcotic analgesic meds: no Narcotic contract:no Treatments attempted: Gabapentin and PT, as above  HYPOTHYROIDISM Diagnosed with thyroid cancer in December 2023.  Thyroid removed on 04/27/22 by Dr. Azucena Fallen at [facility].  Taking Levothyroxine 175 MCG daily, last levels not at goal = she takes at 3 am every morning to avoid it being around other medications.    Continues twice a month B12 injections in office due to low levels + Vitamin D every 3 days.     Follows with [facility] weight management, last visit 10/26/22.  Is on Zepbound 7.5 MG weekly -- she is unable to get her dosage, is not available at present.  Currently taking Topamax, to help with binge eating. Thyroid control status:stable Satisfied with current treatment? yes Medication side effects: no Medication compliance: good compliance Etiology of  hypothyroidism: thyroid removal Recent dose adjustment:no Fatigue: yes Cold intolerance: no Heat intolerance: no Weight gain: yes Weight loss: no Constipation: yes - if eats outside of her regimen Diarrhea/loose stools: yes - eats outside of her regimen Palpitations: no Lower extremity edema: no Anxiety/depressed mood: yes   DEPRESSION/PTSD Continues on Celexa 40 MG daily and Abilify 5 MG daily + Klonopin as needed. Follows with psychiatry, last saw 2 days ago.  Was working with therapist -- Lolita Cram, via Dr. Maryruth Bun office as well -- for PTSD, but they moved.  Follows with new therapist at present.   Duration: stable Anxious mood: no Excessive worrying: yes about her current weight Irritability: no Sweating: no Nausea: no Palpitations:no Hyperventilation: no Panic attacks: no Agoraphobia: no  Obscessions/compulsions: no Depressed mood: occasional    11/24/2022    8:22 AM 07/17/2022   10:11 AM 03/31/2022    8:39 AM 03/13/2022   10:12 AM 02/13/2022    9:42 AM  Depression screen PHQ 2/9  Decreased Interest 0 0 0 0 0  Down, Depressed, Hopeless 0 0 0 1 0  PHQ - 2 Score 0 0 0 1 0  Altered sleeping 2 3 3 3 3   Tired, decreased energy 1 1 3 3 3   Change in appetite 2 3 3  0 2  Feeling bad or failure about yourself  0 0 0 0 0  Trouble concentrating 1 1 1 3 3   Moving slowly or fidgety/restless 0 1 1  3   Suicidal thoughts 0 0 0 0 0  PHQ-9 Score 6 9 11 10 14   Difficult doing work/chores Not difficult at all Somewhat difficult Not difficult at all Somewhat difficult Not difficult at all  Anhedonia: no Weight changes: no Insomnia: no Hypersomnia: no Fatigue/loss of energy: yes Feelings of worthlessness: no Feelings of guilt:  no Impaired concentration/indecisiveness: occasional Suicidal ideations: no  Crying spells: yes Recent Stressors/Life Changes: yes   Relationship problems: no   Family stress: yes  - husband without job   Surveyor, quantity stress: no    Job stress: no    Recent  death/loss: no    2022/12/05    8:23 AM 07/17/2022   10:12 AM 03/31/2022    8:39 AM 03/13/2022   10:14 AM  GAD 7 : Generalized Anxiety Score  Nervous, Anxious, on Edge 0 0 0 1  Control/stop worrying 0 0 0 1  Worry too much - different things 0 0 1 2  Trouble relaxing 1 0 3 1  Restless 1 1 3 3   Easily annoyed or irritable 0 0 0 0  Afraid - awful might happen 0 0 0 0  Total GAD 7 Score 2 1 7 8   Anxiety Difficulty Not difficult at all  Not difficult at all Somewhat difficult   Relevant past medical, surgical, family and social history reviewed and updated as indicated. Interim medical history since our last visit reviewed. Allergies and medications reviewed and updated.  Review of Systems  Constitutional:  Positive for unexpected weight change (gain since thyroid surgery). Negative for activity change, appetite change, diaphoresis and fatigue.  Respiratory: Negative.    Cardiovascular: Negative.   Gastrointestinal: Negative.   Endocrine: Negative for cold intolerance and heat intolerance.  Neurological: Negative.   Psychiatric/Behavioral:  Positive for decreased concentration. Negative for self-injury, sleep disturbance and suicidal ideas. The patient is nervous/anxious.     Per HPI unless specifically indicated above     Objective:    BP 109/73   Pulse 69   Temp 97.7 F (36.5 C)   Wt (!) 312 lb 9.6 oz (141.8 kg)   LMP 02/09/2017 (Within Weeks)   BMI 46.16 kg/m   Wt Readings from Last 3 Encounters:  2022-12-05 (!) 312 lb 9.6 oz (141.8 kg)  10/26/22 (!) 309 lb 3.2 oz (140.3 kg)  09/24/22 (!) 304 lb 9.6 oz (138.2 kg)    Physical Exam Vitals and nursing note reviewed.  Constitutional:      General: She is awake. She is not in acute distress.    Appearance: She is well-developed and well-groomed. She is morbidly obese. She is not ill-appearing.  HENT:     Head: Normocephalic.     Right Ear: Hearing normal.     Left Ear: Hearing normal.  Eyes:     General: Lids are  normal.        Right eye: No discharge.        Left eye: No discharge.     Conjunctiva/sclera: Conjunctivae normal.     Pupils: Pupils are equal, round, and reactive to light.  Neck:     Thyroid: No thyromegaly.     Vascular: No carotid bruit or JVD.  Cardiovascular:     Rate and Rhythm: Normal rate and regular rhythm.     Heart sounds: Normal heart sounds. No murmur heard.    No gallop.  Pulmonary:     Effort: Pulmonary effort is normal. No accessory muscle usage or respiratory distress.     Breath sounds: Normal breath sounds.  Abdominal:  General: Bowel sounds are normal.     Palpations: Abdomen is soft.  Musculoskeletal:     Cervical back: Normal range of motion and neck supple.     Right lower leg: No edema.     Left lower leg: No edema.  Lymphadenopathy:     Cervical: No cervical adenopathy.  Skin:    General: Skin is warm and dry.  Neurological:     Mental Status: She is alert and oriented to person, place, and time.     Motor: Tremor present.     Gait: Gait is intact.     Deep Tendon Reflexes:     Reflex Scores:      Brachioradialis reflexes are 1+ on the right side and 1+ on the left side.      Patellar reflexes are 1+ on the right side and 1+ on the left side.    Comments: Bilateral upper extremity tremor L>R -- varies from resting to active tremor.  No rigidity noted or cogwheel.  Normal gait.    Psychiatric:        Attention and Perception: Attention normal.        Mood and Affect: Mood normal.        Speech: Speech normal.        Behavior: Behavior normal. Behavior is cooperative.        Thought Content: Thought content normal.    Results for orders placed or performed in visit on 10/22/22  T4, free  Result Value Ref Range   Free T4 0.99 0.82 - 1.77 ng/dL  TSH  Result Value Ref Range   TSH 11.300 (H) 0.450 - 4.500 uIU/mL      Assessment & Plan:   Problem List Items Addressed This Visit       Cardiovascular and Mediastinum   PVC's (premature  ventricular contractions)    Chronic, stable.  Followed by cardiology, with recent visit.  Continue this collaboration and adjust regimen as needed.        Endocrine   Hypothyroid    Chronic and ongoing due to thyroid cancer and removal on 04/27/22.  Will continue current Levothyroxine dosing and adjust as needed.  Obtain TSH and Free T4.      Relevant Orders   T4, free   TSH     Nervous and Auditory   Functional movement disorder    Followed by [facility] neurology at this time, continue this collaboration as needed.  Recent notes and MRI reviewed.       Small fiber neuropathy    Diagnosed May 2023.   Continue to collaborate with neurology at [facility] as needed, recent notes and labs reviewed.        Other   Fibromyalgia (Chronic)    Chronic, ongoing, followed by neurology at this time as needed.  Recent notes and labs reviewed.  Will continue Gabapentin as ordered by neurology.      Attention deficit hyperactivity disorder (ADHD), predominantly inattentive type    Previously followed by [facility] with psychiatry -- at this time continue current medication regimen as prescribed by them.        B12 deficiency    Chronic, ongoing.  Continue injections of B12 in office, labs up to date.      Depression - Primary    Chronic, ongoing, currently followed by psychiatry -- [facility].  Denies SI/HI.  Continue current medication regimen as ordered by psychiatry and attempt to get notes next visit.  Discussed at length with  her today.        History of thyroid cancer    Thyroidectomy performed on 04/27/22, currently on Levothyroxine.  Continue to collaborate with [facility] team as needed.  Thyroid labs today.      Other Visit Diagnoses     Need for influenza vaccination       Flu vaccine provided today.        Follow up plan: Return in about 6 months (around 05/27/2023) for Hypothyroid, Mood, Fibromyalgia.

## 2022-11-24 NOTE — Assessment & Plan Note (Signed)
Chronic and ongoing due to thyroid cancer and removal on 04/27/22.  Will continue current Levothyroxine dosing and adjust as needed.  Obtain TSH and Free T4.

## 2022-11-24 NOTE — Assessment & Plan Note (Signed)
Chronic, ongoing.  Continue injections of B12 in office, labs up to date.

## 2022-11-25 LAB — TSH: TSH: 0.42 u[IU]/mL — ABNORMAL LOW (ref 0.450–4.500)

## 2022-11-25 LAB — T4, FREE: Free T4: 1.43 ng/dL (ref 0.82–1.77)

## 2022-11-25 NOTE — Addendum Note (Signed)
Addended by: Aura Dials T on: 11/25/2022 12:03 PM   Modules accepted: Orders

## 2022-11-25 NOTE — Progress Notes (Signed)
Chatted with patient on MyChart.  Needs lab only visit scheduled for 3 weeks please.

## 2022-12-15 ENCOUNTER — Other Ambulatory Visit: Payer: Medicaid Other

## 2022-12-15 ENCOUNTER — Other Ambulatory Visit: Payer: BC Managed Care – PPO

## 2022-12-15 DIAGNOSIS — E890 Postprocedural hypothyroidism: Secondary | ICD-10-CM

## 2023-01-08 ENCOUNTER — Other Ambulatory Visit: Payer: 59

## 2023-01-08 ENCOUNTER — Encounter: Payer: Self-pay | Admitting: Nurse Practitioner

## 2023-01-08 DIAGNOSIS — E890 Postprocedural hypothyroidism: Secondary | ICD-10-CM | POA: Diagnosis not present

## 2023-01-08 DIAGNOSIS — N3281 Overactive bladder: Secondary | ICD-10-CM

## 2023-01-09 LAB — T4, FREE: Free T4: 0.93 ng/dL (ref 0.82–1.77)

## 2023-01-09 LAB — TSH: TSH: 2.7 u[IU]/mL (ref 0.450–4.500)

## 2023-01-10 NOTE — Progress Notes (Signed)
Contacted via MyChart   Good morning Cathy Mitchell, your labs have returned and overall are nice and normal on this check!!  Great news!!  Continue current Levothyroxine dosing as we are in a good place:)

## 2023-01-13 ENCOUNTER — Other Ambulatory Visit: Payer: Self-pay | Admitting: Nurse Practitioner

## 2023-01-13 NOTE — Telephone Encounter (Signed)
Requested Prescriptions  Pending Prescriptions Disp Refills   celecoxib (CELEBREX) 100 MG capsule [Pharmacy Med Name: CELECOXIB 100 MG CAPSULE] 90 capsule 0    Sig: TAKE 1 CAPSULE (100 MG TOTAL) BY MOUTH DAILY AS NEEDED FOR MODERATE PAIN.     Analgesics:  COX2 Inhibitors Failed - 01/13/2023  1:23 AM      Failed - Manual Review: Labs are only required if the patient has taken medication for more than 8 weeks.      Passed - HGB in normal range and within 360 days    Hemoglobin  Date Value Ref Range Status  02/13/2022 13.9 11.1 - 15.9 g/dL Final         Passed - Cr in normal range and within 360 days    Creatinine  Date Value Ref Range Status  06/19/2012 0.77 0.60 - 1.30 mg/dL Final   Creatinine, Ser  Date Value Ref Range Status  02/13/2022 0.69 0.57 - 1.00 mg/dL Final         Passed - HCT in normal range and within 360 days    Hematocrit  Date Value Ref Range Status  02/13/2022 42.6 34.0 - 46.6 % Final         Passed - AST in normal range and within 360 days    AST  Date Value Ref Range Status  02/13/2022 14 0 - 40 IU/L Final   SGOT(AST)  Date Value Ref Range Status  06/19/2012 27 15 - 37 Unit/L Final         Passed - ALT in normal range and within 360 days    ALT  Date Value Ref Range Status  02/13/2022 10 0 - 32 IU/L Final   SGPT (ALT)  Date Value Ref Range Status  06/19/2012 42 12 - 78 U/L Final         Passed - eGFR is 30 or above and within 360 days    EGFR (African American)  Date Value Ref Range Status  06/19/2012 >60  Final   GFR calc Af Amer  Date Value Ref Range Status  03/05/2020 119  Final   EGFR (Non-African Amer.)  Date Value Ref Range Status  06/19/2012 >60  Final    Comment:    eGFR values <87mL/min/1.73 m2 may be an indication of chronic kidney disease (CKD). Calculated eGFR is useful in patients with stable renal function. The eGFR calculation will not be reliable in acutely ill patients when serum creatinine is changing rapidly. It  is not useful in  patients on dialysis. The eGFR calculation may not be applicable to patients at the low and high extremes of body sizes, pregnant women, and vegetarians.    GFR calc non Af Amer  Date Value Ref Range Status  03/05/2020 103  Final   eGFR  Date Value Ref Range Status  02/13/2022 103 >59 mL/min/1.73 Final         Passed - Patient is not pregnant      Passed - Valid encounter within last 12 months    Recent Outpatient Visits           1 month ago Moderate episode of recurrent major depressive disorder (HCC)   [place] [facility] [place], [place] T, NP   3 months ago Recurrent acute serous otitis media of both ears   [place] [facility] [place], Sherran Needs, NP   4 months ago Moderate episode of recurrent major depressive disorder (HCC)   [place] [facility] Cannady, Dorie Rank, NP   5 months ago Postoperative hypothyroidism   [place] [facility] [place], [place] T, NP   6 months ago Acute otitis media, bilateral   [place] [facility] [place], Dorie Rank, NP

## 2023-01-14 DIAGNOSIS — F952 Tourette's disorder: Secondary | ICD-10-CM | POA: Diagnosis not present

## 2023-01-14 DIAGNOSIS — F32A Depression, unspecified: Secondary | ICD-10-CM | POA: Diagnosis not present

## 2023-01-14 DIAGNOSIS — G2581 Restless legs syndrome: Secondary | ICD-10-CM | POA: Diagnosis not present

## 2023-01-14 DIAGNOSIS — F411 Generalized anxiety disorder: Secondary | ICD-10-CM | POA: Diagnosis not present

## 2023-01-14 DIAGNOSIS — R41840 Attention and concentration deficit: Secondary | ICD-10-CM | POA: Diagnosis not present

## 2023-01-14 DIAGNOSIS — M797 Fibromyalgia: Secondary | ICD-10-CM | POA: Diagnosis not present

## 2023-01-15 ENCOUNTER — Other Ambulatory Visit: Payer: Self-pay | Admitting: Nurse Practitioner

## 2023-01-15 NOTE — Telephone Encounter (Signed)
No longer current dosing of this medication Requested Prescriptions  Pending Prescriptions Disp Refills   levothyroxine (SYNTHROID) 137 MCG tablet [Pharmacy Med Name: LEVOTHYROXINE 137 MCG TABLET] 90 tablet 1    Sig: TAKE 1 TABLET BY MOUTH DAILY BEFORE BREAKFAST.     Endocrinology:  Hypothyroid Agents Passed - 01/15/2023  1:33 AM      Passed - TSH in normal range and within 360 days    TSH  Date Value Ref Range Status  01/08/2023 2.700 0.450 - 4.500 uIU/mL Final         Passed - Valid encounter within last 12 months    Recent Outpatient Visits           1 month ago Moderate episode of recurrent major depressive disorder (HCC)   [place] [facility] [place], [place] T, NP   3 months ago Recurrent acute serous otitis media of both ears   [place] [facility] [place], Sherran Needs, NP   4 months ago Moderate episode of recurrent major depressive disorder (HCC)   [place] [facility] [place], Corrie Dandy T, NP   5 months ago Postoperative hypothyroidism   [facility] [place], [place] T, NP   6 months ago Acute otitis media, bilateral   [place] [facility] [place], Dorie Rank, NP

## 2023-01-24 NOTE — Patient Instructions (Signed)

## 2023-01-25 ENCOUNTER — Other Ambulatory Visit: Payer: Self-pay | Admitting: Nurse Practitioner

## 2023-01-26 NOTE — Telephone Encounter (Signed)
Medications D/C. Requested Prescriptions  Refused Prescriptions Disp Refills   levothyroxine (SYNTHROID) 112 MCG tablet [Pharmacy Med Name: LEVOTHYROXINE 112 MCG TABLET] 90 tablet     Sig: TAKE 1 TABLET BY MOUTH EVERY DAY     Endocrinology:  Hypothyroid Agents Passed - 01/25/2023  1:20 AM      Passed - TSH in normal range and within 360 days    TSH  Date Value Ref Range Status  01/08/2023 2.700 0.450 - 4.500 uIU/mL Final         Passed - Valid encounter within last 12 months    Recent Outpatient Visits           2 months ago Moderate episode of recurrent major depressive disorder (HCC)   [place] [facility] [place], [place] T, NP   4 months ago Recurrent acute serous otitis media of both ears   [place] [facility] [place], Sherran Needs, NP   4 months ago Moderate episode of recurrent major depressive disorder (HCC)   [place] [facility] [place], Corrie Dandy T, NP   6 months ago Postoperative hypothyroidism   [place] [facility] [place], [place] T, NP   6 months ago Acute otitis media, bilateral   [place] [facility] [place], Dorie Rank, NP       Future Appointments             In 3 days Marjie Skiff, NP [place] [facility]   In 1 week Richardo Hanks, Laurette Schimke, MD [facility] [place]             levothyroxine (SYNTHROID) 137 MCG tablet [Pharmacy Med Name: LEVOTHYROXINE 137 MCG TABLET] 90 tablet 1    Sig: TAKE 1 TABLET BY MOUTH DAILY BEFORE BREAKFAST.     Endocrinology:  Hypothyroid Agents Passed - 01/25/2023  1:20 AM      Passed - TSH in normal range and within 360 days    TSH  Date Value Ref Range Status  01/08/2023 2.700 0.450 - 4.500 uIU/mL Final         Passed - Valid encounter within last 12 months    Recent Outpatient Visits           2 months ago Moderate episode of recurrent major depressive disorder (HCC)   [place] [facility]  [place], [place] T, NP   4 months ago Recurrent acute serous otitis media of both ears   [place] [facility] [place], Sherran Needs, NP   4 months ago Moderate episode of recurrent major depressive disorder (HCC)   [place] [facility] [place], Corrie Dandy T, NP   6 months ago Postoperative hypothyroidism   [place] [facility] [place], [place] T, NP   6 months ago Acute otitis media, bilateral   [place] [facility] [place], Dorie Rank, NP       Future Appointments             In 3 days Cannady, Dorie Rank, NP [place] [facility]   In 1 week Richardo Hanks, Laurette Schimke, MD [facility] [place]

## 2023-01-29 ENCOUNTER — Encounter: Payer: Self-pay | Admitting: Nurse Practitioner

## 2023-01-29 ENCOUNTER — Ambulatory Visit (INDEPENDENT_AMBULATORY_CARE_PROVIDER_SITE_OTHER): Payer: 59 | Admitting: Nurse Practitioner

## 2023-01-29 VITALS — BP 115/77 | HR 61 | Temp 97.4°F | Ht 68.0 in | Wt 323.4 lb

## 2023-01-29 DIAGNOSIS — M2550 Pain in unspecified joint: Secondary | ICD-10-CM | POA: Diagnosis not present

## 2023-01-29 MED ORDER — PREDNISONE 10 MG PO TABS: ORAL_TABLET | ORAL | 0 refills | Status: DC

## 2023-01-29 MED ORDER — LEVOTHYROXINE SODIUM 175 MCG PO TABS: 175.0000 ug | ORAL_TABLET | ORAL | 4 refills | Status: DC

## 2023-01-29 NOTE — Assessment & Plan Note (Signed)
Ongoing, she is concerned for gout.  Will check labs today to further assess, although she reports some of her family members have it and have normal labs.  Will avoid Colchicine due to her taking Diltiazem.  Trial Prednisone, which she tolerates, in taper to see if benefit to her current pain.  Determine next steps after all labs return.  May need Allopurinol if elevation in uric acid.

## 2023-01-29 NOTE — Progress Notes (Addendum)
BP 115/77 (BP Location: Left Arm, Patient Position: Sitting, Cuff Size: Normal)   Pulse 61   Temp (!) 97.4 F (36.3 C) (Oral)   Ht 5\' 8"  (1.727 m)   Wt (!) 323 lb 6.4 oz (146.7 kg)   LMP 02/09/2017 (Within Weeks)   SpO2 97%   BMI 49.17 kg/m    Subjective:    Patient ID: Cathy Mitchell, female    DOB: 12/29/1965, 57 y.o.   MRN: [id number]  HPI: Cathy Mitchell is a 57 y.o. female  Chief Complaint  Patient presents with   Medical Management of Chronic Issues    Gout in the joints, discuss surgery    GOUT Presents today because she is concerned for gout.  She can not dress herself anymore and feels she is losing independence.  Reports elbows, shoulders, knees, wrists, ankles hurt.  Can not go to gym anymore, gave up membership.  Does not do dishes anymore.  Can do laundry, but it takes her awhile.    Continues on Gabapentin 300 MG TID + Celebrex PRN.  Reports the scale in office is wrong, 100 lbs wrong per her report as her scale at home read 425 lbs.  States gout is in family, but sometimes does not show up in blood work and they had to take medication to find out.  In women it showed up in pill and in men in blood work per her report in family.  Has underlying hypothyroid due to history of cancer and recent labs were normal.  States she is currently taking thyroid medicine every other day because it makes her sick, has been doing it this way for a long while.   Duration:weeks Right 1st metatarsophalangeal pain: yes Left 1st metatarsophalangeal pain: yes Right knee pain: yes Left knee pain: yes Severity: 8/10 to areas Quality: sharp, aching, stabbing, and throbbing Swelling: no Redness: yes Trauma: no Recent dietary change or indiscretion: no Fevers: no Nausea/vomiting: no Aggravating factors: junk food or sugar foods, pickles Alleviating factors: diet changes Status:  fluctuating Treatments attempted: Medications taken at home = Gabapentin and Celebrex (not taking all the  time as does not want kidney disease to return)  Relevant past medical, surgical, family and social history reviewed and updated as indicated. Interim medical history since our last visit reviewed. Allergies and medications reviewed and updated.  Review of Systems  Constitutional:  Negative for activity change, appetite change, diaphoresis, fatigue and fever.  Respiratory:  Negative for cough, chest tightness, shortness of breath and wheezing.   Cardiovascular:  Negative for chest pain, palpitations and leg swelling.  Gastrointestinal: Negative.   Musculoskeletal:  Positive for arthralgias.  Neurological: Negative.   Psychiatric/Behavioral: Negative.      Per HPI unless specifically indicated above     Objective:    BP 115/77 (BP Location: Left Arm, Patient Position: Sitting, Cuff Size: Normal)   Pulse 61   Temp (!) 97.4 F (36.3 C) (Oral)   Ht 5\' 8"  (1.727 m)   Wt (!) 323 lb 6.4 oz (146.7 kg)   LMP 02/09/2017 (Within Weeks)   SpO2 97%   BMI 49.17 kg/m   Wt Readings from Last 3 Encounters:  01/29/23 (!) 323 lb 6.4 oz (146.7 kg)  11/24/22 (!) 312 lb 9.6 oz (141.8 kg)  10/26/22 (!) 309 lb 3.2 oz (140.3 kg)    Physical Exam Vitals and nursing note reviewed.  Constitutional:      General: She is awake. She is not in acute  distress.    Appearance: She is well-developed and well-groomed. She is morbidly obese. She is not ill-appearing.  HENT:     Head: Normocephalic.     Right Ear: Hearing normal.     Left Ear: Hearing normal.  Eyes:     General: Lids are normal.        Right eye: No discharge.        Left eye: No discharge.     Conjunctiva/sclera: Conjunctivae normal.     Pupils: Pupils are equal, round, and reactive to light.  Neck:     Thyroid: No thyromegaly.     Vascular: No carotid bruit or JVD.  Cardiovascular:     Rate and Rhythm: Normal rate and regular rhythm.     Heart sounds: Normal heart sounds. No murmur heard.    No gallop.  Pulmonary:     Effort:  Pulmonary effort is normal. No accessory muscle usage or respiratory distress.     Breath sounds: Normal breath sounds.  Abdominal:     General: Bowel sounds are normal.     Palpations: Abdomen is soft.  Musculoskeletal:     Cervical back: Normal range of motion and neck supple.     Right lower leg: No edema.     Left lower leg: No edema.     Comments: Some mild swelling to bilateral hands noted.  No warmth or redness noted.  Lymphadenopathy:     Cervical: No cervical adenopathy.  Skin:    General: Skin is warm and dry.  Neurological:     Mental Status: She is alert and oriented to person, place, and time.     Motor: Tremor present.     Gait: Gait is intact.     Deep Tendon Reflexes:     Reflex Scores:      Brachioradialis reflexes are 1+ on the right side and 1+ on the left side.      Patellar reflexes are 1+ on the right side and 1+ on the left side.    Comments: Bilateral upper extremity tremor L>R -- varies from resting to active tremor.  No rigidity noted or cogwheel.  Normal gait.    Psychiatric:        Attention and Perception: Attention normal.        Mood and Affect: Mood normal.        Speech: Speech normal.        Behavior: Behavior normal. Behavior is cooperative.        Thought Content: Thought content normal.    Results for orders placed or performed in visit on 01/08/23  TSH  Result Value Ref Range   TSH 2.700 0.450 - 4.500 uIU/mL  T4, free  Result Value Ref Range   Free T4 0.93 0.82 - 1.77 ng/dL      Assessment & Plan:   Problem List Items Addressed This Visit       Other   Multiple joint pain - Primary    Ongoing, she is concerned for gout.  Will check labs today to further assess, although she reports some of her family members have it and have normal labs.  Will avoid Colchicine due to her taking Diltiazem.  Trial Prednisone, which she tolerates, in taper to see if benefit to her current pain.  Determine next steps after all labs return.  May need  Allopurinol if elevation in uric acid.      Relevant Orders   Comprehensive metabolic panel   CBC with  Differential/Platelet   Uric acid     Follow up plan: Return in about 3 months (around 05/01/2023) for WEIGHT CHECK.

## 2023-01-30 LAB — CBC WITH DIFFERENTIAL/PLATELET
Basophils Absolute: 0.1 10*3/uL (ref 0.0–0.2)
Basos: 2 %
EOS (ABSOLUTE): 0.3 10*3/uL (ref 0.0–0.4)
Eos: 5 %
Hematocrit: 37.9 % (ref 34.0–46.6)
Hemoglobin: 11.7 g/dL (ref 11.1–15.9)
Immature Grans (Abs): 0 10*3/uL (ref 0.0–0.1)
Immature Granulocytes: 0 %
Lymphocytes Absolute: 1.8 10*3/uL (ref 0.7–3.1)
Lymphs: 26 %
MCH: 26.4 pg — ABNORMAL LOW (ref 26.6–33.0)
MCHC: 30.9 g/dL — ABNORMAL LOW (ref 31.5–35.7)
MCV: 86 fL (ref 79–97)
Monocytes Absolute: 0.5 10*3/uL (ref 0.1–0.9)
Monocytes: 7 %
Neutrophils Absolute: 4.1 10*3/uL (ref 1.4–7.0)
Neutrophils: 60 %
Platelets: 375 10*3/uL (ref 150–450)
RBC: 4.43 x10E6/uL (ref 3.77–5.28)
RDW: 14.7 % (ref 11.7–15.4)
WBC: 6.9 10*3/uL (ref 3.4–10.8)

## 2023-01-30 LAB — COMPREHENSIVE METABOLIC PANEL
ALT: 18 [IU]/L (ref 0–32)
AST: 20 [IU]/L (ref 0–40)
Albumin: 4.4 g/dL (ref 3.8–4.9)
Alkaline Phosphatase: 140 [IU]/L — ABNORMAL HIGH (ref 44–121)
BUN/Creatinine Ratio: 15 (ref 9–23)
BUN: 13 mg/dL (ref 6–24)
Bilirubin Total: <0.2 mg/dL (ref 0.0–1.2)
CO2: 23 mmol/L (ref 20–29)
Calcium: 9 mg/dL (ref 8.7–10.2)
Chloride: 104 mmol/L (ref 96–106)
Creatinine, Ser: 0.85 mg/dL (ref 0.57–1.00)
Globulin, Total: 2.4 g/dL (ref 1.5–4.5)
Glucose: 84 mg/dL (ref 70–99)
Potassium: 4.1 mmol/L (ref 3.5–5.2)
Sodium: 142 mmol/L (ref 134–144)
Total Protein: 6.8 g/dL (ref 6.0–8.5)
eGFR: 81 mL/min/{1.73_m2} (ref >59)

## 2023-01-30 LAB — URIC ACID: Uric Acid: 4.2 mg/dL (ref 3.0–7.2)

## 2023-01-30 NOTE — Progress Notes (Signed)
Contacted via MyChart   Good morning Cathy Mitchell, your labs have returned and overall are stable with a normal uric acid level.  Kidney function, creatinine and eGFR, remains normal, as is liver function, AST and ALT. Any questions? Keep being stellar!!  Thank you for allowing me to participate in your care.  I appreciate you. Kindest regards, Del Overfelt

## 2023-02-01 ENCOUNTER — Other Ambulatory Visit: Payer: Self-pay | Admitting: *Deleted

## 2023-02-01 DIAGNOSIS — N3281 Overactive bladder: Secondary | ICD-10-CM

## 2023-02-02 ENCOUNTER — Encounter: Payer: Self-pay | Admitting: Urology

## 2023-02-02 ENCOUNTER — Ambulatory Visit (INDEPENDENT_AMBULATORY_CARE_PROVIDER_SITE_OTHER): Payer: 59 | Admitting: Urology

## 2023-02-02 ENCOUNTER — Other Ambulatory Visit
Admission: RE | Admit: 2023-02-02 | Discharge: 2023-02-02 | Disposition: A | Discharge status: Home / Self Care | Payer: 59 | Attending: Urology | Admitting: Urology

## 2023-02-02 VITALS — BP 118/80 | HR 81 | Ht 68.25 in | Wt 326.0 lb

## 2023-02-02 DIAGNOSIS — N3281 Overactive bladder: Secondary | ICD-10-CM | POA: Diagnosis not present

## 2023-02-02 DIAGNOSIS — N39492 Postural (urinary) incontinence: Secondary | ICD-10-CM | POA: Diagnosis not present

## 2023-02-02 LAB — URINALYSIS, COMPLETE (UACMP) WITH MICROSCOPIC
Bilirubin Urine: NEGATIVE
Glucose, UA: NEGATIVE mg/dL
Hgb urine dipstick: NEGATIVE
Ketones, ur: NEGATIVE mg/dL
Leukocytes,Ua: NEGATIVE
Nitrite: NEGATIVE
Protein, ur: NEGATIVE mg/dL
Specific Gravity, Urine: 1.015 (ref 1.005–1.030)
pH: 7 (ref 5.0–8.0)

## 2023-02-02 LAB — BLADDER SCAN AMB NON-IMAGING

## 2023-02-02 NOTE — Progress Notes (Signed)
02/02/23 3:39 PM   Cathy Mitchell 05/27/1965 [id number]  CC: Urinary incontinence, " sling malfunction"  HPI: 57 year old female with a number of comorbidities including morbid obesity with BMI of 49, fibromyalgia, depression, sleep apnea who reports a few months of urinary incontinence in the morning.  She reports a history of a urethral sling placed in 2009 for stress incontinence, this was performed in [place] and those records are not available to me.  She thinks her sling is malfunctioning.  She reports she can feel it pulling in her groin.  She has some frequency during the day but denies significant urgency, primary complaint is leakage when standing out of bed in the morning.  She denies any dysuria or gross hematuria.  Urinalysis today is completely benign, PVR is normal at 0ml.   PMH: Past Medical History:  Diagnosis Date   Anxiety    Arthritis    Back pain    Dementia (HCC)    Depression    Fibromyalgia    Food allergy    [place]   Hypertension    no longer has hypertension   Joint pain    Kidney problem    Neuromuscular disorder (HCC)    L-sided tremors   Osteoarthritis    Other fatigue    Rheumatoid arthritis (HCC)    Sleep apnea    does not use C-PAP    Surgical History: Past Surgical History:  Procedure Laterality Date   CARPAL TUNNEL RELEASE Bilateral 2007   DILATION AND CURETTAGE OF UTERUS     GANGLION CYST EXCISION Bilateral    GANGLION CYST EXCISION Right 10/08/2015   Procedure: REMOVAL GANGLION OF WRIST;  Surgeon: Kennedy Bucker, MD;  Location: [facility] OR[facility];  Service: Orthopedics;  Laterality: Right;   GASTRIC BYPASS     HAND SURGERY Bilateral Resection   HIP SURGERY     HYSTEROSCOPY WITH D & C N/A 05/24/2017   Procedure: DILATATION AND CURETTAGE /HYSTEROSCOPY;  Surgeon: Defrancesco, Prentice Docker, MD;  Location: [facility] OR[facility];  Service: Gynecology;  Laterality: N/A;   INCONTINENCE SURGERY  2009   LEEP N/A 05/24/2017   Procedure: LOOP ELECTROSURGICAL EXCISION  PROCEDURE (LEEP);  Surgeon: Herold Harms, MD;  Location: [facility] OR[facility];  Service: Gynecology;  Laterality: N/A;   TUBAL LIGATION       Family History: Family History  Problem Relation Age of Onset   Hypertension Mother    Depression Mother    Anxiety disorder Mother    Obesity Mother    COPD Father    Hypertension Father    Depression Father    Anxiety disorder Father    Obesity Father    Thyroid disease Daughter    Heart disease Maternal Grandfather    Diabetes Paternal Grandfather    Heart disease Paternal Grandfather    Eczema Son    Breast cancer Neg Hx     Social History:  reports that she quit smoking about 8 years ago. Her smoking use included cigarettes. She started smoking about 37 years ago. She has a 14.5 pack-year smoking history. She has been exposed to tobacco smoke. She has never used smokeless tobacco. She reports that she does not drink alcohol and does not use drugs.  Physical Exam: BP 118/80   Pulse 81   Ht 5' 8.25" (1.734 m)   Wt (!) 326 lb (147.9 kg)   LMP 02/09/2017 (Within Weeks)   BMI 49.21 kg/m    Constitutional:  Alert and oriented, No acute distress. Cardiovascular: No  clubbing, cyanosis, or edema. Respiratory: Normal respiratory effort, no increased work of breathing. GI: Abdomen is soft, nontender, nondistended, no abdominal masses  Assessment & Plan:   57 year old female with a number of comorbidities, history of a urethral sling placed in 2009 in [place], who reports a few months of incontinence when standing out of bed in the morning and worsening urinary frequency of unclear etiology.  She is convinced this is related to malfunction of her prior sling.  Urinalysis is benign and PVR is normal.  She does not have any significant overactive symptoms, and deferred trial of an OAB medication today.  I recommended referral to Dr. Sherron Monday for further evaluation, we will coordinate follow-up.  We discussed evaluation may consist of pelvic exam,  cystoscopy, possible urodynamics  Legrand Rams, MD 02/02/2023  [facility] [address], [address] [place], [place] [address] [phone]

## 2023-02-24 MED ORDER — GABAPENTIN 300 MG PO CAPS: 300.0000 mg | ORAL_CAPSULE | Freq: Three times a day (TID) | ORAL | 4 refills | Status: DC

## 2023-02-24 NOTE — Addendum Note (Signed)
Addended by: Aura Dials T on: 02/24/2023 10:33 AM   Modules accepted: Orders

## 2023-03-16 DIAGNOSIS — R41840 Attention and concentration deficit: Secondary | ICD-10-CM | POA: Diagnosis not present

## 2023-03-16 DIAGNOSIS — F32A Depression, unspecified: Secondary | ICD-10-CM | POA: Diagnosis not present

## 2023-03-16 DIAGNOSIS — F952 Tourette's disorder: Secondary | ICD-10-CM | POA: Diagnosis not present

## 2023-03-16 DIAGNOSIS — M797 Fibromyalgia: Secondary | ICD-10-CM | POA: Diagnosis not present

## 2023-03-16 DIAGNOSIS — F411 Generalized anxiety disorder: Secondary | ICD-10-CM | POA: Diagnosis not present

## 2023-03-16 DIAGNOSIS — G2581 Restless legs syndrome: Secondary | ICD-10-CM | POA: Diagnosis not present

## 2023-04-12 ENCOUNTER — Encounter: Payer: Self-pay | Admitting: Urology

## 2023-04-12 ENCOUNTER — Ambulatory Visit (INDEPENDENT_AMBULATORY_CARE_PROVIDER_SITE_OTHER): Payer: 59 | Admitting: Urology

## 2023-04-12 ENCOUNTER — Ambulatory Visit: Payer: 59 | Admitting: Urology

## 2023-04-12 VITALS — BP 121/83 | HR 79 | Ht 68.0 in

## 2023-04-12 DIAGNOSIS — N3941 Urge incontinence: Secondary | ICD-10-CM

## 2023-04-12 DIAGNOSIS — N3281 Overactive bladder: Secondary | ICD-10-CM | POA: Diagnosis not present

## 2023-04-12 NOTE — Progress Notes (Signed)
 04/12/2023 1:04 PM   Cathy Mitchell 02-04-1966 [id number]  Referring provider: Valerio Melanie DASEN, NP [address] [place],  [place] [address]  No chief complaint on file.   HPI: SN: Patient has medical comorbidities, a sling placed in 2009, pulling in her groin and urinary incontinence.  Residual was 0 mL  I was consulted to assess the patient's urinary incontinence.  Sometimes she has urge incontinence during the day but does not wear a pad.  She has no stress incontinence with coughing sneezing bending or lifting.  She has no bedwetting but can have foot on the floor syndrome but this is not every night.  Because of this she will wear a pad for confidence.  She thinks her sling is no longer working  She time voids every 1 hour to try to stay dry and cannot hold it for 2 hours.  She says she drinks a lot of fluid.  She voids every 2 hours at night and I believe has some ankle edema.  She reports a good flow  She has had a previous sling.  She has not had a hysterectomy.  No history of urinary tract infections kidney stones.  No neurologic issues.  Bowel function normal.  No treatment   PMH: Past Medical History:  Diagnosis Date   Anxiety    Arthritis    Back pain    Dementia (HCC)    Depression    Fibromyalgia    Food allergy    [place]   Hypertension    no longer has hypertension   Joint pain    Kidney problem    Neuromuscular disorder (HCC)    L-sided tremors   Osteoarthritis    Other fatigue    Rheumatoid arthritis (HCC)    Sleep apnea    does not use C-PAP    Surgical History: Past Surgical History:  Procedure Laterality Date   CARPAL TUNNEL RELEASE Bilateral 2007   DILATION AND CURETTAGE OF UTERUS     GANGLION CYST EXCISION Bilateral    GANGLION CYST EXCISION Right 10/08/2015   Procedure: REMOVAL GANGLION OF WRIST;  Surgeon: Ozell Flake, MD;  Location: [facility];  Service: Orthopedics;  Laterality: Right;   GASTRIC BYPASS     HAND SURGERY Bilateral Resection    HIP SURGERY     HYSTEROSCOPY WITH D & C N/A 05/24/2017   Procedure: DILATATION AND CURETTAGE /HYSTEROSCOPY;  Surgeon: Defrancesco, Gladis LABOR, MD;  Location: [facility];  Service: Gynecology;  Laterality: N/A;   INCONTINENCE SURGERY  2009   LEEP N/A 05/24/2017   Procedure: LOOP ELECTROSURGICAL EXCISION PROCEDURE (LEEP);  Surgeon: Kathe Gladis LABOR, MD;  Location: [facility];  Service: Gynecology;  Laterality: N/A;   TUBAL LIGATION      Home Medications:  Allergies as of 04/12/2023       Reactions   Oxycodone-acetaminophen  Itching, Other (See Comments)   Reaction:  Dizziness and blurred vision    Other Other (See Comments)   Patient states she is allergic to all narcotics   Percocet [oxycodone-acetaminophen ] Nausea And Vomiting, Other (See Comments)   Reaction:  Dizziness and blurred vision  Reaction:  Dizziness and blurred vision         Medication List        Accurate as of April 12, 2023  1:04 PM. If you have any questions, ask your nurse or doctor.          acetaminophen  500 MG tablet Commonly known as: TYLENOL  Take 1,500  mg by mouth daily as needed for mild pain.   ARIPiprazole  5 MG tablet Commonly known as: Abilify  Take 1 tablet (5 mg total) by mouth daily.   Calcium High Potency/Vitamin D  600-5 MG-MCG Tabs Generic drug: Calcium Carb-Cholecalciferol  Take by mouth.   celecoxib  100 MG capsule Commonly known as: CELEBREX  TAKE 1 CAPSULE (100 MG TOTAL) BY MOUTH DAILY AS NEEDED FOR MODERATE PAIN.   citalopram  40 MG tablet Commonly known as: CELEXA  Take 1 tablet (40 mg total) by mouth daily.   clonazePAM 0.5 MG tablet Commonly known as: KLONOPIN Take 0.5 mg by mouth 2 (two) times daily as needed for anxiety.   diclofenac  Sodium 1 % Gel Commonly known as: VOLTAREN  Apply 2 g topically 4 (four) times daily.   dicyclomine  20 MG tablet Commonly known as: BENTYL  Take 1 tablet (20 mg total) by mouth 3 (three) times daily. What changed:  when to take  this reasons to take this   diltiazem  120 MG tablet Commonly known as: CARDIZEM  Take 1 tablet (120 mg total) by mouth 3 (three) times daily.   gabapentin  300 MG capsule Commonly known as: NEURONTIN  Take 1 capsule (300 mg total) by mouth 3 (three) times daily.   levothyroxine  175 MCG tablet Commonly known as: SYNTHROID  Take 1 tablet (175 mcg total) by mouth every other day.   MAGNESIUM PO Take by mouth.   meclizine  12.5 MG tablet Commonly known as: ANTIVERT  Take 1 tablet (12.5 mg total) by mouth 3 (three) times daily as needed for dizziness.   mometasone 0.1 % lotion Commonly known as: ELOCON 2 (two) times daily.   predniSONE  10 MG tablet Commonly known as: DELTASONE  Take 6 tablets by mouth daily for 2 days, then reduce by 1 tablet every 2 days until gone   topiramate 100 MG tablet Commonly known as: TOPAMAX Take 200 mg by mouth at bedtime.   Vitamin D  (Ergocalciferol ) 1.25 MG (50000 UNIT) Caps capsule Commonly known as: DRISDOL  TAKE 1 CAPSULE BY MOUTH ONE TIME PER WEEK   Zepbound 2.5 MG/0.5ML Pen Generic drug: tirzepatide SMARTSIG:0.5 Milliliter(s) SUB-Q Once a Week        Allergies:  Allergies  Allergen Reactions   Oxycodone-Acetaminophen  Itching and Other (See Comments)    Reaction:  Dizziness and blurred vision    Other Other (See Comments)    Patient states she is allergic to all narcotics   Percocet [Oxycodone-Acetaminophen ] Nausea And Vomiting and Other (See Comments)    Reaction:  Dizziness and blurred vision  Reaction:  Dizziness and blurred vision     Family History: Family History  Problem Relation Age of Onset   Hypertension Mother    Depression Mother    Anxiety disorder Mother    Obesity Mother    COPD Father    Hypertension Father    Depression Father    Anxiety disorder Father    Obesity Father    Thyroid  disease Daughter    Heart disease Maternal Grandfather    Diabetes Paternal Grandfather    Heart disease Paternal Grandfather     Eczema Son    Breast cancer Neg Hx     Social History:  reports that she quit smoking about 8 years ago. Her smoking use included cigarettes. She started smoking about 37 years ago. She has a 14.5 pack-year smoking history. She has been exposed to tobacco smoke. She has never used smokeless tobacco. She reports that she does not drink alcohol and does not use drugs.  ROS:  Physical Exam: LMP 02/09/2017 (Within Weeks)   Constitutional:  Alert and oriented, No acute distress. HEENT: [place] AT, moist mucus membranes.  Trachea midline, no masses. Cardiovascular: No clubbing, cyanosis, or edema. Respiratory: Normal respiratory effort, no increased work of breathing. GI: Abdomen is soft, nontender, nondistended, no abdominal masses GU: On pelvic examination she had a very well supported bladder neck.  I cannot feel or see any mesh.  No prolapse.  No stress incontinence.  Exam was a bit limited due to her body habitus. Skin: No rashes, bruises or suspicious lesions. Lymph: No cervical or inguinal adenopathy. Neurologic: Grossly intact, no focal deficits, moving all 4 extremities. Psychiatric: Normal mood and affect.  Laboratory Data: Lab Results  Component Value Date   WBC 6.9 01/29/2023   HGB 11.7 01/29/2023   HCT 37.9 01/29/2023   MCV 86 01/29/2023   PLT 375 01/29/2023    Lab Results  Component Value Date   CREATININE 0.85 01/29/2023    No results found for: PSA  No results found for: TESTOSTERONE  Lab Results  Component Value Date   HGBA1C 5.1 08/13/2022    Urinalysis    Component Value Date/Time   COLORURINE YELLOW 02/02/2023 1313   APPEARANCEUR CLEAR 02/02/2023 1313   APPEARANCEUR Cloudy (A) 11/16/2018 1354   LABSPEC 1.015 02/02/2023 1313   PHURINE 7.0 02/02/2023 1313   GLUCOSEU NEGATIVE 02/02/2023 1313   HGBUR NEGATIVE 02/02/2023 1313   BILIRUBINUR NEGATIVE 02/02/2023 1313   BILIRUBINUR Negative  11/16/2018 1354   KETONESUR NEGATIVE 02/02/2023 1313   PROTEINUR NEGATIVE 02/02/2023 1313   NITRITE NEGATIVE 02/02/2023 1313   LEUKOCYTESUR NEGATIVE 02/02/2023 1313    Pertinent Imaging: Reviewed and sent for culture.  Chart reviewed  Assessment & Plan: The patient has mild urge incontinence especially at night likely associated with a nocturnal diuresis.  She is concerned about her sling.  She has frequency but drinks a lot of fluids.  Role of medication and safety cystoscopy discussed.  Role of urodynamics discussed.  Patient understood the role of urodynamics but we will hold off them for now.  She will come back on Gemtesa  samples and prescription for cystoscopy in about 6 weeks  There are no diagnoses linked to this encounter.  No follow-ups on file.  Cathy DELENA Elizabeth, MD  [facility] [address], [address] [place], [place] [address] [phone]

## 2023-04-12 NOTE — Patient Instructions (Signed)

## 2023-04-17 ENCOUNTER — Encounter: Payer: Self-pay | Admitting: Urology

## 2023-04-26 ENCOUNTER — Other Ambulatory Visit: Payer: Self-pay | Admitting: Nurse Practitioner

## 2023-04-26 ENCOUNTER — Encounter: Payer: Self-pay | Admitting: Medical

## 2023-04-26 ENCOUNTER — Encounter: Payer: Self-pay | Admitting: Nurse Practitioner

## 2023-04-26 ENCOUNTER — Ambulatory Visit: Payer: 59 | Attending: Medical | Admitting: Medical

## 2023-04-26 VITALS — BP 115/77 | HR 60 | Ht 68.0 in | Wt 335.2 lb

## 2023-04-26 DIAGNOSIS — R002 Palpitations: Secondary | ICD-10-CM

## 2023-04-26 DIAGNOSIS — G4733 Obstructive sleep apnea (adult) (pediatric): Secondary | ICD-10-CM | POA: Diagnosis not present

## 2023-04-26 DIAGNOSIS — C73 Malignant neoplasm of thyroid gland: Secondary | ICD-10-CM

## 2023-04-26 DIAGNOSIS — I493 Ventricular premature depolarization: Secondary | ICD-10-CM

## 2023-04-26 MED ORDER — DILTIAZEM HCL 120 MG PO TABS: 120.0000 mg | ORAL_TABLET | Freq: Three times a day (TID) | ORAL | 3 refills | Status: DC

## 2023-04-26 NOTE — Patient Instructions (Signed)
Medication Instructions:  Your physician recommends that you continue on your current medications as directed. Please refer to the Current Medication list given to you today.   *If you need a refill on your cardiac medications before your next appointment, please call your pharmacy*   Lab Work: No labs ordered today    Testing/Procedures: No test ordered today    Follow-Up: At [facility], you and your health needs are our priority.  As part of our continuing mission to provide you with exceptional heart care, we have created designated Provider Care Teams.  These Care Teams include your primary Cardiologist (physician) and Advanced Practice Providers (APPs -  Physician Assistants and Nurse Practitioners) who all work together to provide you with the care you need, when you need it.  We recommend signing up for the patient portal called "MyChart".  Sign up information is provided on this After Visit Summary.  MyChart is used to connect with patients for Virtual Visits (Telemedicine).  Patients are able to view lab/test results, encounter notes, upcoming appointments, etc.  Non-urgent messages can be sent to your provider as well.   To learn more about what you can do with MyChart, go to [email or web address].    Your next appointment:   1 year(s)  Provider:   You may see Lorine Bears, MD or one of the following Advanced Practice Providers on your designated Care Team:   Nicolasa Ducking, NP Eula Listen, PA-C Cadence Fransico Michael, PA-C Charlsie Quest, NP Carlos Levering, NP

## 2023-04-26 NOTE — Progress Notes (Unsigned)
Cardiology Office Note:    Date:  04/27/2023   ID:  Cathy Mitchell, DOB 1965/09/15, MRN [id number]  PCP:  Marjie Skiff, NP  [facility] Cardiologist:  Lorine Bears, MD  [facility] Electrophysiologist:  None   Referring MD: Marjie Skiff, NP   Chief Complaint: 6 month follow-up  History of Present Illness:    Cathy Mitchell is a 58 y.o. female with a hx of symptomatic PVCs, OSA, thyroid cancer, gastric bypass surgery who presents for follow-up.    Previous treadmill stress test in 2016 before gastric bypass surgery was normal. Holter monitor in December 2018 showed frequent PVCs with a total of 24,000 beats in 48 hours representing 12% burden.  Echo showed normal LV function with mild MR.  Her PVCs responded to diltiazem.  Most recent outpatient heart monitor 05/2021 showed improvement of PVCs to 2.9%.  Patient was last seen in December 2023 and overall doing much better on diltiazem extended release 3 times a day.  The patient was last seen 09/2022 and stable on dilt 3 times a day.   Today, the patient reports she has been struggling with weight. She had thyroid taken out. Weight continues to increase. BP is normal. She denies palpitations, chest pain, or SOB. No lower leg edema, orthopnea or pnd. She takes diltiazem three times a day. She is not using CPAP.  Past Medical History:  Diagnosis Date   Anxiety    Arthritis    Back pain    Dementia (HCC)    Depression    Fibromyalgia    Food allergy    [place]   Hypertension    no longer has hypertension   Joint pain    Kidney problem    Neuromuscular disorder (HCC)    L-sided tremors   Osteoarthritis    Other fatigue    Rheumatoid arthritis (HCC)    Sleep apnea    does not use C-PAP    Past Surgical History:  Procedure Laterality Date   CARPAL TUNNEL RELEASE Bilateral 2007   DILATION AND CURETTAGE OF UTERUS     GANGLION CYST EXCISION Bilateral    GANGLION CYST EXCISION Right 10/08/2015   Procedure:  REMOVAL GANGLION OF WRIST;  Surgeon: Kennedy Bucker, MD;  Location: [facility];  Service: Orthopedics;  Laterality: Right;   GASTRIC BYPASS     HAND SURGERY Bilateral Resection   HIP SURGERY     HYSTEROSCOPY WITH D & C N/A 05/24/2017   Procedure: DILATATION AND CURETTAGE /HYSTEROSCOPY;  Surgeon: Defrancesco, Prentice Docker, MD;  Location: [facility];  Service: Gynecology;  Laterality: N/A;   INCONTINENCE SURGERY  2009   LEEP N/A 05/24/2017   Procedure: LOOP ELECTROSURGICAL EXCISION PROCEDURE (LEEP);  Surgeon: Herold Harms, MD;  Location: [facility];  Service: Gynecology;  Laterality: N/A;   TUBAL LIGATION      Current Medications: Current Meds  Medication Sig   acetaminophen (TYLENOL) 500 MG tablet Take 1,500 mg by mouth daily as needed for mild pain.   ARIPiprazole (ABILIFY) 5 MG tablet Take 1 tablet (5 mg total) by mouth daily.   Calcium Carb-Cholecalciferol (CALCIUM HIGH POTENCY/VITAMIN D) 600-5 MG-MCG TABS Take by mouth.   celecoxib (CELEBREX) 100 MG capsule TAKE 1 CAPSULE (100 MG TOTAL) BY MOUTH DAILY AS NEEDED FOR MODERATE PAIN.   citalopram (CELEXA) 40 MG tablet Take 1 tablet (40 mg total) by mouth daily.   clonazePAM (KLONOPIN) 0.5 MG tablet Take 0.5 mg by mouth 2 (two) times daily as  needed for anxiety.   diclofenac Sodium (VOLTAREN) 1 % GEL Apply 2 g topically 4 (four) times daily.   dicyclomine (BENTYL) 20 MG tablet Take 1 tablet (20 mg total) by mouth 3 (three) times daily. (Patient taking differently: Take 20 mg by mouth daily as needed.)   gabapentin (NEURONTIN) 300 MG capsule Take 1 capsule (300 mg total) by mouth 3 (three) times daily.   levothyroxine (SYNTHROID) 175 MCG tablet Take 1 tablet (175 mcg total) by mouth every other day.   MAGNESIUM PO Take by mouth.   meclizine (ANTIVERT) 12.5 MG tablet Take 1 tablet (12.5 mg total) by mouth 3 (three) times daily as needed for dizziness.   mometasone (ELOCON) 0.1 % lotion 2 (two) times daily.   predniSONE (DELTASONE) 10 MG tablet  Take 6 tablets by mouth daily for 2 days, then reduce by 1 tablet every 2 days until gone   topiramate (TOPAMAX) 100 MG tablet Take 200 mg by mouth at bedtime.   Vibegron (GEMTESA) 75 MG TABS Take 1 tablet by mouth daily.   Vitamin D, Ergocalciferol, (DRISDOL) 1.25 MG (50000 UNIT) CAPS capsule TAKE 1 CAPSULE BY MOUTH ONE TIME PER WEEK   ZEPBOUND 2.5 MG/0.5ML Pen SMARTSIG:0.5 Milliliter(s) SUB-Q Once a Week   [DISCONTINUED] diltiazem (CARDIZEM) 120 MG tablet Take 1 tablet (120 mg total) by mouth 3 (three) times daily.   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  Tobacco Use   Smoking status: Former    Current packs/day: 0.00    Average packs/day: 0.5 packs/day for 29.0 years (14.5 ttl pk-yrs)    Types: Cigarettes    Start date: 01/28/1986    Quit date: 01/29/2015    Years since quitting: 8.2    Passive exposure: Past   Smokeless tobacco: Never  Vaping Use   Vaping status: Never Used  Substance and Sexual Activity   Alcohol use: No    Alcohol/week: 0.0 standard drinks of alcohol   Drug use: No   Sexual activity: Yes  Other Topics Concern   Not on file  Social History Narrative   Not on file   Social Drivers of Health   Financial Resource Strain: Low Risk  (01/28/2023)   Overall Financial Resource Strain (CARDIA)    Difficulty of Paying Living Expenses: Not hard at all  Food Insecurity: No Food Insecurity (01/28/2023)   Hunger Vital Sign    Worried About Running Out of Food in the Last Year: Never true     Ran Out of Food in the Last Year: Never true  Transportation Needs: No Transportation Needs (01/28/2023)   PRAPARE - Administrator, Civil Service (Medical): No    Lack of Transportation (Non-Medical): No  Physical Activity: Unknown (01/28/2023)   Exercise Vital Sign    Days of Exercise per Week: 0 days    Minutes of Exercise per Session: Not on file  Stress: Stress Concern Present (01/28/2023)   [employer] of Occupational Health - Occupational Stress Questionnaire    Feeling of Stress :  To some extent  Social Connections: Socially Isolated (01/28/2023)   Social Connection and Isolation Panel [NHANES]    Frequency of Communication with Friends and Family: Once a week    Frequency of Social Gatherings with Friends and Family: Never    Attends Religious Services: Never    Diplomatic Services operational officer: No    Attends Engineer, structural: Not on file    Marital Status: Married     Family History: The patient's family history includes Anxiety disorder in her father and mother; COPD in her father; Depression in her father and mother; Diabetes in her paternal grandfather; Eczema in her son; Heart disease in her maternal grandfather and paternal grandfather; Hypertension in her father and mother; Obesity in her father and mother; Thyroid disease in her daughter. There is no history of Breast cancer.  ROS:   Please see the history of present illness.     All other systems reviewed and are negative.  EKGs/Labs/Other Studies Reviewed:    The following studies were reviewed today:  Echo 2018 Study Conclusions   - Left ventricle: The cavity size was normal. Systolic function was    normal. The estimated ejection fraction was in the range of 60%    to 65%. Wall motion was normal; there were no regional wall    motion abnormalities. Doppler parameters are consistent with    abnormal left ventricular relaxation (grade 1 diastolic    dysfunction).  -  Mitral valve: There was mild regurgitation.  - Left atrium: The atrium was mildly dilated.  - Right ventricle: Systolic function was normal.  - Pulmonary arteries: Systolic pressure was within the normal    range.   Impressions:   - Frequent PVCs noted.    Heart monitor 10/2021 Patch Wear Time:  4 days and 13 hours (2023-08-02T05:43:16-0400 to 2023-08-06T19:27:21-0400)   Patient had a min HR of 54 bpm, max HR of 144 bpm, and avg HR of 74 bpm.  Predominant underlying rhythm was Sinus Rhythm.  3 Supraventricular Tachycardia runs occurred, the run with the fastest interval lasting 5 beats with a max rate of 130 bpm, the  longest lasting 8 beats with an avg rate of 102 bpm.  Occasional PVCs with a burden of 2.9%.   Heart monitor 2022 Patch Wear Time:  14 days and 0 hours (2022-03-10T14:42:15-0500 to 2022-03-24T15:42:19-0400)   Patient had a min HR of 55 bpm, max HR of 148 bpm, and avg HR of 84 bpm. Predominant underlying rhythm was Sinus Rhythm.  4 Supraventricular Tachycardia runs occurred, the run with the fastest interval lasting 5 beats with a max rate of 148 bpm, the longest lasting 6 beats with an avg rate of 114 bpm. Frequent PVCs with a burden of 13.6%.   Multiple triggered events by the patient did not correlate with arrhythmia but some did correlate with PVCs.  EKG:  EKG is ordered today.  The ekg ordered today demonstrates NSR 64bpm, LAD, PVC, nonspecific T wave changes  Recent Labs: 01/08/2023: TSH 2.700 01/29/2023: ALT 18; BUN 13; Creatinine, Ser 0.85; Hemoglobin 11.7; Platelets 375; Potassium 4.1; Sodium 142  Recent Lipid Panel    Component Value Date/Time   CHOL 160 12/12/2021 1012   CHOL 145 07/31/2016 1110   TRIG 87 12/12/2021 1012   HDL 60 12/12/2021 1012   HDL 48 07/31/2016 1110   CHOLHDL 2.7 12/12/2021 1012   VLDL 17 12/12/2021 1012   LDLCALC 83 12/12/2021 1012   LDLCALC 73 07/31/2016 1110  Physical Exam:    VS:  BP 115/77 (BP Location: Left Wrist,  Patient Position: Sitting, Cuff Size: Normal)   Pulse 60   Ht 5\' 8"  (1.727 m)   Wt (!) 335 lb 3.2 oz (152 kg)   LMP 02/09/2017 (Within Weeks)   SpO2 96%   BMI 50.97 kg/m     Wt Readings from Last 3 Encounters:  04/26/23 (!) 335 lb 3.2 oz (152 kg)  02/02/23 (!) 326 lb (147.9 kg)  01/29/23 (!) 323 lb 6.4 oz (146.7 kg)     GEN:  Well nourished, well developed in no acute distress HEENT: Normal NECK: No JVD; No carotid bruits LYMPHATICS: No lymphadenopathy CARDIAC: RRR, no murmurs, rubs, gallops RESPIRATORY:  Clear to auscultation without rales, wheezing or rhonchi  ABDOMEN: Soft, non-tender, non-distended MUSCULOSKELETAL:  No edema; No deformity  SKIN: Warm and dry NEUROLOGIC:  Alert and oriented x 3 PSYCHIATRIC:  Normal affect   ASSESSMENT:    1. PVC's (premature ventricular contractions)   2. Palpitations   3. Thyroid cancer (HCC)   4. OSA (obstructive sleep apnea)    PLAN:    In order of problems listed above:  PVCs Symptoms controlled on Cardizem. I will refill in Cardizem 120mg  TID.  Thyroid cancer She is s/p thyroidectomy and reports weight gain over the last year despite following a strict diet.   OSA She is not using a CPAP, says she does not need it.   Disposition: Follow up in 1 year(s) with MD/APP    Signed, Draycen Leichter David Stall, PA-C  04/27/2023 8:16 AM     [facility]

## 2023-04-27 ENCOUNTER — Encounter: Payer: Self-pay | Admitting: Nurse Practitioner

## 2023-04-27 MED ORDER — LEVOTHYROXINE SODIUM 175 MCG PO TABS: 175.0000 ug | ORAL_TABLET | ORAL | 4 refills | Status: DC

## 2023-04-27 NOTE — Telephone Encounter (Signed)
Requested Prescriptions  Refused Prescriptions Disp Refills   levothyroxine (SYNTHROID) 150 MCG tablet [Pharmacy Med Name: LEVOTHYROXINE 150 MCG TABLET] 90 tablet 1    Sig: TAKE 1 TABLET BY MOUTH EVERY DAY     Endocrinology:  Hypothyroid Agents Passed - 04/27/2023  4:50 PM      Passed - TSH in normal range and within 360 days    TSH  Date Value Ref Range Status  01/08/2023 2.700 0.450 - 4.500 uIU/mL Final         Passed - Valid encounter within last 12 months    Recent Outpatient Visits           2 months ago Multiple joint pain   [facility] [place], [place] T, NP   5 months ago Moderate episode of recurrent major depressive disorder (HCC)   [place] [facility] [place], [place] T, NP   7 months ago Recurrent acute serous otitis media of both ears   [facility] [place], Sherran Needs, NP   7 months ago Moderate episode of recurrent major depressive disorder (HCC)   [place] [facility] [place], Corrie Dandy T, NP   9 months ago Postoperative hypothyroidism   [place] [facility] [place], Dorie Rank, NP       Future Appointments             In 6 days Cann[place], Dorie Rank, NP [place] [facility]             levothyroxine (SYNTHROID) 112 MCG tablet [Pharmacy Med Name: LEVOTHYROXINE 112 MCG TABLET] 90 tablet     Sig: TAKE 1 TABLET BY MOUTH EVERY DAY     Endocrinology:  Hypothyroid Agents Passed - 04/27/2023  4:50 PM      Passed - TSH in normal range and within 360 days    TSH  Date Value Ref Range Status  01/08/2023 2.700 0.450 - 4.500 uIU/mL Final         Passed - Valid encounter within last 12 months    Recent Outpatient Visits           2 months ago Multiple joint pain   [facility] [place], [place] T, NP   5 months ago Moderate episode of recurrent major depressive disorder (HCC)   [place] [facility] [place],  [place] T, NP   7 months ago Recurrent acute serous otitis media of both ears   [place] [facility] [place], Sherran Needs, NP   7 months ago Moderate episode of recurrent major depressive disorder (HCC)   [place] [facility] [place], Corrie Dandy T, NP   9 months ago Postoperative hypothyroidism   [place] [facility] [place], Dorie Rank, NP       Future Appointments             In 6 days Cannady, Dorie Rank, NP [place] [facility]             levothyroxine (SYNTHROID) 137 MCG tablet [Pharmacy Med Name: LEVOTHYROXINE 137 MCG TABLET] 90 tablet 1    Sig: TAKE 1 TABLET BY MOUTH DAILY BEFORE BREAKFAST.     Endocrinology:  Hypothyroid Agents Passed - 04/27/2023  4:50 PM      Passed - TSH in normal range and within 360 days    TSH  Date Value Ref Range Status  01/08/2023 2.700 0.450 - 4.500 uIU/mL Final  Passed - Valid encounter within last 12 months    Recent Outpatient Visits           2 months ago Multiple joint pain   [place] [facility] [place], [place] T, NP   5 months ago Moderate episode of recurrent major depressive disorder (HCC)   [place] [facility] [place], Velma T, NP   7 months ago Recurrent acute serous otitis media of both ears   [facility] [place], Sherran Needs, NP   7 months ago Moderate episode of recurrent major depressive disorder [facility])   [place] [facility] [place], Corrie Dandy T, NP   9 months ago Postoperative hypothyroidism   [place] [facility] [place], Dorie Rank, NP       Future Appointments             In 6 days Marjie Skiff, NP [place] [facility]

## 2023-05-02 NOTE — Patient Instructions (Signed)
 Managing Depression, Adult Depression is a mental health condition that affects your thoughts, feelings, and actions. Being diagnosed with depression can bring you relief if you did not know why you have felt or behaved a certain way. It could also leave you feeling overwhelmed. Finding ways to manage your symptoms can help you feel more positive about your future. How to manage lifestyle changes Being depressed is difficult. Depression can increase the level of everyday stress. Stress can make depression symptoms worse. You may believe your symptoms cannot be managed or will never improve. However, there are many things you can try to help manage your symptoms. There is hope. Managing stress  Stress is your body's reaction to life changes and events, both good and bad. Stress can add to your feelings of depression. Learning to manage your stress can help lessen your feelings of depression. Try some of the following approaches to reducing your stress (stress reduction techniques): Listen to music that you enjoy and that inspires you. Try using a meditation app or take a meditation class. Develop a practice that helps you connect with your spiritual self. Walk in nature, pray, or go to a place of worship. Practice deep breathing. To do this, inhale slowly through your nose. Pause at the top of your inhale for a few seconds and then exhale slowly, letting yourself relax. Repeat this three or four times. Practice yoga to help relax and work your muscles. Choose a stress reduction technique that works for you. These techniques take time and practice to develop. Set aside 5-15 minutes a day to do them. Therapists can offer training in these techniques. Do these things to help manage stress: Keep a journal. Know your limits. Set healthy boundaries for yourself and others, such as saying "no" when you think something is too much. Pay attention to how you react to certain situations. You may not be able to  control everything, but you can change your reaction. Add humor to your life by watching funny movies or shows. Make time for activities that you enjoy and that relax you. Spend less time using electronics, especially at night before bed. The light from screens can make your brain think it is time to get up rather than go to bed.  Medicines Medicines, such as antidepressants, are often a part of treatment for depression. Talk with your pharmacist or health care provider about all the medicines, supplements, and herbal products that you take, their possible side effects, and what medicines and other products are safe to take together. Make sure to report any side effects you may have to your health care provider. Relationships Your health care provider may suggest family therapy, couples therapy, or individual therapy as part of your treatment. How to recognize changes Everyone responds differently to treatment for depression. As you recover from depression, you may start to: Have more interest in doing activities. Feel more hopeful. Have more energy. Eat a more regular amount of food. Have better mental focus. It is important to recognize if your depression is not getting better or is getting worse. The symptoms you had in the beginning may return, such as: Feeling tired. Eating too much or too little. Sleeping too much or too little. Feeling restless, agitated, or hopeless. Trouble focusing or making decisions. Having unexplained aches and pains. Feeling irritable, angry, or aggressive. If you or your family members notice these symptoms coming back, let your health care provider know right away. Follow these instructions at home: Activity Try to  get some form of exercise each day, such as walking. Try yoga, mindfulness, or other stress reduction techniques. Participate in group activities if you are able. Lifestyle Get enough sleep. Cut down on or stop using caffeine, tobacco,  alcohol, and any other harmful substances. Eat a healthy diet that includes plenty of vegetables, fruits, whole grains, low-fat dairy products, and lean protein. Limit foods that are high in solid fats, added sugar, or salt (sodium). General instructions Take over-the-counter and prescription medicines only as told by your health care provider. Keep all follow-up visits. It is important for your health care provider to check on your mood, behavior, and medicines. Your health care provider may need to make changes to your treatment. Where to find support Talking to others  Friends and family members can be sources of support and guidance. Talk to trusted friends or family members about your condition. Explain your symptoms and let them know that you are working with a health care provider to treat your depression. Tell friends and family how they can help. Finances Find mental health providers that fit with your financial situation. Talk with your health care provider if you are worried about access to food, housing, or medicine. Call your insurance company to learn about your co-pays and prescription plan. Where to find more information You can find support in your area from: Anxiety and Depression Association of America (ADAA): adaa.org Mental Health America: mentalhealthamerica.net The [employer] on Mental Illness: nami.org Contact a health care provider if: You stop taking your antidepressant medicines, and you have any of these symptoms: Nausea. Headache. Light-headedness. Chills and body aches. Not being able to sleep (insomnia). You or your friends and family think your depression is getting worse. Get help right away if: You have thoughts of hurting yourself or others. Get help right away if you feel like you may hurt yourself or others, or have thoughts about taking your own life. Go to your nearest emergency room or: Call 911. Call the National Suicide Prevention Lifeline at  782-585-6076 or 988. This is open 24 hours a day. Text the Crisis Text Line at 202-756-2551. This information is not intended to replace advice given to you by your health care provider. Make sure you discuss any questions you have with your health care provider. Document Revised: 07/22/2021 Document Reviewed: 07/22/2021 Elsevier Patient Education  2024 [employer].

## 2023-05-03 ENCOUNTER — Ambulatory Visit: Payer: 59 | Admitting: Nurse Practitioner

## 2023-05-03 ENCOUNTER — Encounter: Payer: Self-pay | Admitting: Nurse Practitioner

## 2023-05-03 VITALS — BP 130/74 | HR 74 | Temp 97.6°F | Ht 68.0 in | Wt 333.4 lb

## 2023-05-03 DIAGNOSIS — F331 Major depressive disorder, recurrent, moderate: Secondary | ICD-10-CM | POA: Diagnosis not present

## 2023-05-03 DIAGNOSIS — E538 Deficiency of other specified B group vitamins: Secondary | ICD-10-CM

## 2023-05-03 DIAGNOSIS — E559 Vitamin D deficiency, unspecified: Secondary | ICD-10-CM | POA: Diagnosis not present

## 2023-05-03 DIAGNOSIS — Z6841 Body Mass Index (BMI) 40.0 and over, adult: Secondary | ICD-10-CM | POA: Diagnosis not present

## 2023-05-03 DIAGNOSIS — I493 Ventricular premature depolarization: Secondary | ICD-10-CM | POA: Diagnosis not present

## 2023-05-03 DIAGNOSIS — E66813 Obesity, class 3: Secondary | ICD-10-CM

## 2023-05-03 DIAGNOSIS — E890 Postprocedural hypothyroidism: Secondary | ICD-10-CM | POA: Diagnosis not present

## 2023-05-03 DIAGNOSIS — M797 Fibromyalgia: Secondary | ICD-10-CM

## 2023-05-03 DIAGNOSIS — G629 Polyneuropathy, unspecified: Secondary | ICD-10-CM

## 2023-05-03 DIAGNOSIS — F418 Other specified anxiety disorders: Secondary | ICD-10-CM | POA: Diagnosis not present

## 2023-05-03 DIAGNOSIS — G4733 Obstructive sleep apnea (adult) (pediatric): Secondary | ICD-10-CM

## 2023-05-03 DIAGNOSIS — F952 Tourette's disorder: Secondary | ICD-10-CM | POA: Diagnosis not present

## 2023-05-03 LAB — BAYER DCA HB A1C WAIVED: HB A1C (BAYER DCA - WAIVED): 5.3 % (ref 4.8–5.6)

## 2023-05-03 MED ORDER — LEVOTHYROXINE SODIUM 175 MCG PO TABS: 175.0000 ug | ORAL_TABLET | Freq: Every day | ORAL | 12 refills | Status: DC

## 2023-05-03 MED ORDER — METFORMIN HCL 500 MG PO TABS: 500.0000 mg | ORAL_TABLET | Freq: Two times a day (BID) | ORAL | 3 refills | Status: AC

## 2023-05-03 NOTE — Assessment & Plan Note (Signed)
Chronic, ongoing, continue supplement. Obtain labs today to recheck level.

## 2023-05-03 NOTE — Assessment & Plan Note (Signed)
Chronic, ongoing, currently followed by psychiatry -- [facility].  Denies SI/HI.  Continue current medication regimen as ordered by psychiatry and attempt to get notes next visit.  Discussed at length with her today.

## 2023-05-03 NOTE — Assessment & Plan Note (Addendum)
Chronic and ongoing due to thyroid cancer and removal on 04/27/22.  Will continue current Levothyroxine dosing and adjust as needed.  Obtain TSH and Free T4.  Just started 175 MCG dosing due to pharmacy having not updated dose in chart.

## 2023-05-03 NOTE — Assessment & Plan Note (Signed)
Diagnosed in May 2023, will continue collaboration with [facility] as needed and psychiatry.  Recent notes and labs reviewed.

## 2023-05-03 NOTE — Assessment & Plan Note (Signed)
Diagnosed May 2023.   Continue to collaborate with neurology at [facility] as needed, recent notes and labs reviewed.

## 2023-05-03 NOTE — Assessment & Plan Note (Signed)
Chronic, ongoing.  Continue injections of B12 in office, labs today. 

## 2023-05-03 NOTE — Assessment & Plan Note (Addendum)
BMI 50.69, no longer following with weight management and can not get injectables due to cost.  In past she lost significant weight with Wegovy, which offered benefit to her chronic pain.  Continue Topamax and we will trial Metformin 500 MG BID to see if tolerates and if some weight loss presents. Educated her on this.  Recommended eating smaller high protein, low fat meals more frequently and exercising 30 mins a day 5 times a week with a goal of 10-15lb weight loss in the next 3 months. Patient voiced their understanding and motivation to adhere to these recommendations.

## 2023-05-03 NOTE — Assessment & Plan Note (Signed)
 Refer to depression plan of care.

## 2023-05-03 NOTE — Assessment & Plan Note (Signed)
Chronic, ongoing, followed by neurology at this time as needed.  Recent notes and labs reviewed.  Will continue Gabapentin as ordered by neurology.

## 2023-05-03 NOTE — Assessment & Plan Note (Signed)
Chronic, stable.  Followed by cardiology, with recent visit.  Continue this collaboration and adjust regimen as needed.

## 2023-05-03 NOTE — Progress Notes (Signed)
BP 130/74   Pulse 74   Temp 97.6 F (36.4 C) (Oral)   Ht 5\' 8"  (1.727 m)   Wt (!) 333 lb 6.4 oz (151.2 kg)   LMP 02/09/2017 (Within Weeks)   SpO2 98%   BMI 50.69 kg/m    Subjective:    Patient ID: Cathy Mitchell, female    DOB: February 16, 1966, 58 y.o.   MRN: [id number]  HPI: Cathy Mitchell is a 58 y.o. female  Chief Complaint  Patient presents with   Obesity   Depression   [facility] Sees [facility]y team for tremor and fibromyalgia, functional movement disorder clinic. Last seen 05/25/22, to return as needed. Continues Gabapentin 300 MG TID for leg tremors.  MRI performed on 12/11/20 noting chronic small vessel disease. Continues B12 and Vitamin D. Pain status: ongoing and some exacerbation with weight gain recently Satisfied with current treatment?: no Medication side effects: no Medication compliance: good compliance Quality: dull, aching, and burning Current pain level: mild Previous pain level: mild Aggravating factors: lifting and movement Alleviating factors: Gabapentin and Alpha Lipoic Acid Previous pain specialty evaluation: no Non-narcotic analgesic meds: no Narcotic contract:no Treatments attempted: Gabapentin and PT, as above  HYPOTHYROIDISM Diagnosed with thyroid cancer in December 2023.  Thyroid removed on 04/27/22 by Dr. Azucena Fallen at [facility].  Taking Levothyroxine 175 MCG daily. Continues twice a month B12 injections in office due to low levels + Vitamin D every 3 days.  Last saw cardiology on 04/26/23 for PVCs, she continues Cardizem 120 MG TID which offers benefit.  She is to return once a year.  Reports she will get bloated if does not eat what she should.   History: Did follow with [facility] weight management, last visit 10/26/22.  Took Zepbound 7.5 MG weekly. Thyroid control status:stable Satisfied with current treatment? yes Medication side effects: no Medication compliance: good compliance Etiology of hypothyroidism: surgical removal Recent dose  adjustment:no Fatigue: yes Cold intolerance: no Heat intolerance: no Weight gain: yes -- is frustrated with ongoing gain since surgery and trying to find diet that works for her now that thyroid is removed. Weight loss: no Constipation: yes - depends on what she eats Diarrhea/loose stools: yes - depends on what she eats Palpitations: no Lower extremity edema: no Anxiety/depressed mood: yes   DEPRESSION/PTSD Continues on Celexa 40 MG daily and Abilify 5 MG daily + Klonopin as needed. Follows with psychiatry, last saw 2 months ago. Follows with new therapist at present.   Duration: stable Anxious mood: yes Excessive worrying: yes about her current weight Irritability: no Sweating: no Nausea: no Palpitations:no Hyperventilation: no Panic attacks: no Agoraphobia: no  Obscessions/compulsions: no Depressed mood: yes    05/03/2023   10:36 AM 01/29/2023   11:11 AM 11/24/2022    8:22 AM 07/17/2022   10:11 AM 03/31/2022    8:39 AM  Depression screen PHQ 2/9  Decreased Interest 0 0 0 0 0  Down, Depressed, Hopeless 2 0 0 0 0  PHQ - 2 Score 2 0 0 0 0  Altered sleeping 2 3 2 3 3   Tired, decreased energy 1 0 1 1 3   Change in appetite 1 0 2 3 3   Feeling bad or failure about yourself  3 0 0 0 0  Trouble concentrating 3 0 1 1 1   Moving slowly or fidgety/restless 0 3 0 1 1  Suicidal thoughts 0 0 0 0 0  PHQ-9 Score 12 6 6 9 11   Difficult doing work/chores Somewhat difficult  Not  difficult at all Somewhat difficult Not difficult at all  Anhedonia: no Weight changes: no Insomnia: no Hypersomnia: no Fatigue/loss of energy: yes Feelings of worthlessness: no Feelings of guilt:  no Impaired concentration/indecisiveness: occasional Suicidal ideations: no  Crying spells: yes Recent Stressors/Life Changes: yes   Relationship problems: no   Family stress: no   Financial stress: no    Job stress: no    Recent death/loss: no    2023-05-25   10:37 AM 01/29/2023   11:10 AM 11/24/2022    8:23 AM  07/17/2022   10:12 AM  GAD 7 : Generalized Anxiety Score  Nervous, Anxious, on Edge 2 0 0 0  Control/stop worrying 1 0 0 0  Worry too much - different things 1 0 0 0  Trouble relaxing 1 0 1 0  Restless 3 0 1 1  Easily annoyed or irritable 0 0 0 0  Afraid - awful might happen 0 0 0 0  Total GAD 7 Score 8 0 2 1  Anxiety Difficulty Somewhat difficult  Not difficult at all    Relevant past medical, surgical, family and social history reviewed and updated as indicated. Interim medical history since our last visit reviewed. Allergies and medications reviewed and updated.  Review of Systems  Constitutional:  Positive for unexpected weight change (gain since thyroid surgery). Negative for activity change, appetite change, diaphoresis and fatigue.  Respiratory: Negative.    Cardiovascular: Negative.   Gastrointestinal: Negative.   Endocrine: Negative for cold intolerance and heat intolerance.  Neurological: Negative.   Psychiatric/Behavioral:  Positive for decreased concentration. Negative for self-injury, sleep disturbance and suicidal ideas. The patient is nervous/anxious.    Per HPI unless specifically indicated above     Objective:    BP 130/74   Pulse 74   Temp 97.6 F (36.4 C) (Oral)   Ht 5\' 8"  (1.727 m)   Wt (!) 333 lb 6.4 oz (151.2 kg)   LMP 02/09/2017 (Within Weeks)   SpO2 98%   BMI 50.69 kg/m   Wt Readings from Last 3 Encounters:  05/25/2023 (!) 333 lb 6.4 oz (151.2 kg)  04/26/23 (!) 335 lb 3.2 oz (152 kg)  02/02/23 (!) 326 lb (147.9 kg)    Physical Exam Vitals and nursing note reviewed.  Constitutional:      General: She is awake. She is not in acute distress.    Appearance: She is well-developed and well-groomed. She is morbidly obese. She is not ill-appearing.  HENT:     Head: Normocephalic.     Right Ear: Hearing normal.     Left Ear: Hearing normal.  Eyes:     General: Lids are normal.        Right eye: No discharge.        Left eye: No discharge.      Conjunctiva/sclera: Conjunctivae normal.     Pupils: Pupils are equal, round, and reactive to light.  Neck:     Thyroid: No thyromegaly.     Vascular: No carotid bruit or JVD.  Cardiovascular:     Rate and Rhythm: Normal rate and regular rhythm.     Heart sounds: Normal heart sounds. No murmur heard.    No gallop.  Pulmonary:     Effort: Pulmonary effort is normal. No accessory muscle usage or respiratory distress.     Breath sounds: Normal breath sounds.  Abdominal:     General: Bowel sounds are normal.     Palpations: Abdomen is soft.  Musculoskeletal:  Cervical back: Normal range of motion and neck supple.     Right lower leg: No edema.     Left lower leg: No edema.  Lymphadenopathy:     Cervical: No cervical adenopathy.  Skin:    General: Skin is warm and dry.  Neurological:     Mental Status: She is alert and oriented to person, place, and time.     Motor: Tremor present.     Gait: Gait is intact.     Deep Tendon Reflexes:     Reflex Scores:      Brachioradialis reflexes are 1+ on the right side and 1+ on the left side.      Patellar reflexes are 1+ on the right side and 1+ on the left side.    Comments: Bilateral upper extremity tremor L>R -- varies from resting to active tremor.  No rigidity noted or cogwheel.  Normal gait.    Psychiatric:        Attention and Perception: Attention normal.        Mood and Affect: Mood normal.        Speech: Speech normal.        Behavior: Behavior normal. Behavior is cooperative.        Thought Content: Thought content normal.    Results for orders placed or performed during the hospital encounter of 02/02/23  Urinalysis, Complete w Microscopic -   Collection Time: 02/02/23  1:13 PM  Result Value Ref Range   Color, Urine YELLOW YELLOW   APPearance CLEAR CLEAR   Specific Gravity, Urine 1.015 1.005 - 1.030   pH 7.0 5.0 - 8.0   Glucose, UA NEGATIVE NEGATIVE mg/dL   Hgb urine dipstick NEGATIVE NEGATIVE   Bilirubin Urine  NEGATIVE NEGATIVE   Ketones, ur NEGATIVE NEGATIVE mg/dL   Protein, ur NEGATIVE NEGATIVE mg/dL   Nitrite NEGATIVE NEGATIVE   Leukocytes,Ua NEGATIVE NEGATIVE   Squamous Epithelial / HPF 0-5 0 - 5 /HPF   WBC, UA 0-5 0 - 5 WBC/hpf   RBC / HPF 0-5 0 - 5 RBC/hpf   Bacteria, UA RARE (A) NONE SEEN      Assessment & Plan:   Problem List Items Addressed This Visit       Cardiovascular and Mediastinum   PVC's (premature ventricular contractions)   Chronic, stable.  Followed by cardiology, with recent visit.  Continue this collaboration and adjust regimen as needed.      Relevant Orders   Lipid Panel w/o Chol/HDL Ratio     Endocrine   Hypothyroid   Chronic and ongoing due to thyroid cancer and removal on 04/27/22.  Will continue current Levothyroxine dosing and adjust as needed.  Obtain TSH and Free T4.  Just started 175 MCG dosing due to pharmacy having not updated dose in chart.      Relevant Medications   levothyroxine (SYNTHROID) 175 MCG tablet   Other Relevant Orders   TSH   T4, free     Nervous and Auditory   Small fiber neuropathy   Diagnosed May 2023.   Continue to collaborate with neurology at [facility] as needed, recent notes and labs reviewed.      Relevant Medications   topiramate (TOPAMAX) 200 MG tablet   Tourette syndrome   Diagnosed in May 2023, will continue collaboration with [facility] as needed and psychiatry.  Recent notes and labs reviewed.        Other   Fibromyalgia (Chronic)   Chronic, ongoing, followed by neurology at  this time as needed.  Recent notes and labs reviewed.  Will continue Gabapentin as ordered by neurology.      Relevant Medications   topiramate (TOPAMAX) 200 MG tablet   Other Relevant Orders   Comprehensive metabolic panel   CBC with Differential/Platelet   B12 deficiency   Chronic, ongoing.  Continue injections of B12 in office, labs today.      Relevant Orders   Vitamin B12   Depression - Primary   Chronic, ongoing, currently  followed by psychiatry -- [facility].  Denies SI/HI.  Continue current medication regimen as ordered by psychiatry and attempt to get notes next visit.  Discussed at length with her today.        Obesity   BMI 50.69, no longer following with weight management and can not get injectables due to cost.  In past she lost significant weight with Wegovy, which offered benefit to her chronic pain.  Continue Topamax and we will trial Metformin 500 MG BID to see if tolerates and if some weight loss presents. Educated her on this.  Recommended eating smaller high protein, low fat meals more frequently and exercising 30 mins a day 5 times a week with a goal of 10-15lb weight loss in the next 3 months. Patient voiced their understanding and motivation to adhere to these recommendations.       Relevant Medications   metFORMIN (GLUCOPHAGE) 500 MG tablet   Other Relevant Orders   Bayer DCA Hb A1c Waived   Situational anxiety   Refer to depression plan of care.      Vitamin D deficiency   Chronic, ongoing, continue supplement. Obtain labs today to recheck level.       Relevant Orders   VITAMIN D 25 Hydroxy (Vit-D Deficiency, Fractures)     Follow up plan: Return in about 3 months (around 07/31/2023) for Hypothyroid & WEIGHT CHECK.

## 2023-05-04 ENCOUNTER — Encounter: Payer: Self-pay | Admitting: Nurse Practitioner

## 2023-05-04 LAB — COMPREHENSIVE METABOLIC PANEL WITH GFR
ALT: 18 [IU]/L (ref 0–32)
AST: 14 [IU]/L (ref 0–40)
Albumin: 4.3 g/dL (ref 3.8–4.9)
Alkaline Phosphatase: 122 [IU]/L — ABNORMAL HIGH (ref 44–121)
BUN/Creatinine Ratio: 22 (ref 9–23)
BUN: 18 mg/dL (ref 6–24)
Bilirubin Total: <0.2 mg/dL (ref 0.0–1.2)
CO2: 22 mmol/L (ref 20–29)
Calcium: 9.1 mg/dL (ref 8.7–10.2)
Chloride: 103 mmol/L (ref 96–106)
Creatinine, Ser: 0.82 mg/dL (ref 0.57–1.00)
Globulin, Total: 2.7 g/dL (ref 1.5–4.5)
Glucose: 90 mg/dL (ref 70–99)
Potassium: 4 mmol/L (ref 3.5–5.2)
Sodium: 141 mmol/L (ref 134–144)
Total Protein: 7 g/dL (ref 6.0–8.5)
eGFR: 84 mL/min/{1.73_m2}

## 2023-05-04 LAB — VITAMIN B12: Vitamin B-12: 507 pg/mL (ref 232–1245)

## 2023-05-04 LAB — LIPID PANEL W/O CHOL/HDL RATIO
Cholesterol, Total: 157 mg/dL (ref 100–199)
HDL: 66 mg/dL (ref >39)
LDL Chol Calc (NIH): 77 mg/dL (ref 0–99)
Triglycerides: 74 mg/dL (ref 0–149)
VLDL Cholesterol Cal: 14 mg/dL (ref 5–40)

## 2023-05-04 LAB — CBC WITH DIFFERENTIAL/PLATELET
Basophils Absolute: 0.2 10*3/uL (ref 0.0–0.2)
Basos: 2 %
EOS (ABSOLUTE): 0.4 10*3/uL (ref 0.0–0.4)
Eos: 6 %
Hematocrit: 37.7 % (ref 34.0–46.6)
Hemoglobin: 12.1 g/dL (ref 11.1–15.9)
Immature Grans (Abs): 0 10*3/uL (ref 0.0–0.1)
Immature Granulocytes: 0 %
Lymphocytes Absolute: 1.9 10*3/uL (ref 0.7–3.1)
Lymphs: 24 %
MCH: 27.3 pg (ref 26.6–33.0)
MCHC: 32.1 g/dL (ref 31.5–35.7)
MCV: 85 fL (ref 79–97)
Monocytes Absolute: 0.5 10*3/uL (ref 0.1–0.9)
Monocytes: 6 %
Neutrophils Absolute: 4.7 10*3/uL (ref 1.4–7.0)
Neutrophils: 62 %
Platelets: 398 10*3/uL (ref 150–450)
RBC: 4.44 x10E6/uL (ref 3.77–5.28)
RDW: 14.2 % (ref 11.7–15.4)
WBC: 7.7 10*3/uL (ref 3.4–10.8)

## 2023-05-04 LAB — TSH: TSH: 2.69 u[IU]/mL (ref 0.450–4.500)

## 2023-05-04 LAB — VITAMIN D 25 HYDROXY (VIT D DEFICIENCY, FRACTURES): Vit D, 25-Hydroxy: 29.7 ng/mL — ABNORMAL LOW (ref 30.0–100.0)

## 2023-05-04 LAB — T4, FREE: Free T4: 1.43 ng/dL (ref 0.82–1.77)

## 2023-05-04 NOTE — Progress Notes (Signed)
Contacted via MyChart   Good morning Cathy Mitchell, your labs have returned and overall are stable with exception of mildly low Vitamin D.  Ensure to take Vitamin D3 2000 units daily.  Alkaline phosphatase a little elevated, which we discussed:) Overall stable labs Cathy Mitchell.  Thyroid labs remain in normal range.  Great news on that end:) Any questions? Keep being amazing!!  Thank you for allowing me to participate in your care.  I appreciate you. Kindest regards, Yuko Coventry

## 2023-05-13 DIAGNOSIS — R41840 Attention and concentration deficit: Secondary | ICD-10-CM | POA: Diagnosis not present

## 2023-05-13 DIAGNOSIS — F411 Generalized anxiety disorder: Secondary | ICD-10-CM | POA: Diagnosis not present

## 2023-05-13 DIAGNOSIS — M797 Fibromyalgia: Secondary | ICD-10-CM | POA: Diagnosis not present

## 2023-05-13 DIAGNOSIS — G2581 Restless legs syndrome: Secondary | ICD-10-CM | POA: Diagnosis not present

## 2023-05-13 DIAGNOSIS — F32A Depression, unspecified: Secondary | ICD-10-CM | POA: Diagnosis not present

## 2023-05-13 DIAGNOSIS — F952 Tourette's disorder: Secondary | ICD-10-CM | POA: Diagnosis not present

## 2023-05-14 ENCOUNTER — Encounter: Payer: Self-pay | Admitting: Nurse Practitioner

## 2023-05-19 ENCOUNTER — Encounter: Payer: Self-pay | Admitting: Urology

## 2023-05-19 ENCOUNTER — Other Ambulatory Visit: Payer: Self-pay

## 2023-05-19 MED ORDER — GEMTESA 75 MG PO TABS: 1.0000 | ORAL_TABLET | Freq: Every day | ORAL | 1 refills | Status: DC

## 2023-05-21 ENCOUNTER — Other Ambulatory Visit: Payer: Self-pay

## 2023-06-14 ENCOUNTER — Ambulatory Visit (INDEPENDENT_AMBULATORY_CARE_PROVIDER_SITE_OTHER): Payer: 59 | Admitting: Urology

## 2023-06-14 VITALS — BP 121/75 | HR 82

## 2023-06-14 DIAGNOSIS — N3281 Overactive bladder: Secondary | ICD-10-CM

## 2023-06-14 MED ORDER — OXYBUTYNIN CHLORIDE ER 10 MG PO TB24: 10.0000 mg | ORAL_TABLET | Freq: Every day | ORAL | 11 refills | Status: DC

## 2023-06-14 NOTE — Progress Notes (Signed)
 06/14/2023 1:53 PM   Cathy Mitchell 1965-10-16 [id number]  Referring provider: Marjie Skiff, NP [address] [place],  [place] [address]  Chief Complaint  Patient presents with   Cysto    HPI: SN: Patient has medical comorbidities, a sling placed in 2009, pulling in her groin and urinary incontinence.  Residual was 0 mL   I was consulted to assess the patient's urinary incontinence.  Sometimes she has urge incontinence during the day but does not wear a pad.  She has no stress incontinence with coughing sneezing bending or lifting.  She has no bedwetting but can have foot on the floor syndrome but this is not every night.  Because of this she will wear a pad for confidence.  She thinks her sling is no longer working   She time voids every 1 hour to try to stay dry and cannot hold it for 2 hours.  She says she drinks a lot of fluid.  She voids every 2 hours at night and I believe has some ankle edema.  She reports a good flow   She has had a previous sling.  She has not had a hysterectomy.    On pelvic examination she had a very well supported bladder neck. I cannot feel or see any mesh. No prolapse. No stress incontinence.   The patient has mild urge incontinence especially at night likely associated with a nocturnal diuresis. She is concerned about her sling. She has frequency but drinks a lot of fluids. Role of medication and safety cystoscopy discussed. Role of urodynamics discussed. Patient understood the role of urodynamics but we will hold off them for now. She will come back on Gemtesa samples and prescription for cystoscopy in about 6 weeks   Today Patient was significantly better on [place] but insurance did not want to pay for it.  She leaked a few times.  Frequency stable and clinically not infected Cystoscopy: Patient underwent flexible cystoscopy.  Bladder mucosa and trigone were normal.  No foreign body in bladder or urethra.  No cystitis No stress incontinence with  significant cough after cystoscopy   PMH: Past Medical History:  Diagnosis Date   Anxiety    Arthritis    Back pain    Dementia (HCC)    Depression    Fibromyalgia    Food allergy    [place]   Hypertension    no longer has hypertension   Joint pain    Kidney problem    Neuromuscular disorder (HCC)    L-sided tremors   Osteoarthritis    Other fatigue    Rheumatoid arthritis (HCC)    Sleep apnea    does not use C-PAP    Surgical History: Past Surgical History:  Procedure Laterality Date   CARPAL TUNNEL RELEASE Bilateral 2007   DILATION AND CURETTAGE OF UTERUS     GANGLION CYST EXCISION Bilateral    GANGLION CYST EXCISION Right 10/08/2015   Procedure: REMOVAL GANGLION OF WRIST;  Surgeon: Kennedy Bucker, MD;  Location: [facility];  Service: Orthopedics;  Laterality: Right;   GASTRIC BYPASS     HAND SURGERY Bilateral Resection   HIP SURGERY     HYSTEROSCOPY WITH D & C N/A 05/24/2017   Procedure: DILATATION AND CURETTAGE /HYSTEROSCOPY;  Surgeon: Defrancesco, Prentice Docker, MD;  Location: [facility];  Service: Gynecology;  Laterality: N/A;   INCONTINENCE SURGERY  2009   LEEP N/A 05/24/2017   Procedure: LOOP ELECTROSURGICAL EXCISION PROCEDURE (LEEP);  Surgeon: Defrancesco,  Prentice Docker, MD;  Location: [facility];  Service: Gynecology;  Laterality: N/A;   TUBAL LIGATION      Home Medications:  Allergies as of 06/14/2023       Reactions   Oxycodone-acetaminophen Itching, Other (See Comments)   Reaction:  Dizziness and blurred vision    Other Other (See Comments)   Patient states she is allergic to all narcotics   Percocet [oxycodone-acetaminophen] Nausea And Vomiting, Other (See Comments)   Reaction:  Dizziness and blurred vision  Reaction:  Dizziness and blurred vision         Medication List        Accurate as of June 14, 2023  1:53 PM. If you have any questions, ask your nurse or doctor.          acetaminophen 500 MG tablet Commonly known as: TYLENOL Take 1,500 mg by  mouth daily as needed for mild pain.   ARIPiprazole 5 MG tablet Commonly known as: Abilify Take 1 tablet (5 mg total) by mouth daily.   Calcium High Potency/Vitamin D 600-5 MG-MCG Tabs Generic drug: Calcium Carb-Cholecalciferol Take by mouth.   celecoxib 100 MG capsule Commonly known as: CELEBREX TAKE 1 CAPSULE (100 MG TOTAL) BY MOUTH DAILY AS NEEDED FOR MODERATE PAIN.   citalopram 40 MG tablet Commonly known as: CELEXA Take 1 tablet (40 mg total) by mouth daily.   clonazePAM 0.5 MG tablet Commonly known as: KLONOPIN Take 0.5 mg by mouth 2 (two) times daily as needed for anxiety.   diclofenac Sodium 1 % Gel Commonly known as: VOLTAREN Apply 2 g topically 4 (four) times daily.   dicyclomine 20 MG tablet Commonly known as: BENTYL Take 1 tablet (20 mg total) by mouth 3 (three) times daily. What changed:  when to take this reasons to take this   diltiazem 120 MG tablet Commonly known as: CARDIZEM Take 1 tablet (120 mg total) by mouth 3 (three) times daily.   gabapentin 300 MG capsule Commonly known as: NEURONTIN Take 1 capsule (300 mg total) by mouth 3 (three) times daily.   Gemtesa 75 MG Tabs Generic drug: Vibegron Take 1 tablet (75 mg total) by mouth daily.   levothyroxine 175 MCG tablet Commonly known as: SYNTHROID Take 1 tablet (175 mcg total) by mouth daily before breakfast.   MAGNESIUM PO Take by mouth.   meclizine 12.5 MG tablet Commonly known as: ANTIVERT Take 1 tablet (12.5 mg total) by mouth 3 (three) times daily as needed for dizziness.   metFORMIN 500 MG tablet Commonly known as: GLUCOPHAGE Take 1 tablet (500 mg total) by mouth 2 (two) times daily with a meal.   mometasone 0.1 % lotion Commonly known as: ELOCON 2 (two) times daily.   topiramate 200 MG tablet Commonly known as: TOPAMAX Take 200 mg by mouth at bedtime.   Vitamin D (Ergocalciferol) 1.25 MG (50000 UNIT) Caps capsule Commonly known as: DRISDOL TAKE 1 CAPSULE BY MOUTH ONE TIME  PER WEEK        Allergies:  Allergies  Allergen Reactions   Oxycodone-Acetaminophen Itching and Other (See Comments)    Reaction:  Dizziness and blurred vision    Other Other (See Comments)    Patient states she is allergic to all narcotics   Percocet [Oxycodone-Acetaminophen] Nausea And Vomiting and Other (See Comments)    Reaction:  Dizziness and blurred vision  Reaction:  Dizziness and blurred vision     Family History: Family History  Problem Relation Age of Onset   Hypertension  Mother    Depression Mother    Anxiety disorder Mother    Obesity Mother    COPD Father    Hypertension Father    Depression Father    Anxiety disorder Father    Obesity Father    Thyroid disease Daughter    Heart disease Maternal Grandfather    Diabetes Paternal Grandfather    Heart disease Paternal Grandfather    Eczema Son    Breast cancer Neg Hx     Social History:  reports that she quit smoking about 8 years ago. Her smoking use included cigarettes. She started smoking about 37 years ago. She has a 14.5 pack-year smoking history. She has been exposed to tobacco smoke. She has never used smokeless tobacco. She reports that she does not drink alcohol and does not use drugs.  ROS:                                        Physical Exam: BP 121/75   Pulse 82   LMP 02/09/2017 (Within Weeks)   Constitutional:  Alert and oriented, No acute distress. HEENT: [place] AT, moist mucus membranes.  Trachea midline, no masses.   Laboratory Data: Lab Results  Component Value Date   WBC 7.7 05/03/2023   HGB 12.1 05/03/2023   HCT 37.7 05/03/2023   MCV 85 05/03/2023   PLT 398 05/03/2023    Lab Results  Component Value Date   CREATININE 0.82 05/03/2023    No results found for: "PSA"  No results found for: "TESTOSTERONE"  Lab Results  Component Value Date   HGBA1C 5.3 05/03/2023    Urinalysis    Component Value Date/Time   COLORURINE YELLOW 02/02/2023 1313    APPEARANCEUR CLEAR 02/02/2023 1313   APPEARANCEUR Cloudy (A) 11/16/2018 1354   LABSPEC 1.015 02/02/2023 1313   PHURINE 7.0 02/02/2023 1313   GLUCOSEU NEGATIVE 02/02/2023 1313   HGBUR NEGATIVE 02/02/2023 1313   BILIRUBINUR NEGATIVE 02/02/2023 1313   BILIRUBINUR Negative 11/16/2018 1354   KETONESUR NEGATIVE 02/02/2023 1313   PROTEINUR NEGATIVE 02/02/2023 1313   NITRITE NEGATIVE 02/02/2023 1313   LEUKOCYTESUR NEGATIVE 02/02/2023 1313    Pertinent Imaging:   Assessment & Plan: Reevaluate in 6 weeks on oxybutynin ER 10 mg 3 x 11.  Try to find medication that works is affordable  1. OAB (overactive bladder) (Primary)  - Urinalysis, Complete   No follow-ups on file.  Martina Sinner, MD  [facility] [address], [address] [place], [place] [address] [phone]

## 2023-06-15 ENCOUNTER — Other Ambulatory Visit: Payer: Self-pay

## 2023-06-15 LAB — MICROSCOPIC EXAMINATION: Bacteria, UA: NONE SEEN

## 2023-06-15 LAB — URINALYSIS, COMPLETE
Bilirubin, UA: NEGATIVE
Glucose, UA: NEGATIVE
Ketones, UA: NEGATIVE
Leukocytes,UA: NEGATIVE
Nitrite, UA: NEGATIVE
Protein,UA: NEGATIVE
Specific Gravity, UA: 1.015 (ref 1.005–1.030)
Urobilinogen, Ur: 0.2 mg/dL (ref 0.2–1.0)
pH, UA: 5.5 (ref 5.0–7.5)

## 2023-06-17 ENCOUNTER — Encounter: Payer: Self-pay | Admitting: Nurse Practitioner

## 2023-06-24 ENCOUNTER — Ambulatory Visit: Payer: Self-pay

## 2023-06-24 NOTE — Telephone Encounter (Signed)
  Chief Complaint: Dizziness from possible ear infections per patient Symptoms: Dizziness and lightheadedness. Frequency: started today Pertinent Negatives: Patient denies fever Disposition: [] ED /[] Urgent Care (no appt availability in office) / [] Appointment(In office/virtual)/ []  [facility]/ [] Home Care/ [] Refused Recommended Disposition /[] [facility]/ [x]  Follow-up with PCP Additional Notes: patient with hx of recurrent ear infections calling with concerns for ear infections due to lightheadedness and dizziness patient is experiencing. Patient states dizziness and lightheadedness that she is having are same symptoms when she is diagnosed with ear infections. Patient is asking if possible to be seen in the office. Per protocol patient was recommended to UC due to no availability in the office. Patient is asking for a phone call back about an appointment. Patient verbalized understanding of plan and all questions answered.    Copied from CRM [id number]. Topic: Clinical - Red Word Triage >> Jun 24, 2023 12:41 PM [place] B wrote: [name] that prompted transfer to Nurse Triage: pt has lightheadedness, dizzy and can't walk straight, says she had double ear infection Reason for Disposition  [1] MODERATE dizziness (e.g., interferes with normal activities) AND [2] has been evaluated by doctor (or NP/PA) for this  Answer Assessment - Initial Assessment Questions 1. DESCRIPTION: "Describe your dizziness."     Lightheadedness with dizzy and can't walk a straight line 2. LIGHTHEADED: "Do you feel lightheaded?" (e.g., somewhat faint, woozy, weak upon standing)     yes 3. VERTIGO: "Do you feel like either you or the room is spinning or tilting?" (i.e. vertigo)     yes 4. SEVERITY: "How bad is it?"  "Do you feel like you are going to faint?" "Can you stand and walk?"   - MILD: Feels slightly dizzy, but walking normally.   - MODERATE: Feels unsteady when walking, but not falling;  interferes with normal activities (e.g., school, work).   - SEVERE: Unable to walk without falling, or requires assistance to walk without falling; feels like passing out now.      Moderate 5. ONSET:  "When did the dizziness begin?"     Dizziness started today 6. AGGRAVATING FACTORS: "Does anything make it worse?" (e.g., standing, change in head position)     Doing anything 7. HEART RATE: "Can you tell me your heart rate?" "How many beats in 15 seconds?"  (Note: not all patients can do this)       N/A 8. CAUSE: "What do you think is causing the dizziness?"     Patient is thinking she has an ear infection 9. RECURRENT SYMPTOM: "Have you had dizziness before?" If Yes, ask: "When was the last time?" "What happened that time?"     Patient states a couple of visits ago 10. OTHER SYMPTOMS: "Do you have any other symptoms?" (e.g., fever, chest pain, vomiting, diarrhea, bleeding)       Nausea  Protocols used: Dizziness - Lightheadedness-A-AH

## 2023-06-24 NOTE — Telephone Encounter (Signed)
 Routing to providers in office to see if patient can be worked in. Please advise.

## 2023-06-25 ENCOUNTER — Ambulatory Visit (INDEPENDENT_AMBULATORY_CARE_PROVIDER_SITE_OTHER): Admitting: Nurse Practitioner

## 2023-06-25 ENCOUNTER — Encounter: Payer: Self-pay | Admitting: Nurse Practitioner

## 2023-06-25 VITALS — BP 120/84 | HR 74 | Ht 68.0 in | Wt 335.8 lb

## 2023-06-25 DIAGNOSIS — H6693 Otitis media, unspecified, bilateral: Secondary | ICD-10-CM | POA: Diagnosis not present

## 2023-06-25 MED ORDER — AMOXICILLIN 875 MG PO TABS: 875.0000 mg | ORAL_TABLET | Freq: Two times a day (BID) | ORAL | 0 refills | Status: DC

## 2023-06-25 NOTE — Progress Notes (Signed)
 BP 120/84 (BP Location: Left Arm, Patient Position: Sitting, Cuff Size: Large)   Pulse 74   Ht 5\' 8"  (1.727 m)   Wt (!) 335 lb 12.8 oz (152.3 kg)   LMP 02/09/2017 (Within Weeks)   SpO2 96%   BMI 51.06 kg/m    Subjective:    Patient ID: Cathy Mitchell, female    DOB: [birth year], 58 y.o.   MRN: [id number]  HPI: Cathy Mitchell is a 58 y.o. female  Chief Complaint  Patient presents with   Dizziness   Ear Pain    Bilateral ears, noticed yesterday, drainage    EAR PAIN Duration:  started yesterday Involved ear(s): bilateral Severity:  7/10  Quality:  dull and throbbing Fever: no Otorrhea: yes Upper respiratory infection symptoms: yes Pruritus: yes Hearing loss: no Water immersion no Using Q-tips: yes Recurrent otitis media: yes Status: worse Treatments attempted: none  Relevant past medical, surgical, family and social history reviewed and updated as indicated. Interim medical history since our last visit reviewed. Allergies and medications reviewed and updated.  Review of Systems  HENT:  Positive for ear pain. Negative for congestion.   Respiratory:  Positive for cough.   Neurological:  Positive for dizziness.    Per HPI unless specifically indicated above     Objective:    BP 120/84 (BP Location: Left Arm, Patient Position: Sitting, Cuff Size: Large)   Pulse 74   Ht 5\' 8"  (1.727 m)   Wt (!) 335 lb 12.8 oz (152.3 kg)   LMP 02/09/2017 (Within Weeks)   SpO2 96%   BMI 51.06 kg/m   Wt Readings from Last 3 Encounters:  06/25/23 (!) 335 lb 12.8 oz (152.3 kg)  05/03/23 (!) 333 lb 6.4 oz (151.2 kg)  04/26/23 (!) 335 lb 3.2 oz (152 kg)    Physical Exam Vitals and nursing note reviewed.  Constitutional:      General: She is not in acute distress.    Appearance: Normal appearance. She is normal weight. She is not ill-appearing, toxic-appearing or diaphoretic.  HENT:     Head: Normocephalic.     Right Ear: External ear normal. A middle ear effusion is present.      Left Ear: External ear normal. A middle ear effusion is present.     Nose: Nose normal. No congestion or rhinorrhea.     Mouth/Throat:     Mouth: Mucous membranes are moist.     Pharynx: Oropharynx is clear.  Eyes:     General:        Right eye: No discharge.        Left eye: No discharge.     Extraocular Movements: Extraocular movements intact.     Conjunctiva/sclera: Conjunctivae normal.     Pupils: Pupils are equal, round, and reactive to light.  Cardiovascular:     Rate and Rhythm: Normal rate and regular rhythm.     Heart sounds: No murmur heard. Pulmonary:     Effort: Pulmonary effort is normal. No respiratory distress.     Breath sounds: Normal breath sounds. No wheezing or rales.  Musculoskeletal:     Cervical back: Normal range of motion and neck supple.  Skin:    General: Skin is warm and dry.     Capillary Refill: Capillary refill takes less than 2 seconds.  Neurological:     General: No focal deficit present.     Mental Status: She is alert and oriented to person, place, and time. Mental status is  at baseline.  Psychiatric:        Mood and Affect: Mood normal.        Behavior: Behavior normal.        Thought Content: Thought content normal.        Judgment: Judgment normal.     Results for orders placed or performed in visit on 06/14/23  Microscopic Examination   Collection Time: 06/14/23  1:42 PM   Urine  Result Value Ref Range   WBC, UA 0-5 0 - 5 /hpf   RBC, Urine 0-2 0 - 2 /hpf   Epithelial Cells (non renal) 0-10 0 - 10 /hpf   Bacteria, UA None seen None seen/Few  Urinalysis, Complete   Collection Time: 06/14/23  1:42 PM  Result Value Ref Range   Specific Gravity, UA 1.015 1.005 - 1.030   pH, UA 5.5 5.0 - 7.5   Color, UA Yellow Yellow   Appearance Ur Clear Clear   Leukocytes,UA Negative Negative   Protein,UA Negative Negative/Trace   Glucose, UA Negative Negative   Ketones, UA Negative Negative   RBC, UA Trace (A) Negative   Bilirubin, UA  Negative Negative   Urobilinogen, Ur 0.2 0.2 - 1.0 mg/dL   Nitrite, UA Negative Negative   Microscopic Examination See below:       Assessment & Plan:   Problem List Items Addressed This Visit   None Visit Diagnoses       Acute otitis media, bilateral    -  Primary   Chronic problem for patient. Will treat with Amoxicllin twice daily for 10 days. Follow up with PCP if not improved.   Relevant Medications   amoxicillin (AMOXIL) 875 MG tablet        Follow up plan: No follow-ups on file.

## 2023-07-28 ENCOUNTER — Encounter: Payer: Self-pay | Admitting: Nurse Practitioner

## 2023-07-28 ENCOUNTER — Ambulatory Visit (INDEPENDENT_AMBULATORY_CARE_PROVIDER_SITE_OTHER): Admitting: Nurse Practitioner

## 2023-07-28 VITALS — BP 125/73 | HR 81 | Temp 98.4°F | Resp 17 | Ht 67.99 in | Wt 341.6 lb

## 2023-07-28 DIAGNOSIS — R0602 Shortness of breath: Secondary | ICD-10-CM | POA: Diagnosis not present

## 2023-07-28 MED ORDER — METHYLPREDNISOLONE 4 MG PO TBPK: ORAL_TABLET | ORAL | 0 refills | Status: DC

## 2023-07-28 MED ORDER — ALBUTEROL SULFATE HFA 108 (90 BASE) MCG/ACT IN AERS: 2.0000 | INHALATION_SPRAY | Freq: Four times a day (QID) | RESPIRATORY_TRACT | 0 refills | Status: DC | PRN

## 2023-07-28 NOTE — Progress Notes (Signed)
 BP 125/73 (BP Location: Left Arm, Patient Position: Sitting, Cuff Size: Large)   Pulse 81   Temp 98.4 F (36.9 C) (Oral)   Resp 17   Ht 5' 7.99" (1.727 m)   Wt (!) 341 lb 9.6 oz (154.9 kg)   LMP 02/09/2017 (Within Weeks)   SpO2 98%   BMI 51.95 kg/m    Subjective:    Patient ID: Cathy Mitchell, female    DOB: May 01, 1965, 58 y.o.   MRN: [id number]  HPI: Cathy Mitchell is a 58 y.o. female  Chief Complaint  Patient presents with   Cough    For about a month, deep heavy cough, sneezing, no watery eyes, chest rattle, SOB and unable to catch her breathe. Using diffuser in all areas at home.Dizziness with coughing spells.    SHORTNESS OF BREATH Duration: Patient states her symptoms started over a month ago.  Feels like it is more to it than allergies.  She has tried zyrtec  and claritin.  Onset: gradual Description of breathing discomfort: difficulty breathing Severity: moderate Episode duration: about 1 month Frequency: constantly  Related to exertion: no Cough: no Chest tightness: no Wheezing: yes Fevers: no Chest pain: no Palpitations: yes  Nausea: no Diaphoresis: no Deconditioning: no Status: stable Aggravating factors: coughing fits Alleviating factors: being home and not leaving home Treatments attempted: antihistamines   Relevant past medical, surgical, family and social history reviewed and updated as indicated. Interim medical history since our last visit reviewed. Allergies and medications reviewed and updated.  Review of Systems  Constitutional:  Negative for fever.  Respiratory:  Positive for cough, shortness of breath and wheezing.   Cardiovascular:  Positive for palpitations. Negative for chest pain.  Gastrointestinal:  Negative for nausea.    Per HPI unless specifically indicated above     Objective:    BP 125/73 (BP Location: Left Arm, Patient Position: Sitting, Cuff Size: Large)   Pulse 81   Temp 98.4 F (36.9 C) (Oral)   Resp 17   Ht 5'  7.99" (1.727 m)   Wt (!) 341 lb 9.6 oz (154.9 kg)   LMP 02/09/2017 (Within Weeks)   SpO2 98%   BMI 51.95 kg/m   Wt Readings from Last 3 Encounters:  07/28/23 (!) 341 lb 9.6 oz (154.9 kg)  06/25/23 (!) 335 lb 12.8 oz (152.3 kg)  05/03/23 (!) 333 lb 6.4 oz (151.2 kg)    Physical Exam Vitals and nursing note reviewed.  Constitutional:      General: She is not in acute distress.    Appearance: Normal appearance. She is normal weight. She is not ill-appearing, toxic-appearing or diaphoretic.  HENT:     Head: Normocephalic.     Right Ear: Tympanic membrane and external ear normal.     Left Ear: Tympanic membrane and external ear normal.     Nose: Rhinorrhea present. No congestion.     Mouth/Throat:     Mouth: Mucous membranes are moist.     Pharynx: Oropharynx is clear. Posterior oropharyngeal erythema present. No oropharyngeal exudate.  Eyes:     General:        Right eye: No discharge.        Left eye: No discharge.     Extraocular Movements: Extraocular movements intact.     Conjunctiva/sclera: Conjunctivae normal.     Pupils: Pupils are equal, round, and reactive to light.  Cardiovascular:     Rate and Rhythm: Normal rate and regular rhythm.     Heart  sounds: No murmur heard. Pulmonary:     Effort: Pulmonary effort is normal. No respiratory distress.     Breath sounds: Normal breath sounds. No wheezing or rales.  Musculoskeletal:     Cervical back: Normal range of motion and neck supple.  Skin:    General: Skin is warm and dry.     Capillary Refill: Capillary refill takes less than 2 seconds.  Neurological:     General: No focal deficit present.     Mental Status: She is alert and oriented to person, place, and time. Mental status is at baseline.  Psychiatric:        Mood and Affect: Mood normal.        Behavior: Behavior normal.        Thought Content: Thought content normal.        Judgment: Judgment normal.     Results for orders placed or performed in visit on  06/14/23  Microscopic Examination   Collection Time: 06/14/23  1:42 PM   Urine  Result Value Ref Range   WBC, UA 0-5 0 - 5 /hpf   RBC, Urine 0-2 0 - 2 /hpf   Epithelial Cells (non renal) 0-10 0 - 10 /hpf   Bacteria, UA None seen None seen/Few  Urinalysis, Complete   Collection Time: 06/14/23  1:42 PM  Result Value Ref Range   Specific Gravity, UA 1.015 1.005 - 1.030   pH, UA 5.5 5.0 - 7.5   Color, UA Yellow Yellow   Appearance Ur Clear Clear   Leukocytes,UA Negative Negative   Protein,UA Negative Negative/Trace   Glucose, UA Negative Negative   Ketones, UA Negative Negative   RBC, UA Trace (A) Negative   Bilirubin, UA Negative Negative   Urobilinogen, Ur 0.2 0.2 - 1.0 mg/dL   Nitrite, UA Negative Negative   Microscopic Examination See below:       Assessment & Plan:   Problem List Items Addressed This Visit   None Visit Diagnoses       Shortness of breath    -  Primary   Ongoing x 1 month. Continue with antihistamine.  Will treat with medrol  dose pak and albuterol .  follow up in 1 week with PCP.        Follow up plan: Return in about 1 week (around 08/04/2023).

## 2023-08-01 NOTE — Patient Instructions (Signed)

## 2023-08-02 DIAGNOSIS — F32A Depression, unspecified: Secondary | ICD-10-CM | POA: Diagnosis not present

## 2023-08-02 DIAGNOSIS — G2581 Restless legs syndrome: Secondary | ICD-10-CM | POA: Diagnosis not present

## 2023-08-02 DIAGNOSIS — M797 Fibromyalgia: Secondary | ICD-10-CM | POA: Diagnosis not present

## 2023-08-02 DIAGNOSIS — F952 Tourette's disorder: Secondary | ICD-10-CM | POA: Diagnosis not present

## 2023-08-02 DIAGNOSIS — F411 Generalized anxiety disorder: Secondary | ICD-10-CM | POA: Diagnosis not present

## 2023-08-02 DIAGNOSIS — R41840 Attention and concentration deficit: Secondary | ICD-10-CM | POA: Diagnosis not present

## 2023-08-04 ENCOUNTER — Ambulatory Visit
Admission: RE | Admit: 2023-08-04 | Discharge: 2023-08-04 | Disposition: A | Discharge status: Home / Self Care | Attending: Nurse Practitioner | Admitting: Nurse Practitioner

## 2023-08-04 ENCOUNTER — Ambulatory Visit
Admission: RE | Admit: 2023-08-04 | Discharge: 2023-08-04 | Disposition: A | Discharge status: Home / Self Care | Source: Ambulatory Visit | Attending: Nurse Practitioner | Admitting: Nurse Practitioner

## 2023-08-04 ENCOUNTER — Encounter: Payer: Self-pay | Admitting: Nurse Practitioner

## 2023-08-04 ENCOUNTER — Ambulatory Visit (INDEPENDENT_AMBULATORY_CARE_PROVIDER_SITE_OTHER): Payer: 59 | Admitting: Nurse Practitioner

## 2023-08-04 VITALS — BP 125/77 | HR 80 | Temp 97.8°F | Ht 68.0 in | Wt 341.4 lb

## 2023-08-04 DIAGNOSIS — R0602 Shortness of breath: Secondary | ICD-10-CM

## 2023-08-04 DIAGNOSIS — F331 Major depressive disorder, recurrent, moderate: Secondary | ICD-10-CM | POA: Diagnosis not present

## 2023-08-04 DIAGNOSIS — Z6841 Body Mass Index (BMI) 40.0 and over, adult: Secondary | ICD-10-CM | POA: Diagnosis not present

## 2023-08-04 DIAGNOSIS — E890 Postprocedural hypothyroidism: Secondary | ICD-10-CM

## 2023-08-04 DIAGNOSIS — F900 Attention-deficit hyperactivity disorder, predominantly inattentive type: Secondary | ICD-10-CM

## 2023-08-04 DIAGNOSIS — R051 Acute cough: Secondary | ICD-10-CM

## 2023-08-04 DIAGNOSIS — R0989 Other specified symptoms and signs involving the circulatory and respiratory systems: Secondary | ICD-10-CM | POA: Diagnosis not present

## 2023-08-04 DIAGNOSIS — E66813 Obesity, class 3: Secondary | ICD-10-CM | POA: Diagnosis not present

## 2023-08-04 DIAGNOSIS — F418 Other specified anxiety disorders: Secondary | ICD-10-CM | POA: Diagnosis not present

## 2023-08-04 DIAGNOSIS — R059 Cough, unspecified: Secondary | ICD-10-CM | POA: Diagnosis not present

## 2023-08-04 MED ORDER — TRIAMCINOLONE ACETONIDE 40 MG/ML IJ SUSP
40.0000 mg | Freq: Once | INTRAMUSCULAR | Status: AC
  Administered 2023-08-04: 40 mg via INTRAMUSCULAR

## 2023-08-04 MED ORDER — GABAPENTIN 300 MG PO CAPS: 300.0000 mg | ORAL_CAPSULE | Freq: Three times a day (TID) | ORAL | 4 refills | Status: DC

## 2023-08-04 MED ORDER — AMOXICILLIN-POT CLAVULANATE 875-125 MG PO TABS: 1.0000 | ORAL_TABLET | Freq: Two times a day (BID) | ORAL | 0 refills | Status: DC

## 2023-08-04 MED ORDER — IPRATROPIUM-ALBUTEROL 0.5-2.5 (3) MG/3ML IN SOLN
3.0000 mL | Freq: Once | RESPIRATORY_TRACT | Status: AC
  Administered 2023-08-04: 3 mL via RESPIRATORY_TRACT

## 2023-08-04 NOTE — Progress Notes (Signed)
 BP 125/77   Pulse 80   Temp 97.8 F (36.6 C) (Oral)   Ht 5\' 8"  (1.727 m)   Wt (!) 341 lb 6.4 oz (154.9 kg)   LMP 02/09/2017 (Within Weeks)   SpO2 97%   BMI 51.91 kg/m    Subjective:    Patient ID: Cathy Mitchell, female    DOB: 1965/12/09, 58 y.o.   MRN: [id number]  HPI: Cathy Mitchell is a 58 y.o. female  Chief Complaint  Patient presents with   Hypothyroidism   Obesity   Shortness of Breath   HYPOTHYROIDISM Continues Levothyroxine  175 MCG. Thyroid  control status:stable Satisfied with current treatment? yes Medication side effects: no Medication compliance: good compliance Etiology of hypothyroidism: post cancer Recent dose adjustment:no Fatigue: yes Cold intolerance: no Heat intolerance: no Weight gain: no Weight loss: no Constipation: no Diarrhea/loose stools: no Palpitations: no Lower extremity edema: no Anxiety/depressed mood: no   SHORTNESS OF BREATH Was seen for this on 08/19/23 and provided a Medrol  dose pack and Albuterol . If lies down flat and does not move she is fine. She reports this is not working.  Covid tests at home have been negative.  Has had shortness of breath since around 07/21/23, only had SOB and dizziness at the time.  This progressively got worse.  She takes Claritin and this did not help, switched to Zyrtec  and this did not help. Denies chest pain with her SOB.  Has been coughing, she reports like crazy a dry cough with no goobers.  Cough has subsided some with steroid, but when gets to coughing has a whole fit and gets SOB.  Taking a deep breath does not hurt. Recurrent headaches: no Visual changes: no Palpitations: no Dyspnea: yes Chest pain: no Lower extremity edema: no Dizzy/lightheaded: no   ANXIETY/STRESS Sees psychiatry, with recent visit. No recent medication changes. Duration:stable Anxious mood: no  Excessive worrying: no Irritability: no  Sweating: no Nausea: no Palpitations:no Hyperventilation: no Panic attacks:  no Agoraphobia: no  Obscessions/compulsions: no Depressed mood: no    08/19/2023    1:25 PM 05/03/2023   10:36 AM 01/29/2023   11:11 AM 11/24/2022    8:22 AM 07/17/2022   10:11 AM  Depression screen PHQ 2/9  Decreased Interest 0 0 0 0 0  Down, Depressed, Hopeless 0 2 0 0 0  PHQ - 2 Score 0 2 0 0 0  Altered sleeping 3 2 3 2 3   Tired, decreased energy 0 1 0 1 1  Change in appetite 3 1 0 2 3  Feeling bad or failure about yourself  0 3 0 0 0  Trouble concentrating 0 3 0 1 1  Moving slowly or fidgety/restless 0 0 3 0 1  Suicidal thoughts 0 0 0 0 0  PHQ-9 Score 6 12 6 6 9   Difficult doing work/chores Not difficult at all Somewhat difficult  Not difficult at all Somewhat difficult  Anhedonia: no Weight changes: no Insomnia: none Hypersomnia: no Fatigue/loss of energy: yes Feelings of worthlessness: no Feelings of guilt: no Impaired concentration/indecisiveness: no Suicidal ideations: no  Crying spells: no Recent Stressors/Life Changes: no   Relationship problems: no   Family stress: no     Financial stress: no    Job stress: no    Recent death/loss: no     08-19-23    1:25 PM 05/03/2023   10:37 AM 01/29/2023   11:10 AM 11/24/2022    8:23 AM  GAD 7 : Generalized Anxiety Score  Nervous, Anxious, on Edge 0 2 0 0  Control/stop worrying 0 1 0 0  Worry too much - different things 0 1 0 0  Trouble relaxing 0 1 0 1  Restless 3 3 0 1  Easily annoyed or irritable 0 0 0 0  Afraid - awful might happen 0 0 0 0  Total GAD 7 Score 3 8 0 2  Anxiety Difficulty Not difficult at all Somewhat difficult  Not difficult at all      Relevant past medical, surgical, family and social history reviewed and updated as indicated. Interim medical history since our last visit reviewed. Allergies and medications reviewed and updated.  Review of Systems  Constitutional:  Positive for fatigue (a little). Negative for activity change, appetite change, diaphoresis and fever.  HENT:  Negative for  congestion, postnasal drip, rhinorrhea, sinus pressure, sinus pain, sore throat and trouble swallowing.   Respiratory:  Positive for cough and shortness of breath. Negative for chest tightness and wheezing.   Cardiovascular:  Negative for chest pain, palpitations and leg swelling.  Gastrointestinal: Negative.   Neurological:  Positive for dizziness (if coughs too much). Negative for tremors, weakness, light-headedness and headaches.  Psychiatric/Behavioral: Negative.      Per HPI unless specifically indicated above     Objective:    BP 125/77   Pulse 80   Temp 97.8 F (36.6 C) (Oral)   Ht 5\' 8"  (1.727 m)   Wt (!) 341 lb 6.4 oz (154.9 kg)   LMP 02/09/2017 (Within Weeks)   SpO2 97%   BMI 51.91 kg/m   Wt Readings from Last 3 Encounters:  08/04/23 (!) 341 lb 6.4 oz (154.9 kg)  07/28/23 (!) 341 lb 9.6 oz (154.9 kg)  06/25/23 (!) 335 lb 12.8 oz (152.3 kg)    Physical Exam Vitals and nursing note reviewed.  Constitutional:      General: She is awake. She is not in acute distress.    Appearance: She is well-developed and well-groomed. She is morbidly obese. She is not ill-appearing.  HENT:     Head: Normocephalic.     Right Ear: Hearing, tympanic membrane, ear canal and external ear normal. No middle ear effusion. There is no impacted cerumen. Tympanic membrane is not injected.     Left Ear: Hearing, tympanic membrane, ear canal and external ear normal.  No middle ear effusion. There is no impacted cerumen. Tympanic membrane is not injected.     Nose: No rhinorrhea.     Right Sinus: No maxillary sinus tenderness or frontal sinus tenderness.     Left Sinus: No maxillary sinus tenderness or frontal sinus tenderness.     Mouth/Throat:     Mouth: Mucous membranes are moist.     Pharynx: Posterior oropharyngeal erythema (mild cobblestone pattern) present. No pharyngeal swelling or oropharyngeal exudate.     Comments: Hoarseness with talking. Eyes:     General: Lids are normal.         Right eye: No discharge.        Left eye: No discharge.     Conjunctiva/sclera: Conjunctivae normal.     Pupils: Pupils are equal, round, and reactive to light.  Neck:     Thyroid : No thyromegaly.     Vascular: No carotid bruit or JVD.  Cardiovascular:     Rate and Rhythm: Normal rate and regular rhythm.     Heart sounds: Normal heart sounds. No murmur heard.    No gallop.  Pulmonary:  Effort: Pulmonary effort is normal. No accessory muscle usage, respiratory distress or retractions.     Breath sounds: Wheezing present. No decreased breath sounds, rhonchi or rales.     Comments: Occasional scattered expiratory wheezes throughout + some mild coarser breath sounds upper lobes, but this may be reverberating from her making noises with deeper breaths. Able to talk and answer questions. Abdominal:     General: Bowel sounds are normal.     Palpations: Abdomen is soft.  Musculoskeletal:     Cervical back: Normal range of motion and neck supple.     Right lower leg: No edema.     Left lower leg: No edema.  Lymphadenopathy:     Cervical: No cervical adenopathy.  Skin:    General: Skin is warm and dry.  Neurological:     Mental Status: She is alert and oriented to person, place, and time.     Motor: Tremor (per baseline upper extremities) present.     Gait: Gait is intact.     Deep Tendon Reflexes:     Reflex Scores:      Brachioradialis reflexes are 1+ on the right side and 1+ on the left side.      Patellar reflexes are 1+ on the right side and 1+ on the left side. Psychiatric:        Attention and Perception: Attention normal.        Mood and Affect: Mood normal.        Speech: Speech normal.        Behavior: Behavior normal. Behavior is cooperative.        Thought Content: Thought content normal.     Results for orders placed or performed in visit on 06/14/23  Microscopic Examination   Collection Time: 06/14/23  1:42 PM   Urine  Result Value Ref Range   WBC, UA 0-5 0 - 5  /hpf   RBC, Urine 0-2 0 - 2 /hpf   Epithelial Cells (non renal) 0-10 0 - 10 /hpf   Bacteria, UA None seen None seen/Few  Urinalysis, Complete   Collection Time: 06/14/23  1:42 PM  Result Value Ref Range   Specific Gravity, UA 1.015 1.005 - 1.030   pH, UA 5.5 5.0 - 7.5   Color, UA Yellow Yellow   Appearance Ur Clear Clear   Leukocytes,UA Negative Negative   Protein,UA Negative Negative/Trace   Glucose, UA Negative Negative   Ketones, UA Negative Negative   RBC, UA Trace (A) Negative   Bilirubin, UA Negative Negative   Urobilinogen, Ur 0.2 0.2 - 1.0 mg/dL   Nitrite, UA Negative Negative   Microscopic Examination See below:       Assessment & Plan:   Problem List Items Addressed This Visit       Endocrine   Hypothyroid   Chronic and ongoing due to thyroid  cancer and removal on 04/27/22.  Will continue current Levothyroxine  dosing and adjust as needed.  Recent thyroid  labs in normal range.        Other   Situational anxiety   Refer to depression plan of care.      Shortness of breath - Primary   Ongoing for a little over 2 weeks now with worsening she reports.  Will obtain STAT imaging of chest, discussed with her and she will obtain after this visit.  Start Augmentin  BID for 7 days.  Provided Duoneb in office today and Kenalog 40 MG, educated her on this plan of care and medications.  No red flags on exam.  Will determine next step after imaging returns, but have advised her to immediately go to ER if any worsening symptoms present.  She agrees with this plan.      Relevant Medications   triamcinolone  acetonide (KENALOG-40) injection 40 mg   ipratropium-albuterol  (DUONEB) 0.5-2.5 (3) MG/3ML nebulizer solution 3 mL   Other Relevant Orders   DG Chest 2 View   CBC with Differential/Platelet   Comprehensive metabolic panel with GFR   Obesity   BMI 51.91, cannot get injectables due to cost.  In past she lost significant weight with Wegovy , which offered benefit to her chronic  pain.  Continue Topamax and Metformin  500 MG BID to see if benefit to weight loss. Educated her on this.  Recommended eating smaller high protein, low fat meals more frequently and exercising 30 mins a day 5 times a week with a goal of 10-15lb weight loss in the next 3 months. Patient voiced their understanding and motivation to adhere to these recommendations.       Depression   Chronic, ongoing, currently followed by psychiatry -- [facility].  Denies SI/HI.  Continue current medication regimen as ordered by psychiatry and attempt to get notes next visit.  Discussed at length with her today.        Attention deficit hyperactivity disorder (ADHD), predominantly inattentive type   Previously followed by [facility] with psychiatry -- at this time continue current medication regimen as prescribed by them.        Acute cough   Ongoing for a little over 2 weeks now with worsening she reports.  Will obtain STAT imaging of chest, discussed with her and she will obtain after this visit.  Start Augmentin  BID for 7 days.  Provided Duoneb in office today and Kenalog 40 MG, educated her on this plan of care and medications.  No red flags on exam.  Will determine next step after imaging returns, but have advised her to immediately go to ER if any worsening symptoms present.  She agrees with this plan.      Relevant Medications   triamcinolone  acetonide (KENALOG-40) injection 40 mg   ipratropium-albuterol  (DUONEB) 0.5-2.5 (3) MG/3ML nebulizer solution 3 mL   Other Relevant Orders   DG Chest 2 View   CBC with Differential/Platelet   Comprehensive metabolic panel with GFR     Follow up plan: Return in about 1 week (around 08/11/2023) for Cough and SOB.

## 2023-08-04 NOTE — Assessment & Plan Note (Signed)
Previously followed by [facility] with psychiatry -- at this time continue current medication regimen as prescribed by them.

## 2023-08-04 NOTE — Assessment & Plan Note (Signed)
 Ongoing for a little over 2 weeks now with worsening she reports.  Will obtain STAT imaging of chest, discussed with her and she will obtain after this visit.  Start Augmentin  BID for 7 days.  Provided Duoneb in office today and Kenalog 40 MG, educated her on this plan of care and medications.  No red flags on exam.  Will determine next step after imaging returns, but have advised her to immediately go to ER if any worsening symptoms present.  She agrees with this plan.

## 2023-08-04 NOTE — Assessment & Plan Note (Signed)
 Refer to depression plan of care.

## 2023-08-04 NOTE — Assessment & Plan Note (Signed)
 Chronic and ongoing due to thyroid  cancer and removal on 04/27/22.  Will continue current Levothyroxine  dosing and adjust as needed.  Recent thyroid  labs in normal range.

## 2023-08-04 NOTE — Assessment & Plan Note (Signed)
 BMI 51.91, cannot get injectables due to cost.  In past she lost significant weight with Wegovy , which offered benefit to her chronic pain.  Continue Topamax and Metformin  500 MG BID to see if benefit to weight loss. Educated her on this.  Recommended eating smaller high protein, low fat meals more frequently and exercising 30 mins a day 5 times a week with a goal of 10-15lb weight loss in the next 3 months. Patient voiced their understanding and motivation to adhere to these recommendations.

## 2023-08-04 NOTE — Assessment & Plan Note (Signed)
Chronic, ongoing, currently followed by psychiatry -- [facility].  Denies SI/HI.  Continue current medication regimen as ordered by psychiatry and attempt to get notes next visit.  Discussed at length with her today.

## 2023-08-05 ENCOUNTER — Encounter: Payer: Self-pay | Admitting: Nurse Practitioner

## 2023-08-05 ENCOUNTER — Encounter (HOSPITAL_COMMUNITY): Payer: Self-pay

## 2023-08-05 LAB — CBC WITH DIFFERENTIAL/PLATELET
Basophils Absolute: 0.1 10*3/uL (ref 0.0–0.2)
Basos: 1 %
EOS (ABSOLUTE): 0.3 10*3/uL (ref 0.0–0.4)
Eos: 3 %
Hematocrit: 38.5 % (ref 34.0–46.6)
Hemoglobin: 12.4 g/dL (ref 11.1–15.9)
Immature Grans (Abs): 0 10*3/uL (ref 0.0–0.1)
Immature Granulocytes: 0 %
Lymphocytes Absolute: 2.5 10*3/uL (ref 0.7–3.1)
Lymphs: 25 %
MCH: 26.5 pg — ABNORMAL LOW (ref 26.6–33.0)
MCHC: 32.2 g/dL (ref 31.5–35.7)
MCV: 82 fL (ref 79–97)
Monocytes Absolute: 0.6 10*3/uL (ref 0.1–0.9)
Monocytes: 6 %
Neutrophils Absolute: 6.3 10*3/uL (ref 1.4–7.0)
Neutrophils: 65 %
Platelets: 409 10*3/uL (ref 150–450)
RBC: 4.68 x10E6/uL (ref 3.77–5.28)
RDW: 14.3 % (ref 11.7–15.4)
WBC: 9.9 10*3/uL (ref 3.4–10.8)

## 2023-08-05 LAB — COMPREHENSIVE METABOLIC PANEL WITH GFR
ALT: 18 IU/L (ref 0–32)
AST: 16 IU/L (ref 0–40)
Albumin: 4.4 g/dL (ref 3.8–4.9)
Alkaline Phosphatase: 127 IU/L — ABNORMAL HIGH (ref 44–121)
BUN/Creatinine Ratio: 13 (ref 9–23)
BUN: 11 mg/dL (ref 6–24)
Bilirubin Total: 0.3 mg/dL (ref 0.0–1.2)
CO2: 21 mmol/L (ref 20–29)
Calcium: 8.9 mg/dL (ref 8.7–10.2)
Chloride: 97 mmol/L (ref 96–106)
Creatinine, Ser: 0.82 mg/dL (ref 0.57–1.00)
Globulin, Total: 2.9 g/dL (ref 1.5–4.5)
Glucose: 104 mg/dL — ABNORMAL HIGH (ref 70–99)
Potassium: 4.1 mmol/L (ref 3.5–5.2)
Sodium: 135 mmol/L (ref 134–144)
Total Protein: 7.3 g/dL (ref 6.0–8.5)
eGFR: 84 mL/min/{1.73_m2} (ref >59)

## 2023-08-05 NOTE — Progress Notes (Signed)
 Contacted via MyChart   Good morning Cathy Mitchell, labs overall look good with no concern on these for major infection.  Ensure to get your chest x-ray.  Any questions? Keep being stellar!!  Thank you for allowing me to participate in your care.  I appreciate you. Kindest regards, Aylanie Cubillos

## 2023-08-07 NOTE — Patient Instructions (Signed)
 Shortness of Breath, Adult Shortness of breath means you have trouble breathing. Shortness of breath could be a sign of a medical problem. Follow these instructions at home:  Pollution Do not smoke or use any products that contain nicotine or tobacco. If you need help quitting, ask your doctor. Avoid things that can make it harder to breathe, such as: Smoke of all kinds. This includes smoke from campfires or forest fires. Do not smoke or allow others to smoke in your home. Mold. Dust. Air pollution. Chemical smells. Things that can give you an allergic reaction (allergens) if you have allergies. Keep your living space clean. Use products that help remove mold and dust. General instructions Watch for any changes in your symptoms. Take over-the-counter and prescription medicines only as told by your doctor. This includes oxygen therapy and inhaled medicines. Rest as needed. Return to your normal activities when your doctor says that it is safe. Keep all follow-up visits. Contact a doctor if: Your condition does not get better as soon as expected. You have a hard time doing your normal activities, even after you rest. You have new symptoms. You cannot walk up stairs. You cannot exercise the way you normally do. Get help right away if: Your shortness of breath gets worse. You have trouble breathing when you are resting. You feel light-headed or you faint. You have a cough that is not helped by medicines. You cough up blood. You have pain with breathing. You have pain in your chest, arms, shoulders, or belly (abdomen). You have a fever. These symptoms may be an emergency. Get help right away. Call 911. Do not wait to see if the symptoms will go away. Do not drive yourself to the hospital. Summary Shortness of breath is when you have trouble breathing enough air. It can be a sign of a medical problem. Avoid things that make it hard for you to breathe, such as smoking, pollution,  mold, and dust. Watch for any changes in your symptoms. Contact your doctor if you do not get better or you get worse. This information is not intended to replace advice given to you by your health care provider. Make sure you discuss any questions you have with your health care provider. Document Revised: 11/02/2020 Document Reviewed: 11/02/2020 Elsevier Patient Education  2024 [employer].

## 2023-08-09 ENCOUNTER — Encounter: Payer: Self-pay | Admitting: Nurse Practitioner

## 2023-08-12 ENCOUNTER — Encounter: Payer: Self-pay | Admitting: Nurse Practitioner

## 2023-08-12 ENCOUNTER — Ambulatory Visit (INDEPENDENT_AMBULATORY_CARE_PROVIDER_SITE_OTHER): Admitting: Nurse Practitioner

## 2023-08-12 ENCOUNTER — Ambulatory Visit: Payer: Self-pay | Admitting: Nurse Practitioner

## 2023-08-12 VITALS — BP 139/82 | HR 68 | Temp 97.6°F | Ht 68.0 in | Wt 331.4 lb

## 2023-08-12 DIAGNOSIS — R0602 Shortness of breath: Secondary | ICD-10-CM

## 2023-08-12 MED ORDER — FLUTICASONE FUROATE-VILANTEROL 100-25 MCG/ACT IN AEPB
1.0000 | INHALATION_SPRAY | Freq: Every day | RESPIRATORY_TRACT | 11 refills | Status: AC
Start: 2023-08-12 — End: ?

## 2023-08-12 MED ORDER — LEVOCETIRIZINE DIHYDROCHLORIDE 5 MG PO TABS: 5.0000 mg | ORAL_TABLET | Freq: Every evening | ORAL | 1 refills | Status: AC

## 2023-08-12 MED ORDER — ALBUTEROL SULFATE HFA 108 (90 BASE) MCG/ACT IN AERS: 2.0000 | INHALATION_SPRAY | Freq: Four times a day (QID) | RESPIRATORY_TRACT | 0 refills | Status: DC | PRN

## 2023-08-12 MED ORDER — FLUTICASONE PROPIONATE 50 MCG/ACT NA SUSP: 2.0000 | Freq: Every day | NASAL | 6 refills | Status: AC

## 2023-08-12 NOTE — Progress Notes (Signed)
 BP 139/82   Pulse 68   Temp 97.6 F (36.4 C) (Oral)   Ht 5\' 8"  (1.727 m)   Wt (!) 331 lb 6.4 oz (150.3 kg)   LMP 02/09/2017 (Within Weeks)   SpO2 95%   BMI 50.39 kg/m    Subjective:    Patient ID: Cathy Mitchell, female    DOB: 22-Nov-1965, 58 y.o.   MRN: [id number]  HPI: Cathy Mitchell is a 58 y.o. female  Chief Complaint  Patient presents with   Shortness of Breath    1 week f/up   SHORTNESS OF BREATH Follow-up today for SOB which was ongoing on 08/04/23 and treated with Duoneb and Augmentin .  Imaging was performed and this showed no acute findings. Initially seen for this on 07/28/23 and provided a Medrol  dose pack and Albuterol . Covid tests at home have been negative. Has had shortness of breath since around 07/21/23, only had SOB and dizziness at the time. Takes Claritin and this did not help, switched to Zyrtec  and this did not help.  Currently not taking any allergy medication. Denies chest pain with her SOB.    Today she reports continuing to have a hard time breathing.  Reports the Augmentin  helped some, has completed this.  Is using Albuterol  inhaler often which helps for short period.  Has history of asthma when younger.  When lying flat on back her SOB is better, when moving gets winded quickly.  Doing simple tasks is challenging.  Has some discomfort on occasion with shortness of breath. Dizziness if gets a little too active.  Last saw cardiology on 04/26/23, no changes made. Duoneb in office did help with her SOB.   Recurrent headaches: no Visual changes: no Palpitations: occasionally speeding up Dyspnea: yes Chest pain: no Lower extremity edema: no Dizzy/lightheaded: no   Relevant past medical, surgical, family and social history reviewed and updated as indicated. Interim medical history since our last visit reviewed. Allergies and medications reviewed and updated.  Review of Systems  Constitutional:  Positive for fatigue. Negative for activity change, appetite  change, diaphoresis and fever.  HENT:  Negative for congestion, postnasal drip, rhinorrhea, sinus pressure, sinus pain, sore throat and trouble swallowing.   Respiratory:  Positive for cough (occasional) and shortness of breath. Negative for chest tightness and wheezing.   Cardiovascular:  Negative for chest pain, palpitations and leg swelling.  Gastrointestinal: Negative.   Neurological:  Positive for dizziness (if coughs too much). Negative for tremors, weakness, light-headedness and headaches.  Psychiatric/Behavioral: Negative.      Per HPI unless specifically indicated above     Objective:    BP 139/82   Pulse 68   Temp 97.6 F (36.4 C) (Oral)   Ht 5\' 8"  (1.727 m)   Wt (!) 331 lb 6.4 oz (150.3 kg)   LMP 02/09/2017 (Within Weeks)   SpO2 95%   BMI 50.39 kg/m   Wt Readings from Last 3 Encounters:  08/12/23 (!) 331 lb 6.4 oz (150.3 kg)  08/04/23 (!) 341 lb 6.4 oz (154.9 kg)  07/28/23 (!) 341 lb 9.6 oz (154.9 kg)    Physical Exam Vitals and nursing note reviewed.  Constitutional:      General: She is awake. She is not in acute distress.    Appearance: She is well-developed and well-groomed. She is obese. She is not ill-appearing or toxic-appearing.  HENT:     Head: Normocephalic.     Right Ear: Hearing and external ear normal.  Left Ear: Hearing and external ear normal.  Eyes:     General: Lids are normal.        Right eye: No discharge.        Left eye: No discharge.     Conjunctiva/sclera: Conjunctivae normal.     Pupils: Pupils are equal, round, and reactive to light.  Neck:     Thyroid : No thyromegaly.     Vascular: No carotid bruit.  Cardiovascular:     Rate and Rhythm: Normal rate and regular rhythm.     Heart sounds: Normal heart sounds. No murmur heard.    No gallop.  Pulmonary:     Effort: Pulmonary effort is normal. No accessory muscle usage or respiratory distress.     Breath sounds: Normal breath sounds. No decreased breath sounds, wheezing or rales.      Comments: Lung sounds improved today, clear throughout.  No frequent coughing today.  Hoarseness remains. Abdominal:     General: Bowel sounds are normal. There is no distension.     Palpations: Abdomen is soft.     Tenderness: There is no abdominal tenderness.  Musculoskeletal:     Cervical back: Normal range of motion and neck supple.     Right lower leg: No edema.     Left lower leg: No edema.  Lymphadenopathy:     Cervical: No cervical adenopathy.  Skin:    General: Skin is warm and dry.  Neurological:     Mental Status: She is alert and oriented to person, place, and time.     Deep Tendon Reflexes: Reflexes are normal and symmetric.     Reflex Scores:      Brachioradialis reflexes are 2+ on the right side and 2+ on the left side.      Patellar reflexes are 2+ on the right side and 2+ on the left side. Psychiatric:        Attention and Perception: Attention normal.        Mood and Affect: Mood normal.        Speech: Speech normal.        Behavior: Behavior normal. Behavior is cooperative.        Thought Content: Thought content normal.     Results for orders placed or performed in visit on 08/04/23  CBC with Differential/Platelet   Collection Time: 08/04/23  2:28 PM  Result Value Ref Range   WBC 9.9 3.4 - 10.8 x10E3/uL   RBC 4.68 3.77 - 5.28 x10E6/uL   Hemoglobin 12.4 11.1 - 15.9 g/dL   Hematocrit 16.1 09.6 - 46.6 %   MCV 82 79 - 97 fL   MCH 26.5 (L) 26.6 - 33.0 pg   MCHC 32.2 31.5 - 35.7 g/dL   RDW 04.5 40.9 - 81.1 %   Platelets 409 150 - 450 x10E3/uL   Neutrophils 65 Not Estab. %   Lymphs 25 Not Estab. %   Monocytes 6 Not Estab. %   Eos 3 Not Estab. %   Basos 1 Not Estab. %   Neutrophils Absolute 6.3 1.4 - 7.0 x10E3/uL   Lymphocytes Absolute 2.5 0.7 - 3.1 x10E3/uL   Monocytes Absolute 0.6 0.1 - 0.9 x10E3/uL   EOS (ABSOLUTE) 0.3 0.0 - 0.4 x10E3/uL   Basophils Absolute 0.1 0.0 - 0.2 x10E3/uL   Immature Granulocytes 0 Not Estab. %   Immature Grans (Abs) 0.0  0.0 - 0.1 x10E3/uL  Comprehensive metabolic panel with GFR   Collection Time: 08/04/23  2:28 PM  Result  Value Ref Range   Glucose 104 (H) 70 - 99 mg/dL   BUN 11 6 - 24 mg/dL   Creatinine, Ser 2.13 0.57 - 1.00 mg/dL   eGFR 84 >08 MV/HQI/6.96   BUN/Creatinine Ratio 13 9 - 23   Sodium 135 134 - 144 mmol/L   Potassium 4.1 3.5 - 5.2 mmol/L   Chloride 97 96 - 106 mmol/L   CO2 21 20 - 29 mmol/L   Calcium 8.9 8.7 - 10.2 mg/dL   Total Protein 7.3 6.0 - 8.5 g/dL   Albumin 4.4 3.8 - 4.9 g/dL   Globulin, Total 2.9 1.5 - 4.5 g/dL   Bilirubin Total 0.3 0.0 - 1.2 mg/dL   Alkaline Phosphatase 127 (H) 44 - 121 IU/L   AST 16 0 - 40 IU/L   ALT 18 0 - 32 IU/L      Assessment & Plan:   Problem List Items Addressed This Visit       Other   Shortness of breath - Primary   Ongoing with some improvement on exam today noted.  Suspect this may be asthma related, history of asthma in past. Pollen counts may be triggering this.   - Continue Albuterol  as needed. - Start Breo, if covered and if not will send another LABA/ICS, to see if benefit from daily inhaler.  Educated her on this.  She is aware to rinse mouth well after use and not to swallow the rinse water, spit this out.  Discussed with her that goal is to reduce Albuterol  use and improve SOB.   - Would benefit modest weight loss, took Ozempic /Wegovy  with benefit in past but this is no longer covered.   - Sent in Xyzal and Flonase  to use, has not tried these and they may offer benefit to allergies. - Plan on spirometry next visit.  Consider pulmonary referral if ongoing.         Follow up plan: Return in about 2 weeks (around 08/26/2023) for SOB with spirometry.

## 2023-08-12 NOTE — Assessment & Plan Note (Addendum)
 Ongoing with some improvement on exam today noted.  Suspect this may be asthma related, history of asthma in past. Pollen counts may be triggering this.   - Continue Albuterol  as needed. - Start Breo, if covered and if not will send another LABA/ICS, to see if benefit from daily inhaler.  Educated her on this.  She is aware to rinse mouth well after use and not to swallow the rinse water, spit this out.  Discussed with her that goal is to reduce Albuterol  use and improve SOB.   - Would benefit modest weight loss, took Ozempic /Wegovy  with benefit in past but this is no longer covered.   - Sent in Xyzal and Flonase  to use, has not tried these and they may offer benefit to allergies. - Plan on spirometry next visit.  Consider pulmonary referral if ongoing.

## 2023-08-28 NOTE — Patient Instructions (Incomplete)
 Please call to schedule your mammogram and/or bone density: [facility] at [facility]  Address: [address], [place], [place] [address] Phone: [phone]  [facility] at [facility] [address] [place],  [place]  [address] Phone: [phone]    Asthma, Adult  Asthma is a condition that causes swelling and narrowing of the airways. These are the passages that lead from the nose and mouth down into the lungs. When asthma symptoms get worse it is called an asthma attack or flare. This can make it hard to breathe. Asthma flares can range from minor to life-threatening. There is no cure for asthma, but medicines and lifestyle changes can help to control it. What are the causes? It is not known exactly what causes asthma, but certain things can cause asthma symptoms to get worse (triggers). What can trigger an asthma attack? Cigarette smoke. Mold. Dust. Your pet's skin flakes (dander). Cockroaches. Pollen. Air pollution (like household cleaners, wood smoke, smog, or Therapist, occupational). What are the signs or symptoms? Trouble breathing (shortness of breath). Coughing. Making high-pitched whistling sounds when you breathe, most often when you breathe out (wheezing). Chest tightness. Tiredness with little activity. Poor exercise tolerance. How is this treated? Controller medicines that help prevent asthma symptoms. Fast-acting reliever or rescue medicines. These give short-term relief of asthma symptoms. Allergy medicines if your attacks are brought on by allergens. Medicines to help control the body's defense (immune) system. Staying away from the things that cause asthma attacks. Follow these instructions at home: Avoiding triggers in your home Do not allow anyone to smoke in your home. Limit use of fireplaces and wood stoves. Get rid of pests (such as roaches and mice) and their droppings. Keep your home clean. Clean your  floors. Dust regularly. Use cleaning products that do not smell. Wash bed sheets and blankets every week in hot water. Dry them in a dryer. Have someone vacuum when you are not home. Change your heating and air conditioning filters often. Use blankets that are made of polyester or cotton. General instructions Take over-the-counter and prescription medicines only as told by your doctor. Do not smoke or use any products that contain nicotine or tobacco. If you need help quitting, ask your doctor. Stay away from secondhand smoke. Avoid doing things outdoors when allergen counts are high and when air quality is low. Warm up before you exercise. Take time to cool down after exercise. Use a peak flow meter as told by your doctor. A peak flow meter is a tool that measures how well your lungs are working. Keep track of the peak flow meter's readings. Write them down. Follow your asthma action plan. This is a written plan for taking care of your asthma and treating your attacks. Make sure you get all the shots (vaccines) that your doctor recommends. Ask your doctor about a flu shot and a pneumonia shot. Keep all follow-up visits. Contact a doctor if: You have wheezing, shortness of breath, or a cough even while taking medicine to prevent attacks. The mucus you cough up (sputum) is thicker than usual. The mucus you cough up changes from clear or white to yellow, green, gray, or is bloody. You have problems from the medicine you are taking, such as: A rash. Itching. Swelling. Trouble breathing. You need reliever medicines more than 2-3 times a week. Your peak flow reading is still at 50-79% of your personal best after following the action plan for 1 hour. You  have a fever. Get help right away if: You seem to be worse and are not responding to medicine during an asthma attack. You are short of breath even at rest. You get short of breath when doing very little activity. You have trouble eating,  drinking, or talking. You have chest pain or tightness. You have a fast heartbeat. Your lips or fingernails start to turn blue. You are light-headed or dizzy, or you faint. Your peak flow is less than 50% of your personal best. You feel too tired to breathe normally. These symptoms may be an emergency. Get help right away. Call 911. Do not wait to see if the symptoms will go away. Do not drive yourself to the hospital. Summary Asthma is a long-term (chronic) condition in which the airways get tight and narrow. An asthma attack can make it hard to breathe. Asthma cannot be cured, but medicines and lifestyle changes can help control it. Make sure you understand how to avoid triggers and how and when to use your medicines. Avoid things that can cause allergy symptoms (allergens). These include animal skin flakes (dander) and pollen from trees or grass. Avoid things that pollute the air. These may include household cleaners, wood smoke, smog, or chemical odors. This information is not intended to replace advice given to you by your health care provider. Make sure you discuss any questions you have with your health care provider. Document Revised: 12/23/2020 Document Reviewed: 12/23/2020 Elsevier Patient Education  2024 [employer].

## 2023-08-30 ENCOUNTER — Ambulatory Visit (INDEPENDENT_AMBULATORY_CARE_PROVIDER_SITE_OTHER): Admitting: Urology

## 2023-08-30 VITALS — BP 122/80 | HR 85 | Ht 68.25 in | Wt 325.5 lb

## 2023-08-30 DIAGNOSIS — N39492 Postural (urinary) incontinence: Secondary | ICD-10-CM | POA: Diagnosis not present

## 2023-08-30 DIAGNOSIS — N3941 Urge incontinence: Secondary | ICD-10-CM | POA: Diagnosis not present

## 2023-08-30 NOTE — Progress Notes (Signed)
 08/30/2023 1:52 PM   Cathy Mitchell 09/29/1965 [id number]  Referring provider: Lemar Pyles, NP [address] [place],  [place] [address]  Chief Complaint  Patient presents with   Urinary Incontinence    HPI: SN: Patient has medical comorbidities, a sling placed in 2009, pulling in her groin and urinary incontinence.  Residual was 0 mL   I was consulted to assess the patient's urinary incontinence.  Sometimes she has urge incontinence during the day but does not wear a pad.  She has no stress incontinence with coughing sneezing bending or lifting.  She has no bedwetting but can have foot on the floor syndrome but this is not every night.  Because of this she will wear a pad for confidence.  She thinks her sling is no longer working   She time voids every 1 hour to try to stay dry and cannot hold it for 2 hours.  She says she drinks a lot of fluid.  She voids every 2 hours at night and I believe has some ankle edema.  She reports a good flow   She has had a previous sling.  She has not had a hysterectomy.     On pelvic examination she had a very well supported bladder neck. I cannot feel or see any mesh. No prolapse. No stress incontinence.    The patient has mild urge incontinence especially at night likely associated with a nocturnal diuresis. She is concerned about her sling. She has frequency but drinks a lot of fluids. Role of medication and safety cystoscopy discussed. Role of urodynamics discussed. Patient understood the role of urodynamics but we will hold off them for now. She will come back on Gemtesa  samples and prescription for cystoscopy in about 6 weeks    Patient was significantly better on Gemtesa  but insurance did not want to pay for it.  She leaked a few times.  Frequency stable and clinically not infected Cystoscopy: Patient underwent flexible cystoscopy.  Bladder mucosa and trigone were normal.  No foreign body in bladder or urethra.  No cystitis No stress  incontinence with significant cough after cystoscopy    Reevaluate in 6 weeks on oxybutynin  ER 10 mg 3 x 11. Try to find medication that works is affordable   Today Urgency incontinence dramatically better.  Frequency stable.  No infections.  Very pleased    PMH: Past Medical History:  Diagnosis Date   Anxiety    Arthritis    Back pain    Dementia (HCC)    Depression    Fibromyalgia    Food allergy    [place]   Hypertension    no longer has hypertension   Joint pain    Kidney problem    Neuromuscular disorder (HCC)    L-sided tremors   Osteoarthritis    Other fatigue    Rheumatoid arthritis (HCC)    Sleep apnea    does not use C-PAP    Surgical History: Past Surgical History:  Procedure Laterality Date   CARPAL TUNNEL RELEASE Bilateral 2007   DILATION AND CURETTAGE OF UTERUS     GANGLION CYST EXCISION Bilateral    GANGLION CYST EXCISION Right 10/08/2015   Procedure: REMOVAL GANGLION OF WRIST;  Surgeon: Molli Angelucci, MD;  Location: [facility] ORS;  Service: Orthopedics;  Laterality: Right;   GASTRIC BYPASS     HAND SURGERY Bilateral Resection   HIP SURGERY     HYSTEROSCOPY WITH D & C N/A 05/24/2017  Procedure: DILATATION AND CURETTAGE /HYSTEROSCOPY;  Surgeon: Defrancesco, India Mandes, MD;  Location: [facility] ORS;  Service: Gynecology;  Laterality: N/A;   INCONTINENCE SURGERY  2009   LEEP N/A 05/24/2017   Procedure: LOOP ELECTROSURGICAL EXCISION PROCEDURE (LEEP);  Surgeon: Colan Dash, MD;  Location: [facility] ORS;  Service: Gynecology;  Laterality: N/A;   TUBAL LIGATION      Home Medications:  Allergies as of 08/30/2023       Reactions   Oxycodone-acetaminophen  Itching, Other (See Comments)   Reaction:  Dizziness and blurred vision    Other Other (See Comments)   Patient states she is allergic to all narcotics   Percocet [oxycodone-acetaminophen ] Nausea And Vomiting, Other (See Comments)   Reaction:  Dizziness and blurred vision  Reaction:  Dizziness and blurred  vision         Medication List        Accurate as of August 30, 2023  1:52 PM. If you have any questions, ask your nurse or doctor.          acetaminophen  500 MG tablet Commonly known as: TYLENOL  Take 1,500 mg by mouth daily as needed for mild pain.   albuterol  108 (90 Base) MCG/ACT inhaler Commonly known as: VENTOLIN  HFA Inhale 2 puffs into the lungs every 6 (six) hours as needed for wheezing or shortness of breath.   ARIPiprazole  5 MG tablet Commonly known as: Abilify  Take 1 tablet (5 mg total) by mouth daily.   Calcium High Potency/Vitamin D  600-5 MG-MCG Tabs Generic drug: Calcium Carb-Cholecalciferol  Take by mouth.   celecoxib  100 MG capsule Commonly known as: CELEBREX  TAKE 1 CAPSULE (100 MG TOTAL) BY MOUTH DAILY AS NEEDED FOR MODERATE PAIN.   citalopram  40 MG tablet Commonly known as: CELEXA  Take 1 tablet (40 mg total) by mouth daily.   clonazePAM 0.5 MG tablet Commonly known as: KLONOPIN Take 0.5 mg by mouth 2 (two) times daily as needed for anxiety.   diclofenac  Sodium 1 % Gel Commonly known as: VOLTAREN  Apply 2 g topically 4 (four) times daily.   dicyclomine  20 MG tablet Commonly known as: BENTYL  Take 1 tablet (20 mg total) by mouth 3 (three) times daily. What changed:  when to take this reasons to take this   diltiazem  120 MG tablet Commonly known as: CARDIZEM  Take 1 tablet (120 mg total) by mouth 3 (three) times daily.   fluticasone  50 MCG/ACT nasal spray Commonly known as: FLONASE  Place 2 sprays into both nostrils daily.   fluticasone  furoate-vilanterol 100-25 MCG/ACT Aepb Commonly known as: BREO ELLIPTA  Inhale 1 puff into the lungs daily.   gabapentin  300 MG capsule Commonly known as: NEURONTIN  Take 1 capsule (300 mg total) by mouth 3 (three) times daily.   levocetirizine 5 MG tablet Commonly known as: XYZAL  Take 1 tablet (5 mg total) by mouth every evening.   levothyroxine  175 MCG tablet Commonly known as: SYNTHROID  Take 1 tablet  (175 mcg total) by mouth daily before breakfast.   MAGNESIUM PO Take by mouth.   meclizine  12.5 MG tablet Commonly known as: ANTIVERT  Take 1 tablet (12.5 mg total) by mouth 3 (three) times daily as needed for dizziness.   metFORMIN  500 MG tablet Commonly known as: GLUCOPHAGE  Take 1 tablet (500 mg total) by mouth 2 (two) times daily with a meal.   mometasone 0.1 % lotion Commonly known as: ELOCON 2 (two) times daily.   oxybutynin  10 MG 24 hr tablet Commonly known as: DITROPAN -XL Take 1 tablet (10 mg total) by mouth  daily.   topiramate 200 MG tablet Commonly known as: TOPAMAX Take 200 mg by mouth at bedtime.   Vitamin D  (Ergocalciferol ) 1.25 MG (50000 UNIT) Caps capsule Commonly known as: DRISDOL  TAKE 1 CAPSULE BY MOUTH ONE TIME PER WEEK        Allergies:  Allergies  Allergen Reactions   Oxycodone-Acetaminophen  Itching and Other (See Comments)    Reaction:  Dizziness and blurred vision    Other Other (See Comments)    Patient states she is allergic to all narcotics   Percocet [Oxycodone-Acetaminophen ] Nausea And Vomiting and Other (See Comments)    Reaction:  Dizziness and blurred vision  Reaction:  Dizziness and blurred vision     Family History: Family History  Problem Relation Age of Onset   Hypertension Mother    Depression Mother    Anxiety disorder Mother    Obesity Mother    COPD Father    Hypertension Father    Depression Father    Anxiety disorder Father    Obesity Father    Thyroid  disease Daughter    Heart disease Maternal Grandfather    Diabetes Paternal Grandfather    Heart disease Paternal Grandfather    Eczema Son    Breast cancer Neg Hx     Social History:  reports that she quit smoking about 8 years ago. Her smoking use included cigarettes. She started smoking about 37 years ago. She has a 14.5 pack-year smoking history. She has been exposed to tobacco smoke. She has never used smokeless tobacco. She reports that she does not drink  alcohol and does not use drugs.  ROS:                                        Physical Exam: LMP 02/09/2017 (Within Weeks)   Constitutional:  Alert and oriented, No acute distress. HEENT: [place] AT, moist mucus membranes.  Trachea midline, no masses.   Laboratory Data: Lab Results  Component Value Date   WBC 9.9 08/04/2023   HGB 12.4 08/04/2023   HCT 38.5 08/04/2023   MCV 82 08/04/2023   PLT 409 08/04/2023    Lab Results  Component Value Date   CREATININE 0.82 08/04/2023    No results found for: "PSA"  No results found for: "TESTOSTERONE"  Lab Results  Component Value Date   HGBA1C 5.3 05/03/2023    Urinalysis    Component Value Date/Time   COLORURINE YELLOW 02/02/2023 1313   APPEARANCEUR Clear 06/14/2023 1342   LABSPEC 1.015 02/02/2023 1313   PHURINE 7.0 02/02/2023 1313   GLUCOSEU Negative 06/14/2023 1342   HGBUR NEGATIVE 02/02/2023 1313   BILIRUBINUR Negative 06/14/2023 1342   KETONESUR NEGATIVE 02/02/2023 1313   PROTEINUR Negative 06/14/2023 1342   PROTEINUR NEGATIVE 02/02/2023 1313   NITRITE Negative 06/14/2023 1342   NITRITE NEGATIVE 02/02/2023 1313   LEUKOCYTESUR Negative 06/14/2023 1342   LEUKOCYTESUR NEGATIVE 02/02/2023 1313    Pertinent Imaging:   Assessment & Plan: Reassess 6 months  1. Postural urinary incontinence (Primary)  - Urinalysis, Complete   No follow-ups on file.  Devorah Fonder, MD  [facility] [address] [place], [place] [address] [phone]

## 2023-08-31 ENCOUNTER — Encounter: Payer: Self-pay | Admitting: Nurse Practitioner

## 2023-08-31 ENCOUNTER — Ambulatory Visit (INDEPENDENT_AMBULATORY_CARE_PROVIDER_SITE_OTHER): Admitting: Nurse Practitioner

## 2023-08-31 VITALS — BP 113/74 | HR 84 | Temp 98.7°F | Ht 68.2 in | Wt 324.8 lb

## 2023-08-31 DIAGNOSIS — R0602 Shortness of breath: Secondary | ICD-10-CM

## 2023-08-31 DIAGNOSIS — Z1231 Encounter for screening mammogram for malignant neoplasm of breast: Secondary | ICD-10-CM

## 2023-08-31 DIAGNOSIS — J4520 Mild intermittent asthma, uncomplicated: Secondary | ICD-10-CM

## 2023-08-31 NOTE — Addendum Note (Signed)
 Encounter addended by: Tacy Expose, RT on: 08/31/2023 4:53 PM  Actions taken: Imaging Exam ended

## 2023-08-31 NOTE — Assessment & Plan Note (Signed)
 Improving at this time with [facility] on board, no recent use of Albuterol  and SOB improved + cough.  Will continue Breo daily for maintenance and Albuterol  as needed only.  Spirometry today reassuring, normal levels.

## 2023-08-31 NOTE — Progress Notes (Signed)
 BP 113/74   Pulse 84   Temp 98.7 F (37.1 C) (Oral)   Ht 5' 8.2" (1.732 m)   Wt (!) 324 lb 12.8 oz (147.3 kg)   LMP 02/09/2017 (Within Weeks)   SpO2 97%   BMI 49.10 kg/m    Subjective:    Patient ID: Cathy Mitchell, female    DOB: 1966/02/13, 58 y.o.   MRN: [id number]  HPI: Cathy Mitchell is a 58 y.o. female  Chief Complaint  Patient presents with   Asthma   ASTHMA Started on Breo inhaler at visit 08/12/23 due to ongoing SOB and history of asthma as child. She notices inhaler is helping with SOB.  Has not had to use Albuterol . Asthma status: improving Satisfied with current treatment?: yes Albuterol /rescue inhaler frequency: none Dyspnea frequency: a tiny little bit, but not a whole lot Wheezing frequency: none Cough frequency: a little bit, about twice a day Nocturnal symptom frequency: none Limitation of activity: no Current upper respiratory symptoms: no Triggers: pollen Home peak flows:none Last Spirometry: 08/31/23 Failed/intolerant to following asthma meds: none Asthma meds in past: unknown Aerochamber/spacer use: no Visits to ER or Urgent Care in past year: no Pneumovax: Not up to Date Influenza: Up to Date   Relevant past medical, surgical, family and social history reviewed and updated as indicated. Interim medical history since our last visit reviewed. Allergies and medications reviewed and updated.  Review of Systems  Constitutional:  Negative for activity change, appetite change, diaphoresis, fatigue and fever.  Respiratory:  Negative for cough, chest tightness, shortness of breath and wheezing.   Cardiovascular:  Negative for chest pain, palpitations and leg swelling.  Gastrointestinal: Negative.   Neurological: Negative.   Psychiatric/Behavioral: Negative.      Per HPI unless specifically indicated above     Objective:     BP 113/74   Pulse 84   Temp 98.7 F (37.1 C) (Oral)   Ht 5' 8.2" (1.732 m)   Wt (!) 324 lb 12.8 oz (147.3 kg)   LMP  02/09/2017 (Within Weeks)   SpO2 97%   BMI 49.10 kg/m   Wt Readings from Last 3 Encounters:  08/31/23 (!) 324 lb 12.8 oz (147.3 kg)  08/30/23 (!) 325 lb 8 oz (147.6 kg)  08/12/23 (!) 331 lb 6.4 oz (150.3 kg)    Physical Exam Vitals and nursing note reviewed.  Constitutional:      General: She is awake. She is not in acute distress.    Appearance: She is well-developed and well-groomed. She is obese. She is not ill-appearing or toxic-appearing.  HENT:     Head: Normocephalic.     Right Ear: Hearing, tympanic membrane, ear canal and external ear normal.     Left Ear: Hearing, tympanic membrane, ear canal and external ear normal.  Eyes:     General: Lids are normal.        Right eye: No discharge.        Left eye: No discharge.     Conjunctiva/sclera: Conjunctivae normal.     Pupils: Pupils are equal, round, and reactive to light.  Neck:     Thyroid : No thyromegaly.     Vascular: No carotid bruit.  Cardiovascular:     Rate and Rhythm: Normal rate and regular rhythm.     Heart sounds: Normal heart sounds. No murmur heard.    No gallop.  Pulmonary:     Effort: Pulmonary effort is normal. No accessory muscle usage or respiratory distress.  Breath sounds: Normal breath sounds. No decreased breath sounds, wheezing or rales.     Comments: No SOB noted on exam today. Abdominal:     General: Bowel sounds are normal. There is no distension.     Palpations: Abdomen is soft.     Tenderness: There is no abdominal tenderness.  Musculoskeletal:     Cervical back: Normal range of motion and neck supple.     Right lower leg: No edema.     Left lower leg: No edema.  Lymphadenopathy:     Cervical: No cervical adenopathy.  Skin:    General: Skin is warm and dry.  Neurological:     Mental Status: She is alert and oriented to person, place, and time.     Deep Tendon Reflexes: Reflexes are normal and symmetric.     Reflex Scores:      Brachioradialis reflexes are 2+ on the right side and  2+ on the left side.      Patellar reflexes are 2+ on the right side and 2+ on the left side. Psychiatric:        Attention and Perception: Attention normal.        Mood and Affect: Mood normal.        Speech: Speech normal.        Behavior: Behavior normal. Behavior is cooperative.        Thought Content: Thought content normal.     Results for orders placed or performed in visit on 08/04/23  CBC with Differential/Platelet   Collection Time: 08/04/23  2:28 PM  Result Value Ref Range   WBC 9.9 3.4 - 10.8 x10E3/uL   RBC 4.68 3.77 - 5.28 x10E6/uL   Hemoglobin 12.4 11.1 - 15.9 g/dL   Hematocrit 16.1 09.6 - 46.6 %   MCV 82 79 - 97 fL   MCH 26.5 (L) 26.6 - 33.0 pg   MCHC 32.2 31.5 - 35.7 g/dL   RDW 04.5 40.9 - 81.1 %   Platelets 409 150 - 450 x10E3/uL   Neutrophils 65 Not Estab. %   Lymphs 25 Not Estab. %   Monocytes 6 Not Estab. %   Eos 3 Not Estab. %   Basos 1 Not Estab. %   Neutrophils Absolute 6.3 1.4 - 7.0 x10E3/uL   Lymphocytes Absolute 2.5 0.7 - 3.1 x10E3/uL   Monocytes Absolute 0.6 0.1 - 0.9 x10E3/uL   EOS (ABSOLUTE) 0.3 0.0 - 0.4 x10E3/uL   Basophils Absolute 0.1 0.0 - 0.2 x10E3/uL   Immature Granulocytes 0 Not Estab. %   Immature Grans (Abs) 0.0 0.0 - 0.1 x10E3/uL  Comprehensive metabolic panel with GFR   Collection Time: 08/04/23  2:28 PM  Result Value Ref Range   Glucose 104 (H) 70 - 99 mg/dL   BUN 11 6 - 24 mg/dL   Creatinine, Ser 9.14 0.57 - 1.00 mg/dL   eGFR 84 >78 GN/FAO/1.30   BUN/Creatinine Ratio 13 9 - 23   Sodium 135 134 - 144 mmol/L   Potassium 4.1 3.5 - 5.2 mmol/L   Chloride 97 96 - 106 mmol/L   CO2 21 20 - 29 mmol/L   Calcium 8.9 8.7 - 10.2 mg/dL   Total Protein 7.3 6.0 - 8.5 g/dL   Albumin 4.4 3.8 - 4.9 g/dL   Globulin, Total 2.9 1.5 - 4.5 g/dL   Bilirubin Total 0.3 0.0 - 1.2 mg/dL   Alkaline Phosphatase 127 (H) 44 - 121 IU/L   AST 16 0 - 40 IU/L  ALT 18 0 - 32 IU/L      Assessment & Plan:   Problem List Items Addressed This Visit        Respiratory   Asthma - Primary   Improving at this time with Breo on board, no recent use of Albuterol  and SOB improved + cough.  Will continue Breo daily for maintenance and Albuterol  as needed only.  Spirometry today reassuring, normal levels.        Relevant Orders   Spirometry with Graph (Completed)   Other Visit Diagnoses       Encounter for screening mammogram for malignant neoplasm of breast       Mammogram ordered and instructed how to schedule.   Relevant Orders   MM 3D SCREENING MAMMOGRAM BILATERAL BREAST        Follow up plan: Return in about 4 months (around 12/20/2023) for ANXIETY, Depression, Hypothyroid, ASTHMA.

## 2023-09-20 ENCOUNTER — Other Ambulatory Visit: Payer: Self-pay | Admitting: Nurse Practitioner

## 2023-09-21 NOTE — Telephone Encounter (Signed)
 Requested medication (s) are due for refill today: yes   Requested medication (s) are on the active medication list: yes   Last refill:  08/12/23 #18 g 0 refills   Future visit scheduled: yes 01/04/24  Notes to clinic:  no refills listed . How many refills can patient get ?     Requested Prescriptions  Pending Prescriptions Disp Refills   albuterol  (VENTOLIN  HFA) 108 (90 Base) MCG/ACT inhaler [Pharmacy Med Name: ALBUTEROL  HFA (VENTOLIN ) INH] 18 each     Sig: TAKE 2 PUFFS BY MOUTH EVERY 6 HOURS AS NEEDED FOR WHEEZE OR SHORTNESS OF BREATH     Pulmonology:  [facility] Agonists 2 Passed - 09/21/2023  3:00 PM      Passed - Last BP in normal range    BP Readings from Last 1 Encounters:  08/31/23 113/74         Passed - Last Heart Rate in normal range    Pulse Readings from Last 1 Encounters:  08/31/23 84         Passed - Valid encounter within last 12 months    Recent Outpatient Visits           3 weeks ago Mild intermittent asthma without complication   [place] [facility] [place], Melanie T, NP   1 month ago Shortness of breath   [place] [facility] [place], [place]berry T, NP   1 month ago Shortness of breath   [place] [facility] [place], [place] T, NP   1 month ago Shortness of breath   [place] [facility] [place], Darice, NP   2 months ago Acute otitis media, bilateral   [place] [facility] [place] Darice, NP       Future Appointments             In 5 months MacDiarmid, [place], MD [facility]

## 2023-10-27 DIAGNOSIS — F32A Depression, unspecified: Secondary | ICD-10-CM | POA: Diagnosis not present

## 2023-10-27 DIAGNOSIS — F952 Tourette's disorder: Secondary | ICD-10-CM | POA: Diagnosis not present

## 2023-10-27 DIAGNOSIS — M797 Fibromyalgia: Secondary | ICD-10-CM | POA: Diagnosis not present

## 2023-10-27 DIAGNOSIS — R41840 Attention and concentration deficit: Secondary | ICD-10-CM | POA: Diagnosis not present

## 2023-10-27 DIAGNOSIS — G2581 Restless legs syndrome: Secondary | ICD-10-CM | POA: Diagnosis not present

## 2023-10-27 DIAGNOSIS — F411 Generalized anxiety disorder: Secondary | ICD-10-CM | POA: Diagnosis not present

## 2023-10-28 ENCOUNTER — Ambulatory Visit
Admission: RE | Admit: 2023-10-28 | Discharge: 2023-10-28 | Disposition: A | Discharge status: Home / Self Care | Source: Ambulatory Visit | Attending: Nurse Practitioner | Admitting: Nurse Practitioner

## 2023-10-28 ENCOUNTER — Ambulatory Visit (INDEPENDENT_AMBULATORY_CARE_PROVIDER_SITE_OTHER): Admitting: Nurse Practitioner

## 2023-10-28 VITALS — BP 141/84 | HR 85 | Temp 97.9°F | Ht 68.2 in | Wt 312.4 lb

## 2023-10-28 DIAGNOSIS — M545 Low back pain, unspecified: Secondary | ICD-10-CM | POA: Diagnosis not present

## 2023-10-28 DIAGNOSIS — M5136 Other intervertebral disc degeneration, lumbar region with discogenic back pain only: Secondary | ICD-10-CM | POA: Diagnosis not present

## 2023-10-28 DIAGNOSIS — G8929 Other chronic pain: Secondary | ICD-10-CM | POA: Insufficient documentation

## 2023-10-28 DIAGNOSIS — M4316 Spondylolisthesis, lumbar region: Secondary | ICD-10-CM | POA: Diagnosis not present

## 2023-10-28 DIAGNOSIS — M5127 Other intervertebral disc displacement, lumbosacral region: Secondary | ICD-10-CM | POA: Diagnosis not present

## 2023-10-28 DIAGNOSIS — R55 Syncope and collapse: Secondary | ICD-10-CM | POA: Diagnosis not present

## 2023-10-28 DIAGNOSIS — M48061 Spinal stenosis, lumbar region without neurogenic claudication: Secondary | ICD-10-CM | POA: Diagnosis not present

## 2023-10-28 NOTE — Progress Notes (Signed)
 BP (!) 141/84   Pulse 85   Temp 97.9 F (36.6 C) (Oral)   Ht 5' 8.2 (1.732 m)   Wt (!) 312 lb 6.4 oz (141.7 kg)   LMP 02/09/2017 (Within Weeks)   SpO2 96%   BMI 47.22 kg/m    Subjective:    Patient ID: Cathy Mitchell, female    DOB: [birth year], 58 y.o.   MRN: [id number]  HPI: Cathy Mitchell is a 58 y.o. female  Chief Complaint  Patient presents with   Back Pain    Patient states she has had 2 episodes of very bad lower back pain in the last month. States she woke up both times and could not move or move her legs. States when she was able to get up, she was hunched over and had to be helped to the bathroom. States she has had to use a walker on occasion.    Fall    Patient states she had a fall yesterday after a dizzy spell. Thinks she may have passed out and hit her head on a solid wood table. States she doesn't know what happened at all.    BACK PAIN Duration: 2 months Mechanism of injury: unknown Location: Right and low back Onset: sudden Severity: severe Quality: sharp and stabbing Frequency: constant Radiation: none Aggravating factors: prolonged sitting Alleviating factors:  Status: better Treatments attempted: icy hot, celebrex , tylenol , heat and ice, patches  Relief with NSAIDs?: No NSAIDs Taken Nighttime pain:  no Paresthesias / decreased sensation:  no Bowel / bladder incontinence:  no Fevers:  no Dysuria / urinary frequency:  no  Patient states she fell yesterday.  She did have significant pain since the falls.  States she doesn't know what happened.  She just woke up and didn't have any recollection of what happened.  Doesn't think she was unconscious for very long.  She is eating at the same time every day.  Denies HA, CP, SOB, dizziness, palpitations, visual changes, and lower extremity swelling.   Relevant past medical, surgical, family and social history reviewed and updated as indicated. Interim medical history since our last visit  reviewed. Allergies and medications reviewed and updated.  Review of Systems  Musculoskeletal:  Positive for back pain.  Neurological:  Positive for syncope.    Per HPI unless specifically indicated above     Objective:    BP (!) 141/84   Pulse 85   Temp 97.9 F (36.6 C) (Oral)   Ht 5' 8.2 (1.732 m)   Wt (!) 312 lb 6.4 oz (141.7 kg)   LMP 02/09/2017 (Within Weeks)   SpO2 96%   BMI 47.22 kg/m   Wt Readings from Last 3 Encounters:  10/28/23 (!) 312 lb 6.4 oz (141.7 kg)  08/31/23 (!) 324 lb 12.8 oz (147.3 kg)  08/30/23 (!) 325 lb 8 oz (147.6 kg)    Physical Exam Vitals and nursing note reviewed.  Constitutional:      General: She is not in acute distress.    Appearance: Normal appearance. She is obese. She is not ill-appearing, toxic-appearing or diaphoretic.  HENT:     Head: Normocephalic.     Right Ear: External ear normal.     Left Ear: External ear normal.     Nose: Nose normal.     Mouth/Throat:     Mouth: Mucous membranes are moist.     Pharynx: Oropharynx is clear.  Eyes:     General:  Right eye: No discharge.        Left eye: No discharge.     Extraocular Movements: Extraocular movements intact.     Conjunctiva/sclera: Conjunctivae normal.     Pupils: Pupils are equal, round, and reactive to light.  Cardiovascular:     Rate and Rhythm: Normal rate and regular rhythm.     Heart sounds: No murmur heard. Pulmonary:     Effort: Pulmonary effort is normal. No respiratory distress.     Breath sounds: Normal breath sounds. No wheezing or rales.  Musculoskeletal:     Cervical back: Normal range of motion and neck supple.  Skin:    General: Skin is warm and dry.     Capillary Refill: Capillary refill takes less than 2 seconds.  Neurological:     General: No focal deficit present.     Mental Status: She is alert and oriented to person, place, and time. Mental status is at baseline.  Psychiatric:        Mood and Affect: Mood normal.        Behavior:  Behavior normal.        Thought Content: Thought content normal.        Judgment: Judgment normal.     Results for orders placed or performed in visit on 08/04/23  CBC with Differential/Platelet   Collection Time: 08/04/23  2:28 PM  Result Value Ref Range   WBC 9.9 3.4 - 10.8 x10E3/uL   RBC 4.68 3.77 - 5.28 x10E6/uL   Hemoglobin 12.4 11.1 - 15.9 g/dL   Hematocrit 61.4 65.9 - 46.6 %   MCV 82 79 - 97 fL   MCH 26.5 (L) 26.6 - 33.0 pg   MCHC 32.2 31.5 - 35.7 g/dL   RDW 85.6 88.2 - 84.5 %   Platelets 409 150 - 450 x10E3/uL   Neutrophils 65 Not Estab. %   Lymphs 25 Not Estab. %   Monocytes 6 Not Estab. %   Eos 3 Not Estab. %   Basos 1 Not Estab. %   Neutrophils Absolute 6.3 1.4 - 7.0 x10E3/uL   Lymphocytes Absolute 2.5 0.7 - 3.1 x10E3/uL   Monocytes Absolute 0.6 0.1 - 0.9 x10E3/uL   EOS (ABSOLUTE) 0.3 0.0 - 0.4 x10E3/uL   Basophils Absolute 0.1 0.0 - 0.2 x10E3/uL   Immature Granulocytes 0 Not Estab. %   Immature Grans (Abs) 0.0 0.0 - 0.1 x10E3/uL  Comprehensive metabolic panel with GFR   Collection Time: 08/04/23  2:28 PM  Result Value Ref Range   Glucose 104 (H) 70 - 99 mg/dL   BUN 11 6 - 24 mg/dL   Creatinine, Ser 9.17 0.57 - 1.00 mg/dL   eGFR 84 >40 fO/fpw/8.26   BUN/Creatinine Ratio 13 9 - 23   Sodium 135 134 - 144 mmol/L   Potassium 4.1 3.5 - 5.2 mmol/L   Chloride 97 96 - 106 mmol/L   CO2 21 20 - 29 mmol/L   Calcium 8.9 8.7 - 10.2 mg/dL   Total Protein 7.3 6.0 - 8.5 g/dL   Albumin 4.4 3.8 - 4.9 g/dL   Globulin, Total 2.9 1.5 - 4.5 g/dL   Bilirubin Total 0.3 0.0 - 1.2 mg/dL   Alkaline Phosphatase 127 (H) 44 - 121 IU/L   AST 16 0 - 40 IU/L   ALT 18 0 - 32 IU/L      Assessment & Plan:   Problem List Items Addressed This Visit   None Visit Diagnoses  Syncope, unspecified syncope type    -  Primary   EKG stable.  No acute concerns. Suspect vasovagal symptoms.  Continue to eat regular meals and water intake.   Relevant Orders   EKG 12-Lead     Chronic  right-sided low back pain without sciatica       Has tried conservative management. Will restart Gabapentin  and obtain xray for evaluation. If not improved, may need PT.   Relevant Orders   DG Lumbar Spine Complete        Follow up plan: No follow-ups on file.

## 2023-10-29 ENCOUNTER — Ambulatory Visit: Payer: Self-pay | Admitting: Nurse Practitioner

## 2023-10-29 ENCOUNTER — Encounter: Payer: Self-pay | Admitting: Nurse Practitioner

## 2023-10-29 DIAGNOSIS — M545 Low back pain, unspecified: Secondary | ICD-10-CM

## 2023-10-31 ENCOUNTER — Encounter: Payer: Self-pay | Admitting: Nurse Practitioner

## 2023-11-01 ENCOUNTER — Ambulatory Visit: Payer: Self-pay

## 2023-11-01 NOTE — Telephone Encounter (Signed)
  FYI Only or Action Required?: FYI only for provider.  Patient was last seen in primary care on 10/28/2023 by Melvin Pao, NP.  Called Nurse Triage reporting Hypertension.  Symptoms began several days ago.  Interventions attempted: Prescription medications: BP meds.  Symptoms are: unchanged.  Triage Disposition: No disposition on file.  Patient/caregiver understands and will follow disposition?: yes           Copied from CRM [id number]. Topic: Clinical - Red Word Triage >> Nov 01, 2023  8:08 AM Cathy Mitchell wrote: Red Word that prompted transfer to Nurse Triage: Blood pressure high 145 & higher will not settle down Reason for Disposition  [1] MILD-MODERATE headache AND [2] present > 3 days (72 hours) AND [3] no improvement after using Care Advice  Answer Assessment - Initial Assessment Questions 1. BLOOD PRESSURE: What is your blood pressure? Did you take at least two measurements 5 minutes apart?     BP unavailable 2. ONSET: When did you take your blood pressure?     In doctor's office cannot find cuff  3. HOW: How did you take your blood pressure? (e.g., automatic home BP monitor, visiting nurse)     OV 4. HISTORY: Do you have a history of high blood pressure?     yes 5. MEDICINES: Are you taking any medicines for blood pressure? Have you missed any doses recently?     Yes/no 6. OTHER SYMPTOMS: Do you have any symptoms? (e.g., blurred vision, chest pain, difficulty breathing, headache, weakness)    Head feels like a tiki torch, lightheaded  Answer Assessment - Initial Assessment Questions 1. LOCATION: Where does it hurt?      head 2. ONSET: When did the headache start? (e.g., minutes, hours, days)      *No Answer* 3. PATTERN: Does the pain come and go, or has it been constant since it started?     *No Answer* 4. SEVERITY: How bad is the pain? and What does it keep you from doing?  (e.g., Scale 1-10; mild, moderate, or severe)      mod 6. CAUSE: What do you think is causing the headache?     HTN 9. OTHER SYMPTOMS: Do you have any other symptoms? (e.g., fever, stiff neck, eye pain, sore throat, cold symptoms)     Occ. lightheadedness  Protocols used: Blood Pressure - High-A-AH, Headache-A-AH  Answer Assessment - Initial Assessment Questions 1. BLOOD PRESSURE: What is your blood pressure? Did you take at least two measurements 5 minutes apart?     BP  2. ONSET: When did you take your blood pressure?     In doctor's office cannot find cuff  3. HOW: How did you take your blood pressure? (e.g., automatic home BP monitor, visiting nurse)     OV 4. HISTORY: Do you have a history of high blood pressure?     yes 5. MEDICINES: Are you taking any medicines for blood pressure? Have you missed any doses recently?     Yes/no 6. OTHER SYMPTOMS: Do you have any symptoms? (e.g., blurred vision, chest pain, difficulty breathing, headache, weakness)    Head feels like a tiki torch, lightheaded  Protocols used: Blood Pressure - High-A-AH

## 2023-11-02 ENCOUNTER — Ambulatory Visit: Attending: Pediatrics

## 2023-11-02 ENCOUNTER — Ambulatory Visit: Payer: Self-pay | Admitting: Pediatrics

## 2023-11-02 ENCOUNTER — Ambulatory Visit (INDEPENDENT_AMBULATORY_CARE_PROVIDER_SITE_OTHER): Admitting: Pediatrics

## 2023-11-02 VITALS — BP 112/74 | HR 73 | Temp 97.6°F | Wt 308.2 lb

## 2023-11-02 DIAGNOSIS — R519 Headache, unspecified: Secondary | ICD-10-CM | POA: Diagnosis not present

## 2023-11-02 DIAGNOSIS — G8929 Other chronic pain: Secondary | ICD-10-CM

## 2023-11-02 DIAGNOSIS — R55 Syncope and collapse: Secondary | ICD-10-CM

## 2023-11-02 DIAGNOSIS — M545 Low back pain, unspecified: Secondary | ICD-10-CM

## 2023-11-02 DIAGNOSIS — J069 Acute upper respiratory infection, unspecified: Secondary | ICD-10-CM | POA: Diagnosis not present

## 2023-11-02 LAB — POC COVID19/FLU A&B COMBO
Covid Antigen, POC: NEGATIVE
Influenza A Antigen, POC: NEGATIVE
Influenza B Antigen, POC: NEGATIVE

## 2023-11-02 MED ORDER — PREGABALIN 25 MG PO CAPS: 25.0000 mg | ORAL_CAPSULE | Freq: Two times a day (BID) | ORAL | 1 refills | Status: DC

## 2023-11-02 NOTE — Progress Notes (Signed)
 Office Visit  BP 112/74   Pulse 73   Temp 97.6 F (36.4 C) (Oral)   Wt (!) 308 lb 3.2 oz (139.8 kg)   LMP 02/09/2017 (Within Weeks)   SpO2 95%   BMI 46.59 kg/m    Subjective:    Patient ID: Cathy Mitchell, female    DOB: 04-02-1965, 58 y.o.   MRN: [id number]  HPI: Cathy Mitchell is a 58 y.o. female  Chief Complaint  Patient presents with   Headache    Discussed the use of AI scribe software for clinical note transcription with the patient, who gave verbal consent to proceed.  History of Present Illness   Cathy Mitchell is a 58 year old female with hypertension who presents with episodes of dizziness, fainting, and elevated blood pressure.  She experiences episodes of dizziness and fainting, accompanied by elevated blood pressure. Her blood pressure is typically around 106 mmHg, but she reported it was 145 mmHg when she came in for her recent visit. She describes a sensation of her head feeling like an 'internal tiki torch' with profuse sweating, particularly in her head, and a feeling of pressure rising from her neck upwards. No numbness or tingling is noted unless related to sleeping positions.  She experienced a fainting episode where she stood up, felt dizzy and nauseous, and then passed out, waking up on the floor with rug burns on her knees. Since then, she feels lightheaded and dizzy, with a persistent concern about passing out again. She denies dehydration, stating she is an 'over drinker' and always has a drink by her side.  She has a history of chronic ear infections and reports waking up with a sore throat, questioning if these symptoms are related to her current condition. She also mentions having a 'down slope' on her EKG and is on diltiazem , which she describes as making her heartbeat 'the right direction' and that without it, her heart beats 'bottom to top.'  She was previously on gabapentin  for lower back pain due to a grade one disc slippage but discontinued it due  to adverse effects, including numbness and tingling. She is currently not on gabapentin  and is unsure about alternative treatments for her back pain.  She has no history of anemia and denies any symptoms of a UTI, although she notes she often does not feel typical symptoms of infections like ear infections or UTIs.        Relevant past medical, surgical, family and social history reviewed and updated as indicated. Interim medical history since our last visit reviewed. Allergies and medications reviewed and updated.  ROS per HPI unless specifically indicated above     Objective:    BP 112/74   Pulse 73   Temp 97.6 F (36.4 C) (Oral)   Wt (!) 308 lb 3.2 oz (139.8 kg)   LMP 02/09/2017 (Within Weeks)   SpO2 95%   BMI 46.59 kg/m   Wt Readings from Last 3 Encounters:  11/02/23 (!) 308 lb 3.2 oz (139.8 kg)  10/28/23 (!) 312 lb 6.4 oz (141.7 kg)  08/31/23 (!) 324 lb 12.8 oz (147.3 kg)     Physical Exam Constitutional:      Appearance: Normal appearance.  HENT:     Mouth/Throat:     Pharynx: Oropharynx is clear. No oropharyngeal exudate or posterior oropharyngeal erythema.  Cardiovascular:     Rate and Rhythm: Normal rate and regular rhythm.  Pulmonary:     Effort: Pulmonary effort is normal.  Breath sounds: Normal breath sounds.  Musculoskeletal:        General: Normal range of motion.  Skin:    Comments: Normal skin color  Neurological:     General: No focal deficit present.     Mental Status: She is alert. Mental status is at baseline.  Psychiatric:        Mood and Affect: Mood normal.        Behavior: Behavior normal.        Thought Content: Thought content normal.         11/02/2023    8:47 AM 07/28/2023    1:25 PM 05/03/2023   10:36 AM 01/29/2023   11:11 AM 11/24/2022    8:22 AM  Depression screen PHQ 2/9  Decreased Interest 0 0 0 0 0  Down, Depressed, Hopeless 0 0 2 0 0  PHQ - 2 Score 0 0 2 0 0  Altered sleeping 2 3 2 3 2   Tired, decreased energy 0 0 1 0 1   Change in appetite 0 3 1 0 2  Feeling bad or failure about yourself  0 0 3 0 0  Trouble concentrating 0 0 3 0 1  Moving slowly or fidgety/restless 0 0 0 3 0  Suicidal thoughts 0 0 0 0 0  PHQ-9 Score 2 6 12 6 6   Difficult doing work/chores Not difficult at all Not difficult at all Somewhat difficult  Not difficult at all       11/02/2023    8:48 AM 07/28/2023    1:25 PM 05/03/2023   10:37 AM 01/29/2023   11:10 AM  GAD 7 : Generalized Anxiety Score  Nervous, Anxious, on Edge 0 0 2 0  Control/stop worrying 0 0 1 0  Worry too much - different things 0 0 1 0  Trouble relaxing 0 0 1 0  Restless 0 3 3 0  Easily annoyed or irritable 0 0 0 0  Afraid - awful might happen 0 0 0 0  Total GAD 7 Score 0 3 8 0  Anxiety Difficulty Not difficult at all Not difficult at all Somewhat difficult        Assessment & Plan:  Assessment & Plan   Syncope, unspecified syncope type Episodic elevated blood pressure with dizziness and syncope suggests possible arrhythmias, electrolyte imbalances, thyroid  dysfunction, or infection. EKG is normal but limited without symptoms. She has no history of anemia or dehydration. She has a cardiac conduction abnormality managed with diltiazem . No history of atrial fibrillation, but a ventricular conduction issue is present. Prolonged bed rest. Order blood work for electrolytes and thyroid  levels. Use a Holter patch for extended cardiac monitoring to rule out arrhythmias. Swab for flu, COVID, and strep to exclude infection. Add a D-dimer test to rule out clot. -     CBC with Differential/Platelet -     Basic metabolic panel with GFR -     Magnesium -     TSH -     LONG TERM MONITOR (3-14 DAYS); Future -     Brain natriuretic peptide -     D-dimer, quantitative   Chronic right-sided low back pain without sciatica Chronic lower back pain is due to lumbar spondylolisthesis. She previously had an adverse reaction to gabapentin . Pain may contribute to elevated blood  pressure. Consider a trial of Lyrica  for back pain management. -     Pregabalin ; Take 1 capsule (25 mg total) by mouth 2 (two) times daily.  Dispense: 60 capsule;  Refill: 1  Upper respiratory tract infection, unspecified type Reporting sore throat. Negative swabs. -     POC Covid19/Flu A&B Antigen -     Rapid Strep Screen ([facility] ONLY) -     Culture, Group A Strep    Follow up plan: Return in about 2 weeks (around 11/16/2023).  Hadassah SHAUNNA Nett, MD

## 2023-11-02 NOTE — Patient Instructions (Addendum)
 For your back pain: we can try lyrica  25mg  twice daily. Do not take with gabapentin   They will send the holter monitor straight to your home  Will call you with results

## 2023-11-03 LAB — BASIC METABOLIC PANEL WITH GFR
BUN/Creatinine Ratio: 11 (ref 9–23)
BUN: 10 mg/dL (ref 6–24)
CO2: 21 mmol/L (ref 20–29)
Calcium: 9.3 mg/dL (ref 8.7–10.2)
Chloride: 99 mmol/L (ref 96–106)
Creatinine, Ser: 0.89 mg/dL (ref 0.57–1.00)
Glucose: 94 mg/dL (ref 70–99)
Potassium: 4.4 mmol/L (ref 3.5–5.2)
Sodium: 136 mmol/L (ref 134–144)
eGFR: 76 mL/min/1.73 (ref >59)

## 2023-11-03 LAB — CBC WITH DIFFERENTIAL/PLATELET
Basophils Absolute: 0.1 x10E3/uL (ref 0.0–0.2)
Basos: 2 %
EOS (ABSOLUTE): 0.2 x10E3/uL (ref 0.0–0.4)
Eos: 2 %
Hematocrit: 42.7 % (ref 34.0–46.6)
Hemoglobin: 13.5 g/dL (ref 11.1–15.9)
Immature Grans (Abs): 0 x10E3/uL (ref 0.0–0.1)
Immature Granulocytes: 0 %
Lymphocytes Absolute: 1.6 x10E3/uL (ref 0.7–3.1)
Lymphs: 19 %
MCH: 26.9 pg (ref 26.6–33.0)
MCHC: 31.6 g/dL (ref 31.5–35.7)
MCV: 85 fL (ref 79–97)
Monocytes Absolute: 0.5 x10E3/uL (ref 0.1–0.9)
Monocytes: 7 %
Neutrophils Absolute: 5.8 x10E3/uL (ref 1.4–7.0)
Neutrophils: 70 %
Platelets: 429 x10E3/uL (ref 150–450)
RBC: 5.02 x10E6/uL (ref 3.77–5.28)
RDW: 15.2 % (ref 11.7–15.4)
WBC: 8.3 x10E3/uL (ref 3.4–10.8)

## 2023-11-03 LAB — BRAIN NATRIURETIC PEPTIDE: BNP: 8.2 pg/mL (ref 0.0–100.0)

## 2023-11-03 LAB — D-DIMER, QUANTITATIVE: D-DIMER: 0.49 mg{FEU}/L (ref 0.00–0.49)

## 2023-11-03 LAB — TSH: TSH: 0.769 u[IU]/mL (ref 0.450–4.500)

## 2023-11-03 LAB — MAGNESIUM: Magnesium: 2.2 mg/dL (ref 1.6–2.3)

## 2023-11-05 ENCOUNTER — Encounter: Payer: Self-pay | Admitting: Nurse Practitioner

## 2023-11-05 LAB — RAPID STREP SCREEN (MED CTR MEBANE ONLY): Strep Gp A Ag, IA W/Reflex: NEGATIVE

## 2023-11-05 LAB — CULTURE, GROUP A STREP

## 2023-11-07 ENCOUNTER — Encounter: Payer: Self-pay | Admitting: Pediatrics

## 2023-11-13 NOTE — Patient Instructions (Signed)
Syncope, Adult  Syncope is when you pass out or faint for a short time. It is caused by a sudden decrease in blood flow to the brain. This can happen for many reasons. It can sometimes happen when seeing blood, getting a shot (injection), or having pain or strong emotions. Most causes of fainting are not dangerous, but in some cases it can be a sign of a serious medical problem. If you faint, get help right away. Call your local emergency services (911 in the [place] Follow these instructions at home: Watch for any changes in your symptoms. Take these actions to stay safe and help with your symptoms: Knowing when you may be about to faint Signs that you may be about to faint include: Feeling dizzy or light-headed. It may feel like the room is spinning. Feeling weak. Feeling like you may vomit (nauseous). Seeing spots or seeing all white or all black. Having cold, clammy skin. Feeling warm and sweaty. Hearing ringing in the ears. If you start to feel like you might faint, sit or lie down right away. If sitting, lower your head down between your legs. If lying down, raise (elevate) your feet above the level of your heart. Breathe deeply and steadily. Wait until all of the symptoms are gone. Have someone stay with you until you feel better. Medicines Take over-the-counter and prescription medicines only as told by your doctor. If you are taking blood pressure or heart medicine, sit up and stand up slowly. Spend a few minutes getting ready to sit and then stand. This can help you feel less dizzy. Lifestyle Do not drive, use machinery, or play sports until your doctor says it is okay. Do not drink alcohol. Do not smoke or use any products that contain nicotine or tobacco. If you need help quitting, ask your doctor. Avoid hot tubs and saunas. General instructions Talk with your doctor about your symptoms. You may need to have testing to help find the cause. Drink enough fluid to keep your pee  (urine) pale yellow. Avoid standing for a long time. If you must stand for a long time, do movements such as: Moving your legs. Crossing your legs. Flexing and stretching your leg muscles. Squatting. Keep all follow-up visits. Contact a doctor if: You have episodes of near fainting. Get help right away if: You pass out or faint. You hit your head or are injured after fainting. You have any of these symptoms: Fast or uneven heartbeats (palpitations). Pain in your chest, belly, or back. Shortness of breath. You have jerky movements that you cannot control (seizure). You have a very bad headache. You are confused. You have problems with how you see (vision). You are very weak. You have trouble walking. You are bleeding from your mouth or your butt (rectum). You have black or tarry poop (stool). These symptoms may be an emergency. Get help right away. Call your local emergency services (911 in the [place] Do not wait to see if the symptoms will go away. Do not drive yourself to the hospital. Summary Syncope is when you pass out or faint for a short time. It is caused by a sudden decrease in blood flow to the brain. Signs that you may be about to faint include feeling dizzy or light-headed, feeling like you may vomit, seeing all white or all black, or having cold, clammy skin. If you start to feel like you might faint, sit or lie down right away. Lower your head if sitting, or raise (elevate)   your feet if lying down. Breathe deeply and steadily. Wait until all of the symptoms are gone. This information is not intended to replace advice given to you by your health care provider. Make sure you discuss any questions you have with your health care provider. Document Revised: 07/25/2020 Document Reviewed: 07/25/2020 Elsevier Patient Education  2024 [employer].

## 2023-11-15 NOTE — Addendum Note (Signed)
 Encounter addended by: Rusty Harvey, RT on: 11/15/2023 10:29 AM  Actions taken: Imaging Exam ended

## 2023-11-16 ENCOUNTER — Encounter: Payer: Self-pay | Admitting: Nurse Practitioner

## 2023-11-16 ENCOUNTER — Ambulatory Visit: Admitting: Nurse Practitioner

## 2023-11-16 VITALS — BP 130/76 | HR 73 | Temp 98.2°F | Resp 15 | Ht 68.19 in | Wt 311.2 lb

## 2023-11-16 DIAGNOSIS — R42 Dizziness and giddiness: Secondary | ICD-10-CM | POA: Insufficient documentation

## 2023-11-16 DIAGNOSIS — M797 Fibromyalgia: Secondary | ICD-10-CM

## 2023-11-16 MED ORDER — PREGABALIN 50 MG PO CAPS: 50.0000 mg | ORAL_CAPSULE | Freq: Two times a day (BID) | ORAL | 4 refills | Status: AC

## 2023-11-16 NOTE — Progress Notes (Signed)
 BP 130/76 (BP Location: Right Arm, Patient Position: Sitting, Cuff Size: Large)   Pulse 73   Temp 98.2 F (36.8 C) (Oral)   Resp 15   Ht 5' 8.19 (1.732 m)   Wt (!) 311 lb 3.2 oz (141.2 kg)   LMP 02/09/2017 (Within Weeks)   SpO2 94%   BMI 47.06 kg/m    Subjective:    Patient ID: Cathy Mitchell, female    DOB: [birth year], 58 y.o.   MRN: [id number]  HPI: Cathy Mitchell is a 58 y.o. female  Chief Complaint  Patient presents with    Syncope    Some improvements. Not dizzy, still some off balance.    DIZZINESS Follow-up for dizziness today, was seen on 10/28/23 and 11/02/23.  Is to go to PT for slipped disc and back discomfort, but has not heard from anyone.  Started Lyrica  last visit, this has offered more benefit than Gabapentin .  Taking 25 MG BID.  No further dizziness or light headed feeling.  Her balance remains off, but feels this is improving. Recent lumbar imaging did noted multilevel degenerative disc and Grade 1 anterolisthesis of L4 on L5 + moderate L4-L5 facet hypertrophy. Would like to trial Lyrica  increase.  At baseline has some OAB, follows urology for this.  Last visit 04/12/23.  Takes Ditropan  for this and wears adult diapers at home.  Notices more symptoms when at home. Duration: weeks Nausea: no Vomiting: no Tinnitus: no Hearing loss: no Aural fullness: no Headache: no Photophobia/phonophobia: no Unsteady gait: yes Postural instability: at times Diplopia, dysarthria, dysphagia or weakness: no Related to exertion: no Pallor: no Diaphoresis: no Dyspnea: no Chest pain: no      11/16/2023    9:57 AM 11/02/2023    8:47 AM 07/28/2023    1:25 PM 05/03/2023   10:36 AM 01/29/2023   11:11 AM  Depression screen PHQ 2/9  Decreased Interest 0 0 0 0 0  Down, Depressed, Hopeless 0 0 0 2 0  PHQ - 2 Score 0 0 0 2 0  Altered sleeping 0 2 3 2 3   Tired, decreased energy 1 0 0 1 0  Change in appetite 0 0 3 1 0  Feeling bad or failure about yourself  0 0 0 3 0  Trouble  concentrating 0 0 0 3 0  Moving slowly or fidgety/restless 0 0 0 0 3  Suicidal thoughts 0 0 0 0 0  PHQ-9 Score 1 2 6 12 6   Difficult doing work/chores  Not difficult at all Not difficult at all Somewhat difficult        11/16/2023    9:58 AM 11/02/2023    8:48 AM 07/28/2023    1:25 PM 05/03/2023   10:37 AM  GAD 7 : Generalized Anxiety Score  Nervous, Anxious, on Edge 0 0 0 2  Control/stop worrying 0 0 0 1  Worry too much - different things 0 0 0 1  Trouble relaxing 0 0 0 1  Restless 0 0 3 3  Easily annoyed or irritable 0 0 0 0  Afraid - awful might happen 0 0 0 0  Total GAD 7 Score 0 0 3 8  Anxiety Difficulty  Not difficult at all Not difficult at all Somewhat difficult   Relevant past medical, surgical, family and social history reviewed and updated as indicated. Interim medical history since our last visit reviewed. Allergies and medications reviewed and updated.  Review of Systems  Constitutional:  Negative for activity change,  appetite change, diaphoresis, fatigue and fever.  Respiratory:  Negative for cough, chest tightness and shortness of breath.   Cardiovascular:  Negative for chest pain, palpitations and leg swelling.  Gastrointestinal: Negative.   Neurological:  Negative for dizziness, syncope, weakness, light-headedness, numbness and headaches.  Psychiatric/Behavioral: Negative.      Per HPI unless specifically indicated above     Objective:    BP 130/76 (BP Location: Right Arm, Patient Position: Sitting, Cuff Size: Large)   Pulse 73   Temp 98.2 F (36.8 C) (Oral)   Resp 15   Ht 5' 8.19 (1.732 m)   Wt (!) 311 lb 3.2 oz (141.2 kg)   LMP 02/09/2017 (Within Weeks)   SpO2 94%   BMI 47.06 kg/m   Wt Readings from Last 3 Encounters:  11/16/23 (!) 311 lb 3.2 oz (141.2 kg)  11/02/23 (!) 308 lb 3.2 oz (139.8 kg)  10/28/23 (!) 312 lb 6.4 oz (141.7 kg)    Physical Exam Vitals and nursing note reviewed.  Constitutional:      General: She is awake. She is not in  acute distress.    Appearance: She is well-developed and well-groomed. She is obese. She is not ill-appearing or toxic-appearing.  HENT:     Head: Normocephalic.     Right Ear: Hearing, tympanic membrane, ear canal and external ear normal.     Left Ear: Hearing, tympanic membrane, ear canal and external ear normal.  Eyes:     General: Lids are normal.        Right eye: No discharge.        Left eye: No discharge.     Conjunctiva/sclera: Conjunctivae normal.     Pupils: Pupils are equal, round, and reactive to light.  Neck:     Thyroid : No thyromegaly.     Vascular: No carotid bruit.  Cardiovascular:     Rate and Rhythm: Normal rate and regular rhythm.     Heart sounds: Normal heart sounds. No murmur heard.    No gallop.  Pulmonary:     Effort: Pulmonary effort is normal. No accessory muscle usage or respiratory distress.     Breath sounds: Normal breath sounds. No decreased breath sounds, wheezing or rales.  Abdominal:     General: Bowel sounds are normal. There is no distension.     Palpations: Abdomen is soft.     Tenderness: There is no abdominal tenderness.  Musculoskeletal:     Cervical back: Normal range of motion and neck supple.     Right lower leg: No edema.     Left lower leg: No edema.  Lymphadenopathy:     Cervical: No cervical adenopathy.  Skin:    General: Skin is warm and dry.  Neurological:     Mental Status: She is alert and oriented to person, place, and time.     Deep Tendon Reflexes: Reflexes are normal and symmetric.     Reflex Scores:      Brachioradialis reflexes are 2+ on the right side and 2+ on the left side.      Patellar reflexes are 2+ on the right side and 2+ on the left side. Psychiatric:        Attention and Perception: Attention normal.        Mood and Affect: Mood normal.        Speech: Speech normal.        Behavior: Behavior normal. Behavior is cooperative.        Thought Content: Thought  content normal.     Results for orders  placed or performed in visit on 11/02/23  CBC w/Diff   Collection Time: 11/02/23  9:22 AM  Result Value Ref Range   WBC 8.3 3.4 - 10.8 x10E3/uL   RBC 5.02 3.77 - 5.28 x10E6/uL   Hemoglobin 13.5 11.1 - 15.9 g/dL   Hematocrit 57.2 65.9 - 46.6 %   MCV 85 79 - 97 fL   MCH 26.9 26.6 - 33.0 pg   MCHC 31.6 31.5 - 35.7 g/dL   RDW 84.7 88.2 - 84.5 %   Platelets 429 150 - 450 x10E3/uL   Neutrophils 70 Not Estab. %   Lymphs 19 Not Estab. %   Monocytes 7 Not Estab. %   Eos 2 Not Estab. %   Basos 2 Not Estab. %   Neutrophils Absolute 5.8 1.4 - 7.0 x10E3/uL   Lymphocytes Absolute 1.6 0.7 - 3.1 x10E3/uL   Monocytes Absolute 0.5 0.1 - 0.9 x10E3/uL   EOS (ABSOLUTE) 0.2 0.0 - 0.4 x10E3/uL   Basophils Absolute 0.1 0.0 - 0.2 x10E3/uL   Immature Granulocytes 0 Not Estab. %   Immature Grans (Abs) 0.0 0.0 - 0.1 x10E3/uL  Basic Metabolic Panel (BMET)   Collection Time: 11/02/23  9:22 AM  Result Value Ref Range   Glucose 94 70 - 99 mg/dL   BUN 10 6 - 24 mg/dL   Creatinine, Ser 9.10 0.57 - 1.00 mg/dL   eGFR 76 >40 fO/fpw/8.26   BUN/Creatinine Ratio 11 9 - 23   Sodium 136 134 - 144 mmol/L   Potassium 4.4 3.5 - 5.2 mmol/L   Chloride 99 96 - 106 mmol/L   CO2 21 20 - 29 mmol/L   Calcium 9.3 8.7 - 10.2 mg/dL  Magnesium   Collection Time: 11/02/23  9:22 AM  Result Value Ref Range   Magnesium 2.2 1.6 - 2.3 mg/dL  TSH   Collection Time: 11/02/23  9:22 AM  Result Value Ref Range   TSH 0.769 0.450 - 4.500 uIU/mL  B Nat Peptide   Collection Time: 11/02/23  9:22 AM  Result Value Ref Range   BNP 8.2 0.0 - 100.0 pg/mL  D-Dimer, Quantitative   Collection Time: 11/02/23  9:22 AM  Result Value Ref Range   D-DIMER 0.49 0.00 - 0.49 mg/L FEU  Rapid Strep Screen ([facility] ONLY)   Collection Time: 11/02/23  9:27 AM   Specimen: Other   Other  Result Value Ref Range   Strep Gp A Ag, IA W/Reflex Negative Negative  Culture, Group A Strep   Collection Time: 11/02/23  9:27 AM   Other  Result Value Ref  Range   Strep A Culture Negative   POC Covid19/Flu A&B Antigen   Collection Time: 11/02/23 10:07 AM  Result Value Ref Range   Influenza A Antigen, POC Negative Negative   Influenza B Antigen, POC Negative Negative   Covid Antigen, POC Negative Negative      Assessment & Plan:   Problem List Items Addressed This Visit       Other   Fibromyalgia - Primary (Chronic)   Chronic, ongoing, followed by neurology at this time as needed.  Recent notes and labs reviewed.  Will continue increase Lyrica  to 50 MG BID, as she is finding benefit from this.  Check on PT referral for patient.      Relevant Medications   pregabalin  (LYRICA ) 50 MG capsule   Dizziness   Acute and improved at this time.  Continue  to monitor closely.  Will check on PT referral for patient.        Follow up plan: Return for As scheduled in October.

## 2023-11-16 NOTE — Assessment & Plan Note (Signed)
 Chronic, ongoing, followed by neurology at this time as needed.  Recent notes and labs reviewed.  Will continue increase Lyrica  to 50 MG BID, as she is finding benefit from this.  Check on PT referral for patient.

## 2023-11-16 NOTE — Assessment & Plan Note (Signed)
 Acute and improved at this time.  Continue to monitor closely.  Will check on PT referral for patient.

## 2023-11-17 ENCOUNTER — Encounter: Payer: Self-pay | Admitting: Nurse Practitioner

## 2023-11-23 ENCOUNTER — Encounter: Payer: Self-pay | Admitting: Nurse Practitioner

## 2023-12-06 ENCOUNTER — Ambulatory Visit: Attending: Nurse Practitioner

## 2023-12-06 DIAGNOSIS — G8929 Other chronic pain: Secondary | ICD-10-CM | POA: Diagnosis not present

## 2023-12-06 DIAGNOSIS — R2689 Other abnormalities of gait and mobility: Secondary | ICD-10-CM | POA: Diagnosis not present

## 2023-12-06 DIAGNOSIS — M545 Low back pain, unspecified: Secondary | ICD-10-CM | POA: Diagnosis not present

## 2023-12-06 DIAGNOSIS — M5459 Other low back pain: Secondary | ICD-10-CM | POA: Insufficient documentation

## 2023-12-06 DIAGNOSIS — S39012D Strain of muscle, fascia and tendon of lower back, subsequent encounter: Secondary | ICD-10-CM | POA: Diagnosis not present

## 2023-12-06 NOTE — Therapy (Signed)
 OUTPATIENT PHYSICAL THERAPY THORACOLUMBAR EVALUATION   Patient Name: Cathy Mitchell MRN: [id number] DOB:1966-03-14, 58 y.o., female Today's Date: 12/06/2023  END OF SESSION:  PT End of Session - 12/06/23 1128     Visit Number 1    Number of Visits 13    Date for PT Re-Evaluation 01/17/24    Authorization Type 2x/week x 6 weeks    PT Start Time 0945    PT Stop Time 1035    PT Time Calculation (min) 50 min    Activity Tolerance Patient tolerated treatment well    Behavior During Therapy WFL for tasks assessed/performed          Past Medical History:  Diagnosis Date   Anxiety    Arthritis    Back pain    Dementia (HCC)    Depression    Fibromyalgia    Food allergy    [place]   Hypertension    no longer has hypertension   Joint pain    Kidney problem    Neuromuscular disorder (HCC)    L-sided tremors   Osteoarthritis    Other fatigue    Rheumatoid arthritis (HCC)    Sleep apnea    does not use C-PAP   Past Surgical History:  Procedure Laterality Date   CARPAL TUNNEL RELEASE Bilateral 2007   DILATION AND CURETTAGE OF UTERUS     GANGLION CYST EXCISION Bilateral    GANGLION CYST EXCISION Right 10/08/2015   Procedure: REMOVAL GANGLION OF WRIST;  Surgeon: Ozell Flake, MD;  Location: [facility];  Service: Orthopedics;  Laterality: Right;   GASTRIC BYPASS     HAND SURGERY Bilateral Resection   HIP SURGERY     HYSTEROSCOPY WITH D & C N/A 05/24/2017   Procedure: DILATATION AND CURETTAGE /HYSTEROSCOPY;  Surgeon: Defrancesco, Gladis LABOR, MD;  Location: [facility];  Service: Gynecology;  Laterality: N/A;   INCONTINENCE SURGERY  2009   LEEP N/A 05/24/2017   Procedure: LOOP ELECTROSURGICAL EXCISION PROCEDURE (LEEP);  Surgeon: Kathe Gladis LABOR, MD;  Location: [facility];  Service: Gynecology;  Laterality: N/A;   TUBAL LIGATION     Patient Active Problem List   Diagnosis Date Noted   Dizziness 11/16/2023   Multiple joint pain 01/29/2023   Hypothyroid 07/21/2022   History  of thyroid  cancer 04/14/2022   PVC's (premature ventricular contractions) 12/24/2021   Small fiber neuropathy 09/26/2021   Tourette syndrome 07/14/2021   Attention deficit hyperactivity disorder (ADHD), predominantly inattentive type 03/28/2021   B12 deficiency 02/12/2021   Vitamin D  deficiency 02/12/2021   Mild cognitive impairment 02/12/2021   Functional movement disorder 10/23/2020   Situational anxiety 07/10/2020   Depression 06/11/2020   Vertigo, benign positional 10/25/2019   S/P LEEP 05/24/2017   History of right knee joint replacement 08/04/2016   Osteoarthritis of hip (Right) 08/04/2016   Wheat allergy 02/12/2016   Asthma 11/13/2015   Rosacea 04/29/2015   Obesity 09/11/2014   Obstructive sleep apnea 09/10/2014   Chronic pain syndrome 09/10/2014   Irritable bowel syndrome 09/10/2014   Fibromyalgia 09/10/2014    PCPJolene Cannady, NP  REFERRING PROVIDER: same   REFERRING DIAG: M54.50,G89.29 (ICD-10-CM) - Chronic right-sided low back pain without sciatica   Rationale for Evaluation and Treatment: Rehabilitation  THERAPY DIAG:  Other low back pain  Strain of lumbar region, subsequent encounter  Balance problem  ONSET DATE: July 2025  SUBJECTIVE:  SUBJECTIVE STATEMENT: Pt c/o back pain which she describes mainly as tension L>R; no numbness/tingling in her legs;   Pt reports a few months ago she woke up and could not get up because of her back pain; she did not have a specific injury.  She mainly was bed bound for 2 months before going to see her PCP.  Used her FWW and seat with arm supports to get up/down from toilet.  She tried to do some movement in her bed.  She did not do a lot though.  Had fear of worsening her back    Agg: bending, lifting (food out of oven)  Allev:  Lyrica - has been helping significantly with the the back pain- allowing her to stand and do more around her house; activity pacing/modification  Pt expresses kinesiophobia today-  afraid of over doing it and straining her disc   PERTINENT HISTORY:   h/o fibromyalgia  PMH- no previous h/o back issues; see above for full PMH  Seeing urology for her bladder control (even before her back)- describes it as OAB, also notices some when she has a back spasm; no issues with FI  Has lost 20 pounds with lifestyle changes  PAIN:  Are you having pain? Yes   PRECAUTIONS: Fall  RED FLAGS: None   WEIGHT BEARING RESTRICTIONS: No  FALLS:  Has patient fallen in last 6 months? Yes. Number of falls at least 2-3  LIVING ENVIRONMENT: Lives with: lives with their family and lives with their spouse Lives in: House/apartment Stairs: Yes: External: 4 steps; can reach both Has following equipment at home: None (she has a FWW and toilet seat with rails from previous hx)  OCCUPATION: pt is taking college classes at [employer]- taking writing classes- enjoys writing; enjoys sewing; she does not workout at a gym anymore (since thyroid  surgery); has a chair exercise book- has a small hand weights she can use   PLOF: Independent  PATIENT GOALS: to be able to exercise at home to strengthen her back and do this in a way that does not hurt herself again; to be more steady again on her feet; build strength and reduce fear of movement   NEXT MD VISIT: early October 2025 [name], NP)  OBJECTIVE:  Note: Objective measures were completed at Evaluation unless otherwise noted.  DIAGNOSTIC FINDINGS:  X-ray (per chart review) 10/28/23  IMPRESSION: 1. Multilevel degenerative disc disease, most prominent at L2-L3 and L4-L5. 2. Moderate L4-L5 facet hypertrophy. 3. Grade 1 anterolisthesis of L4 on L5. Trace retrolisthesis of L5 on S1.   PATIENT SURVEYS:  mODI: 17/50  COGNITION: Overall cognitive status:  Within functional limits for tasks assessed     SENSATION: WFL  MUSCLE LENGTH: Deferred  POSTURE: decreased lumbar lordosis  PALPATION: (+) MTP lumbar paraspinal mm  LUMBAR ROM:   AROM eval  Flexion Mod limited  Extension Mod limited  Right lateral flexion Mod limited  Left lateral flexion Mod limited  Right rotation   Left rotation    (Blank rows = not tested)  LOWER EXTREMITY ROM:   will further assess at next session  Active  Right eval Left eval  Hip flexion    Hip extension    Hip abduction    Hip adduction    Hip internal rotation    Hip external rotation    Knee flexion    Knee extension    Ankle dorsiflexion    Ankle plantarflexion    Ankle inversion    Ankle eversion     (  Blank rows = not tested)  LOWER EXTREMITY MMT:    MMT Right eval Left eval  Hip flexion 4 4  Hip extension    Hip abduction    Hip adduction    Hip internal rotation    Hip external rotation    Knee flexion 4 4  Knee extension 4 4  Ankle dorsiflexion 4 4  Ankle plantarflexion    Ankle inversion    Ankle eversion     (Blank rows = not tested)  LUMBAR SPECIAL TESTS:  (-) seated slump test R and L 1+ Patellar reflex b/l; not able to elicit Achilles b/l  FUNCTIONAL TESTS:  5 times sit to stand: 14 seconds today SLS R and L- required UE support, pt reports unable to do without holding wall, feels off balance  GAIT: Distance walked: pt amb in hallway, does have small episode of loss of balance and leaned into the wall while turning directions Assistive device utilized: None Level of assistance: Complete Independence  TREATMENT DATE: 12/06/23                                                                                                                               Initial evaluation performed   PATIENT EDUCATION:  Education details: self care/pt education- discussed PT POC/goals, safety during amb as she is having difficulty with balance Person educated:  Patient Education method: Explanation and Verbal cues Education comprehension: verbalized understanding and needs further education  HOME EXERCISE PROGRAM: To be formally initiated at visit #2  ASSESSMENT:  CLINICAL IMPRESSION: Patient is a 58 y.o. F who was seen today for physical therapy evaluation and treatment for low back pain; also having some difficulty with her balance.   OBJECTIVE IMPAIRMENTS: Abnormal gait, decreased balance, decreased mobility, difficulty walking, decreased ROM, decreased strength, and pain.   ACTIVITY LIMITATIONS: carrying, lifting, bending, standing, squatting, and locomotion level  PARTICIPATION LIMITATIONS: meal prep, cleaning, laundry, community activity, yard work, and school  PERSONAL FACTORS: Past/current experiences and Time since onset of injury/illness/exacerbation are also affecting patient's functional outcome.   REHAB POTENTIAL: Good  CLINICAL DECISION MAKING: Stable/uncomplicated  EVALUATION COMPLEXITY: Low   GOALS: Goals reviewed with patient? Yes  SHORT TERM GOALS: Target date: 12/29/23  Pt will be instructed on a HEP for LE strengthening and balance to perform 3-4x/week Baseline: at visit #2 Goal status: INITIAL    LONG TERM GOALS: Target date: 01/17/24  Improve mODI >10 points indicating pt is significantly less limited in her daily activities by LBP Baseline: 17/50 Goal status: INITIAL  2.  Pt will be able to perform squat/lift from floor to waist with optimal body mechanics to reduce kinesiophobia and improve her ability to perform her housework without being limited by LBP Baseline: unable Goal status: INITIAL  3.  Improve strength 1/2 MMT grade in trunk/LE/lumbopelvic region to improve her ability to perform daily activities at home including housework without being limited by LBP Baseline: 4/5  hip flex Goal status: INITIAL   PLAN:  PT FREQUENCY: 2x/week  PT DURATION: 6 weeks  PLANNED INTERVENTIONS:  [id number]-Therapeutic exercises, 97530- Therapeutic activity, [id number]- Neuromuscular re-education, 97535- Self Care, and [id number]- Manual therapy.  PLAN FOR NEXT SESSION: assess BP at next session as pt has had episodes of syncope in recent past  Vernell Reges, PT, DPT, OCS  Ronya Gilcrest E Jeidi Gilles, PT 12/06/2023, 11:29 AM

## 2023-12-08 ENCOUNTER — Ambulatory Visit

## 2023-12-08 DIAGNOSIS — S39012D Strain of muscle, fascia and tendon of lower back, subsequent encounter: Secondary | ICD-10-CM

## 2023-12-08 DIAGNOSIS — M5459 Other low back pain: Secondary | ICD-10-CM | POA: Diagnosis not present

## 2023-12-08 DIAGNOSIS — R2689 Other abnormalities of gait and mobility: Secondary | ICD-10-CM | POA: Diagnosis not present

## 2023-12-08 DIAGNOSIS — M545 Low back pain, unspecified: Secondary | ICD-10-CM | POA: Diagnosis not present

## 2023-12-08 DIAGNOSIS — G8929 Other chronic pain: Secondary | ICD-10-CM | POA: Diagnosis not present

## 2023-12-08 NOTE — Therapy (Signed)
 OUTPATIENT PHYSICAL THERAPY THORACOLUMBAR TREATMENT   Patient Name: Cathy Mitchell MRN: [id number] DOB:1965/05/15, 58 y.o., female Today's Date: 12/08/2023  END OF SESSION:  PT End of Session - 12/08/23 1725     Visit Number 2    Number of Visits 13    Date for PT Re-Evaluation 01/17/24    Authorization Type 2x/week x 6 weeks    PT Start Time 1545    PT Stop Time 1630    PT Time Calculation (min) 45 min    Activity Tolerance Patient tolerated treatment well    Behavior During Therapy WFL for tasks assessed/performed           Past Medical History:  Diagnosis Date   Anxiety    Arthritis    Back pain    Dementia (HCC)    Depression    Fibromyalgia    Food allergy    [place]   Hypertension    no longer has hypertension   Joint pain    Kidney problem    Neuromuscular disorder (HCC)    L-sided tremors   Osteoarthritis    Other fatigue    Rheumatoid arthritis (HCC)    Sleep apnea    does not use C-PAP   Past Surgical History:  Procedure Laterality Date   CARPAL TUNNEL RELEASE Bilateral 2007   DILATION AND CURETTAGE OF UTERUS     GANGLION CYST EXCISION Bilateral    GANGLION CYST EXCISION Right 10/08/2015   Procedure: REMOVAL GANGLION OF WRIST;  Surgeon: Ozell Flake, MD;  Location: [facility];  Service: Orthopedics;  Laterality: Right;   GASTRIC BYPASS     HAND SURGERY Bilateral Resection   HIP SURGERY     HYSTEROSCOPY WITH D & C N/A 05/24/2017   Procedure: DILATATION AND CURETTAGE /HYSTEROSCOPY;  Surgeon: Defrancesco, Gladis LABOR, MD;  Location: [facility];  Service: Gynecology;  Laterality: N/A;   INCONTINENCE SURGERY  2009   LEEP N/A 05/24/2017   Procedure: LOOP ELECTROSURGICAL EXCISION PROCEDURE (LEEP);  Surgeon: Kathe Gladis LABOR, MD;  Location: [facility];  Service: Gynecology;  Laterality: N/A;   TUBAL LIGATION     Patient Active Problem List   Diagnosis Date Noted   Dizziness 11/16/2023   Multiple joint pain 01/29/2023   Hypothyroid 07/21/2022   History  of thyroid  cancer 04/14/2022   PVC's (premature ventricular contractions) 12/24/2021   Small fiber neuropathy 09/26/2021   Tourette syndrome 07/14/2021   Attention deficit hyperactivity disorder (ADHD), predominantly inattentive type 03/28/2021   B12 deficiency 02/12/2021   Vitamin D  deficiency 02/12/2021   Mild cognitive impairment 02/12/2021   Functional movement disorder 10/23/2020   Situational anxiety 07/10/2020   Depression 06/11/2020   Vertigo, benign positional 10/25/2019   S/P LEEP 05/24/2017   History of right knee joint replacement 08/04/2016   Osteoarthritis of hip (Right) 08/04/2016   Wheat allergy 02/12/2016   Asthma 11/13/2015   Rosacea 04/29/2015   Obesity 09/11/2014   Obstructive sleep apnea 09/10/2014   Chronic pain syndrome 09/10/2014   Irritable bowel syndrome 09/10/2014   Fibromyalgia 09/10/2014    PCPJolene Cannady, NP  REFERRING PROVIDER: same   REFERRING DIAG: M54.50,G89.29 (ICD-10-CM) - Chronic right-sided low back pain without sciatica   Rationale for Evaluation and Treatment: Rehabilitation  THERAPY DIAG:  No diagnosis found.  ONSET DATE: July 2025  SUBJECTIVE:  SUBJECTIVE STATEMENT: Pt c/o back pain which she describes mainly as tension L>R; no numbness/tingling in her legs;   Pt reports a few months ago she woke up and could not get up because of her back pain; she did not have a specific injury.  She mainly was bed bound for 2 months before going to see her PCP.  Used her FWW and seat with arm supports to get up/down from toilet.  She tried to do some movement in her bed.  She did not do a lot though.  Had fear of worsening her back    Agg: bending, lifting (food out of oven)  Allev: Lyrica - has been helping significantly with the the back pain-  allowing her to stand and do more around her house; activity pacing/modification  Pt expresses kinesiophobia today-  afraid of over doing it and straining her disc   PERTINENT HISTORY:   h/o fibromyalgia  PMH- no previous h/o back issues; see above for full PMH  Seeing urology for her bladder control (even before her back)- describes it as OAB, also notices some when she has a back spasm; no issues with FI  Has lost 20 pounds with lifestyle changes  PAIN:  Are you having pain? Yes   PRECAUTIONS: Fall  RED FLAGS: None   WEIGHT BEARING RESTRICTIONS: No  FALLS:  Has patient fallen in last 6 months? Yes. Number of falls at least 2-3  LIVING ENVIRONMENT: Lives with: lives with their family and lives with their spouse Lives in: House/apartment Stairs: Yes: External: 4 steps; can reach both Has following equipment at home: None (she has a FWW and toilet seat with rails from previous hx)  OCCUPATION: pt is taking college classes at [employer]- taking writing classes- enjoys writing; enjoys sewing; she does not workout at a gym anymore (since thyroid  surgery); has a chair exercise book- has a small hand weights she can use   PLOF: Independent  PATIENT GOALS: to be able to exercise at home to strengthen her back and do this in a way that does not hurt herself again; to be more steady again on her feet; build strength and reduce fear of movement   NEXT MD VISIT: early October 2025 [name], NP)  OBJECTIVE:  Note: Objective measures were completed at Evaluation unless otherwise noted.  DIAGNOSTIC FINDINGS:  X-ray (per chart review) 10/28/23  IMPRESSION: 1. Multilevel degenerative disc disease, most prominent at L2-L3 and L4-L5. 2. Moderate L4-L5 facet hypertrophy. 3. Grade 1 anterolisthesis of L4 on L5. Trace retrolisthesis of L5 on S1.   PATIENT SURVEYS:  mODI: 17/50  COGNITION: Overall cognitive status: Within functional limits for tasks  assessed     SENSATION: WFL  MUSCLE LENGTH: Deferred  POSTURE: decreased lumbar lordosis  PALPATION: (+) MTP lumbar paraspinal mm  LUMBAR ROM:   AROM eval  Flexion Mod limited  Extension Mod limited  Right lateral flexion Mod limited  Left lateral flexion Mod limited  Right rotation   Left rotation    (Blank rows = not tested)  LOWER EXTREMITY ROM:   will further assess at next session  Active  Right eval Left eval  Hip flexion    Hip extension    Hip abduction    Hip adduction    Hip internal rotation    Hip external rotation    Knee flexion    Knee extension    Ankle dorsiflexion    Ankle plantarflexion    Ankle inversion    Ankle eversion     (  Blank rows = not tested)  LOWER EXTREMITY MMT:    MMT Right eval Left eval  Hip flexion 4 4  Hip extension    Hip abduction    Hip adduction    Hip internal rotation    Hip external rotation    Knee flexion 4 4  Knee extension 4 4  Ankle dorsiflexion 4 4  Ankle plantarflexion    Ankle inversion    Ankle eversion     (Blank rows = not tested)  LUMBAR SPECIAL TESTS:  (-) seated slump test R and L 1+ Patellar reflex b/l; not able to elicit Achilles b/l  FUNCTIONAL TESTS:  5 times sit to stand: 14 seconds today SLS R and L- required UE support, pt reports unable to do without holding wall, feels off balance  GAIT: Distance walked: pt amb in hallway, does have small episode of loss of balance and leaned into the wall while turning directions Assistive device utilized: None Level of assistance: Complete Independence  TREATMENT DATE: 12/06/23 Subjective: Pt did some squats with the wall supporting her at home.  She did some leg exercises/stretches on her bed.  This all felt fairly comfortable.  She paced herself and took breaks as needed.    Objective:  Therapeutic Exercise: Hip/knee PROM with pt in supine hooklying- SKTC, glute/piriformis stretch Hooklying LTR x 10 ea  Therapeutic  Activities: Standing marches x 15 ea Standing rows with blue TB x 20 Standing shoulder extension with blue TB x 20 Chair squats x 10, 2 sets TA brace with marches x 15 ea- pt instruction for how to perform ADIM   Self care instruction HEP Instruction- how to attach bands, handouts given, reviewed Access Code: [email or web address] URL: [email or web address] Date: 12/08/2023 Prepared by: Vernell Reges  Exercises - Hooklying Sequential Leg March and Lower  - 1 x daily - 7 x weekly - 2 sets - 15 reps - Squat with Chair Touch  - 1 x daily - 7 x weekly - 2 sets - 10 reps - Standing Row with Anchored Resistance  - 1 x daily - 7 x weekly - 2 sets - 15 reps - Shoulder Extension with Resistance  - 1 x daily - 7 x weekly - 2 sets - 15 reps - Standing Hip Flexion March  - 1 x daily - 7 x weekly - 2 sets - 15 reps  PATIENT EDUCATION:  Education details: self care/pt education- discussed PT POC/goals, safety during amb as she is having difficulty with balance Person educated: Patient Education method: Explanation and Verbal cues Education comprehension: verbalized understanding and needs further education  HOME EXERCISE PROGRAM: To be formally initiated at visit #2  ASSESSMENT:  CLINICAL IMPRESSION: Patient is a 58 y.o. F who was seen today for physical therapy treatment for low back pain; also having some difficulty with her balance. Tolerated today's session well; is very motivated to participate in PT. Had no increase in soreness at end of session.  OBJECTIVE IMPAIRMENTS: Abnormal gait, decreased balance, decreased mobility, difficulty walking, decreased ROM, decreased strength, and pain.   ACTIVITY LIMITATIONS: carrying, lifting, bending, standing, squatting, and locomotion level  PARTICIPATION LIMITATIONS: meal prep, cleaning, laundry, community activity, yard work, and school  PERSONAL FACTORS: Past/current experiences and Time since onset of injury/illness/exacerbation are also  affecting patient's functional outcome.   REHAB POTENTIAL: Good  CLINICAL DECISION MAKING: Stable/uncomplicated  EVALUATION COMPLEXITY: Low   GOALS: Goals reviewed with patient? Yes  SHORT TERM GOALS: Target date: 12/29/23  Pt will be  instructed on a HEP for LE strengthening and balance to perform 3-4x/week Baseline: at visit #2 Goal status: INITIAL    LONG TERM GOALS: Target date: 01/17/24  Improve mODI >10 points indicating pt is significantly less limited in her daily activities by LBP Baseline: 17/50 Goal status: INITIAL  2.  Pt will be able to perform squat/lift from floor to waist with optimal body mechanics to reduce kinesiophobia and improve her ability to perform her housework without being limited by LBP Baseline: unable Goal status: INITIAL  3.  Improve strength 1/2 MMT grade in trunk/LE/lumbopelvic region to improve her ability to perform daily activities at home including housework without being limited by LBP Baseline: 4/5 hip flex Goal status: INITIAL   PLAN:  PT FREQUENCY: 2x/week  PT DURATION: 6 weeks  PLANNED INTERVENTIONS: 97110-Therapeutic exercises, 97530- Therapeutic activity, 97112- Neuromuscular re-education, 97535- Self Care, and [id number]- Manual therapy.  PLAN FOR NEXT SESSION: assess BP at next session as pt has had episodes of syncope in recent past  Vernell Reges, PT, DPT, OCS  Amadou Katzenstein E Mick Tanguma, PT 12/08/2023, 5:25 PM

## 2023-12-13 ENCOUNTER — Ambulatory Visit

## 2023-12-15 ENCOUNTER — Ambulatory Visit

## 2023-12-20 ENCOUNTER — Ambulatory Visit

## 2023-12-22 ENCOUNTER — Encounter

## 2023-12-29 ENCOUNTER — Encounter

## 2024-01-01 NOTE — Patient Instructions (Signed)
 Please call to schedule your mammogram and/or bone density: [facility] at [facility]  Address: [address], [place], [place] [address] Phone: [phone]  [facility] [address] [place],  [place]  [address] Phone: [phone]    Healthy Eating, Adult Healthy eating may help you get and keep a healthy body weight, reduce the risk of chronic disease, and live a long and productive life. It is important to follow a healthy eating pattern. Your nutritional and calorie needs should be met mainly by different nutrient-rich foods. What are tips for following this plan? Reading food labels Read labels and choose the following: Reduced or low sodium products. Juices with 100% fruit juice. Foods with low saturated fats (<3 g per serving) and high polyunsaturated and monounsaturated fats. Foods with whole grains, such as whole wheat, cracked wheat, brown rice, and wild rice. Whole grains that are fortified with folic acid. This is recommended for females who are pregnant or who want to become pregnant. Read labels and do not eat or drink the following: Foods or drinks with added sugars. These include foods that contain brown sugar, corn sweetener, corn syrup, dextrose, fructose, glucose, high-fructose corn syrup, honey, invert sugar, lactose, malt syrup, maltose, molasses, raw sugar, sucrose, trehalose, or turbinado sugar. Limit your intake of added sugars to less than 10% of your total daily calories. Do not eat more than the following amounts of added sugar per day: 6 teaspoons (25 g) for females. 9 teaspoons (38 g) for males. Foods that contain processed or refined starches and grains. Refined grain products, such as white flour, degermed cornmeal, white bread, and white rice. Shopping Choose nutrient-rich snacks, such as vegetables, whole fruits, and nuts. Avoid high-calorie and high-sugar snacks, such as potato chips,  fruit snacks, and candy. Use oil-based dressings and spreads on foods instead of solid fats such as butter, margarine, sour cream, or cream cheese. Limit pre-made sauces, mixes, and instant products such as flavored rice, instant noodles, and ready-made pasta. Try more plant-protein sources, such as tofu, tempeh, black beans, edamame, lentils, nuts, and seeds. Explore eating plans such as the Mediterranean diet or vegetarian diet. Try heart-healthy dips made with beans and healthy fats like hummus and guacamole. Vegetables go great with these. Cooking Use oil to saut or stir-fry foods instead of solid fats such as butter, margarine, or lard. Try baking, boiling, grilling, or broiling instead of frying. Remove the fatty part of meats before cooking. Steam vegetables in water or broth. Meal planning  At meals, imagine dividing your plate into fourths: One-half of your plate is fruits and vegetables. One-fourth of your plate is whole grains. One-fourth of your plate is protein, especially lean meats, poultry, eggs, tofu, beans, or nuts. Include low-fat dairy as part of your daily diet. Lifestyle Choose healthy options in all settings, including home, work, school, restaurants, or stores. Prepare your food safely: Wash your hands after handling raw meats. Where you prepare food, keep surfaces clean by regularly washing with hot, soapy water. Keep raw meats separate from ready-to-eat foods, such as fruits and vegetables. Cook seafood, meat, poultry, and eggs to the recommended temperature. Get a food thermometer. Store foods at safe temperatures. In general: Keep cold foods at 43F (4.4C) or below. Keep hot foods at 143F (60C) or above. Keep your freezer at [facility] (-17.8C) or below. Foods are not safe to eat if they have been between the temperatures of 40-143F (4.4-60C) for more  than 2 hours. What foods should I eat? Fruits Aim to eat 1-2 cups of fresh, canned (in natural juice),  or frozen fruits each day. One cup of fruit equals 1 small apple, 1 large banana, 8 large strawberries, 1 cup (237 g) canned fruit,  cup (82 g) dried fruit, or 1 cup (240 mL) 100% juice. Vegetables Aim to eat 2-4 cups of fresh and frozen vegetables each day, including different varieties and colors. One cup of vegetables equals 1 cup (91 g) broccoli or cauliflower florets, 2 medium carrots, 2 cups (150 g) raw, leafy greens, 1 large tomato, 1 large bell pepper, 1 large sweet potato, or 1 medium white potato. Grains Aim to eat 5-10 ounce-equivalents of whole grains each day. Examples of 1 ounce-equivalent of grains include 1 slice of bread, 1 cup (40 g) ready-to-eat cereal, 3 cups (24 g) popcorn, or  cup (93 g) cooked rice. Meats and other proteins Try to eat 5-7 ounce-equivalents of protein each day. Examples of 1 ounce-equivalent of protein include 1 egg,  oz nuts (12 almonds, 24 pistachios, or 7 walnut halves), 1/4 cup (90 g) cooked beans, 6 tablespoons (90 g) hummus or 1 tablespoon (16 g) peanut butter. A cut of meat or fish that is the size of a deck of cards is about 3-4 ounce-equivalents (85 g). Of the protein you eat each week, try to have at least 8 sounce (227 g) of seafood. This is about 2 servings per week. This includes salmon, trout, herring, sardines, and anchovies. Dairy Aim to eat 3 cup-equivalents of fat-free or low-fat dairy each day. Examples of 1 cup-equivalent of dairy include 1 cup (240 mL) milk, 8 ounces (250 g) yogurt, 1 ounces (44 g) natural cheese, or 1 cup (240 mL) fortified soy milk. Fats and oils Aim for about 5 teaspoons (21 g) of fats and oils per day. Choose monounsaturated fats, such as canola and olive oils, mayonnaise made with olive oil or avocado oil, avocados, peanut butter, and most nuts, or polyunsaturated fats, such as sunflower, corn, and soybean oils, walnuts, pine nuts, sesame seeds, sunflower seeds, and flaxseed. Beverages Aim for 6 eight-ounce glasses of  water per day. Limit coffee to 3-5 eight-ounce cups per day. Limit caffeinated beverages that have added calories, such as soda and energy drinks. If you drink alcohol: Limit how much you have to: 0-1 drink a day if you are female. 0-2 drinks a day if you are female. Know how much alcohol is in your drink. In the [place] one drink is one 12 oz bottle of beer (355 mL), one 5 oz glass of wine (148 mL), or one 1 oz glass of hard liquor (44 mL). Seasoning and other foods Try not to add too much salt to your food. Try using herbs and spices instead of salt. Try not to add sugar to food. This information is based on [place] nutrition guidelines. To learn more, visit DisposableNylon.be. Exact amounts may vary. You may need different amounts. This information is not intended to replace advice given to you by your health care provider. Make sure you discuss any questions you have with your health care provider. Document Revised: 12/15/2021 Document Reviewed: 12/15/2021 Elsevier Patient Education  2024 [employer].

## 2024-01-03 ENCOUNTER — Encounter

## 2024-01-04 ENCOUNTER — Encounter: Payer: Self-pay | Admitting: Nurse Practitioner

## 2024-01-04 ENCOUNTER — Ambulatory Visit: Admitting: Nurse Practitioner

## 2024-01-04 VITALS — BP 125/82 | HR 80 | Temp 98.9°F | Resp 15 | Ht 68.19 in | Wt 315.0 lb

## 2024-01-04 DIAGNOSIS — E538 Deficiency of other specified B group vitamins: Secondary | ICD-10-CM | POA: Diagnosis not present

## 2024-01-04 DIAGNOSIS — Z13220 Encounter for screening for lipoid disorders: Secondary | ICD-10-CM | POA: Diagnosis not present

## 2024-01-04 DIAGNOSIS — F418 Other specified anxiety disorders: Secondary | ICD-10-CM | POA: Diagnosis not present

## 2024-01-04 DIAGNOSIS — Z6841 Body Mass Index (BMI) 40.0 and over, adult: Secondary | ICD-10-CM

## 2024-01-04 DIAGNOSIS — J4520 Mild intermittent asthma, uncomplicated: Secondary | ICD-10-CM

## 2024-01-04 DIAGNOSIS — G629 Polyneuropathy, unspecified: Secondary | ICD-10-CM | POA: Diagnosis not present

## 2024-01-04 DIAGNOSIS — G4733 Obstructive sleep apnea (adult) (pediatric): Secondary | ICD-10-CM

## 2024-01-04 DIAGNOSIS — Z136 Encounter for screening for cardiovascular disorders: Secondary | ICD-10-CM | POA: Diagnosis not present

## 2024-01-04 DIAGNOSIS — F331 Major depressive disorder, recurrent, moderate: Secondary | ICD-10-CM

## 2024-01-04 DIAGNOSIS — E66813 Obesity, class 3: Secondary | ICD-10-CM | POA: Diagnosis not present

## 2024-01-04 DIAGNOSIS — E890 Postprocedural hypothyroidism: Secondary | ICD-10-CM | POA: Diagnosis not present

## 2024-01-04 DIAGNOSIS — E559 Vitamin D deficiency, unspecified: Secondary | ICD-10-CM | POA: Diagnosis not present

## 2024-01-04 DIAGNOSIS — Z23 Encounter for immunization: Secondary | ICD-10-CM

## 2024-01-04 DIAGNOSIS — Z1231 Encounter for screening mammogram for malignant neoplasm of breast: Secondary | ICD-10-CM

## 2024-01-04 NOTE — Assessment & Plan Note (Signed)
 Chronic and ongoing due to thyroid  cancer and removal on 04/27/22.  Will continue current Levothyroxine  dosing and adjust as needed.  Check labs today due to some increased fatigue.

## 2024-01-04 NOTE — Assessment & Plan Note (Signed)
 Chronic, ongoing.  Continue injections of B12 in office, labs today.  Will provide injection today, as has been some time since she had one and may benefit her fatigue.

## 2024-01-04 NOTE — Assessment & Plan Note (Signed)
 Refer to depression plan of care.

## 2024-01-04 NOTE — Assessment & Plan Note (Signed)
 Stable at present, using inhalers only as needed.  Uses Breo more often when pollen present.  Continue this regimen and adjust as needed.

## 2024-01-04 NOTE — Assessment & Plan Note (Signed)
 Chronic, ongoing.  Would benefit repeat sleep study in future, as suspect her sleep apnea is not helping her sleep pattern.

## 2024-01-04 NOTE — Assessment & Plan Note (Signed)
 BMI 47.63.  In past she lost significant weight with Wegovy , which offered benefit to her chronic pain.  Continue Topamax and Metformin  500 MG BID to see if benefit to weight loss. Educated her on this.  Recommended eating smaller high protein, low fat meals more frequently and exercising 30 mins a day 5 times a week with a goal of 10-15lb weight loss in the next 3 months. Patient voiced their understanding and motivation to adhere to these recommendations.

## 2024-01-04 NOTE — Assessment & Plan Note (Signed)
 Chronic, ongoing, continue supplement. Obtain labs today to recheck level.

## 2024-01-04 NOTE — Progress Notes (Signed)
 BP 125/82 (BP Location: Right Arm, Patient Position: Sitting, Cuff Size: Large)   Pulse 80   Temp 98.9 F (37.2 C) (Oral)   Resp 15   Ht 5' 8.19 (1.732 m)   Wt (!) 315 lb (142.9 kg)   LMP 02/09/2017 (Within Weeks)   SpO2 96%   BMI 47.63 kg/m    Subjective:    Patient ID: Cathy Mitchell, female    DOB: 03/09/1966, 58 y.o.   MRN: [id number]  HPI: Cathy Mitchell is a 58 y.o. female  Chief Complaint  Patient presents with   Anxiety/Depression    High on the depression scale. Anxiety is doing ok.    Asthma    Doing ok now. Has her inhaler for emergencies and seems to be working well for her. A bit winded yesterday and tried recovery before using inhaler. Often can resolve without using inhaler unless during allergy season.    Hypothyroidism    Feeling more tired then normal. Is tired of being tired.    FIBROMYALGIA/TREMOR Saw [facility] neurology team for tremor and fibromyalgia in past, functional movement disorder clinic. Saw last 05/25/22, to return as needed. Takes Lyrica  50 MG BID, tried Gabapentin  in past without benefit.  Lyrica  continues to work really well.  MRI performed on 12/11/20 noted chronic small vessel disease. Takes B12 and Vitamin D  supplements for history of low levels.  Has not had B12 shot in long while. Pain status: ongoing and some exacerbation with weight gain recently Satisfied with current treatment?: no Medication side effects: no Medication compliance: good compliance Quality: dull, aching, and burning Current pain level: mild Previous pain level: mild Aggravating factors: lifting and movement Alleviating factors: Gabapentin  and Alpha Lipoic Acid Previous pain specialty evaluation: no Non-narcotic analgesic meds: no Narcotic contract:no Treatments attempted: Gabapentin  and PT, as above  ASTHMA Using Breo (takes a needed) and Albuterol  as needed with benefit. Asthma status: stable Satisfied with current treatment?: yes Albuterol /rescue inhaler  frequency: no use recently Dyspnea frequency: yesterday had a little Wheezing frequency: no Cough frequency: no Nocturnal symptom frequency: no Limitation of activity: no Current upper respiratory symptoms: no Triggers: pollen Home peak flows: none Last Spirometry:  Failed/intolerant to following asthma meds:  Asthma meds in past: 08/31/23 Aerochamber/spacer use: no Visits to ER or Urgent Care in past year: no Pneumovax: Up to Date Influenza: Up to Date   HYPOTHYROIDISM Diagnosed with thyroid  cancer in December 2023.  Thyroid  removed on 04/27/22 by Dr. Jessee at [facility].  Takes Levothyroxine  175 MCG daily.  Saw cardiology on 04/26/23 for PVCs. Continues Cardizem  120 MG TID which offers benefit.  To return annually.   History: Did follow with [facility] weight management, last visit was 10/26/22.  Took Zepbound 7.5 MG weekly at one time until stopped being covered.  Had success with GLP1s.  Currently takes Metformin  500 MG BID. Thyroid  control status:stable Satisfied with current treatment? yes Medication side effects: no Medication compliance: good compliance Etiology of hypothyroidism: surgical removal Recent dose adjustment:no Fatigue: yes -- has OSA per patient, was told she was a challenging sleep study in past because she does not sleep Cold intolerance: no Heat intolerance: no Weight gain: yes -- this does frustrate her as has presented since thyroid  removal, was on GLP1 prior which offered benefit Weight loss: no Constipation: sometimes Diarrhea/loose stools: sometimes Palpitations: no Lower extremity edema: no Anxiety/depressed mood: yes   DEPRESSION/PTSD Taking Klonpin, Celexa , Abilify , and Topamax.  Follows with psychiatry and therapy, follows with psychiatry every  3-4 months. Has some depression at present, has been eating her way through it.  Has been pushed to her limits. Duration: stable Anxious mood: no Excessive worrying: yes, worried about her son who hurt his  back Irritability: no Sweating: no Nausea: no Palpitations:no Hyperventilation: no Panic attacks: no Agoraphobia: no  Obscessions/compulsions: no Depressed mood: yes    January 10, 2024    1:58 PM 11/16/2023    9:57 AM 11/02/2023    8:47 AM 07/28/2023    1:25 PM 05/03/2023   10:36 AM  Depression screen PHQ 2/9  Decreased Interest 0 0 0 0 0  Down, Depressed, Hopeless 0 0 0 0 2  PHQ - 2 Score 0 0 0 0 2  Altered sleeping 3 0 2 3 2   Tired, decreased energy 0 1 0 0 1  Change in appetite 0 0 0 3 1  Feeling bad or failure about yourself  0 0 0 0 3  Trouble concentrating 3 0 0 0 3  Moving slowly or fidgety/restless 0 0 0 0 0  Suicidal thoughts 0 0 0 0 0  PHQ-9 Score 6 1 2 6 12   Difficult doing work/chores   Not difficult at all Not difficult at all Somewhat difficult  Anhedonia: no Weight changes: no Insomnia: no Hypersomnia: no Fatigue/loss of energy: yes Feelings of worthlessness: no Feelings of guilt:  no Impaired concentration/indecisiveness: on occasion Suicidal ideations: no  Crying spells: yes Recent Stressors/Life Changes: yes   Relationship problems: no   Family stress: no   Financial stress: no    Job stress: no    Recent death/loss: no    01/10/24    1:58 PM 11/16/2023    9:58 AM 11/02/2023    8:48 AM 07/28/2023    1:25 PM  GAD 7 : Generalized Anxiety Score  Nervous, Anxious, on Edge 0 0 0 0  Control/stop worrying 0 0 0 0  Worry too much - different things 0 0 0 0  Trouble relaxing 0 0 0 0  Restless 0 0 0 3  Easily annoyed or irritable 0 0 0 0  Afraid - awful might happen 0 0 0 0  Total GAD 7 Score 0 0 0 3  Anxiety Difficulty Not difficult at all  Not difficult at all Not difficult at all   Relevant past medical, surgical, family and social history reviewed and updated as indicated. Interim medical history since our last visit reviewed. Allergies and medications reviewed and updated.  Review of Systems  Constitutional:  Negative for activity change, appetite  change, diaphoresis, fatigue and unexpected weight change.  Respiratory: Negative.    Cardiovascular: Negative.   Gastrointestinal: Negative.   Endocrine: Negative for cold intolerance and heat intolerance.  Neurological: Negative.   Psychiatric/Behavioral:  Positive for decreased concentration and sleep disturbance. Negative for self-injury and suicidal ideas. The patient is nervous/anxious.    Per HPI unless specifically indicated above     Objective:    BP 125/82 (BP Location: Right Arm, Patient Position: Sitting, Cuff Size: Large)   Pulse 80   Temp 98.9 F (37.2 C) (Oral)   Resp 15   Ht 5' 8.19 (1.732 m)   Wt (!) 315 lb (142.9 kg)   LMP 02/09/2017 (Within Weeks)   SpO2 96%   BMI 47.63 kg/m   Wt Readings from Last 3 Encounters:  Jan 10, 2024 (!) 315 lb (142.9 kg)  11/16/23 (!) 311 lb 3.2 oz (141.2 kg)  11/02/23 (!) 308 lb 3.2 oz (139.8 kg)  Physical Exam Vitals and nursing note reviewed.  Constitutional:      General: She is awake. She is not in acute distress.    Appearance: She is well-developed and well-groomed. She is morbidly obese. She is not ill-appearing.  HENT:     Head: Normocephalic.     Right Ear: Hearing normal.     Left Ear: Hearing normal.  Eyes:     General: Lids are normal.        Right eye: No discharge.        Left eye: No discharge.     Conjunctiva/sclera: Conjunctivae normal.     Pupils: Pupils are equal, round, and reactive to light.  Neck:     Thyroid : No thyromegaly.     Vascular: No carotid bruit or JVD.  Cardiovascular:     Rate and Rhythm: Normal rate and regular rhythm.     Heart sounds: Normal heart sounds. No murmur heard.    No gallop.  Pulmonary:     Effort: Pulmonary effort is normal. No accessory muscle usage or respiratory distress.     Breath sounds: Normal breath sounds.  Abdominal:     General: Bowel sounds are normal.     Palpations: Abdomen is soft.  Musculoskeletal:     Cervical back: Normal range of motion and neck  supple.     Right lower leg: No edema.     Left lower leg: No edema.  Lymphadenopathy:     Cervical: No cervical adenopathy.  Skin:    General: Skin is warm and dry.  Neurological:     Mental Status: She is alert and oriented to person, place, and time.     Motor: Tremor present.     Gait: Gait is intact.     Deep Tendon Reflexes:     Reflex Scores:      Brachioradialis reflexes are 1+ on the right side and 1+ on the left side.      Patellar reflexes are 1+ on the right side and 1+ on the left side.    Comments: Bilateral upper extremity tremor L>R -- varies from resting to active tremor.  No rigidity noted or cogwheel.  Normal gait.    Psychiatric:        Attention and Perception: Attention normal.        Mood and Affect: Mood normal.        Speech: Speech normal.        Behavior: Behavior normal. Behavior is cooperative.        Thought Content: Thought content normal.    Results for orders placed or performed in visit on 11/02/23  CBC w/Diff   Collection Time: 11/02/23  9:22 AM  Result Value Ref Range   WBC 8.3 3.4 - 10.8 x10E3/uL   RBC 5.02 3.77 - 5.28 x10E6/uL   Hemoglobin 13.5 11.1 - 15.9 g/dL   Hematocrit 57.2 65.9 - 46.6 %   MCV 85 79 - 97 fL   MCH 26.9 26.6 - 33.0 pg   MCHC 31.6 31.5 - 35.7 g/dL   RDW 84.7 88.2 - 84.5 %   Platelets 429 150 - 450 x10E3/uL   Neutrophils 70 Not Estab. %   Lymphs 19 Not Estab. %   Monocytes 7 Not Estab. %   Eos 2 Not Estab. %   Basos 2 Not Estab. %   Neutrophils Absolute 5.8 1.4 - 7.0 x10E3/uL   Lymphocytes Absolute 1.6 0.7 - 3.1 x10E3/uL   Monocytes Absolute 0.5 0.1 -  0.9 x10E3/uL   EOS (ABSOLUTE) 0.2 0.0 - 0.4 x10E3/uL   Basophils Absolute 0.1 0.0 - 0.2 x10E3/uL   Immature Granulocytes 0 Not Estab. %   Immature Grans (Abs) 0.0 0.0 - 0.1 x10E3/uL  Basic Metabolic Panel (BMET)   Collection Time: 11/02/23  9:22 AM  Result Value Ref Range   Glucose 94 70 - 99 mg/dL   BUN 10 6 - 24 mg/dL   Creatinine, Ser 9.10 0.57 - 1.00 mg/dL    eGFR 76 >40 fO/fpw/8.26   BUN/Creatinine Ratio 11 9 - 23   Sodium 136 134 - 144 mmol/L   Potassium 4.4 3.5 - 5.2 mmol/L   Chloride 99 96 - 106 mmol/L   CO2 21 20 - 29 mmol/L   Calcium 9.3 8.7 - 10.2 mg/dL  Magnesium   Collection Time: 11/02/23  9:22 AM  Result Value Ref Range   Magnesium 2.2 1.6 - 2.3 mg/dL  TSH   Collection Time: 11/02/23  9:22 AM  Result Value Ref Range   TSH 0.769 0.450 - 4.500 uIU/mL  B Nat Peptide   Collection Time: 11/02/23  9:22 AM  Result Value Ref Range   BNP 8.2 0.0 - 100.0 pg/mL  D-Dimer, Quantitative   Collection Time: 11/02/23  9:22 AM  Result Value Ref Range   D-DIMER 0.49 0.00 - 0.49 mg/L FEU  Rapid Strep Screen ([facility] ONLY)   Collection Time: 11/02/23  9:27 AM   Specimen: Other   Other  Result Value Ref Range   Strep Gp A Ag, IA W/Reflex Negative Negative  Culture, Group A Strep   Collection Time: 11/02/23  9:27 AM   Other  Result Value Ref Range   Strep A Culture Negative   POC Covid19/Flu A&B Antigen   Collection Time: 11/02/23 10:07 AM  Result Value Ref Range   Influenza A Antigen, POC Negative Negative   Influenza B Antigen, POC Negative Negative   Covid Antigen, POC Negative Negative      Assessment & Plan:   Problem List Items Addressed This Visit       Respiratory   Obstructive sleep apnea   Chronic, ongoing.  Would benefit repeat sleep study in future, as suspect her sleep apnea is not helping her sleep pattern.      Asthma   Stable at present, using inhalers only as needed.  Uses Breo more often when pollen present.  Continue this regimen and adjust as needed.        Endocrine   Hypothyroid   Chronic and ongoing due to thyroid  cancer and removal on 04/27/22.  Will continue current Levothyroxine  dosing and adjust as needed.  Check labs today due to some increased fatigue.      Relevant Orders   T4, free   TSH     Nervous and Auditory   Small fiber neuropathy   Diagnosed May 2023.  Continue to  collaborate with neurology at [facility] as needed, recent notes and labs reviewed.        Other   Vitamin D  deficiency   Chronic, ongoing, continue supplement. Obtain labs today to recheck level.       Relevant Orders   VITAMIN D  25 Hydroxy (Vit-D Deficiency, Fractures)   Situational anxiety   Refer to depression plan of care.      Obesity   BMI 47.63.  In past she lost significant weight with [employer] , which offered benefit to her chronic pain.  Continue Topamax and Metformin  500 MG BID to  see if benefit to weight loss. Educated her on this.  Recommended eating smaller high protein, low fat meals more frequently and exercising 30 mins a day 5 times a week with a goal of 10-15lb weight loss in the next 3 months. Patient voiced their understanding and motivation to adhere to these recommendations.       Depression - Primary   Chronic, ongoing, currently followed by psychiatry -- [facility].  Denies SI/HI.  Continue current medication regimen as ordered by psychiatry and attempt to get notes next visit.  Discussed at length with her today.        B12 deficiency   Chronic, ongoing.  Continue injections of B12 in office, labs today.  Will provide injection today, as has been some time since she had one and may benefit her fatigue.      Relevant Orders   Vitamin B12   Other Visit Diagnoses       Encounter for lipid screening for cardiovascular disease       Lipid panel today.   Relevant Orders   Comprehensive metabolic panel with GFR   Lipid Panel w/o Chol/HDL Ratio     Pneumococcal vaccination given       PCV20 in office today, educated on this.   Relevant Orders   Pneumococcal conjugate vaccine 20-valent (Completed)     Flu vaccine need       Flu vaccine today, educated patient.   Relevant Orders   Flu vaccine trivalent PF, 6mos and older(Flulaval,Afluria,Fluarix,Fluzone) (Completed)        Follow up plan: Return in about 6 months (around 07/04/2024) for Depression,  ANXIETY, Asthma, Fibromyalgia, Hypothyroid (physical labs).

## 2024-01-04 NOTE — Assessment & Plan Note (Signed)
Chronic, ongoing, currently followed by psychiatry -- [facility].  Denies SI/HI.  Continue current medication regimen as ordered by psychiatry and attempt to get notes next visit.  Discussed at length with her today.

## 2024-01-04 NOTE — Assessment & Plan Note (Signed)
Diagnosed May 2023.   Continue to collaborate with neurology at [facility] as needed, recent notes and labs reviewed.

## 2024-01-05 ENCOUNTER — Encounter

## 2024-01-05 ENCOUNTER — Ambulatory Visit: Payer: Self-pay | Admitting: Nurse Practitioner

## 2024-01-05 ENCOUNTER — Encounter: Payer: Self-pay | Admitting: Nurse Practitioner

## 2024-01-05 DIAGNOSIS — E890 Postprocedural hypothyroidism: Secondary | ICD-10-CM

## 2024-01-05 DIAGNOSIS — R7301 Impaired fasting glucose: Secondary | ICD-10-CM

## 2024-01-05 LAB — COMPREHENSIVE METABOLIC PANEL WITH GFR
ALT: 13 IU/L (ref 0–32)
AST: 13 IU/L (ref 0–40)
Albumin: 4.3 g/dL (ref 3.8–4.9)
Alkaline Phosphatase: 111 IU/L (ref 49–135)
BUN/Creatinine Ratio: 11 (ref 9–23)
BUN: 9 mg/dL (ref 6–24)
Bilirubin Total: <0.2 mg/dL (ref 0.0–1.2)
CO2: 21 mmol/L (ref 20–29)
Calcium: 9 mg/dL (ref 8.7–10.2)
Chloride: 104 mmol/L (ref 96–106)
Creatinine, Ser: 0.83 mg/dL (ref 0.57–1.00)
Globulin, Total: 2.8 g/dL (ref 1.5–4.5)
Glucose: 132 mg/dL — ABNORMAL HIGH (ref 70–99)
Potassium: 3.9 mmol/L (ref 3.5–5.2)
Sodium: 139 mmol/L (ref 134–144)
Total Protein: 7.1 g/dL (ref 6.0–8.5)
eGFR: 83 mL/min/1.73 (ref >59)

## 2024-01-05 LAB — TSH: TSH: 0.315 u[IU]/mL — ABNORMAL LOW (ref 0.450–4.500)

## 2024-01-05 LAB — LIPID PANEL W/O CHOL/HDL RATIO
Cholesterol, Total: 156 mg/dL (ref 100–199)
HDL: 56 mg/dL (ref >39)
LDL Chol Calc (NIH): 82 mg/dL (ref 0–99)
Triglycerides: 100 mg/dL (ref 0–149)
VLDL Cholesterol Cal: 18 mg/dL (ref 5–40)

## 2024-01-05 LAB — VITAMIN B12: Vitamin B-12: 586 pg/mL (ref 232–1245)

## 2024-01-05 LAB — VITAMIN D 25 HYDROXY (VIT D DEFICIENCY, FRACTURES): Vit D, 25-Hydroxy: 41.3 ng/mL (ref 30.0–100.0)

## 2024-01-05 LAB — T4, FREE: Free T4: 1.38 ng/dL (ref 0.82–1.77)

## 2024-01-05 NOTE — Progress Notes (Signed)
 Called patient to get her scheduled for labs, Mailbox is full.

## 2024-01-05 NOTE — Progress Notes (Signed)
 Contacted via MyChart -- needs lab only visit in 4 weeks  Good afternoon Cathy Mitchell, your labs have returned: - Thyroid  labs show mildly low TSH and normal Free T4.  Would like to recheck this outpatient in 4 weeks. Maintain current Levothyroxine  dose at current dosing. - Kidney function, creatinine and eGFR, remains normal, as is liver function, AST and ALT.  - Vitamin B12 and D are normal.  Any questions? Keep being stellar!!  Thank you for allowing me to participate in your care.  I appreciate you. Kindest regards, Jabari Swoveland

## 2024-01-06 NOTE — Progress Notes (Signed)
 Scheduled

## 2024-01-28 ENCOUNTER — Telehealth: Payer: Self-pay

## 2024-01-28 ENCOUNTER — Other Ambulatory Visit (HOSPITAL_COMMUNITY): Payer: Self-pay

## 2024-01-28 NOTE — Telephone Encounter (Signed)
 Pharmacy Patient Advocate Encounter  Received notification from [facility] that Prior Authorization for Pregabalin  50MG  capsules  has been APPROVED  to 10.31.26. Ran test claim, Copay is $4.41. This test claim was processed through [facility]- copay amounts may vary at other pharmacies due to pharmacy/plan contracts, or as the patient moves through the different stages of their insurance plan.   PA #/Case ID/Reference #: [id number]

## 2024-01-31 ENCOUNTER — Other Ambulatory Visit

## 2024-01-31 ENCOUNTER — Other Ambulatory Visit (HOSPITAL_COMMUNITY): Payer: Self-pay

## 2024-01-31 DIAGNOSIS — R7301 Impaired fasting glucose: Secondary | ICD-10-CM

## 2024-01-31 DIAGNOSIS — E890 Postprocedural hypothyroidism: Secondary | ICD-10-CM

## 2024-01-31 LAB — BAYER DCA HB A1C WAIVED: HB A1C (BAYER DCA - WAIVED): 5.1 % (ref 4.8–5.6)

## 2024-02-01 ENCOUNTER — Ambulatory Visit: Payer: Self-pay | Admitting: Nurse Practitioner

## 2024-02-01 LAB — T4, FREE: Free T4: 1.4 ng/dL (ref 0.82–1.77)

## 2024-02-01 LAB — TSH: TSH: 0.367 u[IU]/mL — ABNORMAL LOW (ref 0.450–4.500)

## 2024-02-01 MED ORDER — LEVOTHYROXINE SODIUM 150 MCG PO TABS: 150.0000 ug | ORAL_TABLET | Freq: Every day | ORAL | 5 refills | Status: AC

## 2024-02-01 NOTE — Progress Notes (Signed)
 Contacted via MyChart -- need outpatient lab only visit in 6 weeks please  Good afternoon Cathy Mitchell, your TSH remains a little low. I am going to reduce your Levothyroxine  to 150 MCG and we will plan on rechecking levels outpatient in 6 weeks again.  Any questions? Keep being amazing!!  Thank you for allowing me to participate in your care.  I appreciate you. Kindest regards, Artist Bloom

## 2024-02-02 ENCOUNTER — Encounter: Payer: Self-pay | Admitting: Nurse Practitioner

## 2024-02-02 NOTE — Progress Notes (Signed)
 Called patient to schedule lab appt but mailbox is full.

## 2024-02-04 ENCOUNTER — Other Ambulatory Visit (HOSPITAL_COMMUNITY): Payer: Self-pay

## 2024-02-09 NOTE — Progress Notes (Signed)
 Scheduled

## 2024-02-28 ENCOUNTER — Ambulatory Visit: Admitting: Urology

## 2024-02-28 VITALS — BP 120/78 | HR 85 | Ht 68.0 in | Wt 319.4 lb

## 2024-02-28 DIAGNOSIS — N3281 Overactive bladder: Secondary | ICD-10-CM | POA: Diagnosis not present

## 2024-02-28 DIAGNOSIS — N3941 Urge incontinence: Secondary | ICD-10-CM

## 2024-02-28 MED ORDER — OXYBUTYNIN CHLORIDE ER 10 MG PO TB24: 10.0000 mg | ORAL_TABLET | Freq: Every day | ORAL | 3 refills | Status: AC

## 2024-02-28 NOTE — Progress Notes (Signed)
 02/28/2024 1:37 PM   Cathy Mitchell 09-26-1965 [id number]  Referring provider: Valerio Melanie DASEN, NP [address] [place],  [place] [address]  No chief complaint on file.   HPI: SN: Patient has medical comorbidities, a sling placed in 2009, pulling in her groin and urinary incontinence.  Residual was 0 mL   I was consulted to assess the patient's urinary incontinence.  Sometimes she has urge incontinence during the day but does not wear a pad.  She has no stress incontinence with coughing sneezing bending or lifting.  She has no bedwetting but can have foot on the floor syndrome but this is not every night.  Because of this she will wear a pad for confidence.  She thinks her sling is no longer working   She time voids every 1 hour to try to stay dry and cannot hold it for 2 hours.  She says she drinks a lot of fluid.  She voids every 2 hours at night and I believe has some ankle edema.  She reports a good flow   She has had a previous sling.  She has not had a hysterectomy.     On pelvic examination she had a very well supported bladder neck. I cannot feel or see any mesh. No prolapse. No stress incontinence.    The patient has mild urge incontinence especially at night likely associated with a nocturnal diuresis. She is concerned about her sling. She has frequency but drinks a lot of fluids. Role of medication and safety cystoscopy discussed. Role of urodynamics discussed. Patient understood the role of urodynamics but we will hold off them for now. She will come back on Gemtesa  samples and prescription for cystoscopy in about 6 weeks    Patient was significantly better on Gemtesa  but insurance did not want to pay for it.  She leaked a few times.  Frequency stable and clinically not infected Cystoscopy: Patient underwent flexible cystoscopy.  Bladder mucosa and trigone were normal.  No foreign body in bladder or urethra.  No cystitis No stress incontinence with significant cough after  cystoscopy     Reevaluate in 6 weeks on oxybutynin  ER 10 mg 3 x 11. Try to find medication that works is affordable    Today Urgency incontinence much better.  Very pleased.  No infections.  Frequency improved.  Almost completely continent most   PMH: Past Medical History:  Diagnosis Date   Anxiety    Arthritis    Back pain    Dementia (HCC)    Depression    Fibromyalgia    Food allergy    [place]   Hypertension    no longer has hypertension   Joint pain    Kidney problem    Neuromuscular disorder (HCC)    L-sided tremors   Osteoarthritis    Other fatigue    Rheumatoid arthritis (HCC)    Sleep apnea    does not use C-PAP    Surgical History: Past Surgical History:  Procedure Laterality Date   CARPAL TUNNEL RELEASE Bilateral 2007   DILATION AND CURETTAGE OF UTERUS     GANGLION CYST EXCISION Bilateral    GANGLION CYST EXCISION Right 10/08/2015   Procedure: REMOVAL GANGLION OF WRIST;  Surgeon: Ozell Flake, MD;  Location: [facility];  Service: Orthopedics;  Laterality: Right;   GASTRIC BYPASS     HAND SURGERY Bilateral Resection   HIP SURGERY     HYSTEROSCOPY WITH D & C N/A 05/24/2017  Procedure: DILATATION AND CURETTAGE /HYSTEROSCOPY;  Surgeon: Defrancesco, Gladis LABOR, MD;  Location: [facility];  Service: Gynecology;  Laterality: N/A;   INCONTINENCE SURGERY  2009   LEEP N/A 05/24/2017   Procedure: LOOP ELECTROSURGICAL EXCISION PROCEDURE (LEEP);  Surgeon: Kathe Gladis LABOR, MD;  Location: [facility];  Service: Gynecology;  Laterality: N/A;   TUBAL LIGATION      Home Medications:  Allergies as of 02/28/2024       Reactions   Gabapentin  Other (See Comments)   Numbness and tingling increases   Oxycodone-acetaminophen  Itching, Other (See Comments)   Reaction:  Dizziness and blurred vision    Other Other (See Comments)   Patient states she is allergic to all narcotics   Percocet [oxycodone-acetaminophen ] Nausea And Vomiting, Other (See Comments)   Reaction:  Dizziness  and blurred vision  Reaction:  Dizziness and blurred vision         Medication List        Accurate as of February 28, 2024  1:37 PM. If you have any questions, ask your nurse or doctor.          acetaminophen  500 MG tablet Commonly known as: TYLENOL  Take 1,500 mg by mouth daily as needed for mild pain.   albuterol  108 (90 Base) MCG/ACT inhaler Commonly known as: VENTOLIN  HFA TAKE 2 PUFFS BY MOUTH EVERY 6 HOURS AS NEEDED FOR WHEEZE OR SHORTNESS OF BREATH   ARIPiprazole  5 MG tablet Commonly known as: Abilify  Take 1 tablet (5 mg total) by mouth daily.   Calcium High Potency/Vitamin D  600-5 MG-MCG Tabs Generic drug: Calcium Carb-Cholecalciferol  Take by mouth.   celecoxib  100 MG capsule Commonly known as: CELEBREX  TAKE 1 CAPSULE (100 MG TOTAL) BY MOUTH DAILY AS NEEDED FOR MODERATE PAIN.   citalopram  40 MG tablet Commonly known as: CELEXA  Take 1 tablet (40 mg total) by mouth daily.   clonazePAM 0.5 MG tablet Commonly known as: KLONOPIN Take 0.5 mg by mouth 2 (two) times daily as needed for anxiety.   dicyclomine  20 MG tablet Commonly known as: BENTYL  Take 1 tablet (20 mg total) by mouth 3 (three) times daily. What changed:  when to take this reasons to take this   diltiazem  120 MG tablet Commonly known as: CARDIZEM  Take 1 tablet (120 mg total) by mouth 3 (three) times daily.   fluticasone  50 MCG/ACT nasal spray Commonly known as: FLONASE  Place 2 sprays into both nostrils daily.   fluticasone  furoate-vilanterol 100-25 MCG/ACT Aepb Commonly known as: BREO ELLIPTA  Inhale 1 puff into the lungs daily.   levocetirizine 5 MG tablet Commonly known as: XYZAL  Take 1 tablet (5 mg total) by mouth every evening.   levothyroxine  150 MCG tablet Commonly known as: SYNTHROID  Take 1 tablet (150 mcg total) by mouth daily before breakfast.   MAGNESIUM PO Take by mouth.   meclizine  12.5 MG tablet Commonly known as: ANTIVERT  Take 1 tablet (12.5 mg total) by mouth 3  (three) times daily as needed for dizziness.   metFORMIN  500 MG tablet Commonly known as: GLUCOPHAGE  Take 1 tablet (500 mg total) by mouth 2 (two) times daily with a meal.   mometasone 0.1 % lotion Commonly known as: ELOCON 2 (two) times daily.   oxybutynin  10 MG 24 hr tablet Commonly known as: DITROPAN -XL Take 1 tablet (10 mg total) by mouth daily.   pregabalin  50 MG capsule Commonly known as: Lyrica  Take 1 capsule (50 mg total) by mouth 2 (two) times daily.   topiramate 200 MG tablet Commonly known  as: TOPAMAX Take 200 mg by mouth at bedtime.   Vitamin D  (Ergocalciferol ) 1.25 MG (50000 UNIT) Caps capsule Commonly known as: DRISDOL  TAKE 1 CAPSULE BY MOUTH ONE TIME PER WEEK        Allergies:  Allergies  Allergen Reactions   Gabapentin  Other (See Comments)    Numbness and tingling increases   Oxycodone-Acetaminophen  Itching and Other (See Comments)    Reaction:  Dizziness and blurred vision    Other Other (See Comments)    Patient states she is allergic to all narcotics   Percocet [email or web address] ] Nausea And Vomiting and Other (See Comments)    Reaction:  Dizziness and blurred vision  Reaction:  Dizziness and blurred vision     Family History: Family History  Problem Relation Age of Onset   Hypertension Mother    Depression Mother    Anxiety disorder Mother    Obesity Mother    COPD Father    Hypertension Father    Depression Father    Anxiety disorder Father    Obesity Father    Thyroid  disease Daughter    Heart disease Maternal Grandfather    Diabetes Paternal Grandfather    Heart disease Paternal Grandfather    Eczema Son    Breast cancer Neg Hx     Social History:  reports that she quit smoking about 9 years ago. Her smoking use included cigarettes. She started smoking about 38 years ago. She has a 14.5 pack-year smoking history. She has been exposed to tobacco smoke. She has never used smokeless tobacco. She reports that she does not drink  alcohol and does not use drugs.  ROS:                                        Physical Exam: LMP 02/09/2017 (Within Weeks)   Constitutional:  Alert and oriented, No acute distress. HEENT: [place] AT, moist mucus membranes.  Trachea midline, no masses.   Laboratory Data: Lab Results  Component Value Date   WBC 8.3 11/02/2023   HGB 13.5 11/02/2023   HCT 42.7 11/02/2023   MCV 85 11/02/2023   PLT 429 11/02/2023    Lab Results  Component Value Date   CREATININE 0.83 01/04/2024    No results found for: PSA  No results found for: TESTOSTERONE  Lab Results  Component Value Date   HGBA1C 5.1 01/31/2024    Urinalysis    Component Value Date/Time   COLORURINE YELLOW 02/02/2023 1313   APPEARANCEUR Clear 06/14/2023 1342   LABSPEC 1.015 02/02/2023 1313   PHURINE 7.0 02/02/2023 1313   GLUCOSEU Negative 06/14/2023 1342   HGBUR NEGATIVE 02/02/2023 1313   BILIRUBINUR Negative 06/14/2023 1342   KETONESUR NEGATIVE 02/02/2023 1313   PROTEINUR Negative 06/14/2023 1342   PROTEINUR NEGATIVE 02/02/2023 1313   NITRITE Negative 06/14/2023 1342   NITRITE NEGATIVE 02/02/2023 1313   LEUKOCYTESUR Negative 06/14/2023 1342   LEUKOCYTESUR NEGATIVE 02/02/2023 1313    Pertinent Imaging:   Assessment & Plan: 19 x 3 oxybutynin  sent to pharmacy and I will see in 1 year  1. Urgency incontinence (Primary)  - Urinalysis, Complete  2. OAB (overactive bladder)  - Urinalysis, Complete   No follow-ups on file.  Glendia DELENA Elizabeth, MD  [facility] [address], [address] [place], [place] [address] [phone]

## 2024-03-06 ENCOUNTER — Ambulatory Visit: Payer: Self-pay

## 2024-03-06 ENCOUNTER — Encounter: Payer: Self-pay | Admitting: Nurse Practitioner

## 2024-03-06 ENCOUNTER — Ambulatory Visit: Admitting: Nurse Practitioner

## 2024-03-06 VITALS — BP 116/77 | HR 73 | Temp 97.5°F | Ht 67.99 in | Wt 319.0 lb

## 2024-03-06 DIAGNOSIS — M25472 Effusion, left ankle: Secondary | ICD-10-CM | POA: Diagnosis not present

## 2024-03-06 NOTE — Telephone Encounter (Signed)
 Noted

## 2024-03-06 NOTE — Telephone Encounter (Signed)
 FYI Only or Action Required?: FYI only for provider: appointment scheduled on today.  Patient was last seen in primary care on 01/04/2024 by Valerio Melanie DASEN, NP.  Called Nurse Triage reporting Foot Pain.  Symptoms began several days ago.  Interventions attempted: OTC medications: tylenol , ibuprofen, ice, heat and Rest, hydration, or home remedies.  Symptoms are: unchanged.  Triage Disposition: See Physician Within 24 Hours  Patient/caregiver understands and will follow disposition?: Yes, will follow disposition  Copied from CRM #[id number]. Topic: Clinical - Red Word Triage >> Mar 06, 2024 10:48 AM [name] wrote: [name] that prompted transfer to Nurse Triage: left foot pain/ swollen- black/ blue in color Reason for Disposition  [1] MODERATE pain (e.g., interferes with normal activities, limping) AND [2] high-risk adult (e.g., age > 60 years, osteoporosis, chronic steroid use)  Answer Assessment - Initial Assessment Questions 1. MECHANISM: How did the injury happen? (e.g., twisting injury, direct blow)      Pt states she went to stand and felt a pop in her ankle and her ankle gave out 2. ONSET: When did the injury happen? (e.g., minutes or hours ago)      About 3 days ago 3. LOCATION: Where is the injury located?      L ankle 4. APPEARANCE of INJURY: What does the injury look like?      Bruising, swelling, 5. WEIGHT-BEARING: Can you put weight on that foot? Can you walk (four steps or more)?       Can bear weight, but states that her foot is in awkward position, states bending ankle causes pain 7. PAIN: Is there pain? If Yes, ask: How bad is the pain? What does it keep you from doing? (Scale 0-10; or none, mild, moderate, severe)     2, states that tylenol  and ibuprofen are helping 9. OTHER SYMPTOMS: Do you have any other symptoms?      Denies numbness, denies tingling at this time.   Pt is moving toes  Protocols used: Foot Injury-A-AH

## 2024-03-06 NOTE — Progress Notes (Signed)
 BP 116/77 (BP Location: Right Arm, Patient Position: Sitting, Cuff Size: Large)   Pulse 73   Temp (!) 97.5 F (36.4 C) (Oral)   Ht 5' 7.99 (1.727 m)   Wt (!) 319 lb (144.7 kg)   LMP 02/09/2017 (Within Weeks)   SpO2 96%   BMI 48.52 kg/m    Subjective:    Patient ID: Cathy Mitchell, female    DOB: [birth year], 58 y.o.   MRN: [id number]  HPI: Cathy Mitchell is a 58 y.o. female  Chief Complaint  Patient presents with   Ankle Injury    Patient stated she injured her ankle Friday around 11PM. She just stood up and that's it   Patient states she got up in the middle of the night to go to the bathroom and she heard a popping of her foot.  She is having swelling, bruising, pain of the outside of her ankle.  She is having to use a walker or shuffle her feet to get around.  She has been icing and elevating her foot.   Relevant past medical, surgical, family and social history reviewed and updated as indicated. Interim medical history since our last visit reviewed. Allergies and medications reviewed and updated.  Review of Systems  Musculoskeletal:        Left ankle pain, swelling and bruising    Per HPI unless specifically indicated above     Objective:    BP 116/77 (BP Location: Right Arm, Patient Position: Sitting, Cuff Size: Large)   Pulse 73   Temp (!) 97.5 F (36.4 C) (Oral)   Ht 5' 7.99 (1.727 m)   Wt (!) 319 lb (144.7 kg)   LMP 02/09/2017 (Within Weeks)   SpO2 96%   BMI 48.52 kg/m   Wt Readings from Last 3 Encounters:  03/06/24 (!) 319 lb (144.7 kg)  02/28/24 (!) 319 lb 6.4 oz (144.9 kg)  01/04/24 (!) 315 lb (142.9 kg)    Physical Exam Vitals and nursing note reviewed.  Constitutional:      General: She is not in acute distress.    Appearance: Normal appearance. She is normal weight. She is not ill-appearing, toxic-appearing or diaphoretic.  HENT:     Head: Normocephalic.     Right Ear: External ear normal.     Left Ear: External ear normal.     Nose: Nose  normal.     Mouth/Throat:     Mouth: Mucous membranes are moist.     Pharynx: Oropharynx is clear.  Eyes:     General:        Right eye: No discharge.        Left eye: No discharge.     Extraocular Movements: Extraocular movements intact.     Conjunctiva/sclera: Conjunctivae normal.     Pupils: Pupils are equal, round, and reactive to light.  Cardiovascular:     Rate and Rhythm: Normal rate and regular rhythm.     Heart sounds: No murmur heard. Pulmonary:     Effort: Pulmonary effort is normal. No respiratory distress.     Breath sounds: Normal breath sounds. No wheezing or rales.  Musculoskeletal:        General: Swelling, tenderness and signs of injury present.     Cervical back: Normal range of motion and neck supple.     Left lower leg: Edema present.  Skin:    General: Skin is warm and dry.     Capillary Refill: Capillary refill takes less than 2  seconds.  Neurological:     General: No focal deficit present.     Mental Status: She is alert and oriented to person, place, and time. Mental status is at baseline.  Psychiatric:        Mood and Affect: Mood normal.        Behavior: Behavior normal.        Thought Content: Thought content normal.        Judgment: Judgment normal.     Results for orders placed or performed in visit on 01/31/24  Bayer DCA Hb A1c Waived   Collection Time: 01/31/24  8:20 AM  Result Value Ref Range   HB A1C (BAYER DCA - WAIVED) 5.1 4.8 - 5.6 %  TSH   Collection Time: 01/31/24  8:20 AM  Result Value Ref Range   TSH 0.367 (L) 0.450 - 4.500 uIU/mL  T4, free   Collection Time: 01/31/24  8:20 AM  Result Value Ref Range   Free T4 1.40 0.82 - 1.77 ng/dL      Assessment & Plan:   Problem List Items Addressed This Visit   None Visit Diagnoses       Left ankle swelling    -  Primary   Appointment made for patient to see Orthopedics tomorrow.  Continue with Rest, Ice and elevated.        Follow up plan: No follow-ups on file.

## 2024-03-08 DIAGNOSIS — S93402A Sprain of unspecified ligament of left ankle, initial encounter: Secondary | ICD-10-CM | POA: Diagnosis not present

## 2024-03-13 ENCOUNTER — Other Ambulatory Visit

## 2024-03-17 ENCOUNTER — Other Ambulatory Visit: Payer: Self-pay | Admitting: Nurse Practitioner

## 2024-03-17 ENCOUNTER — Other Ambulatory Visit

## 2024-03-17 ENCOUNTER — Encounter: Payer: Self-pay | Admitting: Nurse Practitioner

## 2024-03-17 DIAGNOSIS — E890 Postprocedural hypothyroidism: Secondary | ICD-10-CM | POA: Diagnosis not present

## 2024-03-18 ENCOUNTER — Ambulatory Visit: Payer: Self-pay | Admitting: Nurse Practitioner

## 2024-03-18 LAB — TSH: TSH: 2.29 u[IU]/mL (ref 0.450–4.500)

## 2024-03-18 NOTE — Progress Notes (Signed)
 Contacted via MyChart  TSH now in normal range. Great news.  I would continue current Levothyroxine  dosing. Happy Holidays!!

## 2024-04-18 ENCOUNTER — Ambulatory Visit: Payer: Self-pay | Attending: Medical | Admitting: Medical

## 2024-04-18 ENCOUNTER — Encounter: Payer: Self-pay | Admitting: Medical

## 2024-04-18 VITALS — BP 124/78 | HR 65 | Ht 68.75 in | Wt 328.2 lb

## 2024-04-18 DIAGNOSIS — I493 Ventricular premature depolarization: Secondary | ICD-10-CM | POA: Diagnosis not present

## 2024-04-18 DIAGNOSIS — C73 Malignant neoplasm of thyroid gland: Secondary | ICD-10-CM

## 2024-04-18 DIAGNOSIS — G4733 Obstructive sleep apnea (adult) (pediatric): Secondary | ICD-10-CM

## 2024-04-18 MED ORDER — DILTIAZEM HCL 120 MG PO TABS: 120.0000 mg | ORAL_TABLET | Freq: Three times a day (TID) | ORAL | 3 refills | Status: AC

## 2024-04-18 NOTE — Patient Instructions (Signed)
 Medication Instructions:  Your physician recommends that you continue on your current medications as directed. Please refer to the Current Medication list given to you today.    *If you need a refill on your cardiac medications before your next appointment, please call your pharmacy*  Lab Work: No labs ordered today    Testing/Procedures: No test ordered today   Follow-Up: At [facility], you and your health needs are our priority.  As part of our continuing mission to provide you with exceptional heart care, our providers are all part of one team.  This team includes your primary Cardiologist (physician) and Advanced Practice Providers or APPs (Physician Assistants and Nurse Practitioners) who all work together to provide you with the care you need, when you need it.  Your next appointment:   1 year(s)  Provider:   Deatrice Cage, MD or Cadence Franchester, PA-C

## 2024-04-18 NOTE — Progress Notes (Signed)
 " Cardiology Office Note   Date:  04/18/2024  ID:  Kena, Limon Mar 15, 1966, MRN [id number] PCP: Valerio Melanie DASEN, NP  [facility] Providers Cardiologist:  Deatrice Cage, MD     History of Present Illness Cathy Mitchell is a 59 y.o. female with a hx of symptomatic PVCs, OSA not on CPAP, thyroid  cancer, gastric bypass surgery who presents for follow-up of PVCs.    Previous treadmill stress test in 2016 before gastric bypass surgery was normal. Holter monitor in December 2018 showed frequent PVCs with a total of 24,000 beats in 48 hours representing 12% burden.  Echo showed normal LV function with mild MR.  Her PVCs responded to diltiazem .  Most recent outpatient heart monitor 05/2021 showed improvement of PVCs to 2.9%.  Patient was last seen in December 2023 and overall doing much better on diltiazem  extended release 3 times a day.  Patient was last seen 03/2023 reporting issues with her weight since her thyroid  was taken out.  She was stable from a cardiac perspective.  Today, the patient reports she has been stable from a cardiac perspective. She denies chest pain, SOB, lower leg edema, lightheadedness, dizziness, palpitations. She has not been doing exercise due to severe ankle sprain. Before that she was doing chair exercises.   Studies Reviewed EKG Interpretation Date/Time:  Tuesday April 18 2024 14:02:08 EST Ventricular Rate:  65 PR Interval:  202 QRS Duration:  98 QT Interval:  396 QTC Calculation: 411 R Axis:   -18  Text Interpretation: Sinus rhythm Premature ventricular complexes Possible Left atrial enlargement Incomplete right bundle branch block Inferior infarct , age undetermined Cannot rule out Anterior infarct (cited on or before 26-Apr-2023) When compared with ECG of 26-Apr-2023 14:24, Premature ventricular complexes are no longer Present Abberant conduction is now Present Inferior infarct is now Present Nonspecific T wave abnormality has replaced inverted T  waves in Lateral leads Confirmed by Franchester, Delia Slatten ([id number]) on 04/18/2024 2:12:43 PM    Carotid US  2023 Summary:  Right Carotid: There is no evidence of stenosis in the right ICA.   Left Carotid: There is no evidence of stenosis in the left ICA.   Vertebrals:  Bilateral vertebral arteries demonstrate antegrade flow.  Subclavians: Right subclavian artery flow was disturbed. Normal flow               hemodynamics were seen in the left subclavian artery.    Heart monitor 2023 Patch Wear Time:  4 days and 13 hours (2023-08-02T05:43:16-0400 to 2023-08-06T19:27:21-0400)   Patient had a min HR of 54 bpm, max HR of 144 bpm, and avg HR of 74 bpm.  Predominant underlying rhythm was Sinus Rhythm.  3 Supraventricular Tachycardia runs occurred, the run with the fastest interval lasting 5 beats with a max rate of 130 bpm, the  longest lasting 8 beats with an avg rate of 102 bpm.  Occasional PVCs with a burden of 2.9%.   Echo 2018 Study Conclusions   - Left ventricle: The cavity size was normal. Systolic function was    normal. The estimated ejection fraction was in the range of 60%    to 65%. Wall motion was normal; there were no regional wall    motion abnormalities. Doppler parameters are consistent with    abnormal left ventricular relaxation (grade 1 diastolic    dysfunction).  - Mitral valve: There was mild regurgitation.  - Left atrium: The atrium was mildly dilated.  - Right ventricle: Systolic function was normal.  -  Pulmonary arteries: Systolic pressure was within the normal    range.   Impressions:   - Frequent PVCs noted.    Heart monitor 10/2021 Patch Wear Time:  4 days and 13 hours (2023-08-02T05:43:16-0400 to 2023-08-06T19:27:21-0400)   Patient had a min HR of 54 bpm, max HR of 144 bpm, and avg HR of 74 bpm.  Predominant underlying rhythm was Sinus Rhythm.  3 Supraventricular Tachycardia runs occurred, the run with the fastest interval lasting 5 beats with a max rate of  130 bpm, the  longest lasting 8 beats with an avg rate of 102 bpm.  Occasional PVCs with a burden of 2.9%.   Heart monitor 2022 Patch Wear Time:  14 days and 0 hours (2022-03-10T14:42:15-0500 to 2022-03-24T15:42:19-0400)   Patient had a min HR of 55 bpm, max HR of 148 bpm, and avg HR of 84 bpm. Predominant underlying rhythm was Sinus Rhythm.  4 Supraventricular Tachycardia runs occurred, the run with the fastest interval lasting 5 beats with a max rate of 148 bpm, the longest lasting 6 beats with an avg rate of 114 bpm. Frequent PVCs with a burden of 13.6%.   Multiple triggered events by the patient did not correlate with arrhythmia but some did correlate with PVCs.      Physical Exam VS:  BP 124/78 (BP Location: Right Arm, Patient Position: Sitting, Cuff Size: Large)   Pulse 65   Ht 5' 8.75 (1.746 m)   Wt (!) 328 lb 3.2 oz (148.9 kg)   LMP 02/09/2017   SpO2 98%   BMI 48.82 kg/m        Wt Readings from Last 3 Encounters:  04/18/24 (!) 328 lb 3.2 oz (148.9 kg)  03/06/24 (!) 319 lb (144.7 kg)  02/28/24 (!) 319 lb 6.4 oz (144.9 kg)    GEN: Well nourished, well developed in no acute distress NECK: No JVD; No carotid bruits CARDIAC: RRR, no murmurs, rubs, gallops RESPIRATORY:  Clear to auscultation without rales, wheezing or rhonchi  ABDOMEN: Soft, non-tender, non-distended EXTREMITIES:  No edema; No deformity   ASSESSMENT AND PLAN  PVCs She denies palpitations. EKG shows NSR with 1 PVC. Continue diltiazem  120mg  TID, I will send in refills today.   Thyroid  cancer  S/p thyroidectomy in 2024. She is on levothyroxine .   OSA Chronic, being followed by PCP. She cannot tolerate CPAP.        Dispo: Follow-up in 1 year  Signed, Aranza Geddes VEAR Fishman, PA-C   "

## 2024-07-10 ENCOUNTER — Ambulatory Visit: Admitting: Nurse Practitioner

## 2025-02-26 ENCOUNTER — Ambulatory Visit: Admitting: Urology
